# Patient Record
Sex: Male | Born: 1945 | Race: White | Hispanic: No | Marital: Single | Smoking: Never smoker
Health system: Southern US, Community
[De-identification: ages and names within clinical notes are randomized; demographics above are authoritative.]

## PROBLEM LIST (undated history)

## (undated) DIAGNOSIS — E785 Hyperlipidemia, unspecified: Secondary | ICD-10-CM

## (undated) DIAGNOSIS — I5040 Unspecified combined systolic (congestive) and diastolic (congestive) heart failure: Secondary | ICD-10-CM

## (undated) DIAGNOSIS — M1990 Unspecified osteoarthritis, unspecified site: Secondary | ICD-10-CM

## (undated) DIAGNOSIS — J189 Pneumonia, unspecified organism: Secondary | ICD-10-CM

## (undated) DIAGNOSIS — K219 Gastro-esophageal reflux disease without esophagitis: Secondary | ICD-10-CM

## (undated) HISTORY — PX: HERNIA REPAIR: SHX51

## (undated) HISTORY — DX: Hyperlipidemia, unspecified: E78.5

## (undated) HISTORY — DX: Unspecified combined systolic (congestive) and diastolic (congestive) heart failure: I50.40

## (undated) HISTORY — PX: TONSILLECTOMY: SUR1361

## (undated) HISTORY — PX: COLONOSCOPY: SHX174

---

## 2004-03-27 ENCOUNTER — Encounter: Admission: RE | Admit: 2004-03-27 | Discharge: 2004-04-26 | Payer: Self-pay | Admitting: Family Medicine

## 2004-06-07 ENCOUNTER — Ambulatory Visit (HOSPITAL_COMMUNITY): Admission: RE | Admit: 2004-06-07 | Discharge: 2004-06-07 | Payer: Self-pay | Admitting: Orthopaedic Surgery

## 2012-10-14 ENCOUNTER — Telehealth: Payer: Self-pay | Admitting: Physician Assistant

## 2012-10-15 NOTE — Telephone Encounter (Signed)
Patient wanted to review his lab results again.

## 2012-10-26 ENCOUNTER — Other Ambulatory Visit: Payer: Self-pay | Admitting: Physician Assistant

## 2012-11-18 ENCOUNTER — Other Ambulatory Visit: Payer: Self-pay | Admitting: Physician Assistant

## 2012-11-23 ENCOUNTER — Other Ambulatory Visit: Payer: Self-pay | Admitting: Physician Assistant

## 2013-04-07 ENCOUNTER — Ambulatory Visit (INDEPENDENT_AMBULATORY_CARE_PROVIDER_SITE_OTHER): Payer: Medicare Other | Admitting: Family Medicine

## 2013-04-07 ENCOUNTER — Encounter: Payer: Self-pay | Admitting: Family Medicine

## 2013-04-07 VITALS — BP 133/62 | HR 41 | Temp 97.1°F | Ht 68.0 in | Wt 186.0 lb

## 2013-04-07 DIAGNOSIS — N400 Enlarged prostate without lower urinary tract symptoms: Secondary | ICD-10-CM

## 2013-04-07 DIAGNOSIS — L259 Unspecified contact dermatitis, unspecified cause: Secondary | ICD-10-CM

## 2013-04-07 DIAGNOSIS — F329 Major depressive disorder, single episode, unspecified: Secondary | ICD-10-CM

## 2013-04-07 DIAGNOSIS — F32A Depression, unspecified: Secondary | ICD-10-CM

## 2013-04-07 DIAGNOSIS — Z23 Encounter for immunization: Secondary | ICD-10-CM

## 2013-04-07 DIAGNOSIS — F3289 Other specified depressive episodes: Secondary | ICD-10-CM

## 2013-04-07 DIAGNOSIS — E785 Hyperlipidemia, unspecified: Secondary | ICD-10-CM

## 2013-04-07 DIAGNOSIS — L309 Dermatitis, unspecified: Secondary | ICD-10-CM

## 2013-04-07 DIAGNOSIS — M129 Arthropathy, unspecified: Secondary | ICD-10-CM

## 2013-04-07 DIAGNOSIS — M1990 Unspecified osteoarthritis, unspecified site: Secondary | ICD-10-CM

## 2013-04-07 LAB — POCT CBC
Granulocyte percent: 59.6 %G (ref 37–80)
HCT, POC: 39.7 % — AB (ref 43.5–53.7)
Hemoglobin: 13.2 g/dL — AB (ref 14.1–18.1)
Lymph, poc: 1.8 (ref 0.6–3.4)
MCH, POC: 28.7 pg (ref 27–31.2)
MCHC: 33.1 g/dL (ref 31.8–35.4)
MCV: 86.7 fL (ref 80–97)
MPV: 7.4 fL (ref 0–99.8)
POC Granulocyte: 3.1 (ref 2–6.9)
POC LYMPH PERCENT: 32.4 %L (ref 10–50)
Platelet Count, POC: 149 10*3/uL (ref 142–424)
RBC: 4.6 M/uL — AB (ref 4.69–6.13)
RDW, POC: 14 %
WBC: 5.2 10*3/uL (ref 4.6–10.2)

## 2013-04-07 MED ORDER — TRAMADOL HCL 50 MG PO TABS: 50.0000 mg | ORAL_TABLET | Freq: Two times a day (BID) | ORAL | Status: DC

## 2013-04-07 MED ORDER — MELOXICAM 15 MG PO TABS: 15.0000 mg | ORAL_TABLET | Freq: Every day | ORAL | Status: DC

## 2013-04-07 MED ORDER — GABAPENTIN 300 MG PO CAPS: 300.0000 mg | ORAL_CAPSULE | Freq: Two times a day (BID) | ORAL | Status: DC

## 2013-04-07 MED ORDER — DOXAZOSIN MESYLATE 4 MG PO TABS: 4.0000 mg | ORAL_TABLET | Freq: Every day | ORAL | Status: DC

## 2013-04-07 MED ORDER — TAMSULOSIN HCL 0.4 MG PO CAPS: 0.4000 mg | ORAL_CAPSULE | Freq: Every day | ORAL | Status: DC

## 2013-04-07 MED ORDER — SERTRALINE HCL 50 MG PO TABS: 50.0000 mg | ORAL_TABLET | Freq: Every day | ORAL | Status: DC

## 2013-04-07 MED ORDER — COLESEVELAM HCL 625 MG PO TABS: 1250.0000 mg | ORAL_TABLET | Freq: Two times a day (BID) | ORAL | Status: DC

## 2013-04-07 MED ORDER — FLUTICASONE PROPIONATE 0.05 % EX CREA: TOPICAL_CREAM | Freq: Two times a day (BID) | CUTANEOUS | Status: DC

## 2013-04-07 MED ORDER — PRAVASTATIN SODIUM 40 MG PO TABS: 40.0000 mg | ORAL_TABLET | Freq: Every day | ORAL | Status: DC

## 2013-04-07 NOTE — Patient Instructions (Signed)

## 2013-04-07 NOTE — Progress Notes (Signed)
  Subjective:    Patient ID: Taylor Cross, male    DOB: 05-24-1946, 67 y.o.   MRN: [id number]  HPI This 67 y.o. male presents for evaluation of bph, hyperlipidemia, depression, arthritis, and Dermatitis.  He has no acute complaints.  He needs refills.  He is due for labs.   Review of Systems No chest pain, SOB, HA, dizziness, vision change, N/V, diarrhea, constipation, dysuria, urinary urgency or frequency, myalgias, arthralgias or rash.     Objective:   Physical Exam Vital signs noted  Well developed well nourished male.  HEENT - Head atraumatic Normocephalic                Eyes - PERRLA, Conjuctiva - clear Sclera- Clear EOMI                Ears - EAC's Wnl TM's Wnl Gross Hearing WNL                Nose - Nares patent                 Throat - oropharanx wnl Respiratory - Lungs CTA bilateral Cardiac - RRR S1 and S2 without murmur GI - Abdomen soft Nontender and bowel sounds active x 4 Extremities - No edema. Neuro - Grossly intact.       Assessment & Plan:  Dermatitis - Plan: fluticasone (CUTIVATE) 0.05 % cream  BPH (benign prostatic hyperplasia) - Plan: tamsulosin (FLOMAX) 0.4 MG CAPS capsule, doxazosin (CARDURA) 4 MG tablet  Depression - Plan: sertraline (ZOLOFT) 50 MG tablet  Arthritis - Plan: traMADol (ULTRAM) 50 MG tablet, meloxicam (MOBIC) 15 MG tablet, gabapentin (NEURONTIN) 300 MG capsule  Other and unspecified hyperlipidemia - Plan: colesevelam (WELCHOL) 625 MG tablet, pravastatin (PRAVACHOL) 40 MG tablet, POCT CBC, CMP14+EGFR, Lipid panel  Need for prophylactic vaccination against Streptococcus pneumoniae (pneumococcus) - Plan: Pneumococcal polysaccharide vaccine 23-valent greater than or equal to 2yo subcutaneous/IM

## 2013-04-08 LAB — CMP14+EGFR
ALT: 13 IU/L (ref 0–44)
AST: 15 IU/L (ref 0–40)
Albumin/Globulin Ratio: 2.3 (ref 1.1–2.5)
Albumin: 4.5 g/dL (ref 3.6–4.8)
Alkaline Phosphatase: 71 IU/L (ref 39–117)
BUN/Creatinine Ratio: 20 (ref 10–22)
BUN: 20 mg/dL (ref 8–27)
CO2: 22 mmol/L (ref 18–29)
Calcium: 9.3 mg/dL (ref 8.6–10.2)
Chloride: 102 mmol/L (ref 97–108)
Creatinine, Ser: 0.98 mg/dL (ref 0.76–1.27)
GFR calc Af Amer: 92 mL/min/{1.73_m2} (ref >59)
GFR calc non Af Amer: 79 mL/min/{1.73_m2} (ref >59)
Globulin, Total: 2 g/dL (ref 1.5–4.5)
Glucose: 94 mg/dL (ref 65–99)
Potassium: 4.5 mmol/L (ref 3.5–5.2)
Sodium: 140 mmol/L (ref 134–144)
Total Bilirubin: 0.4 mg/dL (ref 0.0–1.2)
Total Protein: 6.5 g/dL (ref 6.0–8.5)

## 2013-04-08 LAB — LIPID PANEL
Chol/HDL Ratio: 2.7 ratio units (ref 0.0–5.0)
Cholesterol, Total: 153 mg/dL (ref 100–199)
HDL: 57 mg/dL (ref >39)
LDL Calculated: 80 mg/dL (ref 0–99)
Triglycerides: 79 mg/dL (ref 0–149)
VLDL Cholesterol Cal: 16 mg/dL (ref 5–40)

## 2013-04-15 ENCOUNTER — Telehealth: Payer: Self-pay | Admitting: Family Medicine

## 2013-04-15 NOTE — Telephone Encounter (Signed)
Several episodes of scant urination during the night.   He does feel that his bladder is full but he can't void. Has history of enlarged prostate. Currently on Septra for cellulitis from spider bite.  He prescribed this at [facility]. He questioned whether he should go to ED or come into the office.   Infection is unlikely since he is taking Septra but still possible. May need catheterization to drain bladder. Hospital is probably best since he hasn't voided normally since yesterday. Patient is agreeable to this.

## 2013-04-17 ENCOUNTER — Telehealth: Payer: Self-pay | Admitting: Family Medicine

## 2013-04-17 ENCOUNTER — Other Ambulatory Visit: Payer: Self-pay | Admitting: Family Medicine

## 2013-04-17 DIAGNOSIS — N400 Enlarged prostate without lower urinary tract symptoms: Secondary | ICD-10-CM

## 2013-04-17 MED ORDER — TAMSULOSIN HCL 0.4 MG PO CAPS: 0.8000 mg | ORAL_CAPSULE | Freq: Every day | ORAL | Status: DC

## 2013-04-17 NOTE — Telephone Encounter (Signed)
Pt requesting new rx of Tamsulosin. He wants to take it twice daily.

## 2013-04-17 NOTE — Telephone Encounter (Signed)
Bill please review and advise 

## 2013-04-17 NOTE — Telephone Encounter (Signed)
rx sent to pharmacy

## 2013-04-17 NOTE — Telephone Encounter (Signed)
done

## 2013-04-20 NOTE — Telephone Encounter (Signed)
Pt aware.

## 2013-04-30 ENCOUNTER — Ambulatory Visit (INDEPENDENT_AMBULATORY_CARE_PROVIDER_SITE_OTHER): Payer: Medicare Other | Admitting: Family Medicine

## 2013-04-30 VITALS — BP 146/69 | HR 42 | Temp 97.1°F | Wt 189.4 lb

## 2013-04-30 DIAGNOSIS — L0390 Cellulitis, unspecified: Secondary | ICD-10-CM

## 2013-04-30 DIAGNOSIS — L0291 Cutaneous abscess, unspecified: Secondary | ICD-10-CM

## 2013-04-30 MED ORDER — CEPHALEXIN 500 MG PO CAPS: 500.0000 mg | ORAL_CAPSULE | Freq: Four times a day (QID) | ORAL | Status: DC

## 2013-04-30 NOTE — Patient Instructions (Addendum)
Cellulitis Cellulitis is an infection of the skin and the tissue beneath it. The infected area is usually red and tender. Cellulitis occurs most often in the arms and lower legs.  CAUSES  Cellulitis is caused by bacteria that enter the skin through cracks or cuts in the skin. The most common types of bacteria that cause cellulitis are Staphylococcus and Streptococcus. SYMPTOMS   Redness and warmth.  Swelling.  Tenderness or pain.  Fever. DIAGNOSIS  Your caregiver can usually determine what is wrong based on a physical exam. Blood tests may also be done. TREATMENT  Treatment usually involves taking an antibiotic medicine. HOME CARE INSTRUCTIONS   Take your antibiotics as directed. Finish them even if you start to feel better.  Keep the infected arm or leg elevated to reduce swelling.  Apply a warm cloth to the affected area up to 4 times per day to relieve pain.  Only take over-the-counter or prescription medicines for pain, discomfort, or fever as directed by your caregiver.  Keep all follow-up appointments as directed by your caregiver. SEEK MEDICAL CARE IF:   You notice red streaks coming from the infected area.  Your red area gets larger or turns dark in color.  Your bone or joint underneath the infected area becomes painful after the skin has healed.  Your infection returns in the same area or another area.  You notice a swollen bump in the infected area.  You develop new symptoms. SEEK IMMEDIATE MEDICAL CARE IF:   You have a fever.  You feel very sleepy.  You develop vomiting or diarrhea.  You have a general ill feeling (malaise) with muscle aches and pains. MAKE SURE YOU:   Understand these instructions.  Will watch your condition.  Will get help right away if you are not doing well or get worse. Document Released: 04/25/2005 Document Revised: 01/15/2012 Document Reviewed: 10/01/2011 [employer] Patient Information 2014 [employer].  

## 2013-04-30 NOTE — Progress Notes (Signed)
  Subjective:    Patient ID: GLYNDON TURSI, male    DOB: 06-27-1946, 67 y.o.   MRN: [id number]  HPI This 67 y.o. male presents for evaluation of sore on left lateral elbow that is not healing. He states it itches and he does scratch it.  It is getting red.   Review of Systems C/o left elbow wound No chest pain, SOB, HA, dizziness, vision change, N/V, diarrhea, constipation, dysuria, urinary urgency or frequency, myalgias, arthralgias or rash.     Objective:   Physical Exam  Vital signs noted  Well developed well nourished male.  HEENT - Head atraumatic Normocephalic                Eyes - PERRLA, Conjuctiva - clear Sclera- Clear EOMI                Throat - oropharanx wnl Respiratory - Lungs CTA bilateral Cardiac - RRR S1 and S2 without murmur Skin - Left lateral elbow with area approx 1cm diameter with erythematous border     Assessment & Plan:  Cellulitis - Plan: cephALEXin (KEFLEX) 500 MG capsule po qid x 10 days #40

## 2013-05-07 ENCOUNTER — Telehealth: Payer: Self-pay

## 2013-05-07 NOTE — Telephone Encounter (Signed)
Wants a referral for prostate problems to urology in [facility]

## 2013-05-08 ENCOUNTER — Other Ambulatory Visit: Payer: Self-pay | Admitting: Family Medicine

## 2013-05-08 DIAGNOSIS — N400 Enlarged prostate without lower urinary tract symptoms: Secondary | ICD-10-CM

## 2013-05-12 ENCOUNTER — Telehealth: Payer: Self-pay | Admitting: Family Medicine

## 2013-05-13 NOTE — Telephone Encounter (Signed)
Can he come in and be seen and there may be something I can do instead of referal ?  He should be seen  Anytime he thinks he needs a referral for documentation and some specialist need certain tests done first Prior to referral.

## 2013-05-15 ENCOUNTER — Telehealth: Payer: Self-pay | Admitting: Family Medicine

## 2013-05-20 NOTE — Telephone Encounter (Signed)
Patient scheduled his own urology appt and has been seen by Dr. Andrey Campanile.

## 2013-09-21 ENCOUNTER — Ambulatory Visit (INDEPENDENT_AMBULATORY_CARE_PROVIDER_SITE_OTHER): Payer: Medicare Other | Admitting: Family Medicine

## 2013-09-21 ENCOUNTER — Telehealth: Payer: Self-pay | Admitting: Family Medicine

## 2013-09-21 VITALS — BP 142/80 | HR 67 | Temp 99.3°F | Wt 191.9 lb

## 2013-09-21 DIAGNOSIS — L259 Unspecified contact dermatitis, unspecified cause: Secondary | ICD-10-CM

## 2013-09-21 DIAGNOSIS — Z9109 Other allergy status, other than to drugs and biological substances: Secondary | ICD-10-CM

## 2013-09-21 DIAGNOSIS — J069 Acute upper respiratory infection, unspecified: Secondary | ICD-10-CM

## 2013-09-21 DIAGNOSIS — L309 Dermatitis, unspecified: Secondary | ICD-10-CM

## 2013-09-21 MED ORDER — AMOXICILLIN 875 MG PO TABS: 875.0000 mg | ORAL_TABLET | Freq: Two times a day (BID) | ORAL | Status: DC

## 2013-09-21 MED ORDER — METHYLPREDNISOLONE (PAK) 4 MG PO TABS: ORAL_TABLET | ORAL | Status: DC

## 2013-09-21 MED ORDER — CLOBETASOL PROP EMOLLIENT BASE 0.05 % EX CREA: 1.0000 | TOPICAL_CREAM | Freq: Two times a day (BID) | CUTANEOUS | Status: DC

## 2013-09-21 MED ORDER — FLUTICASONE PROPIONATE 50 MCG/ACT NA SUSP: 2.0000 | Freq: Every day | NASAL | Status: DC

## 2013-09-21 NOTE — Progress Notes (Signed)
   Subjective:    Patient ID: Taylor Cross, male    DOB: Jun 03, 1946, 68 y.o.   MRN: [id number]  HPI  This 68 y.o. male presents for evaluation of URI.  He c/o cough and congestion. He c/o allergies.  He wants refill on his nasal steroid spray.  He has been feeling Washed out.  He has a rash on his right lower leg.  Review of Systems C/o rash and uri sx's   No chest pain, SOB, HA, dizziness, vision change, N/V, diarrhea, constipation, dysuria, urinary urgency or frequency, myalgias, arthralgias.  Objective:   Physical Exam Vital signs noted  Well developed well nourished male.  HEENT - Head atraumatic Normocephalic                Eyes - PERRLA, Conjuctiva - clear Sclera- Clear EOMI                Ears - EAC's Wnl TM's Wnl Gross Hearing WNL                Throat - oropharanx wnl Respiratory - Lungs CTA bilateral Cardiac - RRR S1 and S2 without murmur GI - Abdomen soft Nontender and bowel sounds active x 4 Skin - Erythematous rash on right leg      Assessment & Plan:  URI, acute - Plan: methylPREDNIsolone (MEDROL DOSPACK) 4 MG tablet, amoxicillin (AMOXIL) 875 MG tablet po bid x 10 days. Push po fluids, rest, tylenol and motrin otc prn as directed for fever, arthralgias, and myalgias.  Follow up prn if sx's continue or persist.  Environmental allergies - Plan: fluticasone (FLONASE) 50 MCG/ACT nasal spray  Dermatitis - Plan: Clobetasol Prop Emollient Base (CLOBETASOL PROPIONATE E) 0.05 % emollient cream.  Deatra Canter FNP

## 2013-09-21 NOTE — Patient Instructions (Signed)

## 2013-09-21 NOTE — Telephone Encounter (Signed)
Appt given for today per patients request 

## 2013-10-06 ENCOUNTER — Ambulatory Visit (INDEPENDENT_AMBULATORY_CARE_PROVIDER_SITE_OTHER): Payer: Medicare Other | Admitting: Family Medicine

## 2013-10-06 ENCOUNTER — Ambulatory Visit (INDEPENDENT_AMBULATORY_CARE_PROVIDER_SITE_OTHER): Payer: Medicare Other

## 2013-10-06 VITALS — BP 139/72 | HR 56 | Temp 96.6°F | Ht 68.0 in | Wt 190.0 lb

## 2013-10-06 DIAGNOSIS — R0602 Shortness of breath: Secondary | ICD-10-CM

## 2013-10-06 DIAGNOSIS — I4949 Other premature depolarization: Secondary | ICD-10-CM

## 2013-10-06 DIAGNOSIS — I493 Ventricular premature depolarization: Secondary | ICD-10-CM

## 2013-10-06 DIAGNOSIS — M129 Arthropathy, unspecified: Secondary | ICD-10-CM

## 2013-10-06 DIAGNOSIS — Z23 Encounter for immunization: Secondary | ICD-10-CM

## 2013-10-06 DIAGNOSIS — M1990 Unspecified osteoarthritis, unspecified site: Secondary | ICD-10-CM

## 2013-10-06 DIAGNOSIS — E785 Hyperlipidemia, unspecified: Secondary | ICD-10-CM

## 2013-10-06 LAB — POCT CBC
Granulocyte percent: 67.7 %G (ref 37–80)
HCT, POC: 42.1 % — AB (ref 43.5–53.7)
Hemoglobin: 13 g/dL — AB (ref 14.1–18.1)
Lymph, poc: 2.1 (ref 0.6–3.4)
MCH, POC: 27.6 pg (ref 27–31.2)
MCHC: 31 g/dL — AB (ref 31.8–35.4)
MCV: 89.2 fL (ref 80–97)
MPV: 8.9 fL (ref 0–99.8)
POC Granulocyte: 4.9 (ref 2–6.9)
POC LYMPH PERCENT: 28.9 %L (ref 10–50)
Platelet Count, POC: 163 10*3/uL (ref 142–424)
RBC: 4.7 M/uL (ref 4.69–6.13)
RDW, POC: 16.4 %
WBC: 7.3 10*3/uL (ref 4.6–10.2)

## 2013-10-06 MED ORDER — TRAMADOL HCL 50 MG PO TABS: 50.0000 mg | ORAL_TABLET | Freq: Two times a day (BID) | ORAL | Status: DC

## 2013-10-06 NOTE — Progress Notes (Signed)
   Subjective:    Patient ID: Taylor Cross, male    DOB: [birth year], 68 y.o.   MRN: [id number]  HPI This 68 y.o. male presents for evaluation of routine follow up.  He states he gets SOB on occasion. He sees urology for bph.  He gets PSA drawn by urology.  He has hx of hyperlipidemia, depression, And arthritis.  He has been having episodes of shortness of breath he states.  He has family hx Of CAD.  His mother had MI at age 85.  He states he was using wheelbarrow and was very SOB. He denies SOB at present.   Review of Systems C/o SOB   No chest pain, HA, dizziness, vision change, N/V, diarrhea, constipation, dysuria, urinary urgency or frequency, myalgias, arthralgias or rash.  Objective:   Physical Exam  Vital signs noted  Well developed well nourished male.  HEENT - Head atraumatic Normocephalic                Eyes - PERRLA, Conjuctiva - clear Sclera- Clear EOMI                Ears - EAC's Wnl TM's Wnl Gross Hearing WNL                Throat - oropharanx wnl Respiratory - Lungs CTA bilateral Cardiac - Irregular rate and rhythm S1 and S2 without murmur GI - Abdomen soft Nontender and bowel sounds active x 4 Extremities - No edema. Neuro - Grossly intact.  EKG - NSR with ventricular bigemany    Assessment & Plan:  SOB (shortness of breath) - Plan: EKG 12-Lead, DG Chest 2 View  Arthritis - Plan: traMADol (ULTRAM) 50 MG tablet  PVC's (premature ventricular contractions) - Plan: Ambulatory referral to Cardiology  Discussed with patient he needs to see cardiology ASAP and if develops SOB or chest discomfort Go to ED.  Discussed with patient he needs to take ASA 325mg  po qd.  Deatra Canter FNP

## 2013-10-07 LAB — CMP14+EGFR
ALT: 13 IU/L (ref 0–44)
AST: 16 IU/L (ref 0–40)
Albumin/Globulin Ratio: 1.9 (ref 1.1–2.5)
Albumin: 4.4 g/dL (ref 3.6–4.8)
Alkaline Phosphatase: 69 IU/L (ref 39–117)
BUN/Creatinine Ratio: 24 — ABNORMAL HIGH (ref 10–22)
BUN: 25 mg/dL (ref 8–27)
CO2: 23 mmol/L (ref 18–29)
Calcium: 9.2 mg/dL (ref 8.6–10.2)
Chloride: 104 mmol/L (ref 97–108)
Creatinine, Ser: 1.03 mg/dL (ref 0.76–1.27)
GFR calc Af Amer: 86 mL/min/{1.73_m2} (ref >59)
GFR calc non Af Amer: 75 mL/min/{1.73_m2} (ref >59)
Globulin, Total: 2.3 g/dL (ref 1.5–4.5)
Glucose: 112 mg/dL — ABNORMAL HIGH (ref 65–99)
Potassium: 4.5 mmol/L (ref 3.5–5.2)
Sodium: 143 mmol/L (ref 134–144)
Total Bilirubin: 0.4 mg/dL (ref 0.0–1.2)
Total Protein: 6.7 g/dL (ref 6.0–8.5)

## 2013-10-07 LAB — LIPID PANEL
Chol/HDL Ratio: 3 ratio units (ref 0.0–5.0)
Cholesterol, Total: 144 mg/dL (ref 100–199)
HDL: 48 mg/dL (ref >39)
LDL Calculated: 74 mg/dL (ref 0–99)
Triglycerides: 108 mg/dL (ref 0–149)
VLDL Cholesterol Cal: 22 mg/dL (ref 5–40)

## 2013-10-07 LAB — THYROID PANEL WITH TSH
Free Thyroxine Index: 1.7 (ref 1.2–4.9)
T3 Uptake Ratio: 27 % (ref 24–39)
T4, Total: 6.4 ug/dL (ref 4.5–12.0)
TSH: 1.91 u[IU]/mL (ref 0.450–4.500)

## 2013-10-08 ENCOUNTER — Telehealth: Payer: Self-pay | Admitting: Family Medicine

## 2013-10-09 NOTE — Telephone Encounter (Signed)
Small pleural effusion- did he give patient antibiotic

## 2013-10-09 NOTE — Telephone Encounter (Signed)
Called to check on patient but there was no answer. Left message for him to return my call.

## 2013-10-09 NOTE — Telephone Encounter (Signed)
Patient requesting result of CXR. These have not been signed off on but do show a left pleural effusion. It doesn't appear that he was placed on antibiotics. Bill won't be back until Monday and I didn't want this to wait that long. Will you address it?

## 2013-10-20 ENCOUNTER — Ambulatory Visit (INDEPENDENT_AMBULATORY_CARE_PROVIDER_SITE_OTHER): Payer: Medicare Other | Admitting: Cardiology

## 2013-10-20 ENCOUNTER — Encounter: Payer: Self-pay | Admitting: Cardiology

## 2013-10-20 VITALS — BP 149/88 | HR 94 | Ht 68.0 in | Wt 186.0 lb

## 2013-10-20 DIAGNOSIS — E785 Hyperlipidemia, unspecified: Secondary | ICD-10-CM

## 2013-10-20 DIAGNOSIS — I4949 Other premature depolarization: Secondary | ICD-10-CM

## 2013-10-20 DIAGNOSIS — E782 Mixed hyperlipidemia: Secondary | ICD-10-CM | POA: Insufficient documentation

## 2013-10-20 DIAGNOSIS — I493 Ventricular premature depolarization: Secondary | ICD-10-CM

## 2013-10-20 DIAGNOSIS — R0600 Dyspnea, unspecified: Secondary | ICD-10-CM

## 2013-10-20 DIAGNOSIS — R0989 Other specified symptoms and signs involving the circulatory and respiratory systems: Secondary | ICD-10-CM

## 2013-10-20 DIAGNOSIS — R0609 Other forms of dyspnea: Secondary | ICD-10-CM

## 2013-10-20 NOTE — Progress Notes (Signed)
Clinical Summary Taylor Cross is a 68 y.o.male seen today as a new patient, he is referred for shortness of breath.   1. SOB - started "couple of years ago", he is unsure of exact duration. Occurs with exertion. Reports DOE with walking a few blocks. No chest pain, no palpitations, N/V, no diaphoresis. Notes symptoms have worsened over this winter.  - no LE edema, no orthopnea, no PND  CAD risk factors: mother MI age 68, maternal grandfather MI age 68. Hyperlipidemia.    2. Hyperlipidemia - compliant with pravastatin - panel 09/2013 TC 144 TG 108 HDL 48 LDL 74  3. PVCs - noted on recent EKG at pcp's office - denies any palpitations, no syncope or presyncope  - K 4.5  No past medical history on file.   Allergies  Allergen Reactions  . Neosporin [Neomycin-Bacitracin Zn-Polymyx] Rash     Current Outpatient Prescriptions  Medication Sig Dispense Refill  . aspirin 81 MG chewable tablet Chew 81 mg by mouth daily.      . Clobetasol Prop Emollient Base (CLOBETASOL PROPIONATE E) 0.05 % emollient cream Apply 1 Tube topically 2 (two) times daily.  60 g  3  . colesevelam (WELCHOL) 625 MG tablet Take 2 tablets (1,250 mg total) by mouth 2 (two) times daily with a meal.  120 tablet  11  . doxazosin (CARDURA) 4 MG tablet Take 1 tablet (4 mg total) by mouth daily.  30 tablet  11  . fluticasone (CUTIVATE) 0.05 % cream Apply topically 2 (two) times daily.  15 g  5  . fluticasone (FLONASE) 50 MCG/ACT nasal spray Place 2 sprays into both nostrils daily.  16 g  6  . gabapentin (NEURONTIN) 300 MG capsule Take 1 capsule (300 mg total) by mouth 2 (two) times daily.  60 capsule  11  . hydroquinone 4 % cream Apply 1 application topically 2 (two) times daily.      . meloxicam (MOBIC) 15 MG tablet Take 1 tablet (15 mg total) by mouth daily.  30 tablet  11  . pravastatin (PRAVACHOL) 40 MG tablet Take 1 tablet (40 mg total) by mouth daily.  30 tablet  11  . sertraline (ZOLOFT) 50 MG tablet Take 1  tablet (50 mg total) by mouth daily.  30 tablet  11  . tamsulosin (FLOMAX) 0.4 MG CAPS capsule Take 2 capsules (0.8 mg total) by mouth daily.  60 capsule  11  . traMADol (ULTRAM) 50 MG tablet Take 1 tablet (50 mg total) by mouth 2 (two) times daily.  60 tablet  5   No current facility-administered medications for this visit.     No past surgical history on file.   Allergies  Allergen Reactions  . Neosporin [Neomycin-Bacitracin Zn-Polymyx] Rash      Family History  Problem Relation Age of Onset  . Heart disease Mother   . Diabetes Mother   . COPD Father      Social History Taylor Cross reports that he has never smoked. He does not have any smokeless tobacco history on file. Taylor Cross reports that he does not drink alcohol.   Review of Systems CONSTITUTIONAL: No weight loss, fever, chills, weakness or fatigue.  HEENT: Eyes: No visual loss, blurred vision, double vision or yellow sclerae.No hearing loss, sneezing, congestion, runny nose or sore throat.  SKIN: No rash or itching.  CARDIOVASCULAR: per HPI RESPIRATORY: SOB.  GASTROINTESTINAL: No anorexia, nausea, vomiting or diarrhea. No abdominal pain or blood.  GENITOURINARY: No  burning on urination, no polyuria NEUROLOGICAL: No headache, dizziness, syncope, paralysis, ataxia, numbness or tingling in the extremities. No change in bowel or bladder control.  MUSCULOSKELETAL: No muscle, back pain, joint pain or stiffness.  LYMPHATICS: No enlarged nodes. No history of splenectomy.  PSYCHIATRIC: No history of depression or anxiety.  ENDOCRINOLOGIC: No reports of sweating, cold or heat intolerance. No polyuria or polydipsia.  Marland Kitchen   Physical Examination p 94 bp 149/88 Wt 186 lbs BMI 28  Gen: resting comfortably, no acute distress HEENT: no scleral icterus, pupils equal round and reactive, no palptable cervical adenopathy,  CV: RRR, no m/r/g, no JVD, no carotid bruits Resp: Clear to auscultation bilaterally GI: abdomen is soft,  non-tender, non-distended, normal bowel sounds, no hepatosplenomegaly MSK: extremities are warm, no edema.  Skin: warm, no rash Neuro:  no focal deficits Psych: appropriate affect    Assessment and Plan  1. SOB/DOE - symptoms for several years that have progressed recently. Unclear etiology at this time, he does have risk factors for cardiac disease and PVCs on baseline EKG - will obtain echo to evaluate for structural heart disease, consider ischemic evaluation pending those results  2. Hyperlipidemia - at goal, continue current statin.  3. PVCs - denies any symptoms - echo to evaluate for structural heart disease   Follow up pending echo results.    Taylor Cross, M.D., F.A.C.C.

## 2013-10-20 NOTE — Patient Instructions (Signed)
Your physician has requested that you have an echocardiogram. Echocardiography is a painless test that uses sound waves to create images of your heart. It provides your doctor with information about the size and shape of your heart and how well your heart's chambers and valves are working. This procedure takes approximately one hour. There are no restrictions for this procedure.  Your physician recommends that you continue on your current medications as directed. Please refer to the Current Medication list given to you today.  

## 2013-10-28 ENCOUNTER — Other Ambulatory Visit: Payer: Self-pay | Admitting: Family Medicine

## 2013-10-28 DIAGNOSIS — J90 Pleural effusion, not elsewhere classified: Secondary | ICD-10-CM

## 2013-10-28 NOTE — Telephone Encounter (Signed)
cxr shows small left pleural effusion and need to get CT of chest and orders put inp

## 2013-10-28 NOTE — Telephone Encounter (Signed)
Patient continues to have cough. It hasn't worsened. Does he need to be treated with antibiotics for pleural effusion?

## 2013-10-28 NOTE — Telephone Encounter (Signed)
Patient notified.  He will let us know if he develops a fever or productive cough.

## 2013-11-04 ENCOUNTER — Other Ambulatory Visit (INDEPENDENT_AMBULATORY_CARE_PROVIDER_SITE_OTHER): Payer: Medicare Other

## 2013-11-04 ENCOUNTER — Other Ambulatory Visit: Payer: Self-pay

## 2013-11-04 DIAGNOSIS — I059 Rheumatic mitral valve disease, unspecified: Secondary | ICD-10-CM

## 2013-11-04 DIAGNOSIS — R0600 Dyspnea, unspecified: Secondary | ICD-10-CM

## 2013-11-04 DIAGNOSIS — R0989 Other specified symptoms and signs involving the circulatory and respiratory systems: Secondary | ICD-10-CM

## 2013-11-04 DIAGNOSIS — R0609 Other forms of dyspnea: Secondary | ICD-10-CM

## 2013-11-05 ENCOUNTER — Ambulatory Visit (HOSPITAL_COMMUNITY)
Admission: RE | Admit: 2013-11-05 | Discharge: 2013-11-05 | Disposition: A | Discharge status: Home / Self Care | Payer: Medicare Other | Source: Ambulatory Visit | Attending: Family Medicine | Admitting: Family Medicine

## 2013-11-05 ENCOUNTER — Other Ambulatory Visit: Payer: Self-pay | Admitting: Family Medicine

## 2013-11-05 DIAGNOSIS — J438 Other emphysema: Secondary | ICD-10-CM | POA: Insufficient documentation

## 2013-11-05 DIAGNOSIS — E7800 Pure hypercholesterolemia, unspecified: Secondary | ICD-10-CM | POA: Insufficient documentation

## 2013-11-05 DIAGNOSIS — R0602 Shortness of breath: Secondary | ICD-10-CM

## 2013-11-05 DIAGNOSIS — J984 Other disorders of lung: Secondary | ICD-10-CM | POA: Insufficient documentation

## 2013-11-05 DIAGNOSIS — J90 Pleural effusion, not elsewhere classified: Secondary | ICD-10-CM | POA: Insufficient documentation

## 2013-11-05 MED ORDER — IOHEXOL 300 MG/ML  SOLN
80.0000 mL | Freq: Once | INTRAMUSCULAR | Status: AC | PRN
  Administered 2013-11-05: 80 mL via INTRAVENOUS

## 2013-11-06 ENCOUNTER — Telehealth: Payer: Self-pay | Admitting: Family Medicine

## 2013-11-09 NOTE — Telephone Encounter (Signed)
error 

## 2013-11-10 ENCOUNTER — Telehealth: Payer: Self-pay | Admitting: Cardiology

## 2013-11-10 NOTE — Telephone Encounter (Signed)
Pt informed of results and offered an appointment for Wednesday 4-15 @ 3:00. Pt accepted appt and verbalized understanding. Pt stated that since he was seen he has had a breathing test completed and that he has been told the he has emphysema and he believes this is where his problems are originating from.

## 2013-11-10 NOTE — Telephone Encounter (Signed)
Message copied by Burnice Logan on Tue Nov 10, 2013  1:40 PM ------      Message from: [place] F      Created: Mon Nov 09, 2013 11:27 AM       Please let patient know that his echo shows that the pumping function of his heart is mildly decreased, and his heart is moderately stiffened as well. We need to discuss further in detail at follow up, can we add him on for this week?                  Dina Rich MD ------

## 2013-11-11 ENCOUNTER — Encounter: Payer: Self-pay | Admitting: Cardiology

## 2013-11-11 ENCOUNTER — Ambulatory Visit (INDEPENDENT_AMBULATORY_CARE_PROVIDER_SITE_OTHER): Payer: Medicare Other | Admitting: Cardiology

## 2013-11-11 VITALS — BP 133/82 | HR 89 | Ht 68.0 in | Wt 188.4 lb

## 2013-11-11 DIAGNOSIS — Z79899 Other long term (current) drug therapy: Secondary | ICD-10-CM

## 2013-11-11 DIAGNOSIS — I5189 Other ill-defined heart diseases: Secondary | ICD-10-CM

## 2013-11-11 DIAGNOSIS — I519 Heart disease, unspecified: Secondary | ICD-10-CM | POA: Insufficient documentation

## 2013-11-11 DIAGNOSIS — I509 Heart failure, unspecified: Secondary | ICD-10-CM

## 2013-11-11 MED ORDER — LISINOPRIL 5 MG PO TABS: 5.0000 mg | ORAL_TABLET | Freq: Every day | ORAL | Status: DC

## 2013-11-11 MED ORDER — CARVEDILOL 3.125 MG PO TABS: 3.1250 mg | ORAL_TABLET | Freq: Two times a day (BID) | ORAL | Status: DC

## 2013-11-11 NOTE — Progress Notes (Signed)
Clinical Summary Mr. Bunt is a 68 y.o.male seen today for follow up of the following medical problems.   1. Systolic/Diastolic dysfunction - started "couple of years ago", he is unsure of exact duration. Occurs with exertion. Reports DOE with walking a few blocks. No chest pain, no palpitations, no orthopnea, no PND  - since last visit he completed an echo which showed LVEF 40-45%, mid-inferolateral wall hypokinesis, and grade II diastolic dysfunction.   2. Hyperlipidemia  - compliant with pravastatin  - panel 09/2013 TC 144 TG 108 HDL 48 LDL 74   3. PVCs  - noted on recent EKG at pcp's office  - denies any palpitations, no syncope or presyncope  No past medical history on file.   Allergies  Allergen Reactions  . Neosporin [Neomycin-Bacitracin Zn-Polymyx] Rash     Current Outpatient Prescriptions  Medication Sig Dispense Refill  . aspirin 81 MG chewable tablet Chew 81 mg by mouth daily.      . clindamycin (CLINDAGEL) 1 % gel Apply 1 application topically 2 (two) times daily as needed.      . Clobetasol Prop Emollient Base (CLOBETASOL PROPIONATE E) 0.05 % emollient cream Apply 1 Tube topically 2 (two) times daily.  60 g  3  . colesevelam (WELCHOL) 625 MG tablet Take 2 tablets (1,250 mg total) by mouth 2 (two) times daily with a meal.  120 tablet  11  . doxazosin (CARDURA) 4 MG tablet Take 1 tablet (4 mg total) by mouth daily.  30 tablet  11  . finasteride (PROSCAR) 5 MG tablet Take 1 tablet by mouth daily.      . fluticasone (CUTIVATE) 0.05 % cream Apply topically 2 (two) times daily.  15 g  5  . gabapentin (NEURONTIN) 300 MG capsule Take 1 capsule (300 mg total) by mouth 2 (two) times daily.  60 capsule  11  . hydroquinone 4 % cream Apply 1 application topically 2 (two) times daily.      . meloxicam (MOBIC) 15 MG tablet Take 1 tablet (15 mg total) by mouth daily.  30 tablet  11  . pravastatin (PRAVACHOL) 40 MG tablet Take 1 tablet (40 mg total) by mouth daily.  30 tablet   11  . retapamulin (ALTABAX) 1 % ointment Apply 1 application topically 2 (two) times daily as needed.      . sertraline (ZOLOFT) 50 MG tablet Take 1 tablet (50 mg total) by mouth daily.  30 tablet  11  . tamsulosin (FLOMAX) 0.4 MG CAPS capsule Take 2 capsules (0.8 mg total) by mouth daily.  60 capsule  11  . traMADol (ULTRAM) 50 MG tablet Take 100 mg by mouth daily.       No current facility-administered medications for this visit.     No past surgical history on file.   Allergies  Allergen Reactions  . Neosporin [Neomycin-Bacitracin Zn-Polymyx] Rash      Family History  Problem Relation Age of Onset  . Heart disease Mother   . Diabetes Mother   . COPD Father      Social History Mr. Ludke reports that he has never smoked. He has never used smokeless tobacco. [name] reports that he does not drink alcohol.   Review of Systems CONSTITUTIONAL: No weight loss, fever, chills, weakness or fatigue.  HEENT: Eyes: No visual loss, blurred vision, double vision or yellow sclerae.No hearing loss, sneezing, congestion, runny nose or sore throat.  SKIN: No rash or itching.  CARDIOVASCULAR: per HPI  RESPIRATORY: No cough or sputum.  GASTROINTESTINAL: No anorexia, nausea, vomiting or diarrhea. No abdominal pain or blood.  GENITOURINARY: No burning on urination, no polyuria NEUROLOGICAL: No headache, dizziness, syncope, paralysis, ataxia, numbness or tingling in the extremities. No change in bowel or bladder control.  MUSCULOSKELETAL: No muscle, back pain, joint pain or stiffness.  LYMPHATICS: No enlarged nodes. No history of splenectomy.  PSYCHIATRIC: No history of depression or anxiety.  ENDOCRINOLOGIC: No reports of sweating, cold or heat intolerance. No polyuria or polydipsia.  Marland Kitchen   Physical Examination p 89 bp 133/82 Wt 188 lbs BMI 29 Gen: resting comfortably, no acute distress HEENT: no scleral icterus, pupils equal round and reactive, no palptable cervical adenopathy,    CV: RRR, no m/r/g, no JVD, no carotid bruits Resp: Clear to auscultation bilaterally GI: abdomen is soft, non-tender, non-distended, normal bowel sounds, no hepatosplenomegaly MSK: extremities are warm, no edema.  Skin: warm, no rash Neuro:  no focal deficits Psych: appropriate affect   Diagnostic Studies 10/2013 Echo Left ventricle: The cavity size was mildly dilated. Wall thickness was normal. Systolic function was mildly to moderately reduced. The estimated ejection fraction is 40%. Features are consistent with a pseudonormal left ventricular filling pattern, with concomitant abnormal relaxation and increased filling pressure (grade 2 diastolic dysfunction). Doppler parameters are consistent with high ventricular filling pressure. - Regional wall motion abnormality: Mild to moderate hypokinesis of the mid inferolateral myocardium. The remaining walls are mildly hypokinetic. - Aortic valve: Trileaflet; mildly thickened, mildly calcified leaflets. There was no stenosis. - Mitral valve: Mildly thickened leaflets . Mild to moderate regurgitation. - Left atrium: The atrium was moderately dilated. - Tricuspid valve: Mild regurgitation. Inadequate TR jet to accurately assess pulmonary pressures. - Pericardium, extracardiac: There was a left pleural effusion.      Assessment and Plan  1. Systolic/diastolic dysfunction - new diagnosis for patient, noted on recent echo - mild LV systolic dysfunction with described WMA, suggestive of ischemic cardiomyopathy.  - discussed options for ischemic evaluation, he is not in favor of cath at this time. Will obtain lexiscan - start coreg 3.125mg  bid and lisinopril 5mg  daily, with BMET in 2 weeks.    2. Hyperlipidemia  - at goal, continue current statin.   3. PVCs  - denies any symptoms  - initiate beta blocker as described above, f/u Lexiscan for possible ischemia       Antoine Poche, M.D., F.A.C.C.

## 2013-11-11 NOTE — Patient Instructions (Signed)
Your physician recommends that you schedule a follow-up appointment in: 1 month with Dr. Wyline Mood. This appointment will be scheduled today before you leave.   Your physician has recommended you make the following change in your medication:  Start: Carvedilol (Coreg) 3.125 MG take 1 tablet by mouth twice daily Start: Lisinopril 5 MG take 1 tablet by mouth once daily  Continue all other medications the same.   Your physician recommends that you return for lab work in: 2 weeks (By 11-25-13) for [employer] [facility] may go to one of the following locations to have lab work completed: [facility] [address] Dr. Lysbeth Galas office in [facility] Or [facility].   Your physician has requested that you have a lexiscan myoview. For further information please visit [email or web address] Please follow instruction sheet, as given.

## 2013-11-16 ENCOUNTER — Other Ambulatory Visit: Payer: Self-pay | Admitting: Family Medicine

## 2013-11-25 ENCOUNTER — Other Ambulatory Visit (INDEPENDENT_AMBULATORY_CARE_PROVIDER_SITE_OTHER): Payer: Medicare Other

## 2013-11-25 DIAGNOSIS — I509 Heart failure, unspecified: Secondary | ICD-10-CM

## 2013-11-26 LAB — BMP8+EGFR
BUN/Creatinine Ratio: 24 — ABNORMAL HIGH (ref 10–22)
BUN: 24 mg/dL (ref 8–27)
CHLORIDE: 102 mmol/L (ref 97–108)
CO2: 22 mmol/L (ref 18–29)
Calcium: 9.2 mg/dL (ref 8.6–10.2)
Creatinine, Ser: 1 mg/dL (ref 0.76–1.27)
GFR calc Af Amer: 89 mL/min/{1.73_m2} (ref >59)
GFR calc non Af Amer: 77 mL/min/{1.73_m2} (ref >59)
GLUCOSE: 90 mg/dL (ref 65–99)
Potassium: 4.5 mmol/L (ref 3.5–5.2)
Sodium: 142 mmol/L (ref 134–144)

## 2013-12-01 ENCOUNTER — Encounter (HOSPITAL_COMMUNITY)
Admission: RE | Admit: 2013-12-01 | Discharge: 2013-12-01 | Disposition: A | Discharge status: Home / Self Care | Payer: Medicare Other | Source: Ambulatory Visit | Attending: Cardiology | Admitting: Cardiology

## 2013-12-01 ENCOUNTER — Other Ambulatory Visit (HOSPITAL_COMMUNITY): Payer: Medicare Other

## 2013-12-01 ENCOUNTER — Encounter (HOSPITAL_COMMUNITY): Payer: Self-pay

## 2013-12-01 DIAGNOSIS — I509 Heart failure, unspecified: Secondary | ICD-10-CM

## 2013-12-01 DIAGNOSIS — R0600 Dyspnea, unspecified: Secondary | ICD-10-CM

## 2013-12-01 DIAGNOSIS — I428 Other cardiomyopathies: Secondary | ICD-10-CM

## 2013-12-01 DIAGNOSIS — I519 Heart disease, unspecified: Secondary | ICD-10-CM

## 2013-12-01 DIAGNOSIS — E785 Hyperlipidemia, unspecified: Secondary | ICD-10-CM | POA: Insufficient documentation

## 2013-12-01 DIAGNOSIS — I4949 Other premature depolarization: Secondary | ICD-10-CM | POA: Insufficient documentation

## 2013-12-01 MED ORDER — TECHNETIUM TC 99M SESTAMIBI - CARDIOLITE
30.0000 | Freq: Once | INTRAVENOUS | Status: AC | PRN
  Administered 2013-12-01: 12:00:00 30 via INTRAVENOUS

## 2013-12-01 MED ORDER — REGADENOSON 0.4 MG/5ML IV SOLN
INTRAVENOUS | Status: AC
  Administered 2013-12-01: 0.4 mg via INTRAVENOUS
  Filled 2013-12-01: qty 5

## 2013-12-01 MED ORDER — SODIUM CHLORIDE 0.9 % IJ SOLN
INTRAMUSCULAR | Status: AC
  Administered 2013-12-01: 10 mL via INTRAVENOUS
  Filled 2013-12-01: qty 10

## 2013-12-01 MED ORDER — TECHNETIUM TC 99M SESTAMIBI GENERIC - CARDIOLITE
10.0000 | Freq: Once | INTRAVENOUS | Status: AC | PRN
  Administered 2013-12-01: 10 via INTRAVENOUS

## 2013-12-01 NOTE — Progress Notes (Signed)
Stress Lab Nurses Notes - Taylor Cross 12/01/2013 Reason for doing test: DOE & Heart failure Type of test: [name] Nurse performing test: Parke Poisson, RN Nuclear Medicine Tech: Marcella Dubs Echo Tech: Not Applicable MD performing test: S. McDowell/K.Lawrence NP Family MD: Christell Constant Test explained and consent signed: yes IV started: 22g jelco, Saline lock flushed, No redness or edema and Saline lock started in radiology Symptoms: dizziness & Nausea Treatment/Intervention: None Reason test stopped: protocol completed After recovery IV was: Discontinued via X-ray tech and No redness or edema Patient to return to Nuc. Med at : 12:00 Patient discharged: Home Patient's Condition upon discharge was: stable Comments: During test BP 112/86 & HR 85.  Recovery BP 120/86 & HR 73.  Symptoms resolved in recovery. Tawni Millers

## 2013-12-02 ENCOUNTER — Encounter: Payer: Self-pay | Admitting: Pulmonary Disease

## 2013-12-02 ENCOUNTER — Ambulatory Visit (INDEPENDENT_AMBULATORY_CARE_PROVIDER_SITE_OTHER): Payer: Medicare Other | Admitting: Pulmonary Disease

## 2013-12-02 ENCOUNTER — Encounter: Payer: Self-pay | Admitting: *Deleted

## 2013-12-02 VITALS — BP 160/80 | HR 80 | Ht 68.0 in | Wt 187.6 lb

## 2013-12-02 DIAGNOSIS — J438 Other emphysema: Secondary | ICD-10-CM

## 2013-12-02 DIAGNOSIS — J90 Pleural effusion, not elsewhere classified: Secondary | ICD-10-CM

## 2013-12-02 DIAGNOSIS — R0989 Other specified symptoms and signs involving the circulatory and respiratory systems: Secondary | ICD-10-CM

## 2013-12-02 DIAGNOSIS — J449 Chronic obstructive pulmonary disease, unspecified: Secondary | ICD-10-CM | POA: Insufficient documentation

## 2013-12-02 DIAGNOSIS — R0600 Dyspnea, unspecified: Secondary | ICD-10-CM

## 2013-12-02 DIAGNOSIS — J439 Emphysema, unspecified: Secondary | ICD-10-CM | POA: Insufficient documentation

## 2013-12-02 DIAGNOSIS — R0609 Other forms of dyspnea: Secondary | ICD-10-CM

## 2013-12-02 MED ORDER — ALBUTEROL SULFATE HFA 108 (90 BASE) MCG/ACT IN AERS: 1.0000 | INHALATION_SPRAY | Freq: Four times a day (QID) | RESPIRATORY_TRACT | Status: DC | PRN

## 2013-12-02 NOTE — Progress Notes (Deleted)
   Subjective:    Patient ID: Taylor Cross, male    DOB: 04-Jul-1946, 68 y.o.   MRN: [id number]  HPI    Review of Systems  Constitutional: Negative for fever and unexpected weight change.  HENT: Negative for congestion, dental problem, ear pain, nosebleeds, postnasal drip, rhinorrhea, sinus pressure, sneezing, sore throat and trouble swallowing.   Eyes: Negative for redness and itching.  Respiratory: Negative for cough, chest tightness, shortness of breath and wheezing.   Cardiovascular: Negative for palpitations and leg swelling.  Gastrointestinal: Negative for nausea and vomiting.  Genitourinary: Negative for dysuria.  Musculoskeletal: Negative for joint swelling.  Skin: Negative for rash.  Neurological: Negative for headaches.  Hematological: Does not bruise/bleed easily.  Psychiatric/Behavioral: Negative for dysphoric mood. The patient is not nervous/anxious.        Objective:   Physical Exam        Assessment & Plan:

## 2013-12-02 NOTE — Assessment & Plan Note (Signed)
He has very small effusions on recent imaging studies >> to small for thoracentesis.  Likely related to heart failure. Will plan to repeat CXR at next visit.

## 2013-12-02 NOTE — Patient Instructions (Signed)
Will schedule breathing test (PFT) >> okay to do at [facility] two puffs up to four times per day as needed for cough, wheeze, or chest congestion Follow up in 6 weeks with chest xray

## 2013-12-02 NOTE — Assessment & Plan Note (Signed)
?  if this is related to second hand tobacco exposure.  Very mild changes on CT.  Will further assess with PFT.

## 2013-12-02 NOTE — Assessment & Plan Note (Signed)
Likely multifactorial.  He has combined heart failure, and this is being managed by PCP and cardiology.  He has symptoms suggestive of asthma.  He has second hand tobacco exposure, and mild changes of emphysema on CT chest >> will give trial of proair and arrange for PFT's.  He likely has a component of deconditioning.

## 2013-12-02 NOTE — Progress Notes (Signed)
Chief Complaint  Patient presents with  . pulmonary consult    Consult per Dr Rudi Heaponald Moore - Ongoing SOB for past 6 mths - Worse on exertion and with inclines - Cough at HS - Occas prod (clear) - Occas nosebleeeds that drains to throat - Had recent Chest CT 4/015 (In EMR)    History of Present Illness: Taylor Cross is a 68 y.o. male for evaluation of dyspnea.  He has noticed trouble with his breathing for years.  He had CXR recently which showed small left effusion.  He then had CT chest. This showed small left effusion and mild changes of emphysema.  As a result he was referred to pulmonary for further evaluation.  He also had cardiac evaluation recently, and found to have systolic/diastolic heart failure.  He never smoked, but his parents were smokers.  He worked with the telephone company.  He denies history of pneumonia.  He gets allergies around hay, feathers, and pollen.  He denies animal exposures.  His breathing is okay at rest.  He gets winded if he goes up stairs or up a hill.  He gets occasional cough with clear sputum.  He sometimes gets wheezing also.  He denies hemoptysis, chest pain, or palpitations.  Tests: CT chest 10/28/13 >> CAD, small Lt > Rt effusions, mild emphysema Echo 11/04/13 >> EF 40%, grade 2 diastolic dysfx, mild/mod MR, mod LA dilation Lexiscan cardiolite 11/11/13 >> EF 31%, global hypokinesis  Franchot GalloFred D Krejci  has a past medical history of Hyperlipidemia and Combined systolic and diastolic heart failure.  Franchot GalloFred D Range  has past surgical history that includes Tonsillectomy.  Prior to Admission medications   Medication Sig Start Date End Date Taking? Authorizing Provider  aspirin 81 MG chewable tablet Chew 81 mg by mouth daily.    Historical Provider, MD  carvedilol (COREG) 3.125 MG tablet Take 1 tablet (3.125 mg total) by mouth 2 (two) times daily. 11/11/13   Antoine PocheJonathan F Branch, MD  clindamycin (CLINDAGEL) 1 % gel Apply 1 application topically 2 (two) times  daily as needed.    Historical Provider, MD  Clobetasol Prop Emollient Base (CLOBETASOL PROPIONATE E) 0.05 % emollient cream Apply 1 Tube topically 2 (two) times daily. 09/21/13   Deatra CanterWilliam J Oxford, FNP  colesevelam [facility](WELCHOL) 625 MG tablet Take 2 tablets (1,250 mg total) by mouth 2 (two) times daily with a meal. 04/07/13   Deatra CanterWilliam J Oxford, FNP  doxazosin (CARDURA) 4 MG tablet Take 1 tablet (4 mg total) by mouth daily. 04/07/13   Deatra CanterWilliam J Oxford, FNP  finasteride (PROSCAR) 5 MG tablet Take 1 tablet by mouth daily. 09/08/13   Historical Provider, MD  fluticasone (CUTIVATE) 0.05 % cream Apply topically 2 (two) times daily. 04/07/13   Deatra CanterWilliam J Oxford, FNP  gabapentin (NEURONTIN) 300 MG capsule Take 1 capsule (300 mg total) by mouth 2 (two) times daily. 04/07/13   Deatra CanterWilliam J Oxford, FNP  hydroquinone 4 % cream Apply 1 application topically 2 (two) times daily.    Historical Provider, MD  lisinopril (PRINIVIL,ZESTRIL) 5 MG tablet Take 1 tablet (5 mg total) by mouth daily. 11/11/13   Antoine PocheJonathan F Branch, MD  meloxicam (MOBIC) 15 MG tablet Take 1 tablet (15 mg total) by mouth daily. 04/07/13   Deatra CanterWilliam J Oxford, FNP  pravastatin (PRAVACHOL) 40 MG tablet Take 1 tablet (40 mg total) by mouth daily. 04/07/13   Deatra CanterWilliam J Oxford, FNP  retapamulin (ALTABAX) 1 % ointment Apply 1 application topically 2 (two) times  daily as needed.    Historical Provider, MD  sertraline (ZOLOFT) 50 MG tablet Take 1 tablet (50 mg total) by mouth daily. 04/07/13   Deatra Canter, FNP  tamsulosin (FLOMAX) 0.4 MG CAPS capsule Take 2 capsules (0.8 mg total) by mouth daily. 04/17/13   Deatra Canter, FNP  traMADol (ULTRAM) 50 MG tablet Take 100 mg by mouth daily. 10/06/13   Deatra Canter, FNP    Allergies  Allergen Reactions  . Neosporin [Neomycin-Bacitracin Zn-Polymyx] Rash    His family history includes COPD in his father; Diabetes in his mother; Heart disease in his mother.  He  reports that he has never smoked. He has never used smokeless  tobacco. He reports that he does not drink alcohol or use illicit drugs.  Review of Systems  Negative except above  Physical Exam:  General - No distress ENT - No sinus tenderness, no oral exudate, no LAN, no thyromegaly, TM clear, pupils equal/reactive Cardiac - s1s2 regular, no murmur, pulses symmetric Chest - No wheeze/rales/dullness, good air entry, normal respiratory excursion Back - No focal tenderness Abd - Soft, non-tender, no organomegaly, + bowel sounds Ext - No edema Neuro - Normal strength, cranial nerves intact Skin - No rashes Psych - Normal mood, and behavior   Lab Results  Component Value Date   WBC 7.3 10/06/2013   HGB 13.0* 10/06/2013   HCT 42.1* 10/06/2013   MCV 89.2 10/06/2013    Lab Results  Component Value Date   CREATININE 1.00 11/25/2013   BUN 24 11/25/2013   NA 142 11/25/2013   K 4.5 11/25/2013   CL 102 11/25/2013   CO2 22 11/25/2013    Lab Results  Component Value Date   ALT 13 10/06/2013   AST 16 10/06/2013   ALKPHOS 69 10/06/2013   BILITOT 0.4 10/06/2013    Lab Results  Component Value Date   TSH 1.910 10/06/2013    Assessment/Plan:  Coralyn Helling, MD [place] [facility]/Critical Care/Sleep Pager:  [phone]

## 2013-12-04 ENCOUNTER — Telehealth: Payer: Self-pay | Admitting: *Deleted

## 2013-12-04 NOTE — Telephone Encounter (Signed)
Notes Recorded by Lesle Chris, LPN on 10/30/5684 at 11:52 AM Patient notified and verbalized understanding. Already has follow scheduled for 12/16/2013 with Dr. Wyline Mood.

## 2013-12-04 NOTE — Telephone Encounter (Signed)
Message copied by Lesle Chris on Fri Dec 04, 2013 11:52 AM ------      Message from: [place] F      Created: Thu Dec 03, 2013  8:42 AM       Please let patient know stress test shows evidence of old blockages that may have caused the pumping function of his heart to decrease. There does not appear to be evidence of any new blockages. We will continue current medications and plan for follow up            Dominga Ferry MD ------

## 2013-12-16 ENCOUNTER — Ambulatory Visit: Payer: Medicare Other | Admitting: Cardiology

## 2013-12-28 ENCOUNTER — Telehealth: Payer: Self-pay | Admitting: Pulmonary Disease

## 2013-12-28 NOTE — Telephone Encounter (Addendum)
Spoke with pt-- Order was placed for PFT at [facility] on 12/02/13-- pt has not heard anything about an appt date/time. Aware that someone from our office will be contacting him to set up appt for PFT at [facility].  Spoke with [facility]--per Bjorn Loser, this is being worked on. Please advise Rhonda. Thanks.

## 2013-12-29 NOTE — Telephone Encounter (Signed)
Order was placed as clinic performed, and patient was not brought around to [facility]. [facility] didn't know by the order that this was to be done at [facility]. Called and scheduled PFT appointment at [facility] for 01/20/14 at 11:00. [facility] only does PFT's twice a week. Advised patient of this and tried to get patient in here at [facility] on Thurs 12/31/13 at 1:00, however, patient declined, stating that he would rather go to [facility] since it was closer and he knew his way around [facility]. Pt has appointment scheduled with Dr. Craige Cotta on 01/13/14 and if we could get the PFT here, pt could still keep his original appointment. Also gave patient the option of still seeing Dr. Craige Cotta on 01/13/14 and go to [facility] same day as appointment here, and pt once again refused this option. Pt stated that he preferred going to [facility] and wished to keep PFT there. Advised patient that the schedulers would call him to move his appointment with Dr. Craige Cotta since we needed the PFT first. Also advised the patient that if he hasn't heard anything from [place] about a test/and or procedure in a week to call us to inquire, please do not wait 3-4 wks. Pt's appointment r/s to 02/04/14 at 2:00 with Dr. Craige Cotta. Appointment was scheduled with patient on the phone with one of our schedulers. Nothing else needed at this time. Rhonda J Cobb

## 2013-12-30 ENCOUNTER — Encounter (HOSPITAL_COMMUNITY): Payer: Medicare Other

## 2014-01-05 ENCOUNTER — Encounter: Payer: Self-pay | Admitting: Cardiology

## 2014-01-05 ENCOUNTER — Ambulatory Visit (INDEPENDENT_AMBULATORY_CARE_PROVIDER_SITE_OTHER): Payer: Medicare Other | Admitting: Cardiology

## 2014-01-05 VITALS — BP 146/73 | HR 80 | Ht 68.0 in | Wt 185.0 lb

## 2014-01-05 DIAGNOSIS — I5022 Chronic systolic (congestive) heart failure: Secondary | ICD-10-CM

## 2014-01-05 MED ORDER — CARVEDILOL 6.25 MG PO TABS: 6.2500 mg | ORAL_TABLET | Freq: Two times a day (BID) | ORAL | Status: DC

## 2014-01-05 NOTE — Patient Instructions (Signed)
   Increase Coreg to 6.25mg  twice a day - new sent to pharm Continue all other medications.   Follow up in  6 weeks

## 2014-01-05 NOTE — Progress Notes (Signed)
Clinical Summary Taylor Cross is a 68 y.o.male seen today for follow up of the following medical problems. This is a focused visit on his history of systolic/diastolic heart failure.   1. Systolic/Diastolic dysfunction  - echo which showed LVEF 40-45%, mid-inferolateral wall hypokinesis, and grade II diastolic dysfunction.  - since last visit completed lexiscan which showed moderate sized moderate to severe intensity inferolateral wall defect and small apical to mid inferior wall defect. Both areas were fixed and consistent with scar.  - last visit we also started coreg and lisinopril, Cr and K remain stable on repeat labs - reports his breathing is improving since I last saw him.      Past Medical History  Diagnosis Date  . Hyperlipidemia   . Combined systolic and diastolic heart failure      Allergies  Allergen Reactions  . Neosporin [Neomycin-Bacitracin Zn-Polymyx] Rash     Current Outpatient Prescriptions  Medication Sig Dispense Refill  . albuterol (PROAIR HFA) 108 (90 BASE) MCG/ACT inhaler Inhale 1-2 puffs into the lungs every 6 (six) hours as needed for wheezing or shortness of breath.  1 Inhaler  2  . aspirin 81 MG chewable tablet Chew 81 mg by mouth daily.      . carvedilol (COREG) 3.125 MG tablet Take 1 tablet (3.125 mg total) by mouth 2 (two) times daily.  60 tablet  6  . clindamycin (CLINDAGEL) 1 % gel Apply 1 application topically 2 (two) times daily as needed.      . Clobetasol Prop Emollient Base (CLOBETASOL PROPIONATE E) 0.05 % emollient cream Apply 1 Tube topically 2 (two) times daily.  60 g  3  . colesevelam (WELCHOL) 625 MG tablet Take 2 tablets (1,250 mg total) by mouth 2 (two) times daily with a meal.  120 tablet  11  . doxazosin (CARDURA) 4 MG tablet Take 1 tablet (4 mg total) by mouth daily.  30 tablet  11  . finasteride (PROSCAR) 5 MG tablet Take 1 tablet by mouth daily.      . fluticasone (CUTIVATE) 0.05 % cream Apply topically 2 (two) times daily.  15  g  5  . gabapentin (NEURONTIN) 300 MG capsule Take 1 capsule (300 mg total) by mouth 2 (two) times daily.  60 capsule  11  . hydroquinone 4 % cream Apply 1 application topically 2 (two) times daily.      Marland Kitchen lisinopril (PRINIVIL,ZESTRIL) 5 MG tablet Take 1 tablet (5 mg total) by mouth daily.  30 tablet  6  . meloxicam (MOBIC) 15 MG tablet Take 1 tablet (15 mg total) by mouth daily.  30 tablet  11  . pravastatin (PRAVACHOL) 40 MG tablet Take 1 tablet (40 mg total) by mouth daily.  30 tablet  11  . retapamulin (ALTABAX) 1 % ointment Apply 1 application topically 2 (two) times daily as needed.      . sertraline (ZOLOFT) 50 MG tablet Take 1 tablet (50 mg total) by mouth daily.  30 tablet  11  . tamsulosin (FLOMAX) 0.4 MG CAPS capsule Take 2 capsules (0.8 mg total) by mouth daily.  60 capsule  11  . traMADol (ULTRAM) 50 MG tablet Take 100 mg by mouth daily.       No current facility-administered medications for this visit.     Past Surgical History  Procedure Laterality Date  . Tonsillectomy       Allergies  Allergen Reactions  . Neosporin [Neomycin-Bacitracin Zn-Polymyx] Rash  Family History  Problem Relation Age of Onset  . Heart disease Mother   . Diabetes Mother   . COPD Father      Social History Taylor Cross reports that he has never smoked. He has never used smokeless tobacco. Taylor Cross reports that he does not drink alcohol.   Review of Systems CONSTITUTIONAL: No weight loss, fever, chills, weakness or fatigue.  HEENT: Eyes: No visual loss, blurred vision, double vision or yellow sclerae.No hearing loss, sneezing, congestion, runny nose or sore throat.  SKIN: No rash or itching.  CARDIOVASCULAR: per HPI RESPIRATORY: No shortness of breath, cough or sputum.  GASTROINTESTINAL: No anorexia, nausea, vomiting or diarrhea. No abdominal pain or blood.  GENITOURINARY: No burning on urination, no polyuria NEUROLOGICAL: No headache, dizziness, syncope, paralysis, ataxia,  numbness or tingling in the extremities. No change in bowel or bladder control.  MUSCULOSKELETAL: No muscle, back pain, joint pain or stiffness.  LYMPHATICS: No enlarged nodes. No history of splenectomy.  PSYCHIATRIC: No history of depression or anxiety.  ENDOCRINOLOGIC: No reports of sweating, cold or heat intolerance. No polyuria or polydipsia.  Marland Kitchen.   Physical Examination p 80 bp 146/73 Wt 185 lbs BMI 28 Gen: resting comfortably, no acute distress HEENT: no scleral icterus, pupils equal round and reactive, no palptable cervical adenopathy,  CV: RRR, no m/r/g, no JVD Resp: Clear to auscultation bilaterally GI: abdomen is soft, non-tender, non-distended, normal bowel sounds, no hepatosplenomegaly MSK: extremities are warm, no edema.  Skin: warm, no rash Neuro:  no focal deficits Psych: appropriate affect   Diagnostic Studies 10/2013 Echo  Left ventricle: The cavity size was mildly dilated. Wall thickness was normal. Systolic function was mildly to moderately reduced. The estimated ejection fraction is 40%. Features are consistent with a pseudonormal left ventricular filling pattern, with concomitant abnormal relaxation and increased filling pressure (grade 2 diastolic dysfunction). Doppler parameters are consistent with high ventricular filling pressure. - Regional wall motion abnormality: Mild to moderate hypokinesis of the mid inferolateral myocardium. The remaining walls are mildly hypokinetic. - Aortic valve: Trileaflet; mildly thickened, mildly calcified leaflets. There was no stenosis. - Mitral valve: Mildly thickened leaflets . Mild to moderate regurgitation. - Left atrium: The atrium was moderately dilated. - Tricuspid valve: Mild regurgitation. Inadequate TR jet to accurately assess pulmonary pressures. - Pericardium, extracardiac: There was a left pleural effusion.   11/11/13 Lexiscan MPI Gated imaging reveals an EDV of 223, ESV of 153, TID ratio 0.99, and LVEF of  31% with global hypokinesis, most prominent in the apex and inferolateral walls.  IMPRESSION: Intermediate risk Lexiscan Cardiolite. No diagnostic ST segment changes were noted to indicate ischemia, frequent PVCs were seen without sustained arrhythmia. Perfusion imaging is most consistent with scar affecting the inferior wall and inferolateral wall as outlined, no large ischemic zones however. LVEF is calculated at 31% with global hypokinesis that is most prominent at the apex and inferior wall. EDV indicates moderate to severe chamber dilatation. Findings are consistent with probable ischemic cardiomyopathy.      Assessment and Plan  1. Systolic/diastolic dysfunction  - fairly new diagnosis for patient, noted on recent echo 10/2013. LVEF 40-45%, Lexiscan suggests this is an ischemic cardiomyopathy with evidence of prior scar, no active ischemia.   - will increase coreg to 6.25mg  bid, counseled on side effects to monitor for    F/u 6 weeks for further medication titration.       Antoine PocheJonathan F. Pocahontas Cohenour, M.D., F.A.C.C.

## 2014-01-13 ENCOUNTER — Ambulatory Visit: Payer: Medicare Other | Admitting: Pulmonary Disease

## 2014-01-20 ENCOUNTER — Ambulatory Visit (HOSPITAL_COMMUNITY)
Admission: RE | Admit: 2014-01-20 | Discharge: 2014-01-20 | Disposition: A | Discharge status: Home / Self Care | Payer: Medicare Other | Source: Ambulatory Visit | Attending: Pulmonary Disease | Admitting: Pulmonary Disease

## 2014-01-20 DIAGNOSIS — J438 Other emphysema: Secondary | ICD-10-CM | POA: Insufficient documentation

## 2014-01-20 DIAGNOSIS — J439 Emphysema, unspecified: Secondary | ICD-10-CM

## 2014-01-20 LAB — PULMONARY FUNCTION TEST
DL/VA % PRED: 82 %
DL/VA: 3.71 ml/min/mmHg/L
DLCO UNC % PRED: 50 %
DLCO cor % pred: 50 %
DLCO cor: 15.44 ml/min/mmHg
DLCO unc: 15.44 ml/min/mmHg
FEF 25-75 Post: 2.74 L/sec
FEF 25-75 Pre: 3.44 L/sec
FEF2575-%Change-Post: -20 %
FEF2575-%PRED-POST: 112 %
FEF2575-%Pred-Pre: 141 %
FEV1-%Change-Post: -2 %
FEV1-%Pred-Post: 78 %
FEV1-%Pred-Pre: 80 %
FEV1-POST: 2.46 L
FEV1-Pre: 2.52 L
FEV1FVC-%CHANGE-POST: -1 %
FEV1FVC-%Pred-Pre: 115 %
FEV6-%Change-Post: 1 %
FEV6-%PRED-PRE: 71 %
FEV6-%Pred-Post: 72 %
FEV6-Post: 2.9 L
FEV6-Pre: 2.85 L
FEV6FVC-%Pred-Post: 106 %
FEV6FVC-%Pred-Pre: 106 %
FVC-%CHANGE-POST: -1 %
FVC-%Pred-Post: 68 %
FVC-%Pred-Pre: 69 %
FVC-PRE: 2.94 L
FVC-Post: 2.9 L
PRE FEV1/FVC RATIO: 86 %
PRE FEV6/FVC RATIO: 100 %
Post FEV1/FVC ratio: 85 %
Post FEV6/FVC ratio: 100 %
RV % PRED: 100 %
RV: 2.33 L
TLC % PRED: 75 %
TLC: 5.05 L

## 2014-01-20 MED ORDER — ALBUTEROL SULFATE (2.5 MG/3ML) 0.083% IN NEBU
2.5000 mg | INHALATION_SOLUTION | Freq: Once | RESPIRATORY_TRACT | Status: AC
  Administered 2014-01-20: 2.5 mg via RESPIRATORY_TRACT

## 2014-02-04 ENCOUNTER — Encounter: Payer: Self-pay | Admitting: Pulmonary Disease

## 2014-02-04 ENCOUNTER — Ambulatory Visit (INDEPENDENT_AMBULATORY_CARE_PROVIDER_SITE_OTHER): Payer: Medicare Other | Admitting: Pulmonary Disease

## 2014-02-04 VITALS — BP 130/88 | HR 62 | Ht 68.0 in | Wt 190.4 lb

## 2014-02-04 DIAGNOSIS — R0683 Snoring: Secondary | ICD-10-CM | POA: Insufficient documentation

## 2014-02-04 DIAGNOSIS — R0609 Other forms of dyspnea: Secondary | ICD-10-CM

## 2014-02-04 DIAGNOSIS — J438 Other emphysema: Secondary | ICD-10-CM

## 2014-02-04 DIAGNOSIS — R0600 Dyspnea, unspecified: Secondary | ICD-10-CM

## 2014-02-04 DIAGNOSIS — R0989 Other specified symptoms and signs involving the circulatory and respiratory systems: Secondary | ICD-10-CM

## 2014-02-04 MED ORDER — TIOTROPIUM BROMIDE MONOHYDRATE 18 MCG IN CAPS: 18.0000 ug | ORAL_CAPSULE | Freq: Every day | RESPIRATORY_TRACT | Status: DC

## 2014-02-04 NOTE — Patient Instructions (Signed)
Spiriva one puff daily Proair two puffs up to four times per day as needed for cough, wheeze, or chest congestion Follow up in 2 months

## 2014-02-04 NOTE — Progress Notes (Signed)
Chief Complaint  Patient presents with  . Follow-up    Pt c/o productive cough at night with brown mucus. Denies SOB, chest tightness. Requesting PFT results.     History of Present Illness: Taylor Cross is a 68 y.o. male with dyspnea.  He has hx of diastolic/systolic heart failure, CAD, small pleural effusions, and mild emphysema.  He is here to review his PFT.  This showed mild restriction, moderate diffusion defect.  He still has cough, mostly at night.  His breathing has improved some after change to his heart medicines.  TESTS: CT chest 10/28/13 >> CAD, small Lt > Rt effusions, mild emphysema  Echo 11/04/13 >> EF 40%, grade 2 diastolic dysfx, mild/mod MR, mod LA dilation  Lexiscan cardiolite 11/11/13 >> EF 31%, global hypokinesis PFT 01/20/14 >> FEV1 2.52 (80%), FEV1% 86, TLC 5.05 (75%), DLCO 50%, no BD  Devarious D Dicamillo  has a past medical history of Hyperlipidemia and Combined systolic and diastolic heart failure.  Franchot Gallo Beldin  has past surgical history that includes Tonsillectomy.  Prior to Admission medications   Medication Sig Start Date End Date Taking? Authorizing Provider  albuterol (PROAIR HFA) 108 (90 BASE) MCG/ACT inhaler Inhale 1-2 puffs into the lungs every 6 (six) hours as needed for wheezing or shortness of breath. 12/02/13  Yes Coralyn Helling, MD  aspirin 81 MG chewable tablet Chew 81 mg by mouth daily.   Yes Historical Provider, MD  carvedilol (COREG) 6.25 MG tablet Take 1 tablet (6.25 mg total) by mouth 2 (two) times daily. 01/05/14  Yes Antoine Poche, MD  clindamycin (CLINDAGEL) 1 % gel Apply 1 application topically 2 (two) times daily as needed.   Yes Historical Provider, MD  Clobetasol Prop Emollient Base (CLOBETASOL PROPIONATE E) 0.05 % emollient cream Apply 1 Tube topically 2 (two) times daily. 09/21/13  Yes Deatra Canter, FNP  colesevelam (WELCHOL) 625 MG tablet Take 1,250 mg by mouth 3 (three) times daily with meals. 04/07/13  Yes Deatra Canter, FNP   doxazosin (CARDURA) 4 MG tablet Take 1 tablet (4 mg total) by mouth daily. 04/07/13  Yes Deatra Canter, FNP  finasteride (PROSCAR) 5 MG tablet Take 1 tablet by mouth daily. 09/08/13  Yes Historical Provider, MD  fluticasone (CUTIVATE) 0.05 % cream Apply topically 2 (two) times daily. 04/07/13  Yes Deatra Canter, FNP  gabapentin (NEURONTIN) 300 MG capsule Take 1 capsule (300 mg total) by mouth 2 (two) times daily. 04/07/13  Yes Deatra Canter, FNP  hydroquinone 4 % cream Apply 1 application topically 2 (two) times daily.   Yes Historical Provider, MD  lisinopril (PRINIVIL,ZESTRIL) 5 MG tablet Take 1 tablet (5 mg total) by mouth daily. 11/11/13  Yes Antoine Poche, MD  meloxicam (MOBIC) 15 MG tablet Take 1 tablet (15 mg total) by mouth daily. 04/07/13  Yes Deatra Canter, FNP  pravastatin (PRAVACHOL) 40 MG tablet Take 1 tablet (40 mg total) by mouth daily. 04/07/13  Yes Deatra Canter, FNP  retapamulin (ALTABAX) 1 % ointment Apply 1 application topically 2 (two) times daily as needed.   Yes Historical Provider, MD  sertraline (ZOLOFT) 50 MG tablet Take 1 tablet (50 mg total) by mouth daily. 04/07/13  Yes Deatra Canter, FNP  tamsulosin (FLOMAX) 0.4 MG CAPS capsule Take 2 capsules (0.8 mg total) by mouth daily. 04/17/13  Yes Deatra Canter, FNP  traMADol (ULTRAM) 50 MG tablet Take 100 mg by mouth daily. 10/06/13  Yes Anselm Pancoast  Oxford, FNP    Allergies  Allergen Reactions  . Neosporin [Neomycin-Bacitracin Zn-Polymyx] Rash     Physical Exam:  General - No distress ENT - No sinus tenderness, no oral exudate, no LAN Cardiac - s1s2 regular, no murmur Chest - No wheeze/rales/dullness Back - No focal tenderness Abd - Soft, non-tender Ext - No edema Neuro - Normal strength Skin - No rashes Psych - normal mood, and behavior   Assessment/Plan:  Coralyn HellingVineet Gudrun Axe, MD [place] [facility]/Critical Care/Sleep Pager:  [phone]

## 2014-02-04 NOTE — Assessment & Plan Note (Signed)
Will re-assess whether he needs a sleep study to assess for sleep apnea at his next visit.

## 2014-02-04 NOTE — Assessment & Plan Note (Signed)
His recent CT chest shows mild changes of emphysema.  His PFT showed diffusion defect.  He reports benefit from albuterol use.  Will try him on spiriva >> sample given, and demonstrated use.  Advised him to call and stop spiriva if he has trouble passing urine.

## 2014-02-04 NOTE — Assessment & Plan Note (Signed)
Likely related to heart failure and emphysema.

## 2014-02-16 ENCOUNTER — Encounter: Payer: Self-pay | Admitting: Cardiology

## 2014-02-16 ENCOUNTER — Ambulatory Visit (INDEPENDENT_AMBULATORY_CARE_PROVIDER_SITE_OTHER): Payer: Medicare Other | Admitting: Cardiology

## 2014-02-16 VITALS — BP 131/75 | HR 73 | Ht 68.0 in | Wt 186.0 lb

## 2014-02-16 DIAGNOSIS — I5022 Chronic systolic (congestive) heart failure: Secondary | ICD-10-CM

## 2014-02-16 MED ORDER — CARVEDILOL 12.5 MG PO TABS: 12.5000 mg | ORAL_TABLET | Freq: Two times a day (BID) | ORAL | Status: DC

## 2014-02-16 NOTE — Patient Instructions (Signed)
   Increase Coreg to 12.5mg  twice a day  - new sent to pharm (may take 2 tabs of your 6.25mg  twice a day till finish current supply) Continue all other medications.    Your physician has requested that you regularly monitor and record your blood pressure readings at home.  Please take approximately 2 hours after medication.  Bring to next office visit for MD review. Follow up in  2 months

## 2014-02-16 NOTE — Progress Notes (Signed)
Patient ID: Taylor Cross, male   DOB: 1946-04-18, 68 y.o.   MRN: [id number]      Clinical Summary Mr. Abdalla is a 68 y.o.male seen today for follow up of the following medical problems. This is a focused visit on his history of systolic/diastolic heart failure.   1. Chronic Systolic/Diastolic heart failure - echo which showed LVEF 40-45%, mid-inferolateral wall hypokinesis, and grade II diastolic dysfunction.  -  completed lexiscan which showed moderate sized moderate to severe intensity inferolateral wall defect and small apical to mid inferior wall defect. Both areas were fixed and consistent with scar.  - denies any SOB, DOE, orthopnea, PND, or LE edema  - last visit increased coreg to 6.25mg  bid. Describes some ocassional dizziness with standing, lasts just a few seconds.   2. COPD - followed by Dr Mauricia Area Past Medical History  Diagnosis Date  . Hyperlipidemia   . Combined systolic and diastolic heart failure      Allergies  Allergen Reactions  . Neosporin [Neomycin-Bacitracin Zn-Polymyx] Rash     Current Outpatient Prescriptions  Medication Sig Dispense Refill  . albuterol (PROAIR HFA) 108 (90 BASE) MCG/ACT inhaler Inhale 1-2 puffs into the lungs every 6 (six) hours as needed for wheezing or shortness of breath.  1 Inhaler  2  . aspirin 81 MG chewable tablet Chew 81 mg by mouth daily.      . carvedilol (COREG) 6.25 MG tablet Take 1 tablet (6.25 mg total) by mouth 2 (two) times daily.  60 tablet  6  . clindamycin (CLINDAGEL) 1 % gel Apply 1 application topically 2 (two) times daily as needed.      . Clobetasol Prop Emollient Base (CLOBETASOL PROPIONATE E) 0.05 % emollient cream Apply 1 Tube topically 2 (two) times daily.  60 g  3  . colesevelam (WELCHOL) 625 MG tablet Take 1,250 mg by mouth 3 (three) times daily with meals.      . doxazosin (CARDURA) 4 MG tablet Take 1 tablet (4 mg total) by mouth daily.  30 tablet  11  . finasteride (PROSCAR) 5 MG tablet Take 1  tablet by mouth daily.      . fluticasone (CUTIVATE) 0.05 % cream Apply topically 2 (two) times daily.  15 g  5  . gabapentin (NEURONTIN) 300 MG capsule Take 1 capsule (300 mg total) by mouth 2 (two) times daily.  60 capsule  11  . hydroquinone 4 % cream Apply 1 application topically 2 (two) times daily.      Marland Kitchen lisinopril (PRINIVIL,ZESTRIL) 5 MG tablet Take 1 tablet (5 mg total) by mouth daily.  30 tablet  6  . meloxicam (MOBIC) 15 MG tablet Take 1 tablet (15 mg total) by mouth daily.  30 tablet  11  . pravastatin (PRAVACHOL) 40 MG tablet Take 1 tablet (40 mg total) by mouth daily.  30 tablet  11  . retapamulin (ALTABAX) 1 % ointment Apply 1 application topically 2 (two) times daily as needed.      . sertraline (ZOLOFT) 50 MG tablet Take 1 tablet (50 mg total) by mouth daily.  30 tablet  11  . tamsulosin (FLOMAX) 0.4 MG CAPS capsule Take 2 capsules (0.8 mg total) by mouth daily.  60 capsule  11  . tiotropium (SPIRIVA HANDIHALER) 18 MCG inhalation capsule Place 1 capsule (18 mcg total) into inhaler and inhale daily.  30 capsule  12  . traMADol (ULTRAM) 50 MG tablet Take 100 mg by mouth daily.  No current facility-administered medications for this visit.     Past Surgical History  Procedure Laterality Date  . Tonsillectomy       Allergies  Allergen Reactions  . Neosporin [Neomycin-Bacitracin Zn-Polymyx] Rash      Family History  Problem Relation Age of Onset  . Heart disease Mother   . Diabetes Mother   . COPD Father      Social History Mr. Tribbett reports that he has never smoked. He has never used smokeless tobacco. [name] reports that he does not drink alcohol.   Review of Systems CONSTITUTIONAL: No weight loss, fever, chills, weakness or fatigue.  HEENT: Eyes: No visual loss, blurred vision, double vision or yellow sclerae.No hearing loss, sneezing, congestion, runny nose or sore throat.  SKIN: No rash or itching.  CARDIOVASCULAR: per HPI RESPIRATORY: No  shortness of breath, cough or sputum.  GASTROINTESTINAL: No anorexia, nausea, vomiting or diarrhea. No abdominal pain or blood.  GENITOURINARY: No burning on urination, no polyuria NEUROLOGICAL: No headache, dizziness, syncope, paralysis, ataxia, numbness or tingling in the extremities. No change in bowel or bladder control.  MUSCULOSKELETAL: No muscle, back pain, joint pain or stiffness.  LYMPHATICS: No enlarged nodes. No history of splenectomy.  PSYCHIATRIC: No history of depression or anxiety.  ENDOCRINOLOGIC: No reports of sweating, cold or heat intolerance. No polyuria or polydipsia.  Marland Kitchen   Physical Examination p 73 bp 131/75 Wt 186 lbs BMI 28 Gen: resting comfortably, no acute distress HEENT: no scleral icterus, pupils equal round and reactive, no palptable cervical adenopathy,  CV: RRR, no m/r/g, no JVD Resp: Clear to auscultation bilaterally GI: abdomen is soft, non-tender, non-distended, normal bowel sounds, no hepatosplenomegaly MSK: extremities are warm, no edema.  Skin: warm, no rash Neuro:  no focal deficits Psych: appropriate affect   Diagnostic Studies 10/2013 Echo  Left ventricle: The cavity size was mildly dilated. Wall thickness was normal. Systolic function was mildly to moderately reduced. The estimated ejection fraction is 40%. Features are consistent with a pseudonormal left ventricular filling pattern, with concomitant abnormal relaxation and increased filling pressure (grade 2 diastolic dysfunction). Doppler parameters are consistent with high ventricular filling pressure. - Regional wall motion abnormality: Mild to moderate hypokinesis of the mid inferolateral myocardium. The remaining walls are mildly hypokinetic. - Aortic valve: Trileaflet; mildly thickened, mildly calcified leaflets. There was no stenosis. - Mitral valve: Mildly thickened leaflets . Mild to moderate regurgitation. - Left atrium: The atrium was moderately dilated. - Tricuspid valve:  Mild regurgitation. Inadequate TR jet to accurately assess pulmonary pressures. - Pericardium, extracardiac: There was a left pleural effusion.   11/11/13 Lexiscan MPI  Gated imaging reveals an EDV of 223, ESV of 153, TID ratio 0.99, and LVEF of 31% with global hypokinesis, most prominent in the apex and inferolateral walls.  IMPRESSION: Intermediate risk Lexiscan Cardiolite. No diagnostic ST segment changes were noted to indicate ischemia, frequent PVCs were seen without sustained arrhythmia. Perfusion imaging is most consistent with scar affecting the inferior wall and inferolateral wall as outlined, no large ischemic zones however. LVEF is calculated at 31% with global hypokinesis that is most prominent at the apex and inferior wall. EDV indicates moderate to severe chamber dilatation. Findings are consistent with probable ischemic cardiomyopathy.      Assessment and Plan  1. Systolic/diastolic dysfunction  - fairly new diagnosis for patient, noted on recent echo 10/2013. LVEF 40-45%, Lexiscan suggests this is an ischemic cardiomyopathy with evidence of prior scar, no active ischemia. He has not  been in favor of invasive testing. Given his mild dysfunction will optimize medical therapy, if persistent or progression in dysfunction recondsider invasive testing at that time. Symptomatically he is improving.   - will increase coreg to 12.5 mg bid, counseled on side effects to monitor for - he has asked to space out our appointments further. Explained the importance of aggressive medication titration in setting of systolic heart failure, he would still prefer the earliest to see us back at 2 months   F/u 2 months  Antoine PocheJonathan F. Caileb Rhue, M.D., F.A.C.C.

## 2014-02-17 ENCOUNTER — Ambulatory Visit (INDEPENDENT_AMBULATORY_CARE_PROVIDER_SITE_OTHER): Payer: Medicare Other | Admitting: Family Medicine

## 2014-02-17 VITALS — BP 130/66 | HR 69 | Temp 97.7°F | Ht 68.0 in | Wt 186.6 lb

## 2014-02-17 DIAGNOSIS — J069 Acute upper respiratory infection, unspecified: Secondary | ICD-10-CM

## 2014-02-17 MED ORDER — METHYLPREDNISOLONE ACETATE 80 MG/ML IJ SUSP
80.0000 mg | Freq: Once | INTRAMUSCULAR | Status: AC
  Administered 2014-02-17: 80 mg via INTRAMUSCULAR

## 2014-02-17 MED ORDER — AZITHROMYCIN 250 MG PO TABS: ORAL_TABLET | ORAL | Status: DC

## 2014-02-17 NOTE — Progress Notes (Signed)
   Subjective:    Patient ID: Taylor Cross, male    DOB: Jul 01, 1946, 68 y.o.   MRN: [id number]  HPI This 68 y.o. male presents for evaluation of URI and headache.   Review of Systems C/o uri and headaches No chest pain, SOB, HA, dizziness, vision change, N/V, diarrhea, constipation, dysuria, urinary urgency or frequency, myalgias, arthralgias or rash.     Objective:   Physical Exam Vital signs noted  Well developed well nourished male.  HEENT - Head atraumatic Normocephalic                Eyes - PERRLA, Conjuctiva - clear Sclera- Clear EOMI                Ears - EAC's Wnl TM's Wnl Gross Hearing WNL                Nose - Nares patent                 Throat - oropharanx wnl Respiratory - Lungs CTA bilateral Cardiac - RRR S1 and S2 without murmur GI - Abdomen soft Nontender and bowel sounds active x 4 Extremities - No edema. Neuro - Grossly intact.      Assessment & Plan:  URI (upper respiratory infection) - Plan: methylPREDNISolone acetate (DEPO-MEDROL) injection 80 mg, azithromycin (ZITHROMAX) 250 MG tablet  Deatra Canter FNP

## 2014-02-26 ENCOUNTER — Telehealth: Payer: Self-pay | Admitting: *Deleted

## 2014-02-26 NOTE — Telephone Encounter (Signed)
Coreg recently increased to 12.5mg  twice a day on 02/16/2014.  Patient c/o dizziness & faint feeling.  BP running 125/70 & 117/59.  HR running in the 60's.  Stated he did have one reading of 94/45.  Stated that he was outside a lot yesterday with dog so that may have made him feel bad.  Stated that he does feel a little better today, not as groggy, and no faint feeling.  Advised to keep log of his readings.  Also, cautioned about being out in extreme heat for extended periods of time & to stay hydrated.  Patient verbalized understanding.

## 2014-03-01 NOTE — Telephone Encounter (Signed)
Common side effects to higher doses of coreg. Often the body adjusts after a few days. If symptoms persist ok to decrease back to prior dose. However, if the long run higher doses are better for the heart if he can tolerate them.   Dominga Ferry MD

## 2014-03-02 NOTE — Telephone Encounter (Signed)
Left message to return call 

## 2014-03-02 NOTE — Telephone Encounter (Signed)
Patient notified & states that he is feeling better.  Will continue same dose of Coreg for now.  Already has follow up scheduled for 04/16/2014 with Dr. Wyline Mood.

## 2014-03-13 ENCOUNTER — Other Ambulatory Visit: Payer: Self-pay | Admitting: Family Medicine

## 2014-03-15 NOTE — Telephone Encounter (Signed)
Please review and advise.

## 2014-03-15 NOTE — Telephone Encounter (Signed)
Last ov 02/17/14 for URI. Last lipids 3/15.

## 2014-03-27 ENCOUNTER — Ambulatory Visit (INDEPENDENT_AMBULATORY_CARE_PROVIDER_SITE_OTHER): Payer: Medicare Other | Admitting: General Practice

## 2014-03-27 ENCOUNTER — Encounter: Payer: Self-pay | Admitting: General Practice

## 2014-03-27 VITALS — BP 120/77 | HR 66 | Temp 97.9°F | Ht 68.0 in | Wt 182.2 lb

## 2014-03-27 DIAGNOSIS — J322 Chronic ethmoidal sinusitis: Secondary | ICD-10-CM

## 2014-03-27 MED ORDER — AZITHROMYCIN 250 MG PO TABS: ORAL_TABLET | ORAL | Status: DC

## 2014-03-27 NOTE — Patient Instructions (Signed)

## 2014-03-27 NOTE — Progress Notes (Signed)
   Subjective:    Patient ID: Taylor Cross, male    DOB: Jul 29, 1946, 68 y.o.   MRN: [id number]  Sinusitis This is a new problem. The current episode started in the past 7 days. The problem has been gradually worsening since onset. There has been no fever. His pain is at a severity of 3/10. Associated symptoms include coughing, headaches and sinus pressure. Pertinent negatives include no congestion or sore throat. Past treatments include oral decongestants. The treatment provided no relief.      Review of Systems  HENT: Positive for sinus pressure. Negative for congestion and sore throat.   Respiratory: Positive for cough. Negative for chest tightness.   Cardiovascular: Negative for palpitations.  Neurological: Positive for headaches. Negative for dizziness and weakness.       Frontal Headache       Objective:   Physical Exam  Constitutional: He is oriented to person, place, and time. He appears well-developed and well-nourished.  HENT:  Head: Normocephalic and atraumatic.  Right Ear: External ear normal.  Left Ear: External ear normal.  Cardiovascular: Normal rate, regular rhythm and normal heart sounds.   Pulmonary/Chest: Effort normal and breath sounds normal. No respiratory distress. He exhibits no tenderness.  Neurological: He is alert and oriented to person, place, and time.  Skin: Skin is warm and dry.  Psychiatric: He has a normal mood and affect.          Assessment & Plan:  1. Ethmoid sinusitis, unspecified chronicity - azithromycin (ZITHROMAX) 250 MG tablet; Take as directed  Dispense: 6 tablet; Refill: 0 -Increase fluid intake -Proper handwashing -discussed and provided patient education  -RTO if symptoms worsen or unresolved -Patient verbalized understanding Coralie Keens, FNP-C

## 2014-04-09 ENCOUNTER — Other Ambulatory Visit: Payer: Self-pay | Admitting: Family Medicine

## 2014-04-13 ENCOUNTER — Other Ambulatory Visit: Payer: Self-pay | Admitting: Family Medicine

## 2014-04-14 NOTE — Telephone Encounter (Signed)
Last seen 02/17/14, last filled 03/14/14. Rx will print

## 2014-04-16 ENCOUNTER — Encounter: Payer: Self-pay | Admitting: Cardiology

## 2014-04-16 ENCOUNTER — Ambulatory Visit (INDEPENDENT_AMBULATORY_CARE_PROVIDER_SITE_OTHER): Payer: Medicare Other | Admitting: Cardiology

## 2014-04-16 VITALS — BP 147/71 | HR 36 | Ht 68.0 in | Wt 183.8 lb

## 2014-04-16 DIAGNOSIS — I4949 Other premature depolarization: Secondary | ICD-10-CM

## 2014-04-16 DIAGNOSIS — I5022 Chronic systolic (congestive) heart failure: Secondary | ICD-10-CM

## 2014-04-16 DIAGNOSIS — I493 Ventricular premature depolarization: Secondary | ICD-10-CM

## 2014-04-16 MED ORDER — LISINOPRIL 2.5 MG PO TABS: 2.5000 mg | ORAL_TABLET | Freq: Every day | ORAL | Status: DC

## 2014-04-16 NOTE — Progress Notes (Signed)
Clinical Summary Mr. Crepeau is a 68 y.o.male seen today for follow up of the following medical problems.   1. Chronic Systolic/Diastolic heart failure  - echo which showed LVEF 40-45%, mid-inferolateral wall hypokinesis, and grade II diastolic dysfunction.  - completed lexiscan which showed moderate sized moderate to severe intensity inferolateral wall defect and small apical to mid inferior wall defect. Both areas were fixed and consistent with scar.  - denies any SOB, DOE, orthopnea, PND, or LE edema   - last visit increased coreg to 12.5 mg bid. Describes some dizziness with standing, one episode where he lost his balance and fell after standing up out of his car. No syncope.  - he repprts he is on 2 alpha blockers by his urologist, needed for his prostate.     Past Medical History  Diagnosis Date  . Hyperlipidemia   . Combined systolic and diastolic heart failure      Allergies  Allergen Reactions  . Neosporin [Neomycin-Bacitracin Zn-Polymyx] Rash     Current Outpatient Prescriptions  Medication Sig Dispense Refill  . albuterol (PROAIR HFA) 108 (90 BASE) MCG/ACT inhaler Inhale 1-2 puffs into the lungs every 6 (six) hours as needed for wheezing or shortness of breath.  1 Inhaler  2  . aspirin 81 MG chewable tablet Chew 81 mg by mouth daily.      Marland Kitchen azithromycin (ZITHROMAX) 250 MG tablet Take as directed  6 tablet  0  . carvedilol (COREG) 12.5 MG tablet Take 1 tablet (12.5 mg total) by mouth 2 (two) times daily.  60 tablet  6  . clindamycin (CLINDAGEL) 1 % gel Apply 1 application topically 2 (two) times daily as needed.      . Clobetasol Prop Emollient Base (CLOBETASOL PROPIONATE E) 0.05 % emollient cream Apply 1 Tube topically 2 (two) times daily.  60 g  3  . doxazosin (CARDURA) 4 MG tablet Take 1 tablet (4 mg total) by mouth daily.  30 tablet  11  . finasteride (PROSCAR) 5 MG tablet Take 1 tablet by mouth daily.      . fluticasone (CUTIVATE) 0.05 % cream Apply topically  2 (two) times daily.  15 g  5  . gabapentin (NEURONTIN) 300 MG capsule TAKE 1 CAPSULE (300 MG TOTAL) BY MOUTH 2 (TWO) TIMES DAILY.  60 capsule  3  . hydroquinone 4 % cream Apply 1 application topically 2 (two) times daily.      Marland Kitchen lisinopril (PRINIVIL,ZESTRIL) 5 MG tablet Take 1 tablet (5 mg total) by mouth daily.  30 tablet  6  . meloxicam (MOBIC) 15 MG tablet Take 1 tablet (15 mg total) by mouth daily.  30 tablet  11  . pravastatin (PRAVACHOL) 40 MG tablet TAKE 1 TABLET (40 MG TOTAL) BY MOUTH DAILY.  30 tablet  3  . retapamulin (ALTABAX) 1 % ointment Apply 1 application topically 2 (two) times daily as needed.      . sertraline (ZOLOFT) 50 MG tablet TAKE 1 TABLET (50 MG TOTAL) BY MOUTH DAILY.  30 tablet  3  . tamsulosin (FLOMAX) 0.4 MG CAPS capsule Take 2 capsules (0.8 mg total) by mouth daily.  60 capsule  11  . tiotropium (SPIRIVA HANDIHALER) 18 MCG inhalation capsule Place 1 capsule (18 mcg total) into inhaler and inhale daily.  30 capsule  12  . traMADol (ULTRAM) 50 MG tablet TAKE ONE TABLET BY MOUTH TWICE DAILY  60 tablet  0  . WELCHOL 625 MG tablet TAKE 2 TABLETS (  1,250 MG TOTAL) BY MOUTH 2 (TWO) TIMES DAILY WITH A ME AL.  120 tablet  0   No current facility-administered medications for this visit.     Past Surgical History  Procedure Laterality Date  . Tonsillectomy       Allergies  Allergen Reactions  . Neosporin [Neomycin-Bacitracin Zn-Polymyx] Rash      Family History  Problem Relation Age of Onset  . Heart disease Mother   . Diabetes Mother   . COPD Father      Social History Mr. Nicodemus reports that he has never smoked. He has never used smokeless tobacco. Mr. Slovick reports that he does not drink alcohol.   Review of Systems CONSTITUTIONAL: No weight loss, fever, chills, weakness or fatigue.  HEENT: Eyes: No visual loss, blurred vision, double vision or yellow sclerae.No hearing loss, sneezing, congestion, runny nose or sore throat.  SKIN: No rash or  itching.  CARDIOVASCULAR: per HPI RESPIRATORY: No shortness of breath, cough or sputum.  GASTROINTESTINAL: No anorexia, nausea, vomiting or diarrhea. No abdominal pain or blood.  GENITOURINARY: No burning on urination, no polyuria NEUROLOGICAL: No headache, dizziness, syncope, paralysis, ataxia, numbness or tingling in the extremities. No change in bowel or bladder control.  MUSCULOSKELETAL: No muscle, back pain, joint pain or stiffness.  LYMPHATICS: No enlarged nodes. No history of splenectomy.  PSYCHIATRIC: No history of depression or anxiety.  ENDOCRINOLOGIC: No reports of sweating, cold or heat intolerance. No polyuria or polydipsia.  Marland Kitchen   Physical Examination p 40 (EKG rate 70 with PVCs) 147/71 Wt 28 Gen: resting comfortably, no acute distress HEENT: no scleral icterus, pupils equal round and reactive, no palptable cervical adenopathy,  CV: RRR, no m/r/g, no JVD, no carotid bruits Resp: Clear to auscultation bilaterally GI: abdomen is soft, non-tender, non-distended, normal bowel sounds, no hepatosplenomegaly MSK: extremities are warm, no edema.  Skin: warm, no rash Neuro:  no focal deficits Psych: appropriate affect   Diagnostic Studies 10/2013 Echo  Left ventricle: The cavity size was mildly dilated. Wall thickness was normal. Systolic function was mildly to moderately reduced. The estimated ejection fraction is 40%. Features are consistent with a pseudonormal left ventricular filling pattern, with concomitant abnormal relaxation and increased filling pressure (grade 2 diastolic dysfunction). Doppler parameters are consistent with high ventricular filling pressure. - Regional wall motion abnormality: Mild to moderate hypokinesis of the mid inferolateral myocardium. The remaining walls are mildly hypokinetic. - Aortic valve: Trileaflet; mildly thickened, mildly calcified leaflets. There was no stenosis. - Mitral valve: Mildly thickened leaflets . Mild to  moderate regurgitation. - Left atrium: The atrium was moderately dilated. - Tricuspid valve: Mild regurgitation. Inadequate TR jet to accurately assess pulmonary pressures. - Pericardium, extracardiac: There was a left pleural effusion.   11/11/13 Lexiscan MPI  Gated imaging reveals an EDV of 223, ESV of 153, TID ratio 0.99, and LVEF of 31% with global hypokinesis, most prominent in the apex and inferolateral walls.  IMPRESSION: Intermediate risk Lexiscan Cardiolite. No diagnostic ST segment changes were noted to indicate ischemia, frequent PVCs were seen without sustained arrhythmia. Perfusion imaging is most consistent with scar affecting the inferior wall and inferolateral wall as outlined, no large ischemic zones however. LVEF is calculated at 31% with global hypokinesis that is most prominent at the apex and inferior wall. EDV indicates moderate to severe chamber dilatation. Findings are consistent with probable ischemic cardiomyopathy.      Assessment and Plan  1. Systolic/diastolic dysfunction  - fairly new diagnosis for patient, noted  on recent echo 10/2013. LVEF 40-45%, Lexiscan suggests this is an ischemic cardiomyopathy with evidence of prior scar, no active ischemia. He has not been in favor of invasive testing. Given his mild dysfunction will optimize medical therapy, if persistent or progression in dysfunction recondsider invasive testing at that time. Symptomatically he is improving.  - significant dizziness on current regimen, likely due to beta blocker, ACE, and the 2 alpha blockers he is on for his prostate. Asked him to clarify with his urologist the need for both - will decrease lisinopril to 2.5mg  daily  - of note manual pulse low at 40, EKG shows sinus rhythm with PVCs rate 70. Manually/vitals machine not detecting PVCs.   F/u 4 months       Antoine Poche, M.D., F.A.C.C.

## 2014-04-16 NOTE — Patient Instructions (Addendum)
   Decrease Lisinopril to 2.5mg  daily. Continue all other medications.   Your physician wants you to follow up in:  4 months.  You will receive a reminder letter in the mail one-two months in advance.  If you don't receive a letter, please call our office to schedule the follow up appointment.

## 2014-05-03 ENCOUNTER — Other Ambulatory Visit: Payer: Self-pay | Admitting: Family Medicine

## 2014-05-08 ENCOUNTER — Other Ambulatory Visit: Payer: Self-pay | Admitting: Family Medicine

## 2014-05-12 ENCOUNTER — Other Ambulatory Visit: Payer: Self-pay | Admitting: Family Medicine

## 2014-05-13 ENCOUNTER — Telehealth: Payer: Self-pay | Admitting: Family Medicine

## 2014-05-13 NOTE — Telephone Encounter (Signed)
Last ov 03/27/14. Last refill 04/15/14. If approved print and route to nursing station.

## 2014-05-13 NOTE — Telephone Encounter (Signed)
Sent to [facility] for review. 

## 2014-06-01 ENCOUNTER — Encounter: Payer: Self-pay | Admitting: Family Medicine

## 2014-06-01 ENCOUNTER — Ambulatory Visit (INDEPENDENT_AMBULATORY_CARE_PROVIDER_SITE_OTHER): Payer: Medicare Other | Admitting: Family Medicine

## 2014-06-01 ENCOUNTER — Encounter (INDEPENDENT_AMBULATORY_CARE_PROVIDER_SITE_OTHER): Payer: Self-pay

## 2014-06-01 VITALS — BP 144/75 | HR 80 | Temp 98.5°F | Ht 68.0 in | Wt 184.4 lb

## 2014-06-01 DIAGNOSIS — R1032 Left lower quadrant pain: Secondary | ICD-10-CM

## 2014-06-01 DIAGNOSIS — R112 Nausea with vomiting, unspecified: Secondary | ICD-10-CM

## 2014-06-01 MED ORDER — CIPROFLOXACIN HCL 500 MG PO TABS: 500.0000 mg | ORAL_TABLET | Freq: Two times a day (BID) | ORAL | Status: DC

## 2014-06-01 MED ORDER — ONDANSETRON 8 MG PO TBDP: 8.0000 mg | ORAL_TABLET | Freq: Three times a day (TID) | ORAL | Status: DC | PRN

## 2014-06-01 NOTE — Progress Notes (Signed)
   Subjective:    Patient ID: Taylor Cross, male    DOB: May 23, 1946, 68 y.o.   MRN: [id number]  HPI C/o LLQ abdominal tenderness.  He states he feels like he may have eaten too much candy. He states he used to take paragoric in the past when he was younger.  He c/o nausea.  He denies fever.  He c/o uri sx's.  Review of Systems  Constitutional: Negative for fever.  HENT: Negative for ear pain.   Eyes: Negative for discharge.  Respiratory: Negative for cough.   Cardiovascular: Negative for chest pain.  Gastrointestinal: Negative for abdominal distention.  Endocrine: Negative for polyuria.  Genitourinary: Negative for difficulty urinating.  Musculoskeletal: Negative for gait problem and neck pain.  Skin: Negative for color change and rash.  Neurological: Negative for speech difficulty and headaches.  Psychiatric/Behavioral: Negative for agitation.       Objective:    BP 144/75 mmHg  Pulse 80  Temp(Src) 98.5 F (36.9 C) (Oral)  Ht 5\' 8"  (1.727 m)  Wt 184 lb 6.4 oz (83.643 kg)  BMI 28.04 kg/m2 Physical Exam  Constitutional: He is oriented to person, place, and time. He appears well-developed and well-nourished.  HENT:  Head: Normocephalic and atraumatic.  Mouth/Throat: Oropharynx is clear and moist.  Eyes: Pupils are equal, round, and reactive to light.  Neck: Normal range of motion. Neck supple.  Cardiovascular: Normal rate and regular rhythm.   No murmur heard. Pulmonary/Chest: Effort normal and breath sounds normal.  Abdominal: Soft. Bowel sounds are normal. There is tenderness.  TTP LLQ w/o guarding  Neurological: He is alert and oriented to person, place, and time.  Skin: Skin is warm and dry.  Psychiatric: He has a normal mood and affect.          Assessment & Plan:     ICD-9-CM ICD-10-CM   1. Left lower quadrant pain 789.04 R10.32 ciprofloxacin (CIPRO) 500 MG tablet  2. Nausea and vomiting, vomiting of unspecified type 787.01 R11.2 ondansetron (ZOFRAN ODT) 8  MG disintegrating tablet     Return if symptoms worsen or fail to improve.  Deatra Canter FNP

## 2014-06-02 ENCOUNTER — Ambulatory Visit: Payer: Medicare Other | Admitting: Family Medicine

## 2014-06-07 ENCOUNTER — Other Ambulatory Visit: Payer: Self-pay | Admitting: Family Medicine

## 2014-06-14 ENCOUNTER — Other Ambulatory Visit: Payer: Self-pay | Admitting: Family Medicine

## 2014-06-14 NOTE — Telephone Encounter (Signed)
Last filled 05/14/14, last seen 06/01/14. Rx will print

## 2014-06-15 ENCOUNTER — Other Ambulatory Visit: Payer: Self-pay | Admitting: Family Medicine

## 2014-06-15 NOTE — Telephone Encounter (Signed)
RX called in for Tramadol approved by Ander Slade

## 2014-06-15 NOTE — Telephone Encounter (Signed)
Sent to [employer] on 011/16/15, spoke to her on 06/15/14 about it

## 2014-06-15 NOTE — Telephone Encounter (Signed)
Please advise 

## 2014-06-16 NOTE — Telephone Encounter (Signed)
Pt checking on refill presc. For tramidol.

## 2014-06-16 NOTE — Telephone Encounter (Signed)
Pt.notified

## 2014-06-27 ENCOUNTER — Other Ambulatory Visit: Payer: Self-pay | Admitting: Family

## 2014-07-03 ENCOUNTER — Other Ambulatory Visit: Payer: Self-pay | Admitting: Family

## 2014-07-05 NOTE — Telephone Encounter (Signed)
Last lipid 3/15.

## 2014-07-14 ENCOUNTER — Other Ambulatory Visit: Payer: Self-pay | Admitting: Family Medicine

## 2014-07-14 MED ORDER — TRAMADOL HCL 50 MG PO TABS: 50.0000 mg | ORAL_TABLET | Freq: Two times a day (BID) | ORAL | Status: DC

## 2014-07-14 NOTE — Telephone Encounter (Signed)
Last filled 06/15/14, last seen 06/01/14. Rx will print

## 2014-07-25 ENCOUNTER — Other Ambulatory Visit: Payer: Self-pay | Admitting: Family Medicine

## 2014-07-26 ENCOUNTER — Other Ambulatory Visit: Payer: Self-pay | Admitting: *Deleted

## 2014-07-26 MED ORDER — MELOXICAM 15 MG PO TABS: ORAL_TABLET | ORAL | Status: DC

## 2014-07-26 NOTE — Telephone Encounter (Signed)
Pt requesting refill on Meloxicam 15mg  #30 1 PO QD, last seen 06/01/14, last ordered 06/28/14, if approved will be sent electronically to [facility].

## 2014-08-11 ENCOUNTER — Encounter: Payer: Self-pay | Admitting: Cardiology

## 2014-08-11 ENCOUNTER — Ambulatory Visit (INDEPENDENT_AMBULATORY_CARE_PROVIDER_SITE_OTHER): Payer: Medicare Other | Admitting: Cardiology

## 2014-08-11 VITALS — BP 152/78 | HR 70 | Ht 68.0 in | Wt 194.0 lb

## 2014-08-11 DIAGNOSIS — I5042 Chronic combined systolic (congestive) and diastolic (congestive) heart failure: Secondary | ICD-10-CM

## 2014-08-11 NOTE — Patient Instructions (Signed)
Your physician wants you to follow-up in: 6 months with Dr. Branch. You will receive a reminder letter in the mail two months in advance. If you don't receive a letter, please call our office to schedule the follow-up appointment.  Your physician recommends that you continue on your current medications as directed. Please refer to the Current Medication list given to you today.  Your physician has requested that you have an echocardiogram. Echocardiography is a painless test that uses sound waves to create images of your heart. It provides your doctor with information about the size and shape of your heart and how well your heart's chambers and valves are working. This procedure takes approximately one hour. There are no restrictions for this procedure.  Thank you for choosing [facility]!   

## 2014-08-11 NOTE — Progress Notes (Signed)
Clinical Summary Taylor Cross is a 69 y.o.male seen today for follow up of the following medical problems.   1. Chronic Systolic/Diastolic heart failure  - echo which showed LVEF 40-45%, mid-inferolateral wall hypokinesis, and grade II diastolic dysfunction.  - completed lexiscan which showed moderate sized moderate to severe intensity inferolateral wall defect and small apical to mid inferior wall defect. Both areas were fixed and consistent with scar.  - denies any SOB, DOE, orthopnea, PND, or LE edema. Had some dizziness at prior visits after increase of beta blocker however that has since resolved after we decreased her ACE-I. - compliant with meds - limiting sodium intake.      Past Medical History  Diagnosis Date  . Hyperlipidemia   . Combined systolic and diastolic heart failure      Allergies  Allergen Reactions  . Neosporin [Neomycin-Bacitracin Zn-Polymyx] Rash     Current Outpatient Prescriptions  Medication Sig Dispense Refill  . albuterol (PROAIR HFA) 108 (90 BASE) MCG/ACT inhaler Inhale 1-2 puffs into the lungs every 6 (six) hours as needed for wheezing or shortness of breath. 1 Inhaler 2  . aspirin 81 MG chewable tablet Chew 81 mg by mouth daily.    . carvedilol (COREG) 12.5 MG tablet Take 1 tablet (12.5 mg total) by mouth 2 (two) times daily. 60 tablet 6  . ciprofloxacin (CIPRO) 500 MG tablet Take 1 tablet (500 mg total) by mouth 2 (two) times daily. 14 tablet 0  . clindamycin (CLINDAGEL) 1 % gel Apply 1 application topically 2 (two) times daily as needed.    . Clobetasol Prop Emollient Base (CLOBETASOL PROPIONATE E) 0.05 % emollient cream Apply 1 Tube topically 2 (two) times daily. 60 g 3  . doxazosin (CARDURA) 4 MG tablet Take 1 tablet (4 mg total) by mouth daily. 30 tablet 11  . finasteride (PROSCAR) 5 MG tablet Take 1 tablet by mouth daily.    . fluticasone (CUTIVATE) 0.05 % cream Apply topically 2 (two) times daily. 15 g 5  . gabapentin (NEURONTIN) 300  MG capsule TAKE ONE CAPSULE BY MOUTH TWICE DAILY 60 capsule 2  . hydroquinone 4 % cream Apply 1 application topically 2 (two) times daily.    Marland Kitchen lisinopril (PRINIVIL,ZESTRIL) 2.5 MG tablet Take 1 tablet (2.5 mg total) by mouth daily. 30 tablet 6  . meloxicam (MOBIC) 15 MG tablet TAKE 1 TABLET (15 MG TOTAL) BY MOUTH DAILY. 30 tablet 0  . ondansetron (ZOFRAN ODT) 8 MG disintegrating tablet Take 1 tablet (8 mg total) by mouth every 8 (eight) hours as needed for nausea or vomiting. 20 tablet 0  . pravastatin (PRAVACHOL) 40 MG tablet TAKE ONE TABLET BY MOUTH ONE TIME DAILY 30 tablet 2  . retapamulin (ALTABAX) 1 % ointment Apply 1 application topically 2 (two) times daily as needed.    . sertraline (ZOLOFT) 50 MG tablet TAKE ONE TABLET BY MOUTH ONE TIME DAILY 30 tablet 2  . SPIRIVA HANDIHALER 18 MCG inhalation capsule     . tamsulosin (FLOMAX) 0.4 MG CAPS capsule Take 2 capsules (0.8 mg total) by mouth daily. 60 capsule 11  . traMADol (ULTRAM) 50 MG tablet Take 1 tablet (50 mg total) by mouth 2 (two) times daily. 60 tablet 3  . WELCHOL 625 MG tablet TAKE 2 TABLETS BY MOUTH TWICE A DAY WITH A MEAL 120 tablet 0   No current facility-administered medications for this visit.     Past Surgical History  Procedure Laterality Date  . Tonsillectomy  Allergies  Allergen Reactions  . Neosporin [Neomycin-Bacitracin Zn-Polymyx] Rash      Family History  Problem Relation Age of Onset  . Heart disease Mother   . Diabetes Mother   . COPD Father      Social History Mr. Ryback reports that he has never smoked. He has never used smokeless tobacco. Mr. Divita reports that he does not drink alcohol.   Review of Systems CONSTITUTIONAL: No weight loss, fever, chills, weakness or fatigue.  HEENT: Eyes: No visual loss, blurred vision, double vision or yellow sclerae.No hearing loss, sneezing, congestion, runny nose or sore throat.  SKIN: No rash or itching.  CARDIOVASCULAR: per HPI RESPIRATORY:  No shortness of breath, cough or sputum.  GASTROINTESTINAL: No anorexia, nausea, vomiting or diarrhea. No abdominal pain or blood.  GENITOURINARY: No burning on urination, no polyuria NEUROLOGICAL: No headache, dizziness, syncope, paralysis, ataxia, numbness or tingling in the extremities. No change in bowel or bladder control.  MUSCULOSKELETAL: No muscle, back pain, joint pain or stiffness.  LYMPHATICS: No enlarged nodes. No history of splenectomy.  PSYCHIATRIC: No history of depression or anxiety.  ENDOCRINOLOGIC: No reports of sweating, cold or heat intolerance. No polyuria or polydipsia.  Marland Kitchen   Physical Examination Gen: resting comfortably, no acute distress HEENT: no scleral icterus, pupils equal round and reactive, no palptable cervical adenopathy,  CV: RRR, no m/r/g, no JVD, no carotid brutis Resp: Clear to auscultation bilaterally GI: abdomen is soft, non-tender, non-distended, normal bowel sounds, no hepatosplenomegaly MSK: extremities are warm, no edema.  Skin: warm, no rash Neuro:  no focal deficits Psych: appropriate affect   Diagnostic Studies 10/2013 Echo  Left ventricle: The cavity size was mildly dilated. Wall thickness was normal. Systolic function was mildly to moderately reduced. The estimated ejection fraction is 40%. Features are consistent with a pseudonormal left ventricular filling pattern, with concomitant abnormal relaxation and increased filling pressure (grade 2 diastolic dysfunction). Doppler parameters are consistent with high ventricular filling pressure. - Regional wall motion abnormality: Mild to moderate hypokinesis of the mid inferolateral myocardium. The remaining walls are mildly hypokinetic. - Aortic valve: Trileaflet; mildly thickened, mildly calcified leaflets. There was no stenosis. - Mitral valve: Mildly thickened leaflets . Mild to moderate regurgitation. - Left atrium: The atrium was moderately dilated. - Tricuspid valve: Mild  regurgitation. Inadequate TR jet to accurately assess pulmonary pressures. - Pericardium, extracardiac: There was a left pleural effusion.   11/11/13 Lexiscan MPI  Gated imaging reveals an EDV of 223, ESV of 153, TID ratio 0.99, and LVEF of 31% with global hypokinesis, most prominent in the apex and inferolateral walls.  IMPRESSION: Intermediate risk Lexiscan Cardiolite. No diagnostic ST segment changes were noted to indicate ischemia, frequent PVCs were seen without sustained arrhythmia. Perfusion imaging is most consistent with scar affecting the inferior wall and inferolateral wall as outlined, no large ischemic zones however. LVEF is calculated at 31% with global hypokinesis that is most prominent at the apex and inferior wall. EDV indicates moderate to severe chamber dilatation. Findings are consistent with probable ischemic cardiomyopathy.    Assessment and Plan   1.Chronic Systolic/diastolic heart failure - Echo 01/8294. LVEF 40-45%, Lexiscan suggests this is an ischemic cardiomyopathy with evidence of prior scar, no active ischemia. He has not been in favor of invasive testing. Given his mild dysfunction will optimize medical therapy, if persistent or progression in dysfunction recondsider invasive testing at that time.  - no current symptoms. Further medication titration limited due to severe dizziness - will repeat  echo      Antoine Poche, M.D.

## 2014-08-16 ENCOUNTER — Other Ambulatory Visit: Payer: Self-pay | Admitting: Family Medicine

## 2014-08-16 NOTE — Telephone Encounter (Signed)
Last filled 07/14/14, last seen 06/01/14. Rx will print

## 2014-08-17 MED ORDER — TRAMADOL HCL 50 MG PO TABS: 50.0000 mg | ORAL_TABLET | Freq: Two times a day (BID) | ORAL | Status: DC

## 2014-08-19 ENCOUNTER — Other Ambulatory Visit: Payer: Medicare Other

## 2014-08-23 ENCOUNTER — Other Ambulatory Visit: Payer: Self-pay | Admitting: *Deleted

## 2014-08-23 ENCOUNTER — Other Ambulatory Visit: Payer: Self-pay | Admitting: Family Medicine

## 2014-08-23 MED ORDER — CARVEDILOL 12.5 MG PO TABS: 12.5000 mg | ORAL_TABLET | Freq: Two times a day (BID) | ORAL | Status: DC

## 2014-08-25 ENCOUNTER — Other Ambulatory Visit: Payer: Medicare Other

## 2014-09-02 ENCOUNTER — Other Ambulatory Visit: Payer: Self-pay

## 2014-09-02 ENCOUNTER — Other Ambulatory Visit (INDEPENDENT_AMBULATORY_CARE_PROVIDER_SITE_OTHER): Payer: Medicare Other

## 2014-09-02 DIAGNOSIS — I5042 Chronic combined systolic (congestive) and diastolic (congestive) heart failure: Secondary | ICD-10-CM

## 2014-09-02 DIAGNOSIS — I519 Heart disease, unspecified: Secondary | ICD-10-CM

## 2014-09-02 DIAGNOSIS — R0600 Dyspnea, unspecified: Secondary | ICD-10-CM

## 2014-09-06 ENCOUNTER — Telehealth: Payer: Self-pay | Admitting: *Deleted

## 2014-09-06 NOTE — Telephone Encounter (Signed)
-----   Message from Antoine Poche, MD sent at 09/03/2014  3:50 PM EST ----- Echo shows heart function remains stable from prior study, it is still mildly decreased. We will continue current meds. F/u 2 months to discuss further   Dominga Ferry MD

## 2014-09-06 NOTE — Telephone Encounter (Signed)
Pt made aware, forwarded to Dr. Christell Constant, f/u appt made 4/15

## 2014-09-14 ENCOUNTER — Other Ambulatory Visit: Payer: Self-pay | Admitting: Family Medicine

## 2014-09-14 MED ORDER — FLUTICASONE PROPIONATE 50 MCG/ACT NA SUSP: 2.0000 | Freq: Every day | NASAL | Status: DC

## 2014-09-14 MED ORDER — TRAMADOL HCL 50 MG PO TABS: 50.0000 mg | ORAL_TABLET | Freq: Two times a day (BID) | ORAL | Status: DC

## 2014-09-14 NOTE — Telephone Encounter (Signed)
Last filled Tramadol on 08/17/14 for #60. If approved print and route to Pool A to call patient.

## 2014-09-15 NOTE — Telephone Encounter (Signed)
Aware,script for pain medication ready. 

## 2014-09-25 ENCOUNTER — Other Ambulatory Visit: Payer: Self-pay | Admitting: Family Medicine

## 2014-09-28 ENCOUNTER — Other Ambulatory Visit: Payer: Self-pay | Admitting: Family Medicine

## 2014-09-30 ENCOUNTER — Ambulatory Visit (INDEPENDENT_AMBULATORY_CARE_PROVIDER_SITE_OTHER): Payer: Medicare Other | Admitting: Family Medicine

## 2014-09-30 VITALS — BP 152/79 | HR 69 | Temp 97.4°F | Ht 68.0 in | Wt 194.4 lb

## 2014-09-30 DIAGNOSIS — I5040 Unspecified combined systolic (congestive) and diastolic (congestive) heart failure: Secondary | ICD-10-CM

## 2014-09-30 DIAGNOSIS — I10 Essential (primary) hypertension: Secondary | ICD-10-CM

## 2014-09-30 DIAGNOSIS — E785 Hyperlipidemia, unspecified: Secondary | ICD-10-CM | POA: Diagnosis not present

## 2014-09-30 DIAGNOSIS — R5383 Other fatigue: Secondary | ICD-10-CM | POA: Diagnosis not present

## 2014-09-30 DIAGNOSIS — M1990 Unspecified osteoarthritis, unspecified site: Secondary | ICD-10-CM | POA: Diagnosis not present

## 2014-09-30 MED ORDER — DICLOFENAC SODIUM 75 MG PO TBEC
75.0000 mg | DELAYED_RELEASE_TABLET | Freq: Two times a day (BID) | ORAL | Status: DC
Start: 2014-09-30 — End: 2014-10-06

## 2014-09-30 NOTE — Progress Notes (Signed)
Subjective:  Patient ID: Taylor Cross, male    DOB: 1945-12-03  Age: 69 y.o. MRN: [id number]  CC: Hyperlipidemia and Congestive Heart Failure   HPI [name] presents for chart of today there is concern for weight gain he is put on about 10-20 pounds. We discussed the fact that his joints hurt and it's harder for him to exercise. Primarily his back and neck hurt. He mentions bone spurs in the neck. Additionally his blood pressure is up a bit and he feels that that's probably related to his recent weight gain as well. Patient expresses concern that it's time to check his cholesterol and the accompanying liver functions. He has seen his cardiologist recently. Cardiologist told him to follow a low-salt approach to diet. Limiting him to 2000 mg daily. Additionally the cardiologist mentioned that he had had a heart attack that had weakened his heart at some point. However the cardiologist thinks it will get stronger.    History Rolfe has a past medical history of Hyperlipidemia and Combined systolic and diastolic heart failure.   He has past surgical history that includes Tonsillectomy.   His family history includes COPD in his father; Diabetes in his mother; Heart disease in his mother.He reports that he has never smoked. He has never used smokeless tobacco. He reports that he does not drink alcohol or use illicit drugs.  Current Outpatient Prescriptions on File Prior to Visit  Medication Sig Dispense Refill  . aspirin 81 MG chewable tablet Chew 81 mg by mouth daily.    . carvedilol (COREG) 12.5 MG tablet Take 1 tablet (12.5 mg total) by mouth 2 (two) times daily. 60 tablet 6  . Clobetasol Prop Emollient Base (CLOBETASOL PROPIONATE E) 0.05 % emollient cream Apply 1 Tube topically 2 (two) times daily. 60 g 3  . doxazosin (CARDURA) 4 MG tablet Take 1 tablet (4 mg total) by mouth daily. 30 tablet 11  . finasteride (PROSCAR) 5 MG tablet Take 1 tablet by mouth daily.    . fluticasone  (FLONASE) 50 MCG/ACT nasal spray Place 2 sprays into both nostrils daily. 16 g 2  . gabapentin (NEURONTIN) 300 MG capsule TAKE ONE CAPSULE BY MOUTH TWICE DAILY 60 capsule 0  . pravastatin (PRAVACHOL) 40 MG tablet TAKE ONE TABLET BY MOUTH ONE TIME DAILY 30 tablet 0  . sertraline (ZOLOFT) 50 MG tablet TAKE ONE TABLET BY MOUTH ONE TIME DAILY 30 tablet 0  . tamsulosin (FLOMAX) 0.4 MG CAPS capsule Take 2 capsules (0.8 mg total) by mouth daily. 60 capsule 11  . traMADol (ULTRAM) 50 MG tablet Take 1 tablet (50 mg total) by mouth 2 (two) times daily. 60 tablet 0  . hydroquinone 4 % cream Apply 1 application topically 2 (two) times daily.    Marland Kitchen lisinopril (PRINIVIL,ZESTRIL) 2.5 MG tablet Take 1 tablet (2.5 mg total) by mouth daily. 30 tablet 6  . meloxicam (MOBIC) 15 MG tablet TAKE 1 TABLET (15 MG TOTAL) BY MOUTH DAILY. 30 tablet 1  . WELCHOL 625 MG tablet TAKE 2 TABLETS BY MOUTH TWICE A DAY WITH A MEAL 120 tablet 2   No current facility-administered medications on file prior to visit.    ROS Review of Systems  Constitutional: Negative for fever, chills, diaphoresis and unexpected weight change.  HENT: Negative for congestion, hearing loss, rhinorrhea, sore throat and trouble swallowing.   Respiratory: Negative for cough, chest tightness, shortness of breath and wheezing.   Gastrointestinal: Negative for nausea, vomiting, abdominal pain, diarrhea, constipation  and abdominal distention.  Endocrine: Negative for cold intolerance and heat intolerance.  Genitourinary: Negative for dysuria, hematuria and flank pain.  Musculoskeletal: Negative for joint swelling and arthralgias.  Skin: Negative for rash.  Neurological: Negative for dizziness and headaches.  Psychiatric/Behavioral: Negative for dysphoric mood, decreased concentration and agitation. The patient is not nervous/anxious.     Objective:  BP 152/79 mmHg  Pulse 69  Temp(Src) 97.4 F (36.3 C) (Oral)  Ht 5' 8"  (1.727 m)  Wt 194 lb 6.4 oz  (88.179 kg)  BMI 29.57 kg/m2  BP Readings from Last 3 Encounters:  09/30/14 152/79  08/11/14 152/78  06/01/14 144/75    Wt Readings from Last 3 Encounters:  09/30/14 194 lb 6.4 oz (88.179 kg)  08/11/14 194 lb (87.998 kg)  06/01/14 184 lb 6.4 oz (83.643 kg)     Physical Exam  Constitutional: He is oriented to person, place, and time. He appears well-developed and well-nourished. No distress.  HENT:  Head: Normocephalic and atraumatic.  Right Ear: External ear normal.  Left Ear: External ear normal.  Nose: Nose normal.  Mouth/Throat: Oropharynx is clear and moist.  Eyes: Conjunctivae and EOM are normal. Pupils are equal, round, and reactive to light.  Neck: Normal range of motion. Neck supple. No thyromegaly present.  Cardiovascular: Normal rate, regular rhythm and normal heart sounds.   No murmur heard. Pulmonary/Chest: Effort normal and breath sounds normal. No respiratory distress. He has no wheezes. He has no rales.  Abdominal: Soft. Bowel sounds are normal. He exhibits no distension. There is no tenderness.  Lymphadenopathy:    He has no cervical adenopathy.  Neurological: He is alert and oriented to person, place, and time. He has normal reflexes.  Skin: Skin is warm and dry.  Psychiatric: He has a normal mood and affect. His behavior is normal. Judgment and thought content normal.    No results found for: HGBA1C  Lab Results  Component Value Date   WBC 7.3 10/06/2013   HGB 13.0* 10/06/2013   HCT 42.1* 10/06/2013   GLUCOSE 96 09/30/2014   CHOL 159 09/30/2014   TRIG 143 09/30/2014   HDL 49 09/30/2014   LDLCALC 74 10/06/2013   ALT 14 09/30/2014   AST 16 09/30/2014   NA 141 09/30/2014   K 4.1 09/30/2014   CL 102 09/30/2014   CREATININE 1.02 09/30/2014   BUN 22 09/30/2014   CO2 25 09/30/2014   TSH 1.910 10/06/2013    No results found.  Assessment & Plan:   Harvir was seen today for hyperlipidemia and congestive heart failure.  Diagnoses and all orders  for this visit:  Hyperlipemia Orders: -     CMP14+EGFR -     NMR, lipoprofile  Combined systolic and diastolic congestive heart failure, unspecified congestive heart failure chronicity Orders: -     CMP14+EGFR  Other fatigue Orders: -     Cancel: POCT CBC  Essential hypertension  Arthritis  Other orders -     diclofenac (VOLTAREN) 75 MG EC tablet; Take 1 tablet (75 mg total) by mouth 2 (two) times daily.   I have discontinued Mr. Caroll's clindamycin, retapamulin, albuterol, SPIRIVA HANDIHALER, and ondansetron. I am also having him start on diclofenac. Additionally, I am having him maintain his hydroquinone, aspirin, doxazosin, tamsulosin, Clobetasol Prop Emollient Base, finasteride, lisinopril, WELCHOL, meloxicam, carvedilol, traMADol, fluticasone, pravastatin, gabapentin, and sertraline.  Meds ordered this encounter  Medications  . diclofenac (VOLTAREN) 75 MG EC tablet    Sig: Take 1 tablet (75  mg total) by mouth 2 (two) times daily.    Dispense:  60 tablet    Refill:  2      Follow-up: Return in about 6 months (around 04/02/2015) for hypertension, CPE.  Claretta Fraise, M.D.

## 2014-09-30 NOTE — Patient Instructions (Signed)
DASH Eating Plan °DASH stands for "Dietary Approaches to Stop Hypertension." The DASH eating plan is a healthy eating plan that has been shown to reduce high blood pressure (hypertension). Additional health benefits may include reducing the risk of type 2 diabetes mellitus, heart disease, and stroke. The DASH eating plan may also help with weight loss. °WHAT DO I NEED TO KNOW ABOUT THE DASH EATING PLAN? °For the DASH eating plan, you will follow these general guidelines: °· Choose foods with a percent daily value for sodium of less than 5% (as listed on the food label). °· Use salt-free seasonings or herbs instead of table salt or sea salt. °· Check with your health care provider or pharmacist before using salt substitutes. °· Eat lower-sodium products, often labeled as "lower sodium" or "no salt added." °· Eat fresh foods. °· Eat more vegetables, fruits, and low-fat dairy products. °· Choose whole grains. Look for the word "whole" as the first word in the ingredient list. °· Choose fish and skinless chicken or turkey more often than red meat. Limit fish, poultry, and meat to 6 oz (170 g) each day. °· Limit sweets, desserts, sugars, and sugary drinks. °· Choose heart-healthy fats. °· Limit cheese to 1 oz (28 g) per day. °· Eat more home-cooked food and less restaurant, buffet, and fast food. °· Limit fried foods. °· Cook foods using methods other than frying. °· Limit canned vegetables. If you do use them, rinse them well to decrease the sodium. °· When eating at a restaurant, ask that your food be prepared with less salt, or no salt if possible. °WHAT FOODS CAN I EAT? °Seek help from a dietitian for individual calorie needs. °Grains °Whole grain or whole wheat bread. Brown rice. Whole grain or whole wheat pasta. Quinoa, bulgur, and whole grain cereals. Low-sodium cereals. Corn or whole wheat flour tortillas. Whole grain cornbread. Whole grain crackers. Low-sodium crackers. °Vegetables °Fresh or frozen vegetables  (raw, steamed, roasted, or grilled). Low-sodium or reduced-sodium tomato and vegetable juices. Low-sodium or reduced-sodium tomato sauce and paste. Low-sodium or reduced-sodium canned vegetables.  °Fruits °All fresh, canned (in natural juice), or frozen fruits. °Meat and Other Protein Products °Ground beef (85% or leaner), grass-fed beef, or beef trimmed of fat. Skinless chicken or turkey. Ground chicken or turkey. Pork trimmed of fat. All fish and seafood. Eggs. Dried beans, peas, or lentils. Unsalted nuts and seeds. Unsalted canned beans. °Dairy °Low-fat dairy products, such as skim or 1% milk, 2% or reduced-fat cheeses, low-fat ricotta or cottage cheese, or plain low-fat yogurt. Low-sodium or reduced-sodium cheeses. °Fats and Oils °Tub margarines without trans fats. Light or reduced-fat mayonnaise and salad dressings (reduced sodium). Avocado. Safflower, olive, or canola oils. Natural peanut or almond butter. °Other °Unsalted popcorn and pretzels. °The items listed above may not be a complete list of recommended foods or beverages. Contact your dietitian for more options. °WHAT FOODS ARE NOT RECOMMENDED? °Grains °White bread. White pasta. White rice. Refined cornbread. Bagels and croissants. Crackers that contain trans fat. °Vegetables °Creamed or fried vegetables. Vegetables in a cheese sauce. Regular canned vegetables. Regular canned tomato sauce and paste. Regular tomato and vegetable juices. °Fruits °Dried fruits. Canned fruit in light or heavy syrup. Fruit juice. °Meat and Other Protein Products °Fatty cuts of meat. Ribs, chicken wings, bacon, sausage, bologna, salami, chitterlings, fatback, hot dogs, bratwurst, and packaged luncheon meats. Salted nuts and seeds. Canned beans with salt. °Dairy °Whole or 2% milk, cream, half-and-half, and cream cheese. Whole-fat or sweetened yogurt. Full-fat   cheeses or blue cheese. Nondairy creamers and whipped toppings. Processed cheese, cheese spreads, or cheese  curds. °Condiments °Onion and garlic salt, seasoned salt, table salt, and sea salt. Canned and packaged gravies. Worcestershire sauce. Tartar sauce. Barbecue sauce. Teriyaki sauce. Soy sauce, including reduced sodium. Steak sauce. Fish sauce. Oyster sauce. Cocktail sauce. Horseradish. Ketchup and mustard. Meat flavorings and tenderizers. Bouillon cubes. Hot sauce. Tabasco sauce. Marinades. Taco seasonings. Relishes. °Fats and Oils °Butter, stick margarine, lard, shortening, ghee, and bacon fat. Coconut, palm kernel, or palm oils. Regular salad dressings. °Other °Pickles and olives. Salted popcorn and pretzels. °The items listed above may not be a complete list of foods and beverages to avoid. Contact your dietitian for more information. °WHERE CAN I FIND MORE INFORMATION? °[employer], [employer], [employer]: www.nhlbi.nih.gov/health/health-topics/topics/dash/ °Document Released: 07/05/2011 Document Revised: 11/30/2013 Document Reviewed: 05/20/2013 °[employer]® Patient Information ©2015 [employer]. This information is not intended to replace advice given to you by your health care provider. Make sure you discuss any questions you have with your health care provider. ° °

## 2014-10-01 LAB — NMR, LIPOPROFILE
CHOLESTEROL: 159 mg/dL (ref 100–199)
HDL CHOLESTEROL BY NMR: 49 mg/dL (ref >39)
HDL PARTICLE NUMBER: 42.4 umol/L (ref >=30.5)
LDL Particle Number: 1147 nmol/L — ABNORMAL HIGH (ref <1000)
LDL Size: 20.4 nm (ref >20.5)
LDL-C: 81 mg/dL (ref 0–99)
LP-IR Score: 63 — ABNORMAL HIGH (ref <=45)
Small LDL Particle Number: 529 nmol/L — ABNORMAL HIGH (ref <=527)
TRIGLYCERIDES BY NMR: 143 mg/dL (ref 0–149)

## 2014-10-01 LAB — CMP14+EGFR
ALBUMIN: 4.5 g/dL (ref 3.6–4.8)
ALT: 14 IU/L (ref 0–44)
AST: 16 IU/L (ref 0–40)
Albumin/Globulin Ratio: 2 (ref 1.1–2.5)
Alkaline Phosphatase: 69 IU/L (ref 39–117)
BUN/Creatinine Ratio: 22 (ref 10–22)
BUN: 22 mg/dL (ref 8–27)
Bilirubin Total: 0.6 mg/dL (ref 0.0–1.2)
CALCIUM: 9.4 mg/dL (ref 8.6–10.2)
CO2: 25 mmol/L (ref 18–29)
Chloride: 102 mmol/L (ref 97–108)
Creatinine, Ser: 1.02 mg/dL (ref 0.76–1.27)
GFR calc Af Amer: 87 mL/min/{1.73_m2} (ref >59)
GFR, EST NON AFRICAN AMERICAN: 75 mL/min/{1.73_m2} (ref >59)
GLUCOSE: 96 mg/dL (ref 65–99)
Globulin, Total: 2.2 g/dL (ref 1.5–4.5)
POTASSIUM: 4.1 mmol/L (ref 3.5–5.2)
Sodium: 141 mmol/L (ref 134–144)
TOTAL PROTEIN: 6.7 g/dL (ref 6.0–8.5)

## 2014-10-06 ENCOUNTER — Telehealth: Payer: Self-pay | Admitting: Family Medicine

## 2014-10-06 NOTE — Telephone Encounter (Signed)
Spoke with pt regarding Voltaren It caused stomach pain and GI upset Pt restarted Meloxicam Noted in allergies

## 2014-10-14 ENCOUNTER — Telehealth: Payer: Self-pay | Admitting: Family Medicine

## 2014-10-14 NOTE — Telephone Encounter (Signed)
Last filled 10/13/14, last seen 09/30/14

## 2014-10-15 ENCOUNTER — Other Ambulatory Visit: Payer: Self-pay | Admitting: *Deleted

## 2014-10-15 ENCOUNTER — Other Ambulatory Visit: Payer: Self-pay | Admitting: Family Medicine

## 2014-10-15 MED ORDER — TRAMADOL HCL 50 MG PO TABS: 50.0000 mg | ORAL_TABLET | Freq: Two times a day (BID) | ORAL | Status: DC

## 2014-10-15 NOTE — Telephone Encounter (Signed)
Please review and advisest

## 2014-10-15 NOTE — Progress Notes (Signed)
RX okayed per Dr Darlyn Read Pt notified

## 2014-10-15 NOTE — Telephone Encounter (Signed)
Okay X 1

## 2014-10-15 NOTE — Telephone Encounter (Signed)
It is on my desk, in my outbox, signed and ready to go. Thanks, WS

## 2014-10-25 ENCOUNTER — Other Ambulatory Visit: Payer: Self-pay | Admitting: Family Medicine

## 2014-10-30 ENCOUNTER — Other Ambulatory Visit: Payer: Self-pay | Admitting: Cardiology

## 2014-11-10 ENCOUNTER — Telehealth: Payer: Self-pay | Admitting: Family Medicine

## 2014-11-11 ENCOUNTER — Other Ambulatory Visit: Payer: Self-pay | Admitting: Family Medicine

## 2014-11-12 ENCOUNTER — Ambulatory Visit: Payer: Medicare Other | Admitting: Cardiology

## 2014-11-12 ENCOUNTER — Telehealth: Payer: Self-pay | Admitting: Family Medicine

## 2014-11-12 MED ORDER — TRAMADOL HCL 50 MG PO TABS: 50.0000 mg | ORAL_TABLET | Freq: Two times a day (BID) | ORAL | Status: DC

## 2014-11-12 NOTE — Progress Notes (Unsigned)
Clinical Summary Mr. Albea is a 69 y.o.male seen today for follow up of the following medical problems.   1. Chronic Systolic/Diastolic heart failure  - echo which showed LVEF 40-45%, mid-inferolateral wall hypokinesis, and grade II diastolic dysfunction.  - completed lexiscan which showed moderate sized moderate to severe intensity inferolateral wall defect and small apical to mid inferior wall defect. Both areas were fixed and consistent with scar.  - denies any SOB, DOE, orthopnea, PND, or LE edema. Had some dizziness at prior visits after increase of beta blocker however that has since resolved after we decreased her ACE-I. - compliant with meds - limiting sodium intake.      Past Medical History  Diagnosis Date  . Hyperlipidemia   . Combined systolic and diastolic heart failure      Allergies  Allergen Reactions  . Neosporin [Neomycin-Bacitracin Zn-Polymyx] Rash  . Voltaren [Diclofenac Sodium] Other (See Comments)    GI upset     Current Outpatient Prescriptions  Medication Sig Dispense Refill  . aspirin 81 MG chewable tablet Chew 81 mg by mouth daily.    . carvedilol (COREG) 12.5 MG tablet Take 1 tablet (12.5 mg total) by mouth 2 (two) times daily. 60 tablet 6  . Clobetasol Prop Emollient Base (CLOBETASOL PROPIONATE E) 0.05 % emollient cream Apply 1 Tube topically 2 (two) times daily. 60 g 3  . doxazosin (CARDURA) 4 MG tablet Take 1 tablet (4 mg total) by mouth daily. 30 tablet 11  . finasteride (PROSCAR) 5 MG tablet Take 1 tablet by mouth daily.    . fluticasone (FLONASE) 50 MCG/ACT nasal spray Place 2 sprays into both nostrils daily. 16 g 2  . gabapentin (NEURONTIN) 300 MG capsule TAKE ONE CAPSULE BY MOUTH TWICE DAILY 60 capsule 2  . hydroquinone 4 % cream Apply 1 application topically 2 (two) times daily.    Marland Kitchen lisinopril (PRINIVIL,ZESTRIL) 2.5 MG tablet TAKE ONE TABLET BY MOUTH ONE TIME DAILY 30 tablet 5  . meloxicam (MOBIC) 15 MG tablet TAKE 1 TABLET (15  MG TOTAL) BY MOUTH DAILY. 30 tablet 2  . pravastatin (PRAVACHOL) 40 MG tablet TAKE ONE TABLET BY MOUTH ONE TIME DAILY 30 tablet 5  . sertraline (ZOLOFT) 50 MG tablet TAKE ONE TABLET BY MOUTH ONE TIME DAILY 30 tablet 2  . tamsulosin (FLOMAX) 0.4 MG CAPS capsule Take 2 capsules (0.8 mg total) by mouth daily. 60 capsule 11  . traMADol (ULTRAM) 50 MG tablet Take 1 tablet (50 mg total) by mouth 2 (two) times daily. 60 tablet 0  . WELCHOL 625 MG tablet TAKE 2 TABLETS BY MOUTH TWICE A DAY WITH A MEAL 120 tablet 4   No current facility-administered medications for this visit.     Past Surgical History  Procedure Laterality Date  . Tonsillectomy       Allergies  Allergen Reactions  . Neosporin [Neomycin-Bacitracin Zn-Polymyx] Rash  . Voltaren [Diclofenac Sodium] Other (See Comments)    GI upset      Family History  Problem Relation Age of Onset  . Heart disease Mother   . Diabetes Mother   . COPD Father      Social History Mr. Zavadil reports that he has never smoked. He has never used smokeless tobacco. Mr. Hane reports that he does not drink alcohol.   Review of Systems CONSTITUTIONAL: No weight loss, fever, chills, weakness or fatigue.  HEENT: Eyes: No visual loss, blurred vision, double vision or yellow sclerae.No hearing loss, sneezing, congestion,  runny nose or sore throat.  SKIN: No rash or itching.  CARDIOVASCULAR:  RESPIRATORY: No shortness of breath, cough or sputum.  GASTROINTESTINAL: No anorexia, nausea, vomiting or diarrhea. No abdominal pain or blood.  GENITOURINARY: No burning on urination, no polyuria NEUROLOGICAL: No headache, dizziness, syncope, paralysis, ataxia, numbness or tingling in the extremities. No change in bowel or bladder control.  MUSCULOSKELETAL: No muscle, back pain, joint pain or stiffness.  LYMPHATICS: No enlarged nodes. No history of splenectomy.  PSYCHIATRIC: No history of depression or anxiety.  ENDOCRINOLOGIC: No reports of sweating,  cold or heat intolerance. No polyuria or polydipsia.  Marland Kitchen   Physical Examination There were no vitals filed for this visit. There were no vitals filed for this visit.  Gen: resting comfortably, no acute distress HEENT: no scleral icterus, pupils equal round and reactive, no palptable cervical adenopathy,  CV Resp: Clear to auscultation bilaterally GI: abdomen is soft, non-tender, non-distended, normal bowel sounds, no hepatosplenomegaly MSK: extremities are warm, no edema.  Skin: warm, no rash Neuro:  no focal deficits Psych: appropriate affect   Diagnostic Studies  10/2013 Echo  Left ventricle: The cavity size was mildly dilated. Wall thickness was normal. Systolic function was mildly to moderately reduced. The estimated ejection fraction is 40%. Features are consistent with a pseudonormal left ventricular filling pattern, with concomitant abnormal relaxation and increased filling pressure (grade 2 diastolic dysfunction). Doppler parameters are consistent with high ventricular filling pressure. - Regional wall motion abnormality: Mild to moderate hypokinesis of the mid inferolateral myocardium. The remaining walls are mildly hypokinetic. - Aortic valve: Trileaflet; mildly thickened, mildly calcified leaflets. There was no stenosis. - Mitral valve: Mildly thickened leaflets . Mild to moderate regurgitation. - Left atrium: The atrium was moderately dilated. - Tricuspid valve: Mild regurgitation. Inadequate TR jet to accurately assess pulmonary pressures. - Pericardium, extracardiac: There was a left pleural effusion.   11/11/13 Lexiscan MPI  Gated imaging reveals an EDV of 223, ESV of 153, TID ratio 0.99, and LVEF of 31% with global hypokinesis, most prominent in the apex and inferolateral walls.  IMPRESSION: Intermediate risk Lexiscan Cardiolite. No diagnostic ST segment changes were noted to indicate ischemia, frequent PVCs were seen without sustained arrhythmia.  Perfusion imaging is most consistent with scar affecting the inferior wall and inferolateral wall as outlined, no large ischemic zones however. LVEF is calculated at 31% with global hypokinesis that is most prominent at the apex and inferior wall. EDV indicates moderate to severe chamber dilatation. Findings are consistent with probable ischemic cardiomyopathy.  08/2014 Echo Study Conclusions  - Left ventricle: The cavity size was mildly dilated. Wall thickness was increased in a pattern of moderate LVH. Systolic function was moderately reduced. The estimated ejection fraction was in the range of 35% to 40%. There is hypokinesis to akinesis of the basal-mid inferolateral and inferior myocardium. Doppler parameters are consistent with abnormal left ventricular relaxation (grade 1 diastolic dysfunction). - Aortic valve: Mildly to moderately calcified annulus. Trileaflet. - Mitral valve: Mildly calcified annulus. There was mild regurgitation. - Left atrium: The atrium was mildly to moderately dilated. - Right atrium: Central venous pressure (est): 3 mm Hg. - Atrial septum: Possible PFO or secundum ASD with apparent left to right flow noted by color Doppler. Cannot exclude vigorous venous return adjacent to septum. Agitated saline study could be considered for further evaluation. - Tricuspid valve: There was trivial regurgitation. - Pulmonary arteries: PA peak pressure: 17 mm Hg (S). - Pericardium, extracardiac: There was no pericardial effusion.  Recommendations: Moderate LVH with mild LV chamber dilatation and LVEF approximately 35-40%. Hypokinesis to akinesis of the mid to basal inferolateral/inferior walls noted suggestive of ischemic cardiomyopathy. Grade 1 diastolic dysfunction. Compared to prior study in April 2015, wall motion has improved somewhat and ejection fraction looks to be higher, although graded in similar range last time. Mild to moderate left  atrial enlargement. Mild MAC with mild mitral regurgitation. Possible PFO or secundum ASD with apparent left to right flow noted by color Doppler. Cannot exclude vigorous venous return adjacent to septum. Agitated saline study could be considered for further evaluation. Trivial tricuspid regurgitation with PASP normal range.     Assessment and Plan   1.Chronic Systolic/diastolic heart failure - Echo 07/6107. LVEF 40-45%, Lexiscan suggests this is an ischemic cardiomyopathy with evidence of prior scar, no active ischemia. He has not been in favor of invasive testing. Given his mild dysfunction will optimize medical therapy, if persistent or progression in dysfunction recondsider invasive testing at that time.  - no current symptoms. Further medication titration limited due to severe dizziness       Antoine Poche, M.D.

## 2014-11-30 ENCOUNTER — Ambulatory Visit (INDEPENDENT_AMBULATORY_CARE_PROVIDER_SITE_OTHER): Payer: Medicare Other | Admitting: Cardiology

## 2014-11-30 ENCOUNTER — Encounter: Payer: Self-pay | Admitting: Cardiology

## 2014-11-30 VITALS — BP 164/95 | HR 69 | Ht 68.0 in | Wt 192.1 lb

## 2014-11-30 DIAGNOSIS — I5042 Chronic combined systolic (congestive) and diastolic (congestive) heart failure: Secondary | ICD-10-CM

## 2014-11-30 DIAGNOSIS — I10 Essential (primary) hypertension: Secondary | ICD-10-CM

## 2014-11-30 MED ORDER — CARVEDILOL 12.5 MG PO TABS: 18.7500 mg | ORAL_TABLET | Freq: Two times a day (BID) | ORAL | Status: DC

## 2014-11-30 NOTE — Progress Notes (Signed)
Clinical Summary [name] is a 69 y.o.male seen today for follow up of the folliowing medical problems.   1. Chronic Systolic/Diastolic heart failure  - 10/2013 echo which showed LVEF 40-45%, mid-inferolateral wall hypokinesis, and grade II diastolic dysfunction.  - repeat echo 08/2014 LVEF 35-40%, basal to mid inferolateral and inferior walls, grade I diastolic dysfunction - completed lexiscan which showed moderate sized moderate to severe intensity inferolateral wall defect and small apical to mid inferior wall defect. Both areas were fixed and consistent with scar.  - denies any SOB/DOE. No chest pain - compliant with medicines. Denies any lightheadness, no dizziness.  2. HL - compliant with statin    Past Medical History  Diagnosis Date  . Hyperlipidemia   . Combined systolic and diastolic heart failure      Allergies  Allergen Reactions  . Neosporin [Neomycin-Bacitracin Zn-Polymyx] Rash  . Voltaren [Diclofenac Sodium] Other (See Comments)    GI upset     Current Outpatient Prescriptions  Medication Sig Dispense Refill  . aspirin 81 MG chewable tablet Chew 81 mg by mouth daily.    . carvedilol (COREG) 12.5 MG tablet Take 1 tablet (12.5 mg total) by mouth 2 (two) times daily. 60 tablet 6  . Clobetasol Prop Emollient Base (CLOBETASOL PROPIONATE E) 0.05 % emollient cream Apply 1 Tube topically 2 (two) times daily. 60 g 3  . doxazosin (CARDURA) 4 MG tablet Take 1 tablet (4 mg total) by mouth daily. 30 tablet 11  . finasteride (PROSCAR) 5 MG tablet Take 1 tablet by mouth daily.    . fluticasone (FLONASE) 50 MCG/ACT nasal spray Place 2 sprays into both nostrils daily. 16 g 2  . gabapentin (NEURONTIN) 300 MG capsule TAKE ONE CAPSULE BY MOUTH TWICE DAILY 60 capsule 2  . hydroquinone 4 % cream Apply 1 application topically 2 (two) times daily.    Marland Kitchen lisinopril (PRINIVIL,ZESTRIL) 2.5 MG tablet TAKE ONE TABLET BY MOUTH ONE TIME DAILY 30 tablet 5  . meloxicam (MOBIC) 15 MG  tablet TAKE 1 TABLET (15 MG TOTAL) BY MOUTH DAILY. 30 tablet 2  . pravastatin (PRAVACHOL) 40 MG tablet TAKE ONE TABLET BY MOUTH ONE TIME DAILY 30 tablet 5  . sertraline (ZOLOFT) 50 MG tablet TAKE ONE TABLET BY MOUTH ONE TIME DAILY 30 tablet 2  . tamsulosin (FLOMAX) 0.4 MG CAPS capsule Take 2 capsules (0.8 mg total) by mouth daily. 60 capsule 11  . traMADol (ULTRAM) 50 MG tablet Take 1 tablet (50 mg total) by mouth 2 (two) times daily. 60 tablet 5  . WELCHOL 625 MG tablet TAKE 2 TABLETS BY MOUTH TWICE A DAY WITH A MEAL 120 tablet 4   No current facility-administered medications for this visit.     Past Surgical History  Procedure Laterality Date  . Tonsillectomy       Allergies  Allergen Reactions  . Neosporin [Neomycin-Bacitracin Zn-Polymyx] Rash  . Voltaren [Diclofenac Sodium] Other (See Comments)    GI upset      Family History  Problem Relation Age of Onset  . Heart disease Mother   . Diabetes Mother   . COPD Father      Social History [name] reports that he has never smoked. He has never used smokeless tobacco. [name] reports that he does not drink alcohol.   Review of Systems CONSTITUTIONAL: No weight loss, fever, chills, weakness or fatigue.  HEENT: Eyes: No visual loss, blurred vision, double vision or yellow sclerae.No hearing loss, sneezing, congestion,  runny nose or sore throat.  SKIN: No rash or itching.  CARDIOVASCULAR: per HPI RESPIRATORY: No shortness of breath, cough or sputum.  GASTROINTESTINAL: No anorexia, nausea, vomiting or diarrhea. No abdominal pain or blood.  GENITOURINARY: No burning on urination, no polyuria NEUROLOGICAL: No headache, dizziness, syncope, paralysis, ataxia, numbness or tingling in the extremities. No change in bowel or bladder control.  MUSCULOSKELETAL: No muscle, back pain, joint pain or stiffness.  LYMPHATICS: No enlarged nodes. No history of splenectomy.  PSYCHIATRIC: No history of depression or anxiety.    ENDOCRINOLOGIC: No reports of sweating, cold or heat intolerance. No polyuria or polydipsia.  Marland Kitchen   Physical Examination p 69 bp 164/95 Wt 192 lbs BMI 29 Gen: resting comfortably, no acute distress HEENT: no scleral icterus, pupils equal round and reactive, no palptable cervical adenopathy,  CV: RRR, no m/r/g, no JVD, no carotid bruits Resp: Clear to auscultation bilaterally GI: abdomen is soft, non-tender, non-distended, normal bowel sounds, no hepatosplenomegaly MSK: extremities are warm, no edema.  Skin: warm, no rash Neuro:  no focal deficits Psych: appropriate affect   Diagnostic Studies 10/2013 Echo  Left ventricle: The cavity size was mildly dilated. Wall thickness was normal. Systolic function was mildly to moderately reduced. The estimated ejection fraction is 40%. Features are consistent with a pseudonormal left ventricular filling pattern, with concomitant abnormal relaxation and increased filling pressure (grade 2 diastolic dysfunction). Doppler parameters are consistent with high ventricular filling pressure. - Regional wall motion abnormality: Mild to moderate hypokinesis of the mid inferolateral myocardium. The remaining walls are mildly hypokinetic. - Aortic valve: Trileaflet; mildly thickened, mildly calcified leaflets. There was no stenosis. - Mitral valve: Mildly thickened leaflets . Mild to moderate regurgitation. - Left atrium: The atrium was moderately dilated. - Tricuspid valve: Mild regurgitation. Inadequate TR jet to accurately assess pulmonary pressures. - Pericardium, extracardiac: There was a left pleural effusion.   11/11/13 Lexiscan MPI  Gated imaging reveals an EDV of 223, ESV of 153, TID ratio 0.99, and LVEF of 31% with global hypokinesis, most prominent in the apex and inferolateral walls.  IMPRESSION: Intermediate risk Lexiscan Cardiolite. No diagnostic ST segment changes were noted to indicate ischemia, frequent PVCs were  seen without sustained arrhythmia. Perfusion imaging is most consistent with scar affecting the inferior wall and inferolateral wall as outlined, no large ischemic zones however. LVEF is calculated at 31% with global hypokinesis that is most prominent at the apex and inferior wall. EDV indicates moderate to severe chamber dilatation. Findings are consistent with probable ischemic cardiomyopathy.   08/2014 echo Study Conclusions  - Left ventricle: The cavity size was mildly dilated. Wall thickness was increased in a pattern of moderate LVH. Systolic function was moderately reduced. The estimated ejection fraction was in the range of 35% to 40%. There is hypokinesis to akinesis of the basal-mid inferolateral and inferior myocardium. Doppler parameters are consistent with abnormal left ventricular relaxation (grade 1 diastolic dysfunction). - Aortic valve: Mildly to moderately calcified annulus. Trileaflet. - Mitral valve: Mildly calcified annulus. There was mild regurgitation. - Left atrium: The atrium was mildly to moderately dilated. - Right atrium: Central venous pressure (est): 3 mm Hg. - Atrial septum: Possible PFO or secundum ASD with apparent left to right flow noted by color Doppler. Cannot exclude vigorous venous return adjacent to septum. Agitated saline study could be considered for further evaluation. - Tricuspid valve: There was trivial regurgitation. - Pulmonary arteries: PA peak pressure: 17 mm Hg (S). - Pericardium, extracardiac: There was no pericardial  effusion.  Recommendations: Moderate LVH with mild LV chamber dilatation and LVEF approximately 35-40%. Hypokinesis to akinesis of the mid to basal inferolateral/inferior walls noted suggestive of ischemic cardiomyopathy. Grade 1 diastolic dysfunction. Compared to prior study in April 2015, wall motion has improved somewhat and ejection fraction looks to be higher, although graded in similar  range last time. Mild to moderate left atrial enlargement. Mild MAC with mild mitral regurgitation. Possible PFO or secundum ASD with apparent left to right flow noted by color Doppler. Cannot exclude vigorous venous return adjacent to septum. Agitated saline study could be considered for further evaluation. Trivial tricuspid regurgitation with PASP normal range.     Assessment and Plan   1.Chronic Systolic/diastolic heart failure - Echo 07/7913. LVEF 40-45%. Repeat echo 08/2014 LVEF 35-40%. Eugenie Birks suggests this is an ischemic cardiomyopathy with evidence of prior scar, no active ischemia. He has not been in favor of invasive testing.  - no current symptoms. Medical therapy has been somewhat limited due to dizziness, we will try to increase coreg to 18.75mg  bid.   2. HTN - elevated today in clinic, will increase coreg  F/u 3 months     Antoine Poche, M.D.

## 2014-11-30 NOTE — Patient Instructions (Signed)
Your physician recommends that you schedule a follow-up appointment in: 3 months with Dr. Wyline Mood  Your physician has recommended you make the following change in your medication:   INCREASE COREG 18.75 MG TWICE DAILY  CONTINUE ALL OTHER MEDICATIONS AS DIRECTED  Your physician has requested that you regularly monitor and record your blood pressure readings at home FOR 1 WEEK PRIOR TO YOUR NEXT OFFICE VISIT. Please use the same machine at the same time of day to check your readings and record them to bring to your follow-up visit.  Thank you for choosing [facility]!!

## 2014-12-13 ENCOUNTER — Telehealth: Payer: Self-pay | Admitting: Family Medicine

## 2014-12-13 NOTE — Telephone Encounter (Signed)
Confirmed refills with [facility], pt aware to pickup rx there

## 2015-01-19 ENCOUNTER — Other Ambulatory Visit: Payer: Self-pay | Admitting: Family Medicine

## 2015-02-16 ENCOUNTER — Other Ambulatory Visit: Payer: Self-pay | Admitting: Family Medicine

## 2015-02-17 ENCOUNTER — Other Ambulatory Visit: Payer: Self-pay | Admitting: Family Medicine

## 2015-02-22 ENCOUNTER — Other Ambulatory Visit: Payer: Self-pay | Admitting: Family Medicine

## 2015-03-03 ENCOUNTER — Ambulatory Visit (INDEPENDENT_AMBULATORY_CARE_PROVIDER_SITE_OTHER): Payer: Medicare Other | Admitting: Cardiology

## 2015-03-03 ENCOUNTER — Encounter: Payer: Self-pay | Admitting: Cardiology

## 2015-03-03 VITALS — BP 168/96 | HR 60 | Ht 68.0 in | Wt 193.0 lb

## 2015-03-03 DIAGNOSIS — I10 Essential (primary) hypertension: Secondary | ICD-10-CM

## 2015-03-03 DIAGNOSIS — I5022 Chronic systolic (congestive) heart failure: Secondary | ICD-10-CM | POA: Diagnosis not present

## 2015-03-03 NOTE — Progress Notes (Signed)
Patient ID: Taylor Cross, male   DOB: 04-25-1946, 69 y.o.   MRN: [id number]     Clinical Summary Taylor Cross is a 69 y.o.male seen today for follow up of the following medical problems.   1. Chronic Systolic/Diastolic heart failure  - 10/2013 echo which showed LVEF 40-45%, mid-inferolateral wall hypokinesis, and grade II diastolic dysfunction.  - repeat echo 08/2014 LVEF 35-40%, basal to mid inferolateral and inferior walls, grade I diastolic dysfunction - completed lexiscan which showed moderate sized moderate to severe intensity inferolateral wall defect and small apical to mid inferior wall defect. Both areas were fixed and consistent with scar.   - last visit increased coreg to 18.75 mg bid. Notes some orthostatic symptoms, but overall tolerable - no SOB, no DOE, no LE edema.  2. HL - compliant with statin  3. HTN - home bp at goal by bp log - compliant with meds   Past Medical History  Diagnosis Date  . Hyperlipidemia   . Combined systolic and diastolic heart failure      Allergies  Allergen Reactions  . Neosporin [Neomycin-Bacitracin Zn-Polymyx] Rash  . Voltaren [Diclofenac Sodium] Other (See Comments)    GI upset     Current Outpatient Prescriptions  Medication Sig Dispense Refill  . aspirin 81 MG chewable tablet Chew 81 mg by mouth daily.    . carvedilol (COREG) 12.5 MG tablet Take 1.5 tablets (18.75 mg total) by mouth 2 (two) times daily. 180 tablet 3  . Clobetasol Prop Emollient Base (CLOBETASOL PROPIONATE E) 0.05 % emollient cream Apply 1 Tube topically 2 (two) times daily. 60 g 3  . doxazosin (CARDURA) 4 MG tablet Take 1 tablet (4 mg total) by mouth daily. 30 tablet 11  . finasteride (PROSCAR) 5 MG tablet Take 1 tablet by mouth daily.    . fluticasone (FLONASE) 50 MCG/ACT nasal spray Place 2 sprays into both nostrils daily. 16 g 2  . gabapentin (NEURONTIN) 300 MG capsule TAKE ONE CAPSULE BY MOUTH TWICE DAILY 60 capsule 0  . hydroquinone 4 % cream Apply 1  application topically 2 (two) times daily.    Marland Kitchen lisinopril (PRINIVIL,ZESTRIL) 2.5 MG tablet TAKE ONE TABLET BY MOUTH ONE TIME DAILY 30 tablet 5  . loratadine (CLARITIN) 10 MG tablet Take 10 mg by mouth at bedtime.    . meloxicam (MOBIC) 15 MG tablet TAKE ONE TABLET BY MOUTH ONE TIME DAILY 30 tablet 1  . pravastatin (PRAVACHOL) 40 MG tablet TAKE ONE TABLET BY MOUTH ONE TIME DAILY 30 tablet 5  . sertraline (ZOLOFT) 50 MG tablet TAKE ONE TABLET BY MOUTH ONE TIME DAILY 30 tablet 1  . tamsulosin (FLOMAX) 0.4 MG CAPS capsule Take 2 capsules (0.8 mg total) by mouth daily. 60 capsule 11  . traMADol (ULTRAM) 50 MG tablet Take 1 tablet (50 mg total) by mouth 2 (two) times daily. 60 tablet 5  . WELCHOL 625 MG tablet TAKE 2 TABLETS BY MOUTH TWICE A DAY WITH A MEAL 120 tablet 4   No current facility-administered medications for this visit.     Past Surgical History  Procedure Laterality Date  . Tonsillectomy       Allergies  Allergen Reactions  . Neosporin [Neomycin-Bacitracin Zn-Polymyx] Rash  . Voltaren [Diclofenac Sodium] Other (See Comments)    GI upset      Family History  Problem Relation Age of Onset  . Heart disease Mother   . Diabetes Mother   . COPD Father      Social  History Taylor Cross reports that he has never smoked. He has never used smokeless tobacco. Taylor Cross reports that he does not drink alcohol.   Review of Systems CONSTITUTIONAL: No weight loss, fever, chills, weakness or fatigue.  HEENT: Eyes: No visual loss, blurred vision, double vision or yellow sclerae.No hearing loss, sneezing, congestion, runny nose or sore throat.  SKIN: No rash or itching.  CARDIOVASCULAR: per HPI RESPIRATORY: No shortness of breath, cough or sputum.  GASTROINTESTINAL: No anorexia, nausea, vomiting or diarrhea. No abdominal pain or blood.  GENITOURINARY: No burning on urination, no polyuria NEUROLOGICAL: No headache, dizziness, syncope, paralysis, ataxia, numbness or tingling in the  extremities. No change in bowel or bladder control.  MUSCULOSKELETAL: No muscle, back pain, joint pain or stiffness.  LYMPHATICS: No enlarged nodes. No history of splenectomy.  PSYCHIATRIC: No history of depression or anxiety.  ENDOCRINOLOGIC: No reports of sweating, cold or heat intolerance. No polyuria or polydipsia.  Marland Kitchen   Physical Examination Filed Vitals:   03/03/15 1503  BP: 168/96  Pulse: 60   Filed Vitals:   03/03/15 1503  Height: 5\' 8"  (1.727 m)  Weight: 193 lb (87.544 kg)    Gen: resting comfortably, no acute distress HEENT: no scleral icterus, pupils equal round and reactive, no palptable cervical adenopathy,  CV: RRR, no m/r/g, no JvD Resp: Clear to auscultation bilaterally GI: abdomen is soft, non-tender, non-distended, normal bowel sounds, no hepatosplenomegaly MSK: extremities are warm, no edema.  Skin: warm, no rash Neuro:  no focal deficits Psych: appropriate affect   Diagnostic Studies 10/2013 Echo  Left ventricle: The cavity size was mildly dilated. Wall thickness was normal. Systolic function was mildly to moderately reduced. The estimated ejection fraction is 40%. Features are consistent with a pseudonormal left ventricular filling pattern, with concomitant abnormal relaxation and increased filling pressure (grade 2 diastolic dysfunction). Doppler parameters are consistent with high ventricular filling pressure. - Regional wall motion abnormality: Mild to moderate hypokinesis of the mid inferolateral myocardium. The remaining walls are mildly hypokinetic. - Aortic valve: Trileaflet; mildly thickened, mildly calcified leaflets. There was no stenosis. - Mitral valve: Mildly thickened leaflets . Mild to moderate regurgitation. - Left atrium: The atrium was moderately dilated. - Tricuspid valve: Mild regurgitation. Inadequate TR jet to accurately assess pulmonary pressures. - Pericardium, extracardiac: There was a left pleural effusion.   11/11/13  Lexiscan MPI  Gated imaging reveals an EDV of 223, ESV of 153, TID ratio 0.99, and LVEF of 31% with global hypokinesis, most prominent in the apex and inferolateral walls.  IMPRESSION: Intermediate risk Lexiscan Cardiolite. No diagnostic ST segment changes were noted to indicate ischemia, frequent PVCs were seen without sustained arrhythmia. Perfusion imaging is most consistent with scar affecting the inferior wall and inferolateral wall as outlined, no large ischemic zones however. LVEF is calculated at 31% with global hypokinesis that is most prominent at the apex and inferior wall. EDV indicates moderate to severe chamber dilatation. Findings are consistent with probable ischemic cardiomyopathy.   08/2014 echo Study Conclusions  - Left ventricle: The cavity size was mildly dilated. Wall thickness was increased in a pattern of moderate LVH. Systolic function was moderately reduced. The estimated ejection fraction was in the range of 35% to 40%. There is hypokinesis to akinesis of the basal-mid inferolateral and inferior myocardium. Doppler parameters are consistent with abnormal left ventricular relaxation (grade 1 diastolic dysfunction). - Aortic valve: Mildly to moderately calcified annulus. Trileaflet. - Mitral valve: Mildly calcified annulus. There was mild regurgitation. - Left  atrium: The atrium was mildly to moderately dilated. - Right atrium: Central venous pressure (est): 3 mm Hg. - Atrial septum: Possible PFO or secundum ASD with apparent left to right flow noted by color Doppler. Cannot exclude vigorous venous return adjacent to septum. Agitated saline study could be considered for further evaluation. - Tricuspid valve: There was trivial regurgitation. - Pulmonary arteries: PA peak pressure: 17 mm Hg (S). - Pericardium, extracardiac: There was no pericardial effusion.  Recommendations: Moderate LVH with mild LV chamber dilatation and LVEF  approximately 35-40%. Hypokinesis to akinesis of the mid to basal inferolateral/inferior walls noted suggestive of ischemic cardiomyopathy. Grade 1 diastolic dysfunction. Compared to prior study in April 2015, wall motion has improved somewhat and ejection fraction looks to be higher, although graded in similar range last time. Mild to moderate left atrial enlargement. Mild MAC with mild mitral regurgitation. Possible PFO or secundum ASD with apparent left to right flow noted by color Doppler. Cannot exclude vigorous venous return adjacent to septum. Agitated saline study could be considered for further evaluation. Trivial tricuspid regurgitation with PASP normal range.        Assessment and Plan   1.Chronic Systolic/diastolic heart failure - Echo 02/4131. LVEF 40-45%. Repeat echo 08/2014 LVEF 35-40%. Eugenie Birks suggests this is an ischemic cardiomyopathy with evidence of prior scar, no active ischemia. He has not been in favor of invasive testing.   - no current symptoms. Medical therapy has been somewhat limited due to dizziness. Continue current meds  2. HTN - - at goal by home numbers, appears to have some white coat HTN. Continue current meds  3. HL - continue current statin  F/u 3 months     Antoine Poche, M.D

## 2015-03-03 NOTE — Patient Instructions (Signed)
Continue all current medications. Your physician wants you to follow up in:  4 months.  You will receive a reminder letter in the mail one-two months in advance.  If you don't receive a letter, please call our office to schedule the follow up appointment   

## 2015-03-16 ENCOUNTER — Other Ambulatory Visit: Payer: Self-pay | Admitting: Family Medicine

## 2015-03-21 ENCOUNTER — Other Ambulatory Visit: Payer: Self-pay | Admitting: Family

## 2015-04-05 ENCOUNTER — Ambulatory Visit (INDEPENDENT_AMBULATORY_CARE_PROVIDER_SITE_OTHER): Payer: Medicare Other | Admitting: Family Medicine

## 2015-04-05 ENCOUNTER — Encounter: Payer: Self-pay | Admitting: Family Medicine

## 2015-04-05 VITALS — BP 145/91 | HR 79 | Temp 97.2°F | Ht 68.0 in | Wt 188.8 lb

## 2015-04-05 DIAGNOSIS — N400 Enlarged prostate without lower urinary tract symptoms: Secondary | ICD-10-CM

## 2015-04-05 DIAGNOSIS — I10 Essential (primary) hypertension: Secondary | ICD-10-CM | POA: Diagnosis not present

## 2015-04-05 DIAGNOSIS — E785 Hyperlipidemia, unspecified: Secondary | ICD-10-CM

## 2015-04-05 MED ORDER — TRAMADOL HCL 50 MG PO TABS: 50.0000 mg | ORAL_TABLET | Freq: Two times a day (BID) | ORAL | Status: DC

## 2015-04-05 NOTE — Progress Notes (Signed)
Subjective:  Patient ID: Taylor Cross, male    DOB: [birth year]  Age: 69 y.o. MRN: [id number]  CC: Hyperlipidemia   HPI Taylor Cross presents for follow-up of elevated cholesterol. Doing well without complaints on current medication. Denies side effects of statin including myalgia and arthralgia and nausea. Also in today for liver function testing. Currently no chest pain, shortness of breath or other cardiovascular related symptoms noted.   follow-up of hypertension. Patient has no history of headache chest pain or shortness of breath or recent cough. Patient also denies symptoms of TIA such as numbness weakness lateralizing. Patient checks  blood pressure at home since he gets anxious when he comes here. Readings at home run about 120/70. Also at the drugstore it'll run about that. He checks it just about every day. Patient denies side effects from his medication. States taking it regularly.  Sees Dr. Redmond Pulling of urology says he is emptying bladder well.   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  History Taylor Cross has a past medical history of Hyperlipidemia and Combined systolic and diastolic heart failure.   He has past surgical history that includes Tonsillectomy.   His family history includes COPD in his father; Diabetes in his mother; Heart disease in his mother.He reports that he has never smoked. He has never used smokeless tobacco. He reports that he does not drink alcohol or use illicit drugs.  Current Outpatient Prescriptions on File Prior to Visit  Medication Sig Dispense Refill  . aspirin 81 MG chewable tablet Chew 81 mg by mouth daily.    . carvedilol (COREG) 12.5 MG tablet Take 1.5 tablets (18.75 mg total) by mouth 2 (two) times daily. 180 tablet 3  . Clobetasol Prop Emollient Base (CLOBETASOL PROPIONATE E) 0.05 % emollient cream Apply 1 Tube topically 2 (two) times daily. 60 g 3  . doxazosin (CARDURA) 4 MG tablet Take 1 tablet (4 mg total) by mouth daily. 30 tablet  11  . finasteride (PROSCAR) 5 MG tablet Take 1 tablet by mouth daily.    . fluticasone (FLONASE) 50 MCG/ACT nasal spray USE TWO SPRAYS EACH NOSTRIL ONCE DAILY. 16 g 1  . gabapentin (NEURONTIN) 300 MG capsule TAKE ONE CAPSULE BY MOUTH TWICE DAILY 60 capsule 0  . hydroquinone 4 % cream Apply 1 application topically 2 (two) times daily.    Marland Kitchen lisinopril (PRINIVIL,ZESTRIL) 2.5 MG tablet TAKE ONE TABLET BY MOUTH ONE TIME DAILY 30 tablet 5  . loratadine (CLARITIN) 10 MG tablet Take 10 mg by mouth at bedtime.    . meloxicam (MOBIC) 15 MG tablet TAKE ONE TABLET BY MOUTH ONE TIME DAILY 30 tablet 1  . pravastatin (PRAVACHOL) 40 MG tablet TAKE ONE TABLET BY MOUTH ONE TIME DAILY 30 tablet 5  . sertraline (ZOLOFT) 50 MG tablet TAKE ONE TABLET BY MOUTH ONE TIME DAILY 30 tablet 1  . tamsulosin (FLOMAX) 0.4 MG CAPS capsule Take 2 capsules (0.8 mg total) by mouth daily. 60 capsule 11  . WELCHOL 625 MG tablet TAKE 2 TABLETS BY MOUTH TWICE A DAY WITH A MEAL 120 tablet 0   No current facility-administered medications on file prior to visit.    ROS Review of Systems  Constitutional: Negative for fever, chills and diaphoresis.  HENT: Negative for congestion, rhinorrhea and sore throat.   Respiratory: Negative for cough, shortness of breath and wheezing.   Cardiovascular: Negative for chest pain.  Gastrointestinal: Negative for nausea, vomiting, abdominal pain, diarrhea, constipation and abdominal distention.  Genitourinary:  Negative for dysuria and frequency.  Musculoskeletal: Negative for joint swelling and arthralgias.  Skin: Negative for rash.  Neurological: Negative for headaches.    Objective:  BP 145/91 mmHg  Pulse 79  Temp(Src) 97.2 F (36.2 C) (Oral)  Ht _0  (1.727 m)  Wt 188 lb 12.8 oz (85.639 kg)  BMI 28.71 kg/m2  BP Readings from Last 3 Encounters:  04/05/15 145/91  03/03/15 168/96  11/30/14 164/95    Wt Readings from Last 3 Encounters:  04/05/15 188 lb 12.8 oz (85.639 kg)    03/03/15 193 lb (87.544 kg)  11/30/14 192 lb 1.9 oz (87.145 kg)     Physical Exam  Constitutional: He is oriented to person, place, and time. He appears well-developed and well-nourished. No distress.  HENT:  Head: Normocephalic and atraumatic.  Right Ear: External ear normal.  Left Ear: External ear normal.  Nose: Nose normal.  Mouth/Throat: Oropharynx is clear and moist.  Eyes: Conjunctivae and EOM are normal. Pupils are equal, round, and reactive to light.  Neck: Normal range of motion. Neck supple. No thyromegaly present.  Cardiovascular: Normal rate, regular rhythm and normal heart sounds.   No murmur heard. Pulmonary/Chest: Effort normal and breath sounds normal. No respiratory distress. He has no wheezes. He has no rales.  Abdominal: Soft. Bowel sounds are normal. He exhibits no distension. There is no tenderness.  Lymphadenopathy:    He has no cervical adenopathy.  Neurological: He is alert and oriented to person, place, and time. He has normal reflexes.  Skin: Skin is warm and dry.  Psychiatric: He has a normal mood and affect. His behavior is normal. Judgment and thought content normal.    No results found for: HGBA1C  Lab Results  Component Value Date   WBC 7.3 10/06/2013   HGB 13.0* 10/06/2013   HCT 42.1* 10/06/2013   GLUCOSE 96 09/30/2014   CHOL 159 09/30/2014   TRIG 143 09/30/2014   HDL 49 09/30/2014   LDLCALC 74 10/06/2013   ALT 14 09/30/2014   AST 16 09/30/2014   NA 141 09/30/2014   K 4.1 09/30/2014   CL 102 09/30/2014   CREATININE 1.02 09/30/2014   BUN 22 09/30/2014   CO2 25 09/30/2014   TSH 1.910 10/06/2013    No results found.  Assessment & Plan:   Taylor Cross was seen today for hyperlipidemia.  Diagnoses and all orders for this visit:  Essential hypertension -     PSA, total and free -     CMP14+EGFR -     Lipid panel  BPH (benign prostatic hyperplasia) -     PSA, total and free -     CMP14+EGFR -     Lipid panel  Hyperlipemia -      PSA, total and free -     CMP14+EGFR -     Lipid panel  Other orders -     traMADol (ULTRAM) 50 MG tablet; Take 1 tablet (50 mg total) by mouth 2 (two) times daily.  I am having Mr. Losada maintain his hydroquinone, aspirin, doxazosin, tamsulosin, Clobetasol Prop Emollient Base, finasteride, pravastatin, lisinopril, loratadine, carvedilol, sertraline, meloxicam, WELCHOL, gabapentin, fluticasone, and traMADol.  Meds ordered this encounter  Medications  . traMADol (ULTRAM) 50 MG tablet    Sig: Take 1 tablet (50 mg total) by mouth 2 (two) times daily.    Dispense:  60 tablet    Refill:  5     Follow-up: No Follow-up on file.  Claretta Fraise, M.D.

## 2015-04-05 NOTE — Addendum Note (Signed)
Addended by: [name] on: 04/05/2015 12:18 PM   Modules accepted: Kipp Brood

## 2015-04-06 ENCOUNTER — Encounter: Payer: Self-pay | Admitting: *Deleted

## 2015-04-06 LAB — PSA, TOTAL AND FREE
PSA, Free Pct: 28.9 %
PSA, Free: 0.26 ng/mL
Prostate Specific Ag, Serum: 0.9 ng/mL (ref 0.0–4.0)

## 2015-04-06 LAB — LIPID PANEL
CHOL/HDL RATIO: 2.7 ratio (ref 0.0–5.0)
Cholesterol, Total: 137 mg/dL (ref 100–199)
HDL: 50 mg/dL (ref >39)
LDL CALC: 63 mg/dL (ref 0–99)
TRIGLYCERIDES: 118 mg/dL (ref 0–149)
VLDL Cholesterol Cal: 24 mg/dL (ref 5–40)

## 2015-04-06 LAB — CMP14+EGFR
A/G RATIO: 1.8 (ref 1.1–2.5)
ALBUMIN: 4.3 g/dL (ref 3.6–4.8)
ALT: 11 IU/L (ref 0–44)
AST: 10 IU/L (ref 0–40)
Alkaline Phosphatase: 60 IU/L (ref 39–117)
BUN / CREAT RATIO: 18 (ref 10–22)
BUN: 22 mg/dL (ref 8–27)
Bilirubin Total: 0.6 mg/dL (ref 0.0–1.2)
CALCIUM: 9.1 mg/dL (ref 8.6–10.2)
CO2: 22 mmol/L (ref 18–29)
Chloride: 100 mmol/L (ref 97–108)
Creatinine, Ser: 1.19 mg/dL (ref 0.76–1.27)
GFR calc Af Amer: 72 mL/min/{1.73_m2} (ref >59)
GFR, EST NON AFRICAN AMERICAN: 62 mL/min/{1.73_m2} (ref >59)
GLOBULIN, TOTAL: 2.4 g/dL (ref 1.5–4.5)
Glucose: 91 mg/dL (ref 65–99)
Potassium: 4.9 mmol/L (ref 3.5–5.2)
SODIUM: 140 mmol/L (ref 134–144)
TOTAL PROTEIN: 6.7 g/dL (ref 6.0–8.5)

## 2015-04-14 ENCOUNTER — Other Ambulatory Visit: Payer: Self-pay | Admitting: Family Medicine

## 2015-04-23 ENCOUNTER — Other Ambulatory Visit: Payer: Self-pay | Admitting: Family Medicine

## 2015-04-23 ENCOUNTER — Other Ambulatory Visit: Payer: Self-pay | Admitting: Cardiology

## 2015-06-20 ENCOUNTER — Other Ambulatory Visit: Payer: Self-pay | Admitting: Family Medicine

## 2015-06-20 ENCOUNTER — Other Ambulatory Visit: Payer: Self-pay | Admitting: Family

## 2015-07-06 ENCOUNTER — Encounter: Payer: Self-pay | Admitting: Cardiology

## 2015-07-06 ENCOUNTER — Ambulatory Visit (INDEPENDENT_AMBULATORY_CARE_PROVIDER_SITE_OTHER): Payer: Medicare Other | Admitting: Cardiology

## 2015-07-06 VITALS — BP 169/89 | HR 63 | Ht 68.0 in | Wt 192.4 lb

## 2015-07-06 DIAGNOSIS — I493 Ventricular premature depolarization: Secondary | ICD-10-CM | POA: Diagnosis not present

## 2015-07-06 DIAGNOSIS — E785 Hyperlipidemia, unspecified: Secondary | ICD-10-CM | POA: Diagnosis not present

## 2015-07-06 DIAGNOSIS — I10 Essential (primary) hypertension: Secondary | ICD-10-CM | POA: Diagnosis not present

## 2015-07-06 DIAGNOSIS — I5022 Chronic systolic (congestive) heart failure: Secondary | ICD-10-CM | POA: Diagnosis not present

## 2015-07-06 NOTE — Patient Instructions (Signed)

## 2015-07-06 NOTE — Progress Notes (Addendum)
Patient ID: Taylor Cross, male   DOB: [birth year], 69 y.o.   MRN: [id number]     Clinical Summary Mr. Livsey is a 69 y.o.male seen today for follow up of the following medical problems.   1. Chronic Systolic/Diastolic heart failure  - 10/2013 echo which showed LVEF 40-45%, mid-inferolateral wall hypokinesis, and grade II diastolic dysfunction.  - repeat echo 08/2014 LVEF 35-40%, basal to mid inferolateral and inferior walls, grade I diastolic dysfunction - completed lexiscan which showed moderate sized moderate to severe intensity inferolateral wall defect and small apical to mid inferior wall defect. Both areas were fixed and consistent with scar.   - no SOB, no DOE, no LE edema. - compliant with meds. Limiting sodium intake. Weights stable low 190s   2. HL - compliant with statin - normal panel 03/2015  3. HTN - compliant with meds  Past Medical History  Diagnosis Date  . Hyperlipidemia   . Combined systolic and diastolic heart failure      Allergies  Allergen Reactions  . Neosporin [Neomycin-Bacitracin Zn-Polymyx] Rash  . Voltaren [Diclofenac Sodium] Other (See Comments)    GI upset     Current Outpatient Prescriptions  Medication Sig Dispense Refill  . aspirin 81 MG chewable tablet Chew 81 mg by mouth daily.    . carvedilol (COREG) 12.5 MG tablet Take 1.5 tablets (18.75 mg total) by mouth 2 (two) times daily. 180 tablet 3  . Clobetasol Prop Emollient Base (CLOBETASOL PROPIONATE E) 0.05 % emollient cream Apply 1 Tube topically 2 (two) times daily. 60 g 3  . doxazosin (CARDURA) 4 MG tablet Take 1 tablet (4 mg total) by mouth daily. 30 tablet 11  . finasteride (PROSCAR) 5 MG tablet Take 1 tablet by mouth daily.    . fluticasone (FLONASE) 50 MCG/ACT nasal spray USE TWO SPRAYS EACH NOSTRIL ONCE DAILY. 16 g 3  . gabapentin (NEURONTIN) 300 MG capsule TAKE ONE CAPSULE BY MOUTH TWICE DAILY 60 capsule 2  . hydroquinone 4 % cream Apply 1 application topically 2 (two) times  daily.    Marland Kitchen lisinopril (PRINIVIL,ZESTRIL) 2.5 MG tablet TAKE ONE TABLET BY MOUTH ONE TIME DAILY 30 tablet 4  . loratadine (CLARITIN) 10 MG tablet Take 10 mg by mouth at bedtime.    . meloxicam (MOBIC) 15 MG tablet TAKE ONE TABLET BY MOUTH ONE TIME DAILY 30 tablet 2  . pravastatin (PRAVACHOL) 40 MG tablet TAKE ONE TABLET BY MOUTH ONE TIME DAILY 30 tablet 4  . sertraline (ZOLOFT) 50 MG tablet TAKE ONE TABLET BY MOUTH ONE TIME DAILY 30 tablet 2  . tamsulosin (FLOMAX) 0.4 MG CAPS capsule Take 2 capsules (0.8 mg total) by mouth daily. 60 capsule 11  . traMADol (ULTRAM) 50 MG tablet Take 1 tablet (50 mg total) by mouth 2 (two) times daily. 60 tablet 5  . WELCHOL 625 MG tablet TAKE 2 TABLETS BY MOUTH TWICE A DAY WITH A MEAL 120 tablet 5   No current facility-administered medications for this visit.     Past Surgical History  Procedure Laterality Date  . Tonsillectomy       Allergies  Allergen Reactions  . Neosporin [Neomycin-Bacitracin Zn-Polymyx] Rash  . Voltaren [Diclofenac Sodium] Other (See Comments)    GI upset      Family History  Problem Relation Age of Onset  . Heart disease Mother   . Diabetes Mother   . COPD Father      Social History Mr. Barz reports that he has never smoked.  He has never used smokeless tobacco. Mr. Cass reports that he does not drink alcohol.   Review of Systems CONSTITUTIONAL: No weight loss, fever, chills, weakness or fatigue.  HEENT: Eyes: No visual loss, blurred vision, double vision or yellow sclerae.No hearing loss, sneezing, congestion, runny nose or sore throat.  SKIN: No rash or itching.  CARDIOVASCULAR: per hpi RESPIRATORY: No shortness of breath, cough or sputum.  GASTROINTESTINAL: No anorexia, nausea, vomiting or diarrhea. No abdominal pain or blood.  GENITOURINARY: No burning on urination, no polyuria NEUROLOGICAL: No headache, dizziness, syncope, paralysis, ataxia, numbness or tingling in the extremities. No change in bowel or  bladder control.  MUSCULOSKELETAL: No muscle, back pain, joint pain or stiffness.  LYMPHATICS: No enlarged nodes. No history of splenectomy.  PSYCHIATRIC: No history of depression or anxiety.  ENDOCRINOLOGIC: No reports of sweating, cold or heat intolerance. No polyuria or polydipsia.  Marland Kitchen   Physical Examination Filed Vitals:   07/06/15 1423  BP: 169/89  Pulse: 63   Filed Vitals:   07/06/15 1423  Height:  (1.727 m)  Weight: 192 lb 6.4 oz (87.272 kg)   Manual bp: 130/80  Gen: resting comfortably, no acute distress HEENT: no scleral icterus, pupils equal round and reactive, no palptable cervical adenopathy,  CV: RRR, no m/r/g, nojvd Resp: Clear to auscultation bilaterally GI: abdomen is soft, non-tender, non-distended, normal bowel sounds, no hepatosplenomegaly MSK: extremities are warm, no edema.  Skin: warm, no rash Neuro:  no focal deficits Psych: appropriate affect   Diagnostic Studies  10/2013 Echo  Left ventricle: The cavity size was mildly dilated. Wall thickness was normal. Systolic function was mildly to moderately reduced. The estimated ejection fraction is 40%. Features are consistent with a pseudonormal left ventricular filling pattern, with concomitant abnormal relaxation and increased filling pressure (grade 2 diastolic dysfunction). Doppler parameters are consistent with high ventricular filling pressure. - Regional wall motion abnormality: Mild to moderate hypokinesis of the mid inferolateral myocardium. The remaining walls are mildly hypokinetic. - Aortic valve: Trileaflet; mildly thickened, mildly calcified leaflets. There was no stenosis. - Mitral valve: Mildly thickened leaflets . Mild to moderate regurgitation. - Left atrium: The atrium was moderately dilated. - Tricuspid valve: Mild regurgitation. Inadequate TR jet to accurately assess pulmonary pressures. - Pericardium, extracardiac: There was a left pleural effusion.   11/11/13 Lexiscan  MPI  Gated imaging reveals an EDV of 223, ESV of 153, TID ratio 0.99, and LVEF of 31% with global hypokinesis, most prominent in the apex and inferolateral walls.  IMPRESSION: Intermediate risk Lexiscan Cardiolite. No diagnostic ST segment changes were noted to indicate ischemia, frequent PVCs were seen without sustained arrhythmia. Perfusion imaging is most consistent with scar affecting the inferior wall and inferolateral wall as outlined, no large ischemic zones however. LVEF is calculated at 31% with global hypokinesis that is most prominent at the apex and inferior wall. EDV indicates moderate to severe chamber dilatation. Findings are consistent with probable ischemic cardiomyopathy.   08/2014 echo Study Conclusions  - Left ventricle: The cavity size was mildly dilated. Wall thickness was increased in a pattern of moderate LVH. Systolic function was moderately reduced. The estimated ejection fraction was in the range of 35% to 40%. There is hypokinesis to akinesis of the basal-mid inferolateral and inferior myocardium. Doppler parameters are consistent with abnormal left ventricular relaxation (grade 1 diastolic dysfunction). - Aortic valve: Mildly to moderately calcified annulus. Trileaflet. - Mitral valve: Mildly calcified annulus. There was mild regurgitation. - Left atrium: The atrium was  mildly to moderately dilated. - Right atrium: Central venous pressure (est): 3 mm Hg. - Atrial septum: Possible PFO or secundum ASD with apparent left to right flow noted by color Doppler. Cannot exclude vigorous venous return adjacent to septum. Agitated saline study could be considered for further evaluation. - Tricuspid valve: There was trivial regurgitation. - Pulmonary arteries: PA peak pressure: 17 mm Hg (S). - Pericardium, extracardiac: There was no pericardial effusion.  Recommendations: Moderate LVH with mild LV chamber dilatation and LVEF approximately  35-40%. Hypokinesis to akinesis of the mid to basal inferolateral/inferior walls noted suggestive of ischemic cardiomyopathy. Grade 1 diastolic dysfunction. Compared to prior study in April 2015, wall motion has improved somewhat and ejection fraction looks to be higher, although graded in similar range last time. Mild to moderate left atrial enlargement. Mild MAC with mild mitral regurgitation. Possible PFO or secundum ASD with apparent left to right flow noted by color Doppler. Cannot exclude vigorous venous return adjacent to septum. Agitated saline study could be considered for further evaluation. Trivial tricuspid regurgitation with PASP normal range.         Assessment and Plan  1.Chronic Systolic/diastolic heart failure - Echo 0/2725. LVEF 40-45%. Repeat echo 08/2014 LVEF 35-40%. Eugenie Birks suggests this is an ischemic cardiomyopathy with evidence of prior scar, no active ischemia. He has not been in favor of invasive testing.   - no current symptoms. Medical therapy has been limited due to orthostatic dizziness. Continue current meds  2. HTN - - at goal. Continue current meds  3. HL - continue current statin  F/u 6 months      Antoine Poche, M.D.

## 2015-07-10 ENCOUNTER — Other Ambulatory Visit: Payer: Self-pay | Admitting: Family Medicine

## 2015-07-16 ENCOUNTER — Other Ambulatory Visit: Payer: Self-pay | Admitting: Family Medicine

## 2015-07-18 ENCOUNTER — Other Ambulatory Visit: Payer: Self-pay | Admitting: *Deleted

## 2015-07-18 ENCOUNTER — Other Ambulatory Visit: Payer: Self-pay | Admitting: Family Medicine

## 2015-07-18 DIAGNOSIS — L309 Dermatitis, unspecified: Secondary | ICD-10-CM

## 2015-07-18 MED ORDER — CLOBETASOL PROP EMOLLIENT BASE 0.05 % EX CREA
1.0000 | TOPICAL_CREAM | Freq: Two times a day (BID) | CUTANEOUS | Status: DC
Start: 2015-07-18 — End: 2020-04-21

## 2015-07-18 MED ORDER — HYDROQUINONE 4 % EX CREA: 1.0000 "application " | TOPICAL_CREAM | Freq: Two times a day (BID) | CUTANEOUS | Status: DC

## 2015-07-18 NOTE — Telephone Encounter (Signed)
done

## 2015-07-20 ENCOUNTER — Other Ambulatory Visit: Payer: Self-pay | Admitting: Cardiology

## 2015-07-21 ENCOUNTER — Other Ambulatory Visit: Payer: Self-pay | Admitting: Cardiology

## 2015-08-04 ENCOUNTER — Encounter: Payer: Self-pay | Admitting: Family Medicine

## 2015-08-04 ENCOUNTER — Ambulatory Visit (INDEPENDENT_AMBULATORY_CARE_PROVIDER_SITE_OTHER): Payer: Medicare Other | Admitting: Family Medicine

## 2015-08-04 VITALS — BP 185/91 | HR 65 | Temp 96.6°F | Ht 68.0 in | Wt 193.0 lb

## 2015-08-04 DIAGNOSIS — K4090 Unilateral inguinal hernia, without obstruction or gangrene, not specified as recurrent: Secondary | ICD-10-CM

## 2015-08-04 DIAGNOSIS — I10 Essential (primary) hypertension: Secondary | ICD-10-CM | POA: Diagnosis not present

## 2015-08-04 NOTE — Progress Notes (Signed)
Subjective:    Patient ID: Taylor Cross, male    DOB: Jun 20, 1946, 70 y.o.   MRN: [id number]  HPI 70 year old gentleman here with question of possible hernia. He has noted some swelling in the left groin area for the past several weeks. There is been some pain. Symptoms are not associated with lifting or straining but he has felt more discomfort when he coughs or sneezes. He has seen other providers in this practice as well as cardiology and urology. He has been followed for some time regarding his blood pressure. He is on a very low dose of ACE inhibitor as well as carvedilol. I am unsure and he does not know either why he is on such a low dose of lisinopril  Patient Active Problem List   Diagnosis Date Noted  . Snoring 02/04/2014  . Pleural effusion 12/02/2013  . Emphysema lung (HCC) 12/02/2013  . Systolic dysfunction 11/11/2013  . Diastolic dysfunction 11/11/2013  . Dyspnea 10/20/2013  . PVC's (premature ventricular contractions) 10/20/2013  . Hyperlipidemia 10/20/2013   Outpatient Encounter Prescriptions as of 08/04/2015  Medication Sig  . aspirin 81 MG chewable tablet Chew 81 mg by mouth daily.  . carvedilol (COREG) 12.5 MG tablet TAKE 1 & 1/2 TABLETS BY MOUTH TWICE DAILY  . Clobetasol Prop Emollient Base (CLOBETASOL PROPIONATE E) 0.05 % emollient cream Apply 1 Tube topically 2 (two) times daily.  Marland Kitchen doxazosin (CARDURA) 4 MG tablet Take 1 tablet (4 mg total) by mouth daily.  . finasteride (PROSCAR) 5 MG tablet Take 1 tablet by mouth daily.  . fluticasone (FLONASE) 50 MCG/ACT nasal spray USE TWO SPRAYS EACH NOSTRIL ONCE DAILY.  Marland Kitchen gabapentin (NEURONTIN) 300 MG capsule TAKE ONE CAPSULE BY MOUTH TWICE DAILY  . hydroquinone 4 % cream Apply 1 application topically 2 (two) times daily.  Marland Kitchen lisinopril (PRINIVIL,ZESTRIL) 2.5 MG tablet TAKE ONE TABLET BY MOUTH ONE TIME DAILY  . loratadine (CLARITIN) 10 MG tablet Take 10 mg by mouth at bedtime.  . meloxicam (MOBIC) 15 MG tablet TAKE ONE TABLET  BY MOUTH ONE  TIME DAILY  . pravastatin (PRAVACHOL) 40 MG tablet TAKE ONE TABLET BY MOUTH ONE TIME DAILY  . sertraline (ZOLOFT) 50 MG tablet TAKE ONE TABLET BY MOUTH ONE  TIME DAILY  . tamsulosin (FLOMAX) 0.4 MG CAPS capsule Take 2 capsules (0.8 mg total) by mouth daily.  . traMADol (ULTRAM) 50 MG tablet Take 1 tablet (50 mg total) by mouth 2 (two) times daily.  Lilian Kapur 625 MG tablet TAKE 2 TABLETS BY MOUTH TWICE A DAY WITH A MEAL   No facility-administered encounter medications on file as of 08/04/2015.      Review of Systems  Constitutional: Negative.   Respiratory: Negative.   Cardiovascular: Negative.   Gastrointestinal: Positive for abdominal pain.  Genitourinary: Negative.   Neurological: Negative.        Objective:   Physical Exam  Constitutional: He appears well-developed and well-nourished.  Abdominal: Soft.  Patient was checked for hernia. There is tenderness in the left inguinal area but I cannot definitely feel a impulse against my finger as I palpate the left inguinal ring. It is possible that the ringing is so enlarged that I cannot feel the impulse that I am accustomed to feeling when checking for hernias.          Assessment & Plan:  1. Essential hypertension Blood pressure is concerning today with a diastolic of 185. It has not been this high before. Since he is  on such low-dose ACE inhibitor I suggested that we increase that ever so slightly to 5 mg hopefully without upsetting any other reason why he should be on that low a dose. He will follow-up with his PCP here or with his cardiologist  2. Unilateral inguinal hernia without obstruction or gangrene, recurrence not specified I cannot prove hernia by my exam but clinically and historically he has a hernia. I suggested that we get him follow-up appointment with a general surgeon that he wants to follow-up with his urologist.  Frederica Kuster MD

## 2015-08-05 ENCOUNTER — Other Ambulatory Visit: Payer: Self-pay | Admitting: Family Medicine

## 2015-08-05 MED ORDER — GABAPENTIN 300 MG PO CAPS: 300.0000 mg | ORAL_CAPSULE | Freq: Two times a day (BID) | ORAL | Status: DC

## 2015-08-05 MED ORDER — COLESEVELAM HCL 625 MG PO TABS: ORAL_TABLET | ORAL | Status: DC

## 2015-08-05 NOTE — Telephone Encounter (Signed)
done

## 2015-08-08 ENCOUNTER — Telehealth: Payer: Self-pay | Admitting: Cardiology

## 2015-08-08 NOTE — Telephone Encounter (Signed)
Taylor Cross called wanting to speak with clinic staff about medications.  Please call his cell #

## 2015-08-09 ENCOUNTER — Other Ambulatory Visit: Payer: Self-pay

## 2015-08-09 MED ORDER — SERTRALINE HCL 50 MG PO TABS: ORAL_TABLET | ORAL | Status: DC

## 2015-08-09 MED ORDER — MELOXICAM 15 MG PO TABS: ORAL_TABLET | ORAL | Status: DC

## 2015-08-09 MED ORDER — HYDROQUINONE 4 % EX CREA: 1.0000 "application " | TOPICAL_CREAM | Freq: Two times a day (BID) | CUTANEOUS | Status: DC

## 2015-08-09 NOTE — Telephone Encounter (Signed)
I think increasing his lisionpril is a good idea. Does he have a f/u scheduled with his pcp to recheck his bp?   Dominga Ferry MD

## 2015-08-09 NOTE — Telephone Encounter (Signed)
Yes, can he have nursing bp check in 2 weeks. He should be sure to take his meds at least an hour before   Dominga Ferry MD

## 2015-08-09 NOTE — Telephone Encounter (Signed)
pcp f/u in March, do you want him for BP check before then?

## 2015-08-09 NOTE — Telephone Encounter (Signed)
Pt will come in 08/23/15 @ 2:30PM for BP check. Forwarded to schedulers to add to nurse schedule that day

## 2015-08-09 NOTE — Telephone Encounter (Signed)
Pt says he went to pcp yesterday and BP was 189/91, re-checked 3 times during the coarse of the visit and didn't come down significantly. Pcp recommended pt increase lisinopril 5 mg daily. Pt wanted Dr. Wyline Mood to know this and if this was ok. Will forward

## 2015-08-12 ENCOUNTER — Other Ambulatory Visit: Payer: Self-pay | Admitting: *Deleted

## 2015-08-12 MED ORDER — LISINOPRIL 2.5 MG PO TABS: 5.0000 mg | ORAL_TABLET | Freq: Every day | ORAL | Status: DC

## 2015-08-12 NOTE — Telephone Encounter (Signed)
New prescription sent to pharmacy 

## 2015-08-14 ENCOUNTER — Other Ambulatory Visit: Payer: Self-pay | Admitting: Family Medicine

## 2015-08-16 ENCOUNTER — Ambulatory Visit (INDEPENDENT_AMBULATORY_CARE_PROVIDER_SITE_OTHER): Payer: Medicare Other | Admitting: Family Medicine

## 2015-08-16 ENCOUNTER — Encounter: Payer: Self-pay | Admitting: Family Medicine

## 2015-08-16 VITALS — BP 131/74 | HR 71 | Temp 97.6°F | Ht 68.0 in | Wt 191.6 lb

## 2015-08-16 DIAGNOSIS — J0140 Acute pansinusitis, unspecified: Secondary | ICD-10-CM

## 2015-08-16 DIAGNOSIS — K4090 Unilateral inguinal hernia, without obstruction or gangrene, not specified as recurrent: Secondary | ICD-10-CM

## 2015-08-16 DIAGNOSIS — K469 Unspecified abdominal hernia without obstruction or gangrene: Secondary | ICD-10-CM | POA: Insufficient documentation

## 2015-08-16 MED ORDER — AMOXICILLIN-POT CLAVULANATE 875-125 MG PO TABS: 1.0000 | ORAL_TABLET | Freq: Two times a day (BID) | ORAL | Status: DC

## 2015-08-16 MED ORDER — PSEUDOEPHEDRINE-GUAIFENESIN ER 60-600 MG PO TB12: 1.0000 | ORAL_TABLET | Freq: Two times a day (BID) | ORAL | Status: AC

## 2015-08-16 NOTE — Progress Notes (Signed)
Subjective:  Patient ID: Taylor Cross, male    DOB: 17-Nov-1945  Age: 70 y.o. MRN: [id number]  CC: Sinusitis   HPI Taylor Cross presents for Patient presents with upper respiratory congestion. Rhinorrhea that is frequently purulent. There is moderate sore throat. Patient reports coughing frequently as well.-colored/purulent sputum noted. There is no fever no chills no sweats. The patient denies being short of breath. Onset was 3-5 days ago. Gradually worsening in spite of home remedies.  Also pain bulging Left inguinal area  History Taylor Cross has a past medical history of Hyperlipidemia and Combined systolic and diastolic heart failure (HCC).   He has past surgical history that includes Tonsillectomy.   His family history includes COPD in his father; Diabetes in his mother; Heart disease in his mother.He reports that he has never smoked. He has never used smokeless tobacco. He reports that he does not drink alcohol or use illicit drugs.  Outpatient Prescriptions Prior to Visit  Medication Sig Dispense Refill  . aspirin 81 MG chewable tablet Chew 81 mg by mouth daily.    . carvedilol (COREG) 12.5 MG tablet TAKE 1 & 1/2 TABLETS BY MOUTH TWICE DAILY 180 tablet 2  . Clobetasol Prop Emollient Base (CLOBETASOL PROPIONATE E) 0.05 % emollient cream Apply 1 Tube topically 2 (two) times daily. 60 g 3  . colesevelam (WELCHOL) 625 MG tablet TAKE 2 TABLETS BY MOUTH TWICE A DAY WITH A MEAL 120 tablet 2  . doxazosin (CARDURA) 4 MG tablet Take 1 tablet (4 mg total) by mouth daily. 30 tablet 11  . finasteride (PROSCAR) 5 MG tablet Take 1 tablet by mouth daily.    . fluticasone (FLONASE) 50 MCG/ACT nasal spray USE TWO SPRAYS EACH NOSTRIL ONCE DAILY. 16 g 3  . gabapentin (NEURONTIN) 300 MG capsule Take 1 capsule (300 mg total) by mouth 2 (two) times daily. 60 capsule 2  . hydroquinone 4 % cream Apply 1 application topically 2 (two) times daily. 28.35 g 2  . lisinopril (PRINIVIL,ZESTRIL) 2.5 MG tablet Take 2  tablets (5 mg total) by mouth daily. 60 tablet 4  . loratadine (CLARITIN) 10 MG tablet Take 10 mg by mouth at bedtime.    . meloxicam (MOBIC) 15 MG tablet TAKE ONE TABLET BY MOUTH ONE   TIME DAILY 30 tablet 0  . pravastatin (PRAVACHOL) 40 MG tablet TAKE ONE TABLET BY MOUTH ONE TIME DAILY 30 tablet 4  . sertraline (ZOLOFT) 50 MG tablet TAKE ONE TABLET BY MOUTH ONE   TIME DAILY 30 tablet 1  . tamsulosin (FLOMAX) 0.4 MG CAPS capsule Take 2 capsules (0.8 mg total) by mouth daily. 60 capsule 11  . traMADol (ULTRAM) 50 MG tablet Take 1 tablet (50 mg total) by mouth 2 (two) times daily. 60 tablet 5   No facility-administered medications prior to visit.    ROS Review of Systems  Constitutional: Negative for fever, chills, activity change and appetite change.  HENT: Positive for congestion, postnasal drip, rhinorrhea and sinus pressure. Negative for ear discharge, ear pain, hearing loss, nosebleeds, sneezing and trouble swallowing.   Respiratory: Negative for chest tightness and shortness of breath.   Cardiovascular: Negative for chest pain and palpitations.  Skin: Negative for rash.    Objective:  BP 131/74 mmHg  Pulse 71  Temp(Src) 97.6 F (36.4 C) (Oral)  Ht  (1.727 m)  Wt 191 lb 9.6 oz (86.909 kg)  BMI 29.14 kg/m2  SpO2 97%  BP Readings from Last 3 Encounters:  08/16/15  131/74  08/04/15 185/91  07/06/15 169/89    Wt Readings from Last 3 Encounters:  08/16/15 191 lb 9.6 oz (86.909 kg)  08/04/15 193 lb (87.544 kg)  07/06/15 192 lb 6.4 oz (87.272 kg)     Physical Exam  Constitutional: He appears well-developed and well-nourished.  HENT:  Head: Normocephalic and atraumatic.  Right Ear: Tympanic membrane and external ear normal. No decreased hearing is noted.  Left Ear: Tympanic membrane and external ear normal. No decreased hearing is noted.  Nose: Mucosal edema present. Right sinus exhibits no frontal sinus tenderness. Left sinus exhibits no frontal sinus tenderness.    Mouth/Throat: No oropharyngeal exudate or posterior oropharyngeal erythema.  Neck: No Brudzinski's sign noted.  Pulmonary/Chest: Breath sounds normal. No respiratory distress.  Lymphadenopathy:       Head (right side): No preauricular adenopathy present.       Head (left side): No preauricular adenopathy present.       Right cervical: No superficial cervical adenopathy present.      Left cervical: No superficial cervical adenopathy present.     Lab Results  Component Value Date   WBC 7.3 10/06/2013   HGB 13.0* 10/06/2013   HCT 42.1* 10/06/2013   GLUCOSE 91 04/05/2015   CHOL 137 04/05/2015   TRIG 118 04/05/2015   HDL 50 04/05/2015   LDLCALC 63 04/05/2015   ALT 11 04/05/2015   AST 10 04/05/2015   NA 140 04/05/2015   K 4.9 04/05/2015   CL 100 04/05/2015   CREATININE 1.19 04/05/2015   BUN 22 04/05/2015   CO2 22 04/05/2015   TSH 1.910 10/06/2013    No results found.  Assessment & Plan:   Taylor Cross was seen today for sinusitis.  Diagnoses and all orders for this visit:  Unilateral inguinal hernia without obstruction or gangrene, recurrence not specified -     Ambulatory referral to General Surgery  Acute pansinusitis, recurrence not specified  Other orders -     amoxicillin-clavulanate (AUGMENTIN) 875-125 MG tablet; Take 1 tablet by mouth 2 (two) times daily. Take all of this medication -     pseudoephedrine-guaifenesin (MUCINEX D) 60-600 MG 12 hr tablet; Take 1 tablet by mouth every 12 (twelve) hours. As needed for congestion   I am having Taylor Cross start on amoxicillin-clavulanate and pseudoephedrine-guaifenesin. I am also having him maintain his aspirin, doxazosin, tamsulosin, finasteride, loratadine, traMADol, pravastatin, fluticasone, Clobetasol Prop Emollient Base, carvedilol, colesevelam, gabapentin, hydroquinone, lisinopril, sertraline, and meloxicam.  Meds ordered this encounter  Medications  . amoxicillin-clavulanate (AUGMENTIN) 875-125 MG tablet    Sig: Take  1 tablet by mouth 2 (two) times daily. Take all of this medication    Dispense:  20 tablet    Refill:  0  . pseudoephedrine-guaifenesin (MUCINEX D) 60-600 MG 12 hr tablet    Sig: Take 1 tablet by mouth every 12 (twelve) hours. As needed for congestion    Dispense:  20 tablet    Refill:  0     Follow-up: No Follow-up on file.  [name], M.D.

## 2015-08-23 ENCOUNTER — Ambulatory Visit (INDEPENDENT_AMBULATORY_CARE_PROVIDER_SITE_OTHER): Payer: Medicare Other | Admitting: *Deleted

## 2015-08-23 VITALS — BP 136/82 | HR 62 | Ht 68.0 in | Wt 191.0 lb

## 2015-08-23 DIAGNOSIS — I10 Essential (primary) hypertension: Secondary | ICD-10-CM

## 2015-08-23 NOTE — Progress Notes (Signed)
Patient came to office today for nurse BP check per recent telephone note. Patient's medications were reviewed during visit. Patient has taken all prescribed doses of medications without missing any dose. Patient denies side effects to medications No c/o chest pain, dizziness or sob.

## 2015-08-29 ENCOUNTER — Telehealth: Payer: Self-pay | Admitting: Family Medicine

## 2015-08-29 DIAGNOSIS — K4030 Unilateral inguinal hernia, with obstruction, without gangrene, not specified as recurrent: Secondary | ICD-10-CM

## 2015-08-29 NOTE — Telephone Encounter (Signed)
Pt requested change in referral Referral entered in Epic

## 2015-08-30 NOTE — Progress Notes (Signed)
BP looks good, no changes needed at this time   Dominga Ferry MD

## 2015-09-28 ENCOUNTER — Other Ambulatory Visit: Payer: Self-pay | Admitting: Family Medicine

## 2015-10-04 ENCOUNTER — Encounter: Payer: Self-pay | Admitting: Family Medicine

## 2015-10-04 ENCOUNTER — Ambulatory Visit (INDEPENDENT_AMBULATORY_CARE_PROVIDER_SITE_OTHER): Payer: Medicare Other | Admitting: Family Medicine

## 2015-10-04 VITALS — BP 137/82 | HR 59 | Temp 97.1°F | Ht 68.0 in | Wt 189.5 lb

## 2015-10-04 DIAGNOSIS — M1990 Unspecified osteoarthritis, unspecified site: Secondary | ICD-10-CM | POA: Diagnosis not present

## 2015-10-04 DIAGNOSIS — M542 Cervicalgia: Secondary | ICD-10-CM

## 2015-10-04 DIAGNOSIS — J438 Other emphysema: Secondary | ICD-10-CM

## 2015-10-04 DIAGNOSIS — M47816 Spondylosis without myelopathy or radiculopathy, lumbar region: Secondary | ICD-10-CM

## 2015-10-04 DIAGNOSIS — I10 Essential (primary) hypertension: Secondary | ICD-10-CM

## 2015-10-04 DIAGNOSIS — E785 Hyperlipidemia, unspecified: Secondary | ICD-10-CM | POA: Diagnosis not present

## 2015-10-04 MED ORDER — TRAMADOL HCL 50 MG PO TABS: 50.0000 mg | ORAL_TABLET | Freq: Two times a day (BID) | ORAL | Status: DC

## 2015-10-04 NOTE — Progress Notes (Signed)
Subjective:  Patient ID: Taylor Cross, male    DOB: June 17, 1946  Age: 70 y.o. MRN: [id number]  CC: 6 month follow up   HPI Taylor Cross presents for  follow-up of hypertension. Patient has no history of headache chest pain or shortness of breath or recent cough. Patient also denies symptoms of TIA such as numbness weakness lateralizing. Patient checks  blood pressure at home and has not had any elevated readings recently. Patient denies side effects from his medication. States taking it regularly.  Patient also  in for follow-up of elevated cholesterol. Doing well without complaints on current medication. Denies side effects of statin including myalgia and arthralgia and nausea. Also in today for liver function testing. Currently no chest pain, shortness of breath or other cardiovascular related symptoms noted.  COPD- denies dyspnea. Occasional cough in q AM, scant productivity.   Chronic neck and lower back  pain is 5/10. Reduced to 1-2 with tramadol. Old lumbar MRI confirms degenerative changes. Also compression at L1 noted in 2005  History Taylor Cross has a past medical history of Hyperlipidemia and Combined systolic and diastolic heart failure ([place]).   He has past surgical history that includes Tonsillectomy.   His family history includes COPD in his father; Diabetes in his mother; Heart disease in his mother.He reports that he has never smoked. He has never used smokeless tobacco. He reports that he does not drink alcohol or use illicit drugs.  Current Outpatient Prescriptions on File Prior to Visit  Medication Sig Dispense Refill  . aspirin 81 MG chewable tablet Chew 81 mg by mouth daily.    . carvedilol (COREG) 12.5 MG tablet TAKE 1 & 1/2 TABLETS BY MOUTH TWICE DAILY 180 tablet 2  . Clobetasol Prop Emollient Base (CLOBETASOL PROPIONATE E) 0.05 % emollient cream Apply 1 Tube topically 2 (two) times daily. 60 g 3  . colesevelam (WELCHOL) 625 MG tablet TAKE 2 TABLETS BY MOUTH TWICE A DAY  WITH A MEAL 120 tablet 2  . doxazosin (CARDURA) 4 MG tablet Take 1 tablet (4 mg total) by mouth daily. 30 tablet 11  . finasteride (PROSCAR) 5 MG tablet Take 1 tablet by mouth daily.    . fluticasone (FLONASE) 50 MCG/ACT nasal spray USE TWO SPRAYS EACH NOSTRIL ONCE DAILY. 16 g 3  . gabapentin (NEURONTIN) 300 MG capsule Take 1 capsule (300 mg total) by mouth 2 (two) times daily. 60 capsule 2  . hydroquinone 4 % cream Apply 1 application topically 2 (two) times daily. 28.35 g 2  . lisinopril (PRINIVIL,ZESTRIL) 2.5 MG tablet Take 2 tablets (5 mg total) by mouth daily. 60 tablet 4  . loratadine (CLARITIN) 10 MG tablet Take 10 mg by mouth at bedtime.    . meloxicam (MOBIC) 15 MG tablet TAKE ONE TABLET BY MOUTH ONE   TIME DAILY 30 tablet 0  . pravastatin (PRAVACHOL) 40 MG tablet TAKE 1 TABLET ONCE A DAY 30 tablet 0  . sertraline (ZOLOFT) 50 MG tablet TAKE ONE TABLET BY MOUTH ONE   TIME DAILY 30 tablet 1  . tamsulosin (FLOMAX) 0.4 MG CAPS capsule Take 2 capsules (0.8 mg total) by mouth daily. 60 capsule 11   No current facility-administered medications on file prior to visit.    ROS Review of Systems  Constitutional: Negative for fever, chills, diaphoresis and unexpected weight change.  HENT: Negative for congestion, hearing loss, rhinorrhea and sore throat.   Eyes: Negative for visual disturbance.  Respiratory: Negative for cough and shortness of  breath.   Cardiovascular: Negative for chest pain.  Gastrointestinal: Negative for abdominal pain, diarrhea and constipation.  Genitourinary: Negative for dysuria and flank pain.  Musculoskeletal: Negative for joint swelling and arthralgias.  Skin: Negative for rash.  Neurological: Negative for dizziness and headaches.  Psychiatric/Behavioral: Negative for sleep disturbance and dysphoric mood.    Objective:  BP 137/82 mmHg  Pulse 59  Temp(Src) 97.1 F (36.2 C) (Oral)  Ht 5' 8"  (1.727 m)  Wt 189 lb 8 oz (85.957 kg)  BMI 28.82 kg/m2  BP  Readings from Last 3 Encounters:  10/04/15 137/82  08/23/15 136/82  08/16/15 131/74    Wt Readings from Last 3 Encounters:  10/04/15 189 lb 8 oz (85.957 kg)  08/23/15 191 lb (86.637 kg)  08/16/15 191 lb 9.6 oz (86.909 kg)     Physical Exam  Constitutional: He is oriented to person, place, and time. He appears well-developed and well-nourished. No distress.  HENT:  Head: Normocephalic and atraumatic.  Right Ear: External ear normal.  Left Ear: External ear normal.  Nose: Nose normal.  Mouth/Throat: Oropharynx is clear and moist.  Eyes: Conjunctivae and EOM are normal. Pupils are equal, round, and reactive to light.  Neck: Normal range of motion. Neck supple. No thyromegaly present.  Cardiovascular: Normal rate, regular rhythm and normal heart sounds.   No murmur heard. Pulmonary/Chest: Effort normal and breath sounds normal. No respiratory distress. He has no wheezes. He has no rales.  Abdominal: Soft. Bowel sounds are normal. He exhibits no distension. There is no tenderness.  Lymphadenopathy:    He has no cervical adenopathy.  Neurological: He is alert and oriented to person, place, and time. He has normal reflexes.  Skin: Skin is warm and dry.  Psychiatric: He has a normal mood and affect. His behavior is normal. Judgment and thought content normal.    No results found for: HGBA1C  Lab Results  Component Value Date   WBC 7.3 10/06/2013   HGB 13.0* 10/06/2013   HCT 42.1* 10/06/2013   GLUCOSE 91 04/05/2015   CHOL 137 04/05/2015   TRIG 118 04/05/2015   HDL 50 04/05/2015   LDLCALC 63 04/05/2015   ALT 11 04/05/2015   AST 10 04/05/2015   NA 140 04/05/2015   K 4.9 04/05/2015   CL 100 04/05/2015   CREATININE 1.19 04/05/2015   BUN 22 04/05/2015   CO2 22 04/05/2015   TSH 1.910 10/06/2013    No results found.  Assessment & Plan:   Taylor Cross was seen today for 6 month follow up.  Diagnoses and all orders for this visit:  Other emphysema ([place]) -     CBC with  Differential/Platelet -     CMP14+EGFR  Essential hypertension -     CBC with Differential/Platelet -     CMP14+EGFR  Hyperlipidemia -     CBC with Differential/Platelet -     CMP14+EGFR -     Lipid panel  Arthritis -     CBC with Differential/Platelet -     CMP14+EGFR  Cervicalgia -     CBC with Differential/Platelet -     CMP14+EGFR  Spondylosis of lumbar region without myelopathy or radiculopathy -     CBC with Differential/Platelet -     CMP14+EGFR  Other orders -     traMADol (ULTRAM) 50 MG tablet; Take 1 tablet (50 mg total) by mouth 2 (two) times daily.  conditions above stable on current regimen of meds   I have discontinued Taylor Cross  amoxicillin-clavulanate. I am also having him maintain his aspirin, doxazosin, tamsulosin, finasteride, loratadine, fluticasone, Clobetasol Prop Emollient Base, carvedilol, colesevelam, gabapentin, hydroquinone, lisinopril, sertraline, meloxicam, pravastatin, and traMADol.  Meds ordered this encounter  Medications  . traMADol (ULTRAM) 50 MG tablet    Sig: Take 1 tablet (50 mg total) by mouth 2 (two) times daily.    Dispense:  60 tablet    Refill:  5     Follow-up: Return in about 6 months (around 04/05/2016) for CPE.  Claretta Fraise, M.D.

## 2015-10-05 LAB — CMP14+EGFR
A/G RATIO: 1.8 (ref 1.1–2.5)
ALBUMIN: 4.2 g/dL (ref 3.6–4.8)
ALK PHOS: 63 IU/L (ref 39–117)
ALT: 8 IU/L (ref 0–44)
AST: 12 IU/L (ref 0–40)
BILIRUBIN TOTAL: 0.4 mg/dL (ref 0.0–1.2)
BUN / CREAT RATIO: 25 — AB (ref 10–22)
BUN: 25 mg/dL (ref 8–27)
CHLORIDE: 102 mmol/L (ref 96–106)
CO2: 21 mmol/L (ref 18–29)
Calcium: 8.8 mg/dL (ref 8.6–10.2)
Creatinine, Ser: 0.99 mg/dL (ref 0.76–1.27)
GFR calc Af Amer: 89 mL/min/{1.73_m2} (ref >59)
GFR calc non Af Amer: 77 mL/min/{1.73_m2} (ref >59)
GLUCOSE: 109 mg/dL — AB (ref 65–99)
Globulin, Total: 2.4 g/dL (ref 1.5–4.5)
POTASSIUM: 4.5 mmol/L (ref 3.5–5.2)
Sodium: 140 mmol/L (ref 134–144)
Total Protein: 6.6 g/dL (ref 6.0–8.5)

## 2015-10-05 LAB — CBC WITH DIFFERENTIAL/PLATELET
BASOS ABS: 0 10*3/uL (ref 0.0–0.2)
BASOS: 0 %
EOS (ABSOLUTE): 0.2 10*3/uL (ref 0.0–0.4)
Eos: 4 %
HEMOGLOBIN: 13.6 g/dL (ref 12.6–17.7)
Hematocrit: 41 % (ref 37.5–51.0)
Immature Grans (Abs): 0 10*3/uL (ref 0.0–0.1)
Immature Granulocytes: 0 %
LYMPHS ABS: 1.4 10*3/uL (ref 0.7–3.1)
LYMPHS: 27 %
MCH: 30.6 pg (ref 26.6–33.0)
MCHC: 33.2 g/dL (ref 31.5–35.7)
MCV: 92 fL (ref 79–97)
MONOCYTES: 12 %
Monocytes Absolute: 0.6 10*3/uL (ref 0.1–0.9)
NEUTROS ABS: 3 10*3/uL (ref 1.4–7.0)
Neutrophils: 57 %
PLATELETS: 196 10*3/uL (ref 150–379)
RBC: 4.44 x10E6/uL (ref 4.14–5.80)
RDW: 13.6 % (ref 12.3–15.4)
WBC: 5.2 10*3/uL (ref 3.4–10.8)

## 2015-10-05 LAB — LIPID PANEL
CHOLESTEROL TOTAL: 156 mg/dL (ref 100–199)
Chol/HDL Ratio: 3.1 ratio units (ref 0.0–5.0)
HDL: 51 mg/dL (ref >39)
LDL Calculated: 84 mg/dL (ref 0–99)
Triglycerides: 104 mg/dL (ref 0–149)
VLDL CHOLESTEROL CAL: 21 mg/dL (ref 5–40)

## 2015-10-07 LAB — HGB A1C W/O EAG: HEMOGLOBIN A1C: 5.6 % (ref 4.8–5.6)

## 2015-10-07 LAB — SPECIMEN STATUS REPORT

## 2015-10-17 DIAGNOSIS — N481 Balanitis: Secondary | ICD-10-CM | POA: Insufficient documentation

## 2015-10-20 ENCOUNTER — Telehealth: Payer: Self-pay | Admitting: Family Medicine

## 2015-10-20 ENCOUNTER — Other Ambulatory Visit: Payer: Self-pay | Admitting: Family Medicine

## 2015-10-20 NOTE — Telephone Encounter (Signed)
Pt notified of results Verbalizes understanding 

## 2015-11-08 ENCOUNTER — Encounter: Payer: Self-pay | Admitting: Nurse Practitioner

## 2015-11-08 ENCOUNTER — Ambulatory Visit (INDEPENDENT_AMBULATORY_CARE_PROVIDER_SITE_OTHER): Payer: Medicare Other | Admitting: Nurse Practitioner

## 2015-11-08 VITALS — BP 134/75 | HR 62 | Temp 97.5°F | Ht 68.0 in | Wt 192.0 lb

## 2015-11-08 DIAGNOSIS — J0110 Acute frontal sinusitis, unspecified: Secondary | ICD-10-CM | POA: Diagnosis not present

## 2015-11-08 MED ORDER — AMOXICILLIN-POT CLAVULANATE 875-125 MG PO TABS: 1.0000 | ORAL_TABLET | Freq: Two times a day (BID) | ORAL | Status: DC

## 2015-11-08 NOTE — Progress Notes (Signed)
  Subjective:     Taylor Cross is a 70 y.o. male who presents for evaluation of sinus pain. Symptoms include: congestion, cough, puffiness of the eyes, sinus pressure and sore throat. Onset of symptoms was 7 days ago. Symptoms have been gradually worsening since that time. Past history is significant for no history of pneumonia or bronchitis. Patient is a non-smoker.  The following portions of the patient's history were reviewed and updated as appropriate: allergies, current medications, past family history, past medical history, past social history, past surgical history and problem list.  Review of Systems Pertinent items are noted in HPI.   Objective:    BP 134/75 mmHg  Pulse 62  Temp(Src) 97.5 F (36.4 C) (Oral)  Ht 5\' 8"  (1.727 m)  Wt 192 lb (87.091 kg)  BMI 29.20 kg/m2 General appearance: alert and cooperative Eyes: conjunctivae/corneas clear. PERRL, EOM's intact. Fundi benign. Ears: normal TM's and external ear canals both ears Nose: clear discharge, moderate congestion, turbinates red, no sinus tenderness Throat: lips, mucosa, and tongue normal; teeth and gums normal Neck: no adenopathy, no carotid bruit, no JVD, supple, symmetrical, trachea midline and thyroid not enlarged, symmetric, no tenderness/mass/nodules Lungs: clear to auscultation bilaterally Heart: regular rate and rhythm, S1, S2 normal, no murmur, click, rub or gallop    Assessment:    Acute bacterial sinusitis.    Plan:  1. Acute frontal sinusitis, recurrence not specified [J01.10] 1. Take meds as prescribed 2. Use a cool mist humidifier especially during the winter months and when heat has been humid. 3. Use saline nose sprays frequently 4. Saline irrigations of the nose can be very helpful if done frequently.  * 4X daily for 1 week*  * Use of a nettie pot can be helpful with this. Follow directions with this* 5. Drink plenty of fluids 6. Keep thermostat turn down low 7.For any cough or congestion  Use  plain Mucinex- regular strength or max strength is fine   * Children- consult with Pharmacist for dosing 8. For fever or aces or pains- take tylenol or ibuprofen appropriate for age and weight.  * for fevers greater than 101 orally you may alternate ibuprofen and tylenol every  3 hours.   - amoxicillin-clavulanate (AUGMENTIN) 875-125 MG tablet; Take 1 tablet by mouth 2 (two) times daily.  Dispense: 20 tablet; Refill: 0  Mary-Margaret Daphine Deutscher, FNP

## 2015-11-08 NOTE — Patient Instructions (Signed)

## 2015-11-21 ENCOUNTER — Other Ambulatory Visit: Payer: Self-pay | Admitting: Family Medicine

## 2015-12-20 ENCOUNTER — Other Ambulatory Visit: Payer: Self-pay | Admitting: Family Medicine

## 2015-12-27 ENCOUNTER — Other Ambulatory Visit: Payer: Self-pay | Admitting: Family Medicine

## 2016-01-09 ENCOUNTER — Ambulatory Visit: Payer: BLUE CROSS/BLUE SHIELD | Admitting: Cardiology

## 2016-01-09 ENCOUNTER — Other Ambulatory Visit: Payer: Self-pay | Admitting: Cardiology

## 2016-01-17 ENCOUNTER — Ambulatory Visit: Payer: BLUE CROSS/BLUE SHIELD | Admitting: Cardiology

## 2016-01-19 ENCOUNTER — Other Ambulatory Visit: Payer: Self-pay | Admitting: Family Medicine

## 2016-02-07 ENCOUNTER — Ambulatory Visit (INDEPENDENT_AMBULATORY_CARE_PROVIDER_SITE_OTHER): Payer: Medicare Other | Admitting: Cardiology

## 2016-02-07 ENCOUNTER — Encounter: Payer: Self-pay | Admitting: Cardiology

## 2016-02-07 VITALS — BP 134/76 | HR 61 | Ht 68.0 in | Wt 191.0 lb

## 2016-02-07 DIAGNOSIS — E785 Hyperlipidemia, unspecified: Secondary | ICD-10-CM

## 2016-02-07 DIAGNOSIS — I5022 Chronic systolic (congestive) heart failure: Secondary | ICD-10-CM

## 2016-02-07 DIAGNOSIS — I10 Essential (primary) hypertension: Secondary | ICD-10-CM | POA: Diagnosis not present

## 2016-02-07 NOTE — Progress Notes (Signed)
Clinical Summary Mr. Kohlmeyer is a 70 y.o.male seen today for follow up of the following medical problems.   1. Chronic Systolic/Diastolic heart failure  - 10/2013 echo which showed LVEF 40-45%, mid-inferolateral wall hypokinesis, and grade II diastolic dysfunction.  - repeat echo 08/2014 LVEF 35-40%, basal to mid inferolateral and inferior walls, grade I diastolic dysfunction - completed lexiscan which showed moderate sized moderate to severe intensity inferolateral wall defect and small apical to mid inferior wall defect. Both areas were fixed and consistent with scar.   - no SOB or DOE. Denies any LE edema.  - compliant with meds. Does not weigh regularly at home.    2. HL - compliant with statin - 09/2015 TC 156 TG 104  HDL 51 LDL 84  3. HTN - compliant with meds     Past Medical History  Diagnosis Date  . Hyperlipidemia   . Combined systolic and diastolic heart failure ([facility])      Allergies  Allergen Reactions  . Neosporin [Neomycin-Bacitracin Zn-Polymyx] Rash  . Voltaren [Diclofenac Sodium] Other (See Comments)    GI upset     Current Outpatient Prescriptions  Medication Sig Dispense Refill  . amoxicillin-clavulanate (AUGMENTIN) 875-125 MG tablet Take 1 tablet by mouth 2 (two) times daily. 20 tablet 0  . aspirin 81 MG chewable tablet Chew 81 mg by mouth daily.    . carvedilol (COREG) 12.5 MG tablet TAKE 1 & 1/2 TABLETS BY MOUTH TWICE DAILY 180 tablet 2  . Clobetasol Prop Emollient Base (CLOBETASOL PROPIONATE E) 0.05 % emollient cream Apply 1 Tube topically 2 (two) times daily. 60 g 3  . doxazosin (CARDURA) 4 MG tablet Take 1 tablet (4 mg total) by mouth daily. 30 tablet 11  . finasteride (PROSCAR) 5 MG tablet Take 1 tablet by mouth daily.    . fluticasone (FLONASE) 50 MCG/ACT nasal spray USE TWO SPRAYS EACH NOSTRIL ONCE DAILY. 16 g 3  . gabapentin (NEURONTIN) 300 MG capsule Take 1 capsule (300 mg total) by mouth 2 (two) times daily. 60 capsule 2  .  hydroquinone 4 % cream Apply 1 application topically 2 (two) times daily. 28.35 g 2  . lisinopril (PRINIVIL,ZESTRIL) 2.5 MG tablet TAKE 2 TABLETS ONCE DAILY AS DIRECTED 60 tablet 3  . loratadine (CLARITIN) 10 MG tablet Take 10 mg by mouth at bedtime.    . meloxicam (MOBIC) 15 MG tablet TAKE ONE TABLET BY MOUTH ONE   TIME DAILY 30 tablet 0  . meloxicam (MOBIC) 15 MG tablet TAKE ONE TABLET BY MOUTH ONE TIME DAILY 30 tablet 0  . pravastatin (PRAVACHOL) 40 MG tablet TAKE 1 TABLET ONCE A DAY 30 tablet 5  . sertraline (ZOLOFT) 50 MG tablet TAKE ONE TABLET BY MOUTH ONE   TIME DAILY 30 tablet 1  . sertraline (ZOLOFT) 50 MG tablet TAKE ONE TABLET BY MOUTH ONE TIME DAILY 30 tablet 0  . tamsulosin (FLOMAX) 0.4 MG CAPS capsule Take 2 capsules (0.8 mg total) by mouth daily. 60 capsule 11  . traMADol (ULTRAM) 50 MG tablet Take 1 tablet (50 mg total) by mouth 2 (two) times daily. 60 tablet 5  . WELCHOL 625 MG tablet TAKE 2 TABLETS 2 TIMES A DAY WITH MEALS 120 tablet 2   No current facility-administered medications for this visit.     Past Surgical History  Procedure Laterality Date  . Tonsillectomy       Allergies  Allergen Reactions  . Neosporin [Neomycin-Bacitracin Zn-Polymyx] Rash  . Voltaren [  Diclofenac Sodium] Other (See Comments)    GI upset      Family History  Problem Relation Age of Onset  . Heart disease Mother   . Diabetes Mother   . COPD Father      Social History Mr. Kepple reports that he has never smoked. He has never used smokeless tobacco. Mr. Rostron reports that he does not drink alcohol.   Review of Systems CONSTITUTIONAL: No weight loss, fever, chills, weakness or fatigue.  HEENT: Eyes: No visual loss, blurred vision, double vision or yellow sclerae.No hearing loss, sneezing, congestion, runny nose or sore throat.  SKIN: No rash or itching.  CARDIOVASCULAR: per HPI RESPIRATORY: No shortness of breath, cough or sputum.  GASTROINTESTINAL: No anorexia, nausea,  vomiting or diarrhea. No abdominal pain or blood.  GENITOURINARY: No burning on urination, no polyuria NEUROLOGICAL: No headache, dizziness, syncope, paralysis, ataxia, numbness or tingling in the extremities. No change in bowel or bladder control.  MUSCULOSKELETAL: No muscle, back pain, joint pain or stiffness.  LYMPHATICS: No enlarged nodes. No history of splenectomy.  PSYCHIATRIC: No history of depression or anxiety.  ENDOCRINOLOGIC: No reports of sweating, cold or heat intolerance. No polyuria or polydipsia.  Marland Kitchen   Physical Examination Filed Vitals:   02/07/16 1455  BP: 134/76  Pulse: 61   Filed Vitals:   02/07/16 1455  Height:  (1.727 m)  Weight: 191 lb (86.637 kg)    Gen: resting comfortably, no acute distress HEENT: no scleral icterus, pupils equal round and reactive, no palptable cervical adenopathy,  CV: RRR, no m/r/g, no jvd Resp: Clear to auscultation bilaterally GI: abdomen is soft, non-tender, non-distended, normal bowel sounds, no hepatosplenomegaly MSK: extremities are warm, no edema.  Skin: warm, no rash Neuro:  no focal deficits Psych: appropriate affect   Diagnostic Studies 10/2013 Echo  Left ventricle: The cavity size was mildly dilated. Wall thickness was normal. Systolic function was mildly to moderately reduced. The estimated ejection fraction is 40%. Features are consistent with a pseudonormal left ventricular filling pattern, with concomitant abnormal relaxation and increased filling pressure (grade 2 diastolic dysfunction). Doppler parameters are consistent with high ventricular filling pressure. - Regional wall motion abnormality: Mild to moderate hypokinesis of the mid inferolateral myocardium. The remaining walls are mildly hypokinetic. - Aortic valve: Trileaflet; mildly thickened, mildly calcified leaflets. There was no stenosis. - Mitral valve: Mildly thickened leaflets . Mild to moderate regurgitation. - Left atrium: The atrium was  moderately dilated. - Tricuspid valve: Mild regurgitation. Inadequate TR jet to accurately assess pulmonary pressures. - Pericardium, extracardiac: There was a left pleural effusion.   11/11/13 Lexiscan MPI  Gated imaging reveals an EDV of 223, ESV of 153, TID ratio 0.99, and LVEF of 31% with global hypokinesis, most prominent in the apex and inferolateral walls.  IMPRESSION: Intermediate risk Lexiscan Cardiolite. No diagnostic ST segment changes were noted to indicate ischemia, frequent PVCs were seen without sustained arrhythmia. Perfusion imaging is most consistent with scar affecting the inferior wall and inferolateral wall as outlined, no large ischemic zones however. LVEF is calculated at 31% with global hypokinesis that is most prominent at the apex and inferior wall. EDV indicates moderate to severe chamber dilatation. Findings are consistent with probable ischemic cardiomyopathy.   08/2014 echo Study Conclusions  - Left ventricle: The cavity size was mildly dilated. Wall thickness was increased in a pattern of moderate LVH. Systolic function was moderately reduced. The estimated ejection fraction was in the range of 35% to 40%. There is  hypokinesis to akinesis of the basal-mid inferolateral and inferior myocardium. Doppler parameters are consistent with abnormal left ventricular relaxation (grade 1 diastolic dysfunction). - Aortic valve: Mildly to moderately calcified annulus. Trileaflet. - Mitral valve: Mildly calcified annulus. There was mild regurgitation. - Left atrium: The atrium was mildly to moderately dilated. - Right atrium: Central venous pressure (est): 3 mm Hg. - Atrial septum: Possible PFO or secundum ASD with apparent left to right flow noted by color Doppler. Cannot exclude vigorous venous return adjacent to septum. Agitated saline study could be considered for further evaluation. - Tricuspid valve: There was trivial  regurgitation. - Pulmonary arteries: PA peak pressure: 17 mm Hg (S). - Pericardium, extracardiac: There was no pericardial effusion.  Recommendations: Moderate LVH with mild LV chamber dilatation and LVEF approximately 35-40%. Hypokinesis to akinesis of the mid to basal inferolateral/inferior walls noted suggestive of ischemic cardiomyopathy. Grade 1 diastolic dysfunction. Compared to prior study in April 2015, wall motion has improved somewhat and ejection fraction looks to be higher, although graded in similar range last time. Mild to moderate left atrial enlargement. Mild MAC with mild mitral regurgitation. Possible PFO or secundum ASD with apparent left to right flow noted by color Doppler. Cannot exclude vigorous venous return adjacent to septum. Agitated saline study could be considered for further evaluation. Trivial tricuspid regurgitation with PASP normal range.         Assessment and Plan  1.Chronic Systolic/diastolic heart failure - Echo 07/624. LVEF 40-45%. Repeat echo 08/2014 LVEF 35-40%. Eugenie Birks suggests this is an ischemic cardiomyopathy with evidence of prior scar, no active ischemia. He has not been in favor of invasive testing.  - no current symptoms. Medical therapy has been limited due to orthostatic dizziness.  - we will conitnue current meds  2. HTN - - at goal. He wil ontinue current meds  3. HL - continue current statin - request labs from pcp   F/u 6 months      Antoine Poche, M.D.

## 2016-02-07 NOTE — Patient Instructions (Signed)

## 2016-02-20 ENCOUNTER — Other Ambulatory Visit: Payer: Self-pay | Admitting: Family Medicine

## 2016-02-28 IMAGING — NM NM MYOCAR SINGLE W/SPECT W/WALL MOTION & EF
1 series · 6 of 6 positions shown · non-contrast
Comparison: none

CLINICAL DATA: 68-year-old male with history of hyperlipidemia,
cardiomyopathy, and PVCs. This study is requested to evaluate for
the presence and extent of ischemia.

EXAM:
MYOCARDIAL IMAGING WITH SPECT (REST AND PHARMACOLOGIC-STRESS)
GATED LEFT VENTRICULAR WALL MOTION STUDY
LEFT VENTRICULAR EJECTION FRACTION
TECHNIQUE: Standard myocardial SPECT imaging was performed after resting
intravenous injection of 10 mCi 8c-YYm sestamibi. Subsequently,
intravenous infusion of Lexiscan was performed under the supervision
of the Cardiology staff. At peak effect of the drug, 30 mCi 8c-YYm
sestamibi was injected intravenously and standard myocardial SPECT
imaging was performed. Quantitative gated imaging was also performed
to evaluate left ventricular wall motion, and estimate left
ventricular ejection fraction.

[Series 1: cs cardiac tc hi dose · 6.41mm/px · 6 of 512 frames shown]
[frame 43/512]
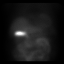
[frame 128/512]
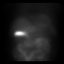
[frame 214/512]
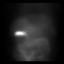
[frame 299/512]
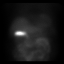
[frame 384/512]
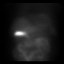
[frame 470/512]
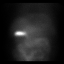

[6 of 6 positions shown; findings below may reference images not displayed]

FINDINGS: Baseline tracing shows sinus rhythm at 69 beats per min with poor R
wave progression anteriorly and PVCs, nonspecific T-wave changes.
Lexiscan bolus was given in standard fashion. Heart rate increased
from 71 beats per min up to 86 beats per min, and blood pressure
increased from 120/100 up to 129/87. No chest pain was reported. No
diagnostic ST segment changes were noted over baseline. Frequent
PVCs noted throughout without sustained arrhythmia.

Analysis of the overall perfusion data finds adequate radiotracer
uptake.

Tomographic views were obtained using the short axis, vertical long
axis, and horizontal long axis planes. There is a moderate-sized,
moderate to severe intensity, inferolateral defect extending from
mid to basal ventricular level. There is an additional small defect
of moderate intensity in the apical to mid inferior wall. These
regions are fixed and most consistent with scar.

Gated imaging reveals an EDV of 223, ESV of 153, TID ratio 0.99, and
LVEF of 31% with global hypokinesis, most prominent in the apex and
inferolateral walls.
IMPRESSION: Intermediate risk Lexiscan Cardiolite. No diagnostic ST segment
changes were noted to indicate ischemia, frequent PVCs were seen
without sustained arrhythmia. Perfusion imaging is most consistent
with scar affecting the inferior wall and inferolateral wall as
outlined, no large ischemic zones however. LVEF is calculated at 31%
with global hypokinesis that is most prominent at the apex and
inferior wall. EDV indicates moderate to severe chamber dilatation.
Findings are consistent with probable ischemic cardiomyopathy.

## 2016-03-08 ENCOUNTER — Ambulatory Visit (INDEPENDENT_AMBULATORY_CARE_PROVIDER_SITE_OTHER): Payer: Medicare Other | Admitting: Pediatrics

## 2016-03-08 ENCOUNTER — Encounter: Payer: Self-pay | Admitting: Pediatrics

## 2016-03-08 VITALS — BP 152/88 | HR 66 | Temp 98.7°F | Ht 68.0 in | Wt 193.4 lb

## 2016-03-08 DIAGNOSIS — L97912 Non-pressure chronic ulcer of unspecified part of right lower leg with fat layer exposed: Secondary | ICD-10-CM | POA: Diagnosis not present

## 2016-03-08 DIAGNOSIS — I10 Essential (primary) hypertension: Secondary | ICD-10-CM

## 2016-03-08 NOTE — Progress Notes (Signed)
    Subjective:    Patient ID: Taylor Cross, male    DOB: [birth year], 70 y.o.   MRN: [id number]  CC: Wound Infection (left leg 3-4 weeks)   HPI: Taylor Cross is a 70 y.o. male presenting for Wound Infection (left leg 3-4 weeks)  Noticed spot on leg 3-4 weeks ago Some days seem sto get better, other days worse Doesn't remember how it started Doesn't hurt Some redness around it at times No prior ulcers on legs No other skin rashes or lesions No fevers, feeling well otherwise   Relevant past medical, surgical, family and social history reviewed and updated. Interim medical history since our last visit reviewed. Allergies and medications reviewed and updated.  History  Smoking Status  . Never Smoker  Smokeless Tobacco  . Never Used    ROS: Per HPI      Objective:    BP (!) 152/88   Pulse 66   Temp 98.7 F (37.1 C) (Oral)   Ht 5\' 8"  (1.727 m)   Wt 193 lb 6.4 oz (87.7 kg)   BMI 29.41 kg/m   Wt Readings from Last 3 Encounters:  03/08/16 193 lb 6.4 oz (87.7 kg)  02/07/16 191 lb (86.6 kg)  11/08/15 192 lb (87.1 kg)    Gen: NAD, alert, cooperative with exam, NCAT EYES: EOMI, no scleral injection or icterus CV: NRRR, normal S1/S2, no murmur, cap refill 2 sec L foot Resp: CTABL, no wheezes, normal WOB Ext: No edema, warm Neuro: Alert and oriented Skin: L medial lower leg above ankle with 1cm circular ulcer, minimal depth, fat visible throughout, minimal drainage, some surrounding redness, no induration, minmal tenderness with palpation     Assessment & Plan:    Taylor Cross was seen today for leg ulcer.  Diagnoses and all orders for this visit:  Leg ulcer, right, with fat layer exposed (HCC)  Placed duoderm on ulcer. Pt to change in 3 days, back in clinic to see in 1 week. Any worsening, fevers, come back sooner.  HTN Elevated today Recheck next week  Follow up plan: 1 week  Rex Kras, MD [place] [facility] 03/08/2016, 10:25 AM

## 2016-03-08 NOTE — Patient Instructions (Signed)
Change dressing on Sunday. Small disk to wound. Keep area dry. OK to change the gauze more frequently if needed.

## 2016-03-15 ENCOUNTER — Encounter: Payer: Self-pay | Admitting: Pediatrics

## 2016-03-15 ENCOUNTER — Ambulatory Visit (INDEPENDENT_AMBULATORY_CARE_PROVIDER_SITE_OTHER): Payer: Medicare Other | Admitting: Pediatrics

## 2016-03-15 VITALS — BP 139/83 | HR 65 | Temp 97.3°F | Ht 68.0 in | Wt 191.0 lb

## 2016-03-15 DIAGNOSIS — I10 Essential (primary) hypertension: Secondary | ICD-10-CM | POA: Diagnosis not present

## 2016-03-15 DIAGNOSIS — L97929 Non-pressure chronic ulcer of unspecified part of left lower leg with unspecified severity: Secondary | ICD-10-CM

## 2016-03-15 NOTE — Patient Instructions (Signed)
Change duoderm bandage Sunday and Thursday

## 2016-03-15 NOTE — Progress Notes (Signed)
    Subjective:    Patient ID: Taylor Cross, male    DOB: Apr 19, 1946, 70 y.o.   MRN: [id number]  CC: Wound Check   HPI: Taylor Cross is a 70 y.o. male presenting for Wound Check  Treated last week for leg ulcer Pain improved, rarely notices it now Changed dressing 3 days ago, replaced with new duoderm as discussed Minimal drainage Pt thinks it definitely feels better, thinks it looked a little better when he changed as well No fevers Feeling well otherwise  Relevant past medical, surgical, family and social history reviewed. Interim medical history since our last visit reviewed. Allergies and medications reviewed and updated.  History  Smoking Status  . Never Smoker  Smokeless Tobacco  . Never Used    ROS: Per HPI      Objective:    BP 139/83   Pulse 65   Temp 97.3 F (36.3 C) (Oral)   Ht 5\' 8"  (1.727 m)   Wt 191 lb (86.6 kg)   BMI 29.04 kg/m   Wt Readings from Last 3 Encounters:  03/15/16 191 lb (86.6 kg)  03/08/16 193 lb 6.4 oz (87.7 kg)  02/07/16 191 lb (86.6 kg)     Gen: NAD, alert, cooperative with exam, NCAT EYES: EOMI, no conjunctival injection, or no icterus CV: distal pulses 2+ b/l Resp: normal WOB Ext: No edema, warm Neuro: Alert and oriented Skin: L medial lower leg, ulcer slightly smaller, now 83mm in diameter. Minimal surrounding redness. Minimal yellow discharge present     Assessment & Plan:  [facility]angel was seen today for wound check.  Diagnoses and all orders for this visit:  Leg ulcer, left, with unspecified severity (HCC) Improved Smaller, less redness Continue duoderm Change twice a week RTC 2 weeks for recheck unless skin is healed over  Essential hypertension Adequate control, cont meds  Follow up plan: Return in about 2 weeks (around 03/29/2016) for wound check.  Rex Kras, MD [facility]

## 2016-03-20 ENCOUNTER — Other Ambulatory Visit: Payer: Self-pay | Admitting: Nurse Practitioner

## 2016-03-26 ENCOUNTER — Other Ambulatory Visit: Payer: Self-pay | Admitting: Family Medicine

## 2016-04-06 ENCOUNTER — Ambulatory Visit (INDEPENDENT_AMBULATORY_CARE_PROVIDER_SITE_OTHER): Payer: Medicare Other | Admitting: Family Medicine

## 2016-04-06 ENCOUNTER — Encounter: Payer: Self-pay | Admitting: Family Medicine

## 2016-04-06 VITALS — BP 129/80 | HR 66 | Temp 97.2°F | Ht 68.0 in | Wt 189.4 lb

## 2016-04-06 DIAGNOSIS — Z125 Encounter for screening for malignant neoplasm of prostate: Secondary | ICD-10-CM

## 2016-04-06 DIAGNOSIS — E559 Vitamin D deficiency, unspecified: Secondary | ICD-10-CM

## 2016-04-06 DIAGNOSIS — Z0000 Encounter for general adult medical examination without abnormal findings: Secondary | ICD-10-CM | POA: Insufficient documentation

## 2016-04-06 DIAGNOSIS — E785 Hyperlipidemia, unspecified: Secondary | ICD-10-CM

## 2016-04-06 DIAGNOSIS — Z23 Encounter for immunization: Secondary | ICD-10-CM

## 2016-04-06 DIAGNOSIS — I10 Essential (primary) hypertension: Secondary | ICD-10-CM

## 2016-04-06 MED ORDER — MUPIROCIN 2 % EX OINT: 1.0000 "application " | TOPICAL_OINTMENT | Freq: Two times a day (BID) | CUTANEOUS | 5 refills | Status: DC

## 2016-04-06 NOTE — Progress Notes (Signed)
Subjective:  Patient ID: Taylor Cross, male    DOB: 06-Jul-1946  Age: 70 y.o. MRN: [id number]  Presents for wellness exam    Current Medications (verified) Outpatient Encounter Prescriptions as of 04/06/2016  Medication Sig  . aspirin 81 MG chewable tablet Chew 81 mg by mouth daily.  . carvedilol (COREG) 12.5 MG tablet TAKE 1 & 1/2 TABLETS BY MOUTH TWICE DAILY  . Clobetasol Prop Emollient Base (CLOBETASOL PROPIONATE E) 0.05 % emollient cream Apply 1 Tube topically 2 (two) times daily.  . finasteride (PROSCAR) 5 MG tablet Take 1 tablet by mouth daily.  Marland Kitchen gabapentin (NEURONTIN) 300 MG capsule Take 1 capsule (300 mg total) by mouth 2 (two) times daily.  . hydroquinone 4 % cream Apply 1 application topically 2 (two) times daily.  Marland Kitchen lisinopril (PRINIVIL,ZESTRIL) 2.5 MG tablet TAKE 2 TABLETS ONCE DAILY AS DIRECTED  . loratadine (CLARITIN) 10 MG tablet Take 10 mg by mouth at bedtime.  . meloxicam (MOBIC) 15 MG tablet TAKE ONE TABLET BY MOUTH ONE TIME DAILY  . pravastatin (PRAVACHOL) 40 MG tablet TAKE 1 TABLET ONCE A DAY  . sertraline (ZOLOFT) 50 MG tablet TAKE ONE TABLET BY MOUTH ONE TIME DAILY  . tamsulosin (FLOMAX) 0.4 MG CAPS capsule Take 2 capsules (0.8 mg total) by mouth daily.  . traMADol (ULTRAM) 50 MG tablet Take 1 tablet (50 mg total) by mouth 2 (two) times daily.  . WELCHOL 625 MG tablet TAKE 2 TABLETS 2 TIMES A DAY WITH MEALS  . [DISCONTINUED] fluticasone (FLONASE) 50 MCG/ACT nasal spray USE TWO SPRAYS EACH NOSTRIL ONCE DAILY.  . mupirocin ointment (BACTROBAN) 2 % Apply 1 application topically 2 (two) times daily. To affected areas   No facility-administered encounter medications on file as of 04/06/2016.     Allergies (verified) Neosporin [neomycin-bacitracin zn-polymyx] and Voltaren [diclofenac sodium]   History: Past Medical History:  Diagnosis Date  . 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 Hyperlipidemia    Past Surgical History:  Procedure Laterality  Date  . HERNIA REPAIR    . TONSILLECTOMY     Family History  Problem Relation Age of Onset  . Heart disease Mother   . Diabetes Mother   . COPD Father    Social History   Occupational History  . Not on file.   Social History Main Topics  . Smoking status: Never Smoker  . Smokeless tobacco: Never Used  . Alcohol use No  . Drug use: No  . Sexual activity: Not on file    Do you feel safe at home?  Yes Are there smokers in your home (other than you)? No  Dietary issues and exercise activities discussed:    Current Dietary habits:  Regular diet       Objective:    Today's Vitals   04/06/16 1112  BP: 129/80  Pulse: 66  Temp: 97.2 F (36.2 C)  TempSrc: Oral  Weight: 189 lb 6 oz (85.9 kg)  Height: _0  (1.727 m)   Body mass index is 28.79 kg/m.   Activities of Daily Living No flowsheet data found.   Depression Screen PHQ 2/9 Scores 04/06/2016 03/15/2016 03/08/2016 10/04/2015  PHQ - 2 Score 0 0 0 0  PHQ- 9 Score - - - -     Fall Risk Fall Risk  04/06/2016 03/15/2016 03/08/2016 10/04/2015 09/30/2014  Falls in the past year? _1     Cognitive Function: alert, oriented. Cognition appropriate. MMSE Testing not indicated  No flowsheet data found.  Immunizations and Health Maintenance Immunization History  Administered Date(s) Administered  . Influenza,inj,Quad PF,36+ Mos 05/02/2015  . Influenza-Unspecified 04/29/2013, 05/05/2014  . Pneumococcal Conjugate-13 04/06/2016  . Pneumococcal Polysaccharide-23 04/07/2013  . Tdap 10/06/2013   Health Maintenance Due  Topic Date Due  . Hepatitis C Screening  1946-02-09  . COLONOSCOPY  11/10/1995  . INFLUENZA VACCINE  02/28/2016    Patient Care Team: Claretta Fraise, MD as PCP - General (Family Medicine)   Screening Tests Health Maintenance  Topic Date Due  . Hepatitis C Screening  08/10/1945  . COLONOSCOPY  11/10/1995  . INFLUENZA VACCINE  02/28/2016  . TETANUS/TDAP  10/07/2023  . ZOSTAVAX  Completed  .  PNA vac Low Risk Adult  Completed    History Costas has a past medical history of Combined systolic and diastolic heart failure (HCC) and Hyperlipidemia.   He has a past surgical history that includes Tonsillectomy and Hernia repair.   His family history includes COPD in his father; Diabetes in his mother; Heart disease in his mother.He reports that he has never smoked. He has never used smokeless tobacco. He reports that he does not drink alcohol or use drugs.    ROS Review of Systems  Constitutional: Negative for activity change, appetite change, chills, diaphoresis, fatigue, fever and unexpected weight change.  HENT: Negative for congestion, ear pain, hearing loss, postnasal drip, rhinorrhea, sore throat, tinnitus and trouble swallowing.   Eyes: Negative for photophobia, pain, discharge and redness.  Respiratory: Negative for apnea, cough, choking, chest tightness, shortness of breath, wheezing and stridor.   Cardiovascular: Negative for chest pain, palpitations and leg swelling.  Gastrointestinal: Negative for abdominal distention, abdominal pain, blood in stool, constipation, diarrhea, nausea and vomiting.  Endocrine: Negative for cold intolerance, heat intolerance, polydipsia, polyphagia and polyuria.  Genitourinary: Negative for difficulty urinating, dysuria, enuresis, flank pain, frequency, genital sores, hematuria and urgency.  Musculoskeletal: Negative for arthralgias and joint swelling.  Skin: Negative for color change, rash and wound.  Allergic/Immunologic: Negative for immunocompromised state.  Neurological: Negative for dizziness, tremors, seizures, syncope, facial asymmetry, speech difficulty, weakness, light-headedness, numbness and headaches.  Hematological: Does not bruise/bleed easily.  Psychiatric/Behavioral: Negative for agitation, behavioral problems, confusion, decreased concentration, dysphoric mood, hallucinations, sleep disturbance and suicidal ideas. The patient is  not nervous/anxious and is not hyperactive.     Objective:  BP 129/80   Pulse 66   Temp 97.2 F (36.2 C) (Oral)   Ht '5\' 8"'$  (1.727 m)   Wt 189 lb 6 oz (85.9 kg)   BMI 28.79 kg/m   BP Readings from Last 3 Encounters:  04/06/16 129/80  03/15/16 139/83  03/08/16 (!) 152/88    Wt Readings from Last 3 Encounters:  04/06/16 189 lb 6 oz (85.9 kg)  03/15/16 191 lb (86.6 kg)  03/08/16 193 lb 6.4 oz (87.7 kg)     Physical Exam  Constitutional: He is oriented to person, place, and time. He appears well-developed and well-nourished.  HENT:  Head: Normocephalic and atraumatic.  Mouth/Throat: Oropharynx is clear and moist.  Eyes: EOM are normal. Pupils are equal, round, and reactive to light.  Neck: Normal range of motion. No tracheal deviation present. No thyromegaly present.  Cardiovascular: Normal rate, regular rhythm and normal heart sounds.  Exam reveals no gallop and no friction rub.   No murmur heard. Pulmonary/Chest: Breath sounds normal. He has no wheezes. He has no rales.  Abdominal: Soft. He exhibits no mass. There is no tenderness.  Musculoskeletal: Normal range of motion. He exhibits no edema.  Neurological: He is alert and oriented to person, place, and time.  Skin: Skin is warm and dry.  Psychiatric: He has a normal mood and affect.    Lab Results  Component Value Date   WBC 5.2 10/04/2015   HGB 13.0 (A) 10/06/2013   HCT 41.0 10/04/2015   PLT 196 10/04/2015   GLUCOSE 109 (H) 10/04/2015   CHOL 156 10/04/2015   TRIG 104 10/04/2015   HDL 51 10/04/2015   LDLCALC 84 10/04/2015   ALT 8 10/04/2015   AST 12 10/04/2015   NA 140 10/04/2015   K 4.5 10/04/2015   CL 102 10/04/2015   CREATININE 0.99 10/04/2015   BUN 25 10/04/2015   CO2 21 10/04/2015   TSH 1.910 10/06/2013   HGBA1C 5.6 10/04/2015    No results found.  Assessment & Plan:   Brogen was seen today for annual exam.  Diagnoses and all orders for this visit:  Hyperlipidemia -     CBC with  Differential/Platelet -     CMP14+EGFR -     Lipid panel -     Urinalysis  Medicare annual wellness visit, subsequent -     CBC with Differential/Platelet -     CMP14+EGFR -     Urinalysis  Essential hypertension -     CBC with Differential/Platelet -     CMP14+EGFR -     Urinalysis  Vitamin D deficiency -     VITAMIN D 25 Hydroxy (Vit-D Deficiency, Fractures)  Screening for prostate cancer -     PSA Total (Reflex To Free)  Other orders -     mupirocin ointment (BACTROBAN) 2 %; Apply 1 application topically 2 (two) times daily. To affected areas    I am having Mr. Depree start on mupirocin ointment. I am also having him maintain his aspirin, tamsulosin, finasteride, loratadine, fluticasone, Clobetasol Prop Emollient Base, carvedilol, hydroquinone, traMADol, pravastatin, lisinopril, sertraline, gabapentin, WELCHOL, and meloxicam.  Meds ordered this encounter  Medications  . mupirocin ointment (BACTROBAN) 2 %    Sig: Apply 1 application topically 2 (two) times daily. To affected areas    Dispense:  22 g    Refill:  5       Plan:   During the course of the visit Macrae was educated and counseled about the following appropriate screening and preventive services:   Vaccines to include Pneumoccal, Influenza,  Td, Zostavax,  Colorectal cancer screening  Cardiovascular disease screening  Diabetes screening  Bone Denisty / Osteoporosis Screening  Glaucoma screening / Diabetic Eye Exam  Nutrition counseling  Prostate cancer screening  Smoking cessation counseling  Advanced Directives  Physical Activity   Goals    None       Patient Instructions (the written plan) were given to the patient.    Follow-up: Return in about 6 months (around 10/04/2016) for hypertension, cholesterol, CHF.  Claretta Fraise, M.D.

## 2016-04-07 LAB — CMP14+EGFR
ALBUMIN: 4.2 g/dL (ref 3.5–4.8)
ALT: 9 IU/L (ref 0–44)
AST: 9 IU/L (ref 0–40)
Albumin/Globulin Ratio: 1.7 (ref 1.2–2.2)
Alkaline Phosphatase: 59 IU/L (ref 39–117)
BILIRUBIN TOTAL: 0.4 mg/dL (ref 0.0–1.2)
BUN / CREAT RATIO: 25 — AB (ref 10–24)
BUN: 26 mg/dL (ref 8–27)
CHLORIDE: 103 mmol/L (ref 96–106)
CO2: 24 mmol/L (ref 18–29)
CREATININE: 1.06 mg/dL (ref 0.76–1.27)
Calcium: 9.5 mg/dL (ref 8.6–10.2)
GFR calc non Af Amer: 71 mL/min/{1.73_m2} (ref >59)
GFR, EST AFRICAN AMERICAN: 82 mL/min/{1.73_m2} (ref >59)
GLOBULIN, TOTAL: 2.5 g/dL (ref 1.5–4.5)
GLUCOSE: 100 mg/dL — AB (ref 65–99)
Potassium: 4.6 mmol/L (ref 3.5–5.2)
SODIUM: 142 mmol/L (ref 134–144)
TOTAL PROTEIN: 6.7 g/dL (ref 6.0–8.5)

## 2016-04-07 LAB — PSA TOTAL (REFLEX TO FREE): PROSTATE SPECIFIC AG, SERUM: 0.6 ng/mL (ref 0.0–4.0)

## 2016-04-07 LAB — CBC WITH DIFFERENTIAL/PLATELET
BASOS ABS: 0 10*3/uL (ref 0.0–0.2)
BASOS: 0 %
EOS (ABSOLUTE): 0.2 10*3/uL (ref 0.0–0.4)
EOS: 3 %
HEMATOCRIT: 40.6 % (ref 37.5–51.0)
HEMOGLOBIN: 13.8 g/dL (ref 12.6–17.7)
IMMATURE GRANS (ABS): 0 10*3/uL (ref 0.0–0.1)
Immature Granulocytes: 0 %
LYMPHS ABS: 1.5 10*3/uL (ref 0.7–3.1)
LYMPHS: 29 %
MCH: 30.7 pg (ref 26.6–33.0)
MCHC: 34 g/dL (ref 31.5–35.7)
MCV: 90 fL (ref 79–97)
MONOCYTES: 11 %
Monocytes Absolute: 0.6 10*3/uL (ref 0.1–0.9)
NEUTROS ABS: 3 10*3/uL (ref 1.4–7.0)
Neutrophils: 57 %
Platelets: 167 10*3/uL (ref 150–379)
RBC: 4.49 x10E6/uL (ref 4.14–5.80)
RDW: 13.9 % (ref 12.3–15.4)
WBC: 5.2 10*3/uL (ref 3.4–10.8)

## 2016-04-07 LAB — LIPID PANEL
Chol/HDL Ratio: 3 ratio units (ref 0.0–5.0)
Cholesterol, Total: 160 mg/dL (ref 100–199)
HDL: 54 mg/dL (ref >39)
LDL CALC: 87 mg/dL (ref 0–99)
Triglycerides: 97 mg/dL (ref 0–149)
VLDL CHOLESTEROL CAL: 19 mg/dL (ref 5–40)

## 2016-04-07 LAB — VITAMIN D 25 HYDROXY (VIT D DEFICIENCY, FRACTURES): Vit D, 25-Hydroxy: 39.3 ng/mL (ref 30.0–100.0)

## 2016-04-11 ENCOUNTER — Other Ambulatory Visit: Payer: Self-pay | Admitting: Family Medicine

## 2016-04-16 ENCOUNTER — Other Ambulatory Visit: Payer: Self-pay | Admitting: Family Medicine

## 2016-04-17 ENCOUNTER — Telehealth: Payer: Self-pay | Admitting: Family Medicine

## 2016-04-17 NOTE — Telephone Encounter (Signed)
Called in.

## 2016-04-19 ENCOUNTER — Other Ambulatory Visit: Payer: Self-pay | Admitting: Family Medicine

## 2016-04-26 ENCOUNTER — Other Ambulatory Visit: Payer: Self-pay | Admitting: Family Medicine

## 2016-05-08 ENCOUNTER — Other Ambulatory Visit: Payer: Self-pay | Admitting: Cardiology

## 2016-05-16 ENCOUNTER — Other Ambulatory Visit: Payer: Self-pay | Admitting: Family Medicine

## 2016-05-18 MED ORDER — TRAMADOL HCL 50 MG PO TABS: ORAL_TABLET | ORAL | 4 refills | Status: DC

## 2016-05-18 NOTE — Telephone Encounter (Signed)
Dr Darlyn Read initially denied the request for Tramadol but after further chart review decided that refills sufficient to last until his 6 month follow up is appropriate. Tramadol #60 with 4 refills called in. Patient aware that this was done and that he needs to keep his previously scheduled 6 month f/u. He is also aware that he will need to pickup a paper prescription at that visit.

## 2016-05-18 NOTE — Addendum Note (Signed)
Addended by: [name] on: 05/18/2016 03:17 PM   Modules accepted: Orders

## 2016-06-25 ENCOUNTER — Other Ambulatory Visit: Payer: Self-pay | Admitting: Family Medicine

## 2016-07-18 ENCOUNTER — Other Ambulatory Visit: Payer: Self-pay | Admitting: Family Medicine

## 2016-07-24 ENCOUNTER — Other Ambulatory Visit: Payer: Self-pay | Admitting: Family Medicine

## 2016-08-02 ENCOUNTER — Encounter: Payer: Self-pay | Admitting: Family Medicine

## 2016-08-02 ENCOUNTER — Ambulatory Visit (INDEPENDENT_AMBULATORY_CARE_PROVIDER_SITE_OTHER): Payer: Medicare Other | Admitting: Family Medicine

## 2016-08-02 VITALS — BP 141/88 | HR 59 | Temp 97.5°F | Ht 68.0 in | Wt 195.0 lb

## 2016-08-02 DIAGNOSIS — J0101 Acute recurrent maxillary sinusitis: Secondary | ICD-10-CM | POA: Diagnosis not present

## 2016-08-02 MED ORDER — AMOXICILLIN-POT CLAVULANATE 875-125 MG PO TABS: 1.0000 | ORAL_TABLET | Freq: Two times a day (BID) | ORAL | 0 refills | Status: DC

## 2016-08-02 NOTE — Progress Notes (Signed)
   HPI  Patient presents today here with concern for sinus infection  Depression explains that over the last 3 weeks these have bilateral maxillary facial pain, dry cough. He states that the facial pain seems to be getting worse and consistent with previous sinus infections he's had. He's tried Claritin and Flonase with marginal improvement and then worsening.  He denies any fever, chills, sweats, difficulty breathing, or difficulty tolerating foods or fluids.  PMH: Smoking status noted ROS: Per HPI  Objective: BP (!) 141/88   Pulse (!) 59   Temp 97.5 F (36.4 C) (Oral)   Ht 5\' 8"  (1.727 m)   Wt 195 lb (88.5 kg)   BMI 29.65 kg/m  Gen: NAD, alert, cooperative with exam HEENT: NCAT, TMs normal bilaterally, right-sided maxillary tenderness to palpation, oropharynx clear, nares with swollen turbinates right greater than left CV: RRR, good S1/S2, no murmur Resp: CTABL, no wheezes, non-labored Ext: No edema, warm Neuro: Alert and oriented, No gross deficits  Assessment and plan:  # Acute recurrent maxillary sinusitis Treat with Augmentin Continue supportive care Follow-up if worsening symptoms or failure to improve as expected. Discussed probiotics.  Meds ordered this encounter  Medications  . amoxicillin-clavulanate (AUGMENTIN) 875-125 MG tablet    Sig: Take 1 tablet by mouth 2 (two) times daily.    Dispense:  20 tablet    Refill:  0    Murtis Sink, MD [facility] 08/02/2016, 3:15 PM

## 2016-08-02 NOTE — Patient Instructions (Signed)
Great to see you!  Finish all antibiotics, Consider eating yogurt once daily while you are taking it.    Sinusitis, Adult Sinusitis is soreness and inflammation of your sinuses. Sinuses are hollow spaces in the bones around your face. They are located:  Around your eyes.  In the middle of your forehead.  Behind your nose.  In your cheekbones. Your sinuses and nasal passages are lined with a stringy fluid (mucus). Mucus normally drains out of your sinuses. When your nasal tissues get inflamed or swollen, the mucus can get trapped or blocked so air cannot flow through your sinuses. This lets bacteria, viruses, and funguses grow, and that leads to infection. Follow these instructions at home: Medicines  Take, use, or apply over-the-counter and prescription medicines only as told by your doctor. These may include nasal sprays.  If you were prescribed an antibiotic medicine, take it as told by your doctor. Do not stop taking the antibiotic even if you start to feel better. Hydrate and Humidify  Drink enough water to keep your pee (urine) clear or pale yellow.  Use a cool mist humidifier to keep the humidity level in your home above 50%.  Breathe in steam for 10-15 minutes, 3-4 times a day or as told by your doctor. You can do this in the bathroom while a hot shower is running.  Try not to spend time in cool or dry air. Rest  Rest as much as possible.  Sleep with your head raised (elevated).  Make sure to get enough sleep each night. General instructions  Put a warm, moist washcloth on your face 3-4 times a day or as told by your doctor. This will help with discomfort.  Wash your hands often with soap and water. If there is no soap and water, use hand sanitizer.  Do not smoke. Avoid being around people who are smoking (secondhand smoke).  Keep all follow-up visits as told by your doctor. This is important. Contact a doctor if:  You have a fever.  Your symptoms get  worse.  Your symptoms do not get better within 10 days. Get help right away if:  You have a very bad headache.  You cannot stop throwing up (vomiting).  You have pain or swelling around your face or eyes.  You have trouble seeing.  You feel confused.  Your neck is stiff.  You have trouble breathing. This information is not intended to replace advice given to you by your health care provider. Make sure you discuss any questions you have with your health care provider. Document Released: 01/02/2008 Document Revised: 03/11/2016 Document Reviewed: 05/11/2015 Elsevier Interactive Patient Education  2017 [employer].

## 2016-08-08 ENCOUNTER — Ambulatory Visit (INDEPENDENT_AMBULATORY_CARE_PROVIDER_SITE_OTHER): Payer: Medicare Other | Admitting: Cardiology

## 2016-08-08 ENCOUNTER — Encounter: Payer: Self-pay | Admitting: Cardiology

## 2016-08-08 VITALS — BP 144/80 | HR 63 | Ht 68.0 in | Wt 194.4 lb

## 2016-08-08 DIAGNOSIS — I10 Essential (primary) hypertension: Secondary | ICD-10-CM | POA: Diagnosis not present

## 2016-08-08 DIAGNOSIS — I5022 Chronic systolic (congestive) heart failure: Secondary | ICD-10-CM

## 2016-08-08 DIAGNOSIS — E782 Mixed hyperlipidemia: Secondary | ICD-10-CM | POA: Diagnosis not present

## 2016-08-08 NOTE — Patient Instructions (Signed)

## 2016-08-08 NOTE — Progress Notes (Signed)
Clinical Summary Taylor Cross is a 71 y.o.male seen today for follow up of the following medical problems.   1. Chronic Systolic/Diastolic heart failure  - 10/2013 echo which showed LVEF 40-45%, mid-inferolateral wall hypokinesis, and grade II diastolic dysfunction.  - repeat echo 08/2014 LVEF 35-40%, basal to mid inferolateral and inferior walls, grade I diastolic dysfunction - completed lexiscan which showed moderate sized moderate to severe intensity inferolateral wall defect and small apical to mid inferior wall defect. Both areas were fixed and consistent with scar.    - no SOB or DOE.No recent LE edema - compliant with meds  2. HL - compliant with statin - 03/2016 TC 160 TG 97 HDL 54 LDL 87  3. HTN - compliant with meds - does not check bp at home.    Past Medical History:  Diagnosis Date  . Combined systolic and diastolic heart failure ([place])   . Hyperlipidemia      Allergies  Allergen Reactions  . Neosporin [Neomycin-Bacitracin Zn-Polymyx] Rash  . Voltaren [Diclofenac Sodium] Other (See Comments)    GI upset     Current Outpatient Prescriptions  Medication Sig Dispense Refill  . amoxicillin-clavulanate (AUGMENTIN) 875-125 MG tablet Take 1 tablet by mouth 2 (two) times daily. 20 tablet 0  . aspirin 81 MG chewable tablet Chew 81 mg by mouth daily.    . carvedilol (COREG) 12.5 MG tablet TAKE 1 & 1/2 TABLETS BY MOUTH TWICE DAILY 180 tablet 2  . Clobetasol Prop Emollient Base (CLOBETASOL PROPIONATE E) 0.05 % emollient cream Apply 1 Tube topically 2 (two) times daily. 60 g 3  . finasteride (PROSCAR) 5 MG tablet Take 1 tablet by mouth daily.    . fluticasone (FLONASE) 50 MCG/ACT nasal spray 2 SPRAYS IN EACH NOSTRIL ONCE A DAY 16 g 3  . gabapentin (NEURONTIN) 300 MG capsule Take 1 capsule (300 mg total) by mouth 2 (two) times daily. 60 capsule 0  . hydroquinone 4 % cream Apply 1 application topically 2 (two) times daily. 28.35 g 2  . lisinopril  (PRINIVIL,ZESTRIL) 2.5 MG tablet TAKE 2 TABLETS ONCE DAILY AS DIRECTED 60 tablet 6  . loratadine (CLARITIN) 10 MG tablet Take 10 mg by mouth at bedtime.    . meloxicam (MOBIC) 15 MG tablet TAKE ONE TABLET BY MOUTH ONE TIME DAILY 30 tablet 0  . mupirocin ointment (BACTROBAN) 2 % Apply 1 application topically 2 (two) times daily. To affected areas 22 g 5  . pravastatin (PRAVACHOL) 40 MG tablet TAKE 1 TABLET ONCE A DAY 30 tablet 4  . sertraline (ZOLOFT) 50 MG tablet TAKE ONE TABLET BY MOUTH ONE TIME DAILY 30 tablet 0  . tamsulosin (FLOMAX) 0.4 MG CAPS capsule Take 2 capsules (0.8 mg total) by mouth daily. 60 capsule 11  . traMADol (ULTRAM) 50 MG tablet TAKE  (1)  TABLET TWICE A DAY. 60 tablet 4  . WELCHOL 625 MG tablet TAKE 2 TABLETS 2 TIMES A DAY WITH MEALS 120 tablet 0   No current facility-administered medications for this visit.      Past Surgical History:  Procedure Laterality Date  . HERNIA REPAIR    . TONSILLECTOMY       Allergies  Allergen Reactions  . Neosporin [Neomycin-Bacitracin Zn-Polymyx] Rash  . Voltaren [Diclofenac Sodium] Other (See Comments)    GI upset      Family History  Problem Relation Age of Onset  . Heart disease Mother   . Diabetes Mother   . COPD Father  Social History Mr. [name] reports that he has never smoked. He has never used smokeless tobacco. Mr. [name] reports that he does not drink alcohol.   Review of Systems CONSTITUTIONAL: No weight loss, fever, chills, weakness or fatigue.  HEENT: Eyes: No visual loss, blurred vision, double vision or yellow sclerae.No hearing loss, sneezing, congestion, runny nose or sore throat.  SKIN: No rash or itching.  CARDIOVASCULAR: per HPI RESPIRATORY: No shortness of breath, cough or sputum.  GASTROINTESTINAL: No anorexia, nausea, vomiting or diarrhea. No abdominal pain or blood.  GENITOURINARY: No burning on urination, no polyuria NEUROLOGICAL: No headache, dizziness, syncope, paralysis, ataxia,  numbness or tingling in the extremities. No change in bowel or bladder control.  MUSCULOSKELETAL: No muscle, back pain, joint pain or stiffness.  LYMPHATICS: No enlarged nodes. No history of splenectomy.  PSYCHIATRIC: No history of depression or anxiety.  ENDOCRINOLOGIC: No reports of sweating, cold or heat intolerance. No polyuria or polydipsia.  Marland Kitchen   Physical Examination Vitals:   08/08/16 1503  BP: (!) 144/80  Pulse: 63   Vitals:   08/08/16 1503  Weight: 194 lb 6.4 oz (88.2 kg)  Height: _0  (1.727 m)    Gen: resting comfortably, no acute distress HEENT: no scleral icterus, pupils equal round and reactive, no palptable cervical adenopathy,  CV: RRR, no m/r/g, no jvd Resp: Clear to auscultation bilaterally GI: abdomen is soft, non-tender, non-distended, normal bowel sounds, no hepatosplenomegaly MSK: extremities are warm, no edema.  Skin: warm, no rash Neuro:  no focal deficits Psych: appropriate affect   Diagnostic Studies 10/2013 Echo  Left ventricle: The cavity size was mildly dilated. Wall thickness was normal. Systolic function was mildly to moderately reduced. The estimated ejection fraction is 40%. Features are consistent with a pseudonormal left ventricular filling pattern, with concomitant abnormal relaxation and increased filling pressure (grade 2 diastolic dysfunction). Doppler parameters are consistent with high ventricular filling pressure. - Regional wall motion abnormality: Mild to moderate hypokinesis of the mid inferolateral myocardium. The remaining walls are mildly hypokinetic. - Aortic valve: Trileaflet; mildly thickened, mildly calcified leaflets. There was no stenosis. - Mitral valve: Mildly thickened leaflets . Mild to moderate regurgitation. - Left atrium: The atrium was moderately dilated. - Tricuspid valve: Mild regurgitation. Inadequate TR jet to accurately assess pulmonary pressures. - Pericardium, extracardiac: There was a left  pleural effusion.   11/11/13 Lexiscan MPI  Gated imaging reveals an EDV of 223, ESV of 153, TID ratio 0.99, and LVEF of 31% with global hypokinesis, most prominent in the apex and inferolateral walls.  IMPRESSION: Intermediate risk Lexiscan Cardiolite. No diagnostic ST segment changes were noted to indicate ischemia, frequent PVCs were seen without sustained arrhythmia. Perfusion imaging is most consistent with scar affecting the inferior wall and inferolateral wall as outlined, no large ischemic zones however. LVEF is calculated at 31% with global hypokinesis that is most prominent at the apex and inferior wall. EDV indicates moderate to severe chamber dilatation. Findings are consistent with probable ischemic cardiomyopathy.   08/2014 echo Study Conclusions  - Left ventricle: The cavity size was mildly dilated. Wall thickness was increased in a pattern of moderate LVH. Systolic function was moderately reduced. The estimated ejection fraction was in the range of 35% to 40%. There is hypokinesis to akinesis of the basal-mid inferolateral and inferior myocardium. Doppler parameters are consistent with abnormal left ventricular relaxation (grade 1 diastolic dysfunction). - Aortic valve: Mildly to moderately calcified annulus. Trileaflet. - Mitral valve: Mildly calcified annulus. There was mild regurgitation. -  Left atrium: The atrium was mildly to moderately dilated. - Right atrium: Central venous pressure (est): 3 mm Hg. - Atrial septum: Possible PFO or secundum ASD with apparent left to right flow noted by color Doppler. Cannot exclude vigorous venous return adjacent to septum. Agitated saline study could be considered for further evaluation. - Tricuspid valve: There was trivial regurgitation. - Pulmonary arteries: PA peak pressure: 17 mm Hg (S). - Pericardium, extracardiac: There was no pericardial effusion.  Recommendations: Moderate LVH with mild LV  chamber dilatation and LVEF approximately 35-40%. Hypokinesis to akinesis of the mid to basal inferolateral/inferior walls noted suggestive of ischemic cardiomyopathy. Grade 1 diastolic dysfunction. Compared to prior study in April 2015, wall motion has improved somewhat and ejection fraction looks to be higher, although graded in similar range last time. Mild to moderate left atrial enlargement. Mild MAC with mild mitral regurgitation. Possible PFO or secundum ASD with apparent left to right flow noted by color Doppler. Cannot exclude vigorous venous return adjacent to septum. Agitated saline study could be considered for further evaluation. Trivial tricuspid regurgitation with PASP normal range.         Assessment and Plan   1.Chronic Systolic/diastolic heart failure - Echo 10/2013. LVEF 40-45%. Repeat echo 08/2014 LVEF 35-40%. Carlton Adam suggests this is an ischemic cardiomyopathy with evidence of prior scar, no active ischemia. He has not been in favor of invasive testing.  - no current symptoms. Medical therapy has been limited due to orthostatic dizziness.   - no change in meds today  2. HTN - - he remains at goal. He wil ontinue current meds  3. HL - continue current statin - lipids are at goal.        Arnoldo Lenis, M.D.

## 2016-08-20 ENCOUNTER — Other Ambulatory Visit: Payer: Self-pay | Admitting: Family Medicine

## 2016-08-22 ENCOUNTER — Other Ambulatory Visit: Payer: Self-pay | Admitting: Family Medicine

## 2016-09-04 ENCOUNTER — Ambulatory Visit (INDEPENDENT_AMBULATORY_CARE_PROVIDER_SITE_OTHER): Payer: Medicare Other | Admitting: Family Medicine

## 2016-09-04 ENCOUNTER — Telehealth: Payer: Self-pay | Admitting: Family Medicine

## 2016-09-04 ENCOUNTER — Encounter: Payer: Self-pay | Admitting: Family Medicine

## 2016-09-04 VITALS — BP 133/82 | HR 69 | Temp 98.0°F | Ht 68.0 in | Wt 192.0 lb

## 2016-09-04 DIAGNOSIS — L03116 Cellulitis of left lower limb: Secondary | ICD-10-CM

## 2016-09-04 DIAGNOSIS — R234 Changes in skin texture: Secondary | ICD-10-CM

## 2016-09-04 DIAGNOSIS — T63331A Toxic effect of venom of brown recluse spider, accidental (unintentional), initial encounter: Secondary | ICD-10-CM | POA: Diagnosis not present

## 2016-09-04 MED ORDER — AMOXICILLIN-POT CLAVULANATE 875-125 MG PO TABS: 1.0000 | ORAL_TABLET | Freq: Two times a day (BID) | ORAL | 0 refills | Status: DC

## 2016-09-04 NOTE — Telephone Encounter (Signed)
Pharmacy got RX

## 2016-09-04 NOTE — Progress Notes (Signed)
Subjective:  Patient ID: Taylor Cross, male    DOB: 1946-06-25  Age: 71 y.o. MRN: [id number]  CC: Cellulitis (pt here today c/o an "infected spot" on his left shin)   HPI Hadrian Yarbrough Kerwood presents for lesion on left calf. Present for 3 weeks. Gradually getting bigger. Two days ago started having redness spread from it. Worsening until now.  No known injury. Moderately painful.   History Buell has a past medical history of Combined systolic and diastolic heart failure ([facility]) and Hyperlipidemia.   He has a past surgical history that includes Tonsillectomy and Hernia repair.   His family history includes COPD in his father; Diabetes in his mother; Heart disease in his mother.He reports that he has never smoked. He has never used smokeless tobacco. He reports that he does not drink alcohol or use drugs.  Current Outpatient Prescriptions on File Prior to Visit  Medication Sig Dispense Refill  . aspirin 81 MG chewable tablet Chew 81 mg by mouth daily.    . carvedilol (COREG) 12.5 MG tablet TAKE 1 & 1/2 TABLETS BY MOUTH TWICE DAILY 180 tablet 2  . Clobetasol Prop Emollient Base (CLOBETASOL PROPIONATE E) 0.05 % emollient cream Apply 1 Tube topically 2 (two) times daily. 60 g 3  . finasteride (PROSCAR) 5 MG tablet Take 1 tablet by mouth daily.    . fluticasone (FLONASE) 50 MCG/ACT nasal spray 2 SPRAYS IN EACH NOSTRIL ONCE A DAY 16 g 3  . gabapentin (NEURONTIN) 300 MG capsule Take 1 capsule (300 mg total) by mouth 2 (two) times daily. 60 capsule 1  . hydroquinone 4 % cream APPLY TO AFFECTED AREAS TWICE A DAY 28.35 g 0  . lisinopril (PRINIVIL,ZESTRIL) 2.5 MG tablet TAKE 2 TABLETS ONCE DAILY AS DIRECTED 60 tablet 6  . loratadine (CLARITIN) 10 MG tablet Take 10 mg by mouth at bedtime.    . meloxicam (MOBIC) 15 MG tablet TAKE ONE TABLET BY MOUTH ONE TIME DAILY 30 tablet 0  . mupirocin ointment (BACTROBAN) 2 % Apply 1 application topically 2 (two) times daily. To affected areas 22 g 5  . pravastatin  (PRAVACHOL) 40 MG tablet TAKE 1 TABLET ONCE A DAY 30 tablet 4  . sertraline (ZOLOFT) 50 MG tablet TAKE ONE TABLET BY MOUTH ONE TIME DAILY 30 tablet 1  . tamsulosin (FLOMAX) 0.4 MG CAPS capsule Take 2 capsules (0.8 mg total) by mouth daily. 60 capsule 11  . traMADol (ULTRAM) 50 MG tablet TAKE  (1)  TABLET TWICE A DAY. 60 tablet 4  . WELCHOL 625 MG tablet TAKE 2 TABLETS 2 TIMES A DAY WITH MEALS 120 tablet 1   No current facility-administered medications on file prior to visit.     ROS Review of Systems  Constitutional: Negative for chills, diaphoresis and fever.  HENT: Negative for rhinorrhea and sore throat.   Respiratory: Negative for cough and shortness of breath.   Cardiovascular: Negative for chest pain.  Gastrointestinal: Negative for abdominal pain.  Musculoskeletal: Negative for arthralgias and myalgias.  Skin: Positive for wound.  Neurological: Negative for weakness and headaches.    Objective:  BP 133/82   Pulse 69   Temp 98 F (36.7 C) (Oral)   Ht 5\' 8"  (1.727 m)   Wt 192 lb (87.1 kg)   BMI 29.19 kg/m   Physical Exam  Constitutional: He appears well-developed and well-nourished.  HENT:  Head: Normocephalic and atraumatic.  Eyes: Pupils are equal, round, and reactive to light.  Cardiovascular: Normal rate  and regular rhythm.   No murmur heard. Pulmonary/Chest: Breath sounds normal.  Skin:  1X3 cm eschar within ulceration at medial left calf. There is 8 cm surrounding erythema.  Vitals reviewed.   Assessment & Plan:   Makena was seen today for cellulitis.  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 Take all of this medication   I have discontinued Mr. Lafever's amoxicillin-clavulanate. I am also having him start on amoxicillin-clavulanate. Additionally, I am having him maintain his aspirin, tamsulosin, finasteride, loratadine, Clobetasol Prop Emollient Base, carvedilol, mupirocin ointment, lisinopril, pravastatin, traMADol, fluticasone, meloxicam, sertraline, hydroquinone, gabapentin, and WELCHOL.  Meds ordered this encounter  Medications  . amoxicillin-clavulanate (AUGMENTIN) 875-125 MG tablet    Sig: Take 1 tablet by mouth 2 (two) times daily. Take all of this medication    Dispense:  20 tablet    Refill:  0     Follow-up: Return if symptoms worsen or fail to improve.  [name], M.D.

## 2016-09-17 ENCOUNTER — Other Ambulatory Visit: Payer: Self-pay | Admitting: Cardiology

## 2016-09-18 ENCOUNTER — Other Ambulatory Visit: Payer: Self-pay | Admitting: Family Medicine

## 2016-09-20 ENCOUNTER — Other Ambulatory Visit: Payer: Self-pay | Admitting: Family Medicine

## 2016-10-05 ENCOUNTER — Ambulatory Visit (INDEPENDENT_AMBULATORY_CARE_PROVIDER_SITE_OTHER): Payer: Medicare Other | Admitting: Family Medicine

## 2016-10-05 ENCOUNTER — Encounter: Payer: Self-pay | Admitting: Family Medicine

## 2016-10-05 VITALS — BP 161/89 | HR 70 | Temp 97.4°F | Ht 68.0 in | Wt 191.0 lb

## 2016-10-05 DIAGNOSIS — M47816 Spondylosis without myelopathy or radiculopathy, lumbar region: Secondary | ICD-10-CM | POA: Diagnosis not present

## 2016-10-05 DIAGNOSIS — M542 Cervicalgia: Secondary | ICD-10-CM

## 2016-10-05 DIAGNOSIS — E782 Mixed hyperlipidemia: Secondary | ICD-10-CM

## 2016-10-05 DIAGNOSIS — I10 Essential (primary) hypertension: Secondary | ICD-10-CM

## 2016-10-05 LAB — LIPID PANEL
CHOL/HDL RATIO: 3.3 ratio (ref 0.0–5.0)
Cholesterol, Total: 154 mg/dL (ref 100–199)
HDL: 47 mg/dL (ref >39)
LDL CALC: 86 mg/dL (ref 0–99)
Triglycerides: 105 mg/dL (ref 0–149)
VLDL CHOLESTEROL CAL: 21 mg/dL (ref 5–40)

## 2016-10-05 LAB — CBC WITH DIFFERENTIAL/PLATELET
Basophils Absolute: 0 10*3/uL (ref 0.0–0.2)
Basos: 0 %
EOS (ABSOLUTE): 0.3 10*3/uL (ref 0.0–0.4)
EOS: 6 %
HEMATOCRIT: 40.2 % (ref 37.5–51.0)
Hemoglobin: 13.6 g/dL (ref 13.0–17.7)
Immature Grans (Abs): 0 10*3/uL (ref 0.0–0.1)
Immature Granulocytes: 0 %
Lymphocytes Absolute: 1.5 10*3/uL (ref 0.7–3.1)
Lymphs: 28 %
MCH: 31 pg (ref 26.6–33.0)
MCHC: 33.8 g/dL (ref 31.5–35.7)
MCV: 92 fL (ref 79–97)
MONOS ABS: 0.6 10*3/uL (ref 0.1–0.9)
Monocytes: 11 %
NEUTROS PCT: 55 %
Neutrophils Absolute: 2.9 10*3/uL (ref 1.4–7.0)
Platelets: 153 10*3/uL (ref 150–379)
RBC: 4.39 x10E6/uL (ref 4.14–5.80)
RDW: 14 % (ref 12.3–15.4)
WBC: 5.3 10*3/uL (ref 3.4–10.8)

## 2016-10-05 LAB — CMP14+EGFR
ALK PHOS: 57 IU/L (ref 39–117)
ALT: 10 IU/L (ref 0–44)
AST: 18 IU/L (ref 0–40)
Albumin/Globulin Ratio: 1.9 (ref 1.2–2.2)
Albumin: 4.3 g/dL (ref 3.5–4.8)
BILIRUBIN TOTAL: 0.5 mg/dL (ref 0.0–1.2)
BUN/Creatinine Ratio: 22 (ref 10–24)
BUN: 22 mg/dL (ref 8–27)
CHLORIDE: 103 mmol/L (ref 96–106)
CO2: 24 mmol/L (ref 18–29)
Calcium: 9.2 mg/dL (ref 8.6–10.2)
Creatinine, Ser: 1.01 mg/dL (ref 0.76–1.27)
GFR calc Af Amer: 87 mL/min/{1.73_m2} (ref >59)
GFR calc non Af Amer: 75 mL/min/{1.73_m2} (ref >59)
GLUCOSE: 101 mg/dL — AB (ref 65–99)
Globulin, Total: 2.3 g/dL (ref 1.5–4.5)
Potassium: 4.8 mmol/L (ref 3.5–5.2)
Sodium: 140 mmol/L (ref 134–144)
Total Protein: 6.6 g/dL (ref 6.0–8.5)

## 2016-10-05 MED ORDER — TRAMADOL HCL 50 MG PO TABS: ORAL_TABLET | ORAL | 5 refills | Status: DC

## 2016-10-05 MED ORDER — CARVEDILOL 25 MG PO TABS: 25.0000 mg | ORAL_TABLET | Freq: Two times a day (BID) | ORAL | 5 refills | Status: DC

## 2016-10-05 NOTE — Progress Notes (Signed)
Subjective:  Patient ID: Taylor Cross, male    DOB: 1945/12/06  Age: 71 y.o. MRN: [id number]  CC: Hyperlipidemia (pt here today for routine follow up on his cholesterol )   HPI Taylor Cross presents for follow-up of elevated cholesterol. Doing well without complaints on current medication. Denies side effects of statin including myalgia and arthralgia and nausea. Also in today for liver function testing. Currently no chest pain, shortness of breath or other cardiovascular related symptoms noted.  Pt. Declined colonoscopy.   follow-up of hypertension. Patient has no history of headache chest pain or shortness of breath or recent cough. Patient also denies symptoms of TIA such as numbness weakness lateralizing. Patient checks  blood pressure at home . Systolic running about 737 recently. Patient denies side effects from his medication. States taking it regularly.  Takes tramadol BIS for neck & back pain. Stable at 2-3/10.  History Taylor Cross has a past medical history of Combined systolic and diastolic heart failure ([place]) and Hyperlipidemia.   He has a past surgical history that includes Tonsillectomy and Hernia repair.   His family history includes COPD in his father; Diabetes in his mother; Heart disease in his mother.He reports that he has never smoked. He has never used smokeless tobacco. He reports that he does not drink alcohol or use drugs.  Current Outpatient Prescriptions on File Prior to Visit  Medication Sig Dispense Refill  . aspirin 81 MG chewable tablet Chew 81 mg by mouth daily.    . Clobetasol Prop Emollient Base (CLOBETASOL PROPIONATE E) 0.05 % emollient cream Apply 1 Tube topically 2 (two) times daily. 60 g 3  . finasteride (PROSCAR) 5 MG tablet Take 1 tablet by mouth daily.    . fluticasone (FLONASE) 50 MCG/ACT nasal spray 2 SPRAYS IN EACH NOSTRIL ONCE A DAY 16 g 3  . gabapentin (NEURONTIN) 300 MG capsule Take 1 capsule (300 mg total) by mouth 2 (two) times daily. 60  capsule 1  . hydroquinone 4 % cream APPLY TO AFFECTED AREAS TWICE A DAY 28.35 g 0  . lisinopril (PRINIVIL,ZESTRIL) 2.5 MG tablet TAKE 2 TABLETS ONCE DAILY AS DIRECTED 60 tablet 6  . loratadine (CLARITIN) 10 MG tablet Take 10 mg by mouth at bedtime.    . meloxicam (MOBIC) 15 MG tablet TAKE ONE TABLET BY MOUTH ONE TIME DAILY 30 tablet 0  . mupirocin ointment (BACTROBAN) 2 % Apply 1 application topically 2 (two) times daily. To affected areas 22 g 5  . pravastatin (PRAVACHOL) 40 MG tablet TAKE 1 TABLET ONCE A DAY 30 tablet 4  . sertraline (ZOLOFT) 50 MG tablet TAKE ONE TABLET BY MOUTH ONE TIME DAILY 30 tablet 1  . tamsulosin (FLOMAX) 0.4 MG CAPS capsule Take 2 capsules (0.8 mg total) by mouth daily. 60 capsule 11  . WELCHOL 625 MG tablet TAKE 2 TABLETS 2 TIMES A DAY WITH MEALS 120 tablet 1   No current facility-administered medications on file prior to visit.     ROS Review of Systems  Constitutional: Negative for chills, diaphoresis and fever.  HENT: Negative for rhinorrhea and sore throat.   Respiratory: Negative for cough and shortness of breath.   Cardiovascular: Negative for chest pain.  Gastrointestinal: Negative for abdominal pain.  Musculoskeletal: Positive for back pain and neck pain (both are chronic. UNchanged. History of bone spurs). Negative for arthralgias and myalgias.  Skin: Negative for rash.  Neurological: Negative for weakness and headaches.    Objective:  BP (!) 161/89  Pulse 70   Temp 97.4 F (36.3 C) (Oral)   Ht 5' 8"  (1.727 m)   Wt 191 lb (86.6 kg)   BMI 29.04 kg/m   BP Readings from Last 3 Encounters:  10/05/16 (!) 161/89  09/04/16 133/82  08/08/16 (!) 144/80    Wt Readings from Last 3 Encounters:  10/05/16 191 lb (86.6 kg)  09/04/16 192 lb (87.1 kg)  08/08/16 194 lb 6.4 oz (88.2 kg)     Physical Exam  Constitutional: He is oriented to person, place, and time. He appears well-developed and well-nourished. No distress.  HENT:  Head:  Normocephalic and atraumatic.  Right Ear: External ear normal.  Left Ear: External ear normal.  Nose: Nose normal.  Mouth/Throat: Oropharynx is clear and moist.  Eyes: Conjunctivae and EOM are normal. Pupils are equal, round, and reactive to light.  Neck: Normal range of motion. Neck supple. No thyromegaly present.  Cardiovascular: Normal rate, regular rhythm and normal heart sounds.   No murmur heard. Pulmonary/Chest: Effort normal and breath sounds normal. No respiratory distress. He has no wheezes. He has no rales.  Abdominal: Soft. Bowel sounds are normal. He exhibits no distension. There is no tenderness.  Lymphadenopathy:    He has no cervical adenopathy.  Neurological: He is alert and oriented to person, place, and time. He has normal reflexes.  Skin: Skin is warm and dry.  Psychiatric: He has a normal mood and affect. His behavior is normal. Judgment and thought content normal.    Lab Results  Component Value Date   HGBA1C 5.6 10/04/2015    Lab Results  Component Value Date   WBC 5.3 10/05/2016   HGB 13.0 (A) 10/06/2013   HCT 40.2 10/05/2016   PLT 153 10/05/2016   GLUCOSE 101 (H) 10/05/2016   CHOL 154 10/05/2016   TRIG 105 10/05/2016   HDL 47 10/05/2016   LDLCALC 86 10/05/2016   ALT 10 10/05/2016   AST 18 10/05/2016   NA 140 10/05/2016   K 4.8 10/05/2016   CL 103 10/05/2016   CREATININE 1.01 10/05/2016   BUN 22 10/05/2016   CO2 24 10/05/2016   TSH 1.910 10/06/2013   HGBA1C 5.6 10/04/2015    No results found.  Assessment & Plan:   Taylor Cross was seen today for hyperlipidemia.  Diagnoses and all orders for this visit:  Mixed hyperlipidemia -     CMP14+EGFR -     Lipid panel -     CBC with Differential/Platelet  Essential hypertension -     CMP14+EGFR -     Lipid panel -     CBC with Differential/Platelet  Cervicalgia  Spondylosis of lumbar region without myelopathy or radiculopathy  Other orders -     carvedilol (COREG) 25 MG tablet; Take 1 tablet  (25 mg total) by mouth 2 (two) times daily with a meal. -     Discontinue: traMADol (ULTRAM) 50 MG tablet; TAKE  (1)  TABLET TWICE A DAY. -     traMADol (ULTRAM) 50 MG tablet; TAKE  (1)  TABLET TWICE A DAY.   I have discontinued Taylor Cross's amoxicillin-clavulanate. I have also changed his carvedilol. Additionally, I am having him maintain his aspirin, tamsulosin, finasteride, loratadine, Clobetasol Prop Emollient Base, mupirocin ointment, lisinopril, pravastatin, fluticasone, sertraline, hydroquinone, gabapentin, WELCHOL, meloxicam, and traMADol.  Meds ordered this encounter  Medications  . carvedilol (COREG) 25 MG tablet    Sig: Take 1 tablet (25 mg total) by mouth 2 (two) times daily with a  meal.    Dispense:  60 tablet    Refill:  5  . DISCONTD: traMADol (ULTRAM) 50 MG tablet    Sig: TAKE  (1)  TABLET TWICE A DAY.    Dispense:  60 tablet    Refill:  5  . traMADol (ULTRAM) 50 MG tablet    Sig: TAKE  (1)  TABLET TWICE A DAY.    Dispense:  60 tablet    Refill:  5     Follow-up: Return in about 4 weeks (around 11/02/2016) for hypertension.  Claretta Fraise, M.D.

## 2016-10-15 ENCOUNTER — Other Ambulatory Visit: Payer: Self-pay | Admitting: Family Medicine

## 2016-10-18 ENCOUNTER — Other Ambulatory Visit: Payer: Self-pay | Admitting: Family Medicine

## 2016-10-22 ENCOUNTER — Other Ambulatory Visit: Payer: Self-pay | Admitting: Family Medicine

## 2016-10-24 ENCOUNTER — Telehealth: Payer: Self-pay | Admitting: Family Medicine

## 2016-10-24 NOTE — Telephone Encounter (Signed)
FYI Patient thought doctor should know he has lost weight, (10 lbs), to help with his blood pressure.  Blood pressure numbers have been 120/65, 118/66, 109/60. He has had on episode of slight dizziness.  He was advised to continue monitoring blood pressure and call if numbers go down or more dizziness.

## 2016-10-31 ENCOUNTER — Ambulatory Visit (INDEPENDENT_AMBULATORY_CARE_PROVIDER_SITE_OTHER): Payer: Medicare Other | Admitting: Family Medicine

## 2016-10-31 ENCOUNTER — Encounter: Payer: Self-pay | Admitting: Family Medicine

## 2016-10-31 VITALS — BP 136/82 | HR 68 | Temp 97.4°F | Ht 68.0 in | Wt 186.0 lb

## 2016-10-31 DIAGNOSIS — R42 Dizziness and giddiness: Secondary | ICD-10-CM

## 2016-10-31 DIAGNOSIS — I10 Essential (primary) hypertension: Secondary | ICD-10-CM | POA: Diagnosis not present

## 2016-10-31 MED ORDER — CARVEDILOL 12.5 MG PO TABS: 12.5000 mg | ORAL_TABLET | Freq: Two times a day (BID) | ORAL | 1 refills | Status: DC

## 2016-10-31 NOTE — Progress Notes (Signed)
Subjective:  Patient ID: Taylor Cross, male    DOB: 1946/05/28  Age: 71 y.o. MRN: [id number]  CC: Blood Pressure Check (pt here today c/o multiple episodes of feeling lightheaded and then his BP was reading 90's/50's and pulse in the 50's.)   HPI Taylor Cross presents for  follow-up of hypertension. Light headed on several occasions each lasting a few moments. One a few days ago wad perstent. Patient has no history of headache chest pain or shortness of breath or recent cough. Patient also denies symptoms of TIA such as numbness weakness lateralizing. Patient checks  blood pressure at home and has not had any elevated readings recently. Patient denies side effects from medication. States taking it regularly.   History Taylor Cross has a past medical history of Combined systolic and diastolic heart failure (HCC) and Hyperlipidemia.   Taylor Cross has a past surgical history that includes Tonsillectomy and Hernia repair.   His family history includes COPD in his father; Diabetes in his mother; Heart disease in his mother.Taylor Cross reports that Taylor Cross has never smoked. Taylor Cross has never used smokeless tobacco. Taylor Cross reports that Taylor Cross does not drink alcohol or use drugs.  Current Outpatient Prescriptions on File Prior to Visit  Medication Sig Dispense Refill  . aspirin 81 MG chewable tablet Chew 81 mg by mouth daily.    . Clobetasol Prop Emollient Base (CLOBETASOL PROPIONATE E) 0.05 % emollient cream Apply 1 Tube topically 2 (two) times daily. 60 g 3  . finasteride (PROSCAR) 5 MG tablet Take 1 tablet by mouth daily.    . fluticasone (FLONASE) 50 MCG/ACT nasal spray 2 SPRAYS IN EACH NOSTRIL ONCE A DAY 16 g 3  . gabapentin (NEURONTIN) 300 MG capsule Take 1 capsule (300 mg total) by mouth 2 (two) times daily. 60 capsule 2  . hydroquinone 4 % cream APPLY TO AFFECTED AREAS TWICE A DAY 28.35 g 0  . lisinopril (PRINIVIL,ZESTRIL) 2.5 MG tablet TAKE 2 TABLETS ONCE DAILY AS DIRECTED 60 tablet 6  . loratadine (CLARITIN) 10 MG tablet Take  10 mg by mouth at bedtime.    . meloxicam (MOBIC) 15 MG tablet TAKE ONE TABLET BY MOUTH ONE TIME DAILY 30 tablet 0  . mupirocin ointment (BACTROBAN) 2 % Apply 1 application topically 2 (two) times daily. To affected areas 22 g 5  . pravastatin (PRAVACHOL) 40 MG tablet TAKE 1 TABLET ONCE A DAY 30 tablet 5  . sertraline (ZOLOFT) 50 MG tablet TAKE ONE TABLET BY MOUTH ONE TIME DAILY 30 tablet 2  . tamsulosin (FLOMAX) 0.4 MG CAPS capsule Take 2 capsules (0.8 mg total) by mouth daily. 60 capsule 11  . traMADol (ULTRAM) 50 MG tablet TAKE  (1)  TABLET TWICE A DAY. 60 tablet 5  . WELCHOL 625 MG tablet TAKE 2 TABLETS 2 TIMES A DAY WITH MEALS 120 tablet 5   No current facility-administered medications on file prior to visit.     ROS Review of Systems  Constitutional: Negative for chills, diaphoresis, fever and unexpected weight change.  HENT: Negative for congestion, hearing loss, rhinorrhea and sore throat.   Eyes: Negative for visual disturbance.  Respiratory: Negative for cough and shortness of breath.   Cardiovascular: Negative for chest pain.  Gastrointestinal: Negative for abdominal pain, constipation and diarrhea.  Genitourinary: Negative for dysuria and flank pain.  Musculoskeletal: Negative for arthralgias and joint swelling.  Skin: Negative for rash.  Neurological: Positive for dizziness and light-headedness. Negative for headaches.  Psychiatric/Behavioral: Negative for dysphoric mood and  sleep disturbance.    Objective:  BP 136/82   Pulse 68   Temp 97.4 F (36.3 C) (Oral)   Ht 5\' 8"  (1.727 m)   Wt 186 lb (84.4 kg)   BMI 28.28 kg/m   BP Readings from Last 3 Encounters:  10/31/16 136/82  10/05/16 (!) 161/89  09/04/16 133/82    Wt Readings from Last 3 Encounters:  10/31/16 186 lb (84.4 kg)  10/05/16 191 lb (86.6 kg)  09/04/16 192 lb (87.1 kg)     Physical Exam  Constitutional: Taylor Cross appears well-developed and well-nourished.  HENT:  Head: Normocephalic and atraumatic.    Right Ear: Tympanic membrane and external ear normal. No decreased hearing is noted.  Left Ear: Tympanic membrane and external ear normal. No decreased hearing is noted.  Mouth/Throat: No oropharyngeal exudate or posterior oropharyngeal erythema.  Eyes: Pupils are equal, round, and reactive to light.  Neck: Normal range of motion. Neck supple.  Cardiovascular: Normal rate and regular rhythm.   No murmur heard. Pulmonary/Chest: Breath sounds normal. No respiratory distress.  Abdominal: Soft. Bowel sounds are normal. Taylor Cross exhibits no mass. There is no tenderness.  Vitals reviewed.    Lab Results  Component Value Date   WBC 5.3 10/05/2016   HGB 13.0 (A) 10/06/2013   HCT 40.2 10/05/2016   PLT 153 10/05/2016   GLUCOSE 101 (H) 10/05/2016   CHOL 154 10/05/2016   TRIG 105 10/05/2016   HDL 47 10/05/2016   LDLCALC 86 10/05/2016   ALT 10 10/05/2016   AST 18 10/05/2016   NA 140 10/05/2016   K 4.8 10/05/2016   CL 103 10/05/2016   CREATININE 1.01 10/05/2016   BUN 22 10/05/2016   CO2 24 10/05/2016   TSH 1.910 10/06/2013   HGBA1C 5.6 10/04/2015    No results found.  Assessment & Plan:   Taylor Cross was seen today for blood pressure check.  Diagnoses and all orders for this visit:  Essential hypertension  Dizziness  Other orders -     carvedilol (COREG) 12.5 MG tablet; Take 1 tablet (12.5 mg total) by mouth 2 (two) times daily with a meal.   I have changed [name]'s carvedilol. I am also having him maintain his aspirin, tamsulosin, finasteride, loratadine, Clobetasol Prop Emollient Base, mupirocin ointment, lisinopril, fluticasone, hydroquinone, meloxicam, traMADol, pravastatin, sertraline, gabapentin, and WELCHOL.  Meds ordered this encounter  Medications  . carvedilol (COREG) 12.5 MG tablet    Sig: Take 1 tablet (12.5 mg total) by mouth 2 (two) times daily with a meal.    Dispense:  180 tablet    Refill:  1    Report any continued sx. Should resolve promptly with reduction  of medication.   Follow-up: Return in about 3 months (around 01/30/2017).  [name], M.D.

## 2016-11-19 ENCOUNTER — Other Ambulatory Visit: Payer: Self-pay | Admitting: Family Medicine

## 2016-11-26 ENCOUNTER — Other Ambulatory Visit: Payer: Self-pay | Admitting: Family Medicine

## 2016-12-03 ENCOUNTER — Other Ambulatory Visit: Payer: Self-pay | Admitting: Family Medicine

## 2016-12-04 ENCOUNTER — Other Ambulatory Visit: Payer: Self-pay | Admitting: Cardiology

## 2016-12-18 ENCOUNTER — Other Ambulatory Visit: Payer: Self-pay | Admitting: Family Medicine

## 2017-01-03 ENCOUNTER — Other Ambulatory Visit: Payer: Self-pay | Admitting: Cardiology

## 2017-01-09 ENCOUNTER — Ambulatory Visit (INDEPENDENT_AMBULATORY_CARE_PROVIDER_SITE_OTHER): Payer: Medicare Other | Admitting: Family Medicine

## 2017-01-09 ENCOUNTER — Encounter: Payer: Self-pay | Admitting: Family Medicine

## 2017-01-09 VITALS — BP 137/78 | HR 68 | Temp 97.0°F | Ht 68.0 in | Wt 177.0 lb

## 2017-01-09 DIAGNOSIS — J438 Other emphysema: Secondary | ICD-10-CM | POA: Diagnosis not present

## 2017-01-09 DIAGNOSIS — I10 Essential (primary) hypertension: Secondary | ICD-10-CM

## 2017-01-09 DIAGNOSIS — I5189 Other ill-defined heart diseases: Secondary | ICD-10-CM

## 2017-01-09 DIAGNOSIS — I519 Heart disease, unspecified: Secondary | ICD-10-CM

## 2017-01-09 NOTE — Progress Notes (Signed)
Subjective:  Patient ID: Taylor Cross, male    DOB: 1946-04-06  Age: 71 y.o. MRN: [id number]  CC: Hypertension (pt here today for routine follow up of his chronic medical conditions)   HPI NEMO SUDBURY presents for  follow-up of hypertension. Patient has no history of headache chest pain or shortness of breath or recent cough. Patient also denies symptoms of TIA such as numbness weakness lateralizing. Patient checks  blood pressure at home and has not had any elevated readings recently. Patient denies side effects from medication. States taking it regularly.   History Lexon has a past medical history of Combined systolic and diastolic heart failure (HCC) and Hyperlipidemia.   He has a past surgical history that includes Tonsillectomy and Hernia repair.   His family history includes COPD in his father; Diabetes in his mother; Heart disease in his mother.He reports that he has never smoked. He has never used smokeless tobacco. He reports that he does not drink alcohol or use drugs.  Current Outpatient Prescriptions on File Prior to Visit  Medication Sig Dispense Refill  . aspirin 81 MG chewable tablet Chew 81 mg by mouth daily.    . carvedilol (COREG) 12.5 MG tablet Take 1 tablet (12.5 mg total) by mouth 2 (two) times daily with a meal. 180 tablet 1  . Clobetasol Prop Emollient Base (CLOBETASOL PROPIONATE E) 0.05 % emollient cream Apply 1 Tube topically 2 (two) times daily. 60 g 3  . finasteride (PROSCAR) 5 MG tablet Take 1 tablet by mouth daily.    . fluticasone (FLONASE) 50 MCG/ACT nasal spray 2 SPRAYS IN EACH NOSTRIL ONCE A DAY 16 g 5  . gabapentin (NEURONTIN) 300 MG capsule Take 1 capsule (300 mg total) by mouth 2 (two) times daily. 60 capsule 2  . hydroquinone 4 % cream APPLY TO AFFECTED AREAS TWICE A DAY 28.35 g 2  . lisinopril (PRINIVIL,ZESTRIL) 2.5 MG tablet TAKE 2 TABLETS ONCE DAILY AS DIRECTED 60 tablet 3  . loratadine (CLARITIN) 10 MG tablet Take 10 mg by mouth at bedtime.      . meloxicam (MOBIC) 15 MG tablet TAKE ONE TABLET BY MOUTH ONE TIME DAILY 30 tablet 0  . mupirocin ointment (BACTROBAN) 2 % Apply 1 application topically 2 (two) times daily. To affected areas 22 g 5  . pravastatin (PRAVACHOL) 40 MG tablet TAKE 1 TABLET ONCE A DAY 30 tablet 5  . sertraline (ZOLOFT) 50 MG tablet TAKE ONE TABLET BY MOUTH ONE TIME DAILY 30 tablet 2  . tamsulosin (FLOMAX) 0.4 MG CAPS capsule Take 2 capsules (0.8 mg total) by mouth daily. 60 capsule 11  . traMADol (ULTRAM) 50 MG tablet TAKE  (1)  TABLET TWICE A DAY. 60 tablet 5  . WELCHOL 625 MG tablet TAKE 2 TABLETS 2 TIMES A DAY WITH MEALS 120 tablet 5   No current facility-administered medications on file prior to visit.     ROS Review of Systems  Constitutional: Negative for chills, diaphoresis and fever.  HENT: Negative for rhinorrhea and sore throat.   Respiratory: Negative for cough and shortness of breath.   Cardiovascular: Negative for chest pain.  Gastrointestinal: Negative for abdominal pain.  Musculoskeletal: Negative for arthralgias and myalgias.  Skin: Negative for rash.  Neurological: Negative for weakness and headaches.    Objective:  BP 137/78   Pulse 68   Temp 97 F (36.1 C) (Oral)   Ht 5\' 8"  (1.727 m)   Wt 177 lb (80.3 kg)   BMI  26.91 kg/m   BP Readings from Last 3 Encounters:  01/09/17 137/78  10/31/16 136/82  10/05/16 (!) 161/89    Wt Readings from Last 3 Encounters:  01/09/17 177 lb (80.3 kg)  10/31/16 186 lb (84.4 kg)  10/05/16 191 lb (86.6 kg)     Physical Exam  Constitutional: He appears well-developed and well-nourished.  HENT:  Head: Normocephalic and atraumatic.  Right Ear: Tympanic membrane and external ear normal. No decreased hearing is noted.  Left Ear: Tympanic membrane and external ear normal. No decreased hearing is noted.  Mouth/Throat: No oropharyngeal exudate or posterior oropharyngeal erythema.  Eyes: Pupils are equal, round, and reactive to light.  Neck: Normal  range of motion. Neck supple.  Cardiovascular: Normal rate and regular rhythm.   No murmur heard. Pulmonary/Chest: Breath sounds normal. No respiratory distress.  Abdominal: Soft. Bowel sounds are normal. He exhibits no mass. There is no tenderness.  Vitals reviewed.    Lab Results  Component Value Date   WBC 5.3 10/05/2016   HGB 13.6 10/05/2016   HCT 40.2 10/05/2016   PLT 153 10/05/2016   GLUCOSE 101 (H) 10/05/2016   CHOL 154 10/05/2016   TRIG 105 10/05/2016   HDL 47 10/05/2016   LDLCALC 86 10/05/2016   ALT 10 10/05/2016   AST 18 10/05/2016   NA 140 10/05/2016   K 4.8 10/05/2016   CL 103 10/05/2016   CREATININE 1.01 10/05/2016   BUN 22 10/05/2016   CO2 24 10/05/2016   TSH 1.910 10/06/2013   HGBA1C 5.6 10/04/2015    No results found.  Assessment & Plan:   Marsh was seen today for hypertension.  Diagnoses and all orders for this visit:  Essential hypertension  Systolic dysfunction  Diastolic dysfunction  Other emphysema (HCC)   I am having Mr. Konicki maintain his aspirin, tamsulosin, finasteride, loratadine, Clobetasol Prop Emollient Base, mupirocin ointment, traMADol, pravastatin, sertraline, gabapentin, WELCHOL, carvedilol, fluticasone, hydroquinone, meloxicam, and lisinopril.  No orders of the defined types were placed in this encounter.     Follow-up: Return in about 6 months (around 07/11/2017).  [name], M.D.

## 2017-01-16 ENCOUNTER — Other Ambulatory Visit: Payer: Self-pay | Admitting: Family Medicine

## 2017-01-17 ENCOUNTER — Other Ambulatory Visit: Payer: Self-pay | Admitting: Family Medicine

## 2017-02-12 ENCOUNTER — Ambulatory Visit (INDEPENDENT_AMBULATORY_CARE_PROVIDER_SITE_OTHER): Payer: Medicare Other | Admitting: Cardiology

## 2017-02-12 ENCOUNTER — Encounter: Payer: Self-pay | Admitting: Cardiology

## 2017-02-12 VITALS — BP 128/73 | HR 62 | Ht 68.0 in | Wt 177.0 lb

## 2017-02-12 DIAGNOSIS — I10 Essential (primary) hypertension: Secondary | ICD-10-CM | POA: Diagnosis not present

## 2017-02-12 DIAGNOSIS — I5022 Chronic systolic (congestive) heart failure: Secondary | ICD-10-CM | POA: Diagnosis not present

## 2017-02-12 DIAGNOSIS — E782 Mixed hyperlipidemia: Secondary | ICD-10-CM | POA: Diagnosis not present

## 2017-02-12 NOTE — Patient Instructions (Signed)

## 2017-02-12 NOTE — Progress Notes (Signed)
Clinical Summary Taylor Cross is a 71 y.o.male seen today for follow up of the following medical problems.   1. Chronic Systolic/Diastolic heart failure  - 10/2013 echo which showed LVEF 40-45%, mid-inferolateral wall hypokinesis, and grade II diastolic dysfunction.  - repeat echo 08/2014 LVEF 35-40%, basal to mid inferolateral and inferior walls, grade I diastolic dysfunction - completed lexiscan which showed moderate sized moderate to severe intensity inferolateral wall defect and small apical to mid inferior wall defect. Both areas were fixed and consistent with scar.     - coreg changed by pcp due to dizzienss, from 18.9m bid to 12.563mbid. Symptoms improved.  - no recent SOB, DOE or LE edema  2. HL - compliant with statin - 09/2016 TC 154 TG 105 HDL 47 LDL 86 - started on welchol by pcp in past.      3. HTN - compliant with meds       Past Medical History:  Diagnosis Date  . Combined systolic and diastolic heart failure ([facility]  . Hyperlipidemia      Allergies  Allergen Reactions  . Neosporin [Neomycin-Bacitracin Zn-Polymyx] Rash  . Voltaren [Diclofenac Sodium] Other (See Comments)    GI upset     Current Outpatient Prescriptions  Medication Sig Dispense Refill  . aspirin 81 MG chewable tablet Chew 81 mg by mouth daily.    . carvedilol (COREG) 12.5 MG tablet Take 1 tablet (12.5 mg total) by mouth 2 (two) times daily with a meal. 180 tablet 1  . Clobetasol Prop Emollient Base (CLOBETASOL PROPIONATE E) 0.05 % emollient cream Apply 1 Tube topically 2 (two) times daily. 60 g 3  . finasteride (PROSCAR) 5 MG tablet Take 1 tablet by mouth daily.    . fluticasone (FLONASE) 50 MCG/ACT nasal spray 2 SPRAYS IN EACH NOSTRIL ONCE A DAY 16 g 5  . gabapentin (NEURONTIN) 300 MG capsule Take 1 capsule (300 mg total) by mouth 2 (two) times daily. 60 capsule 0  . hydroquinone 4 % cream APPLY TO AFFECTED AREAS TWICE A DAY 28.35 g 2  . lisinopril (PRINIVIL,ZESTRIL) 2.5 MG  tablet TAKE 2 TABLETS ONCE DAILY AS DIRECTED 60 tablet 3  . loratadine (CLARITIN) 10 MG tablet Take 10 mg by mouth at bedtime.    . meloxicam (MOBIC) 15 MG tablet TAKE ONE TABLET BY MOUTH ONE TIME DAILY 30 tablet 0  . mupirocin ointment (BACTROBAN) 2 % Apply 1 application topically 2 (two) times daily. To affected areas 22 g 5  . pravastatin (PRAVACHOL) 40 MG tablet TAKE 1 TABLET ONCE A DAY 30 tablet 5  . sertraline (ZOLOFT) 50 MG tablet TAKE ONE TABLET BY MOUTH ONE TIME DAILY 30 tablet 2  . tamsulosin (FLOMAX) 0.4 MG CAPS capsule Take 2 capsules (0.8 mg total) by mouth daily. 60 capsule 11  . traMADol (ULTRAM) 50 MG tablet TAKE  (1)  TABLET TWICE A DAY. 60 tablet 5  . WELCHOL 625 MG tablet TAKE 2 TABLETS 2 TIMES A DAY WITH MEALS 120 tablet 5   No current facility-administered medications for this visit.      Past Surgical History:  Procedure Laterality Date  . HERNIA REPAIR    . TONSILLECTOMY       Allergies  Allergen Reactions  . Neosporin [Neomycin-Bacitracin Zn-Polymyx] Rash  . Voltaren [Diclofenac Sodium] Other (See Comments)    GI upset      Family History  Problem Relation Age of Onset  . Heart disease Mother   .  Diabetes Mother   . COPD Father      Social History Taylor Cross reports that he has never smoked. He has never used smokeless tobacco. Taylor Cross reports that he does not drink alcohol.   Review of Systems CONSTITUTIONAL: No weight loss, fever, chills, weakness or fatigue.  HEENT: Eyes: No visual loss, blurred vision, double vision or yellow sclerae.No hearing loss, sneezing, congestion, runny nose or sore throat.  SKIN: No rash or itching.  CARDIOVASCULAR: per hpi RESPIRATORY: No shortness of breath, cough or sputum.  GASTROINTESTINAL: No anorexia, nausea, vomiting or diarrhea. No abdominal pain or blood.  GENITOURINARY: No burning on urination, no polyuria NEUROLOGICAL: No headache, dizziness, syncope, paralysis, ataxia, numbness or tingling in the  extremities. No change in bowel or bladder control.  MUSCULOSKELETAL: No muscle, back pain, joint pain or stiffness.  LYMPHATICS: No enlarged nodes. No history of splenectomy.  PSYCHIATRIC: No history of depression or anxiety.  ENDOCRINOLOGIC: No reports of sweating, cold or heat intolerance. No polyuria or polydipsia.  Marland Kitchen   Physical Examination Vitals:   02/12/17 1412  BP: 128/73  Pulse: 62   Vitals:   02/12/17 1412  Weight: 177 lb (80.3 kg)  Height: _0  (1.727 m)    Gen: resting comfortably, no acute distress HEENT: no scleral icterus, pupils equal round and reactive, no palptable cervical adenopathy,  CV: RRR, no m/r/g, n ojvd Resp: Clear to auscultation bilaterally GI: abdomen is soft, non-tender, non-distended, normal bowel sounds, no hepatosplenomegaly MSK: extremities are warm, no edema.  Skin: warm, no rash Neuro:  no focal deficits Psych: appropriate affect   Diagnostic Studies 10/2013 Echo Left ventricle: The cavity size was mildly dilated. Wall thickness was normal. Systolic function was mildly to moderately reduced. The estimated ejection fraction is 40%. Features are consistent with a pseudonormal left ventricular filling pattern, with concomitant abnormal relaxation and increased filling pressure (grade 2 diastolic dysfunction). Doppler parameters are consistent with high ventricular filling pressure. - Regional wall motion abnormality: Mild to moderate hypokinesis of the mid inferolateral myocardium. The remaining walls are mildly hypokinetic. - Aortic valve: Trileaflet; mildly thickened, mildly calcified leaflets. There was no stenosis. - Mitral valve: Mildly thickened leaflets . Mild to moderate regurgitation. - Left atrium: The atrium was moderately dilated. - Tricuspid valve: Mild regurgitation. Inadequate TR jet to accurately assess pulmonary pressures. - Pericardium, extracardiac: There was a left pleural effusion.  11/11/13 Lexiscan  MPI Gated imaging reveals an EDV of 223, ESV of 153, TID ratio 0.99, and LVEF of 31% with global hypokinesis, most prominent in the apex and inferolateral walls.  IMPRESSION: Intermediate risk Lexiscan Cardiolite. No diagnostic ST segment changes were noted to indicate ischemia, frequent PVCs were seen without sustained arrhythmia. Perfusion imaging is most consistent with scar affecting the inferior wall and inferolateral wall as outlined, no large ischemic zones however. LVEF is calculated at 31% with global hypokinesis that is most prominent at the apex and inferior wall. EDV indicates moderate to severe chamber dilatation. Findings are consistent with probable ischemic cardiomyopathy.   08/2014 echo Study Conclusions  - Left ventricle: The cavity size was mildly dilated. Wall thickness was increased in a pattern of moderate LVH. Systolic function was moderately reduced. The estimated ejection fraction was in the range of 35% to 40%. There is hypokinesis to akinesis of the basal-mid inferolateral and inferior myocardium. Doppler parameters are consistent with abnormal left ventricular relaxation (grade 1 diastolic dysfunction). - Aortic valve: Mildly to moderately calcified annulus. Trileaflet. - Mitral valve: Mildly  calcified annulus. There was mild regurgitation. - Left atrium: The atrium was mildly to moderately dilated. - Right atrium: Central venous pressure (est): 3 mm Hg. - Atrial septum: Possible PFO or secundum ASD with apparent left to right flow noted by color Doppler. Cannot exclude vigorous venous return adjacent to septum. Agitated saline study could be considered for further evaluation. - Tricuspid valve: There was trivial regurgitation. - Pulmonary arteries: PA peak pressure: 17 mm Hg (S). - Pericardium, extracardiac: There was no pericardial effusion.  Recommendations: Moderate LVH with mild LV chamber dilatation and LVEF  approximately 35-40%. Hypokinesis to akinesis of the mid to basal inferolateral/inferior walls noted suggestive of ischemic cardiomyopathy. Grade 1 diastolic dysfunction. Compared to prior study in April 2015, wall motion has improved somewhat and ejection fraction looks to be higher, although graded in similar range last time. Mild to moderate left atrial enlargement. Mild MAC with mild mitral regurgitation. Possible PFO or secundum ASD with apparent left to right flow noted by color Doppler. Cannot exclude vigorous venous return adjacent to septum. Agitated saline study could be considered for further evaluation. Trivial tricuspid regurgitation with PASP normal range.       Assessment and Plan   1.Chronic Systolic/diastolic heart failure -Medical therapy has been limited due to orthostatic dizziness.  - no recent symptoms - continue current meds   2. HTN - at goal, continue current meds  3. HL - continue statin - discuss discontinuing welchol with pcp.        Arnoldo Lenis, M.D.

## 2017-02-18 ENCOUNTER — Telehealth: Payer: Self-pay

## 2017-02-18 ENCOUNTER — Other Ambulatory Visit: Payer: Self-pay | Admitting: Family Medicine

## 2017-02-18 NOTE — Telephone Encounter (Signed)
Patient made aware.

## 2017-02-18 NOTE — Telephone Encounter (Signed)
-----   Message from Antoine Poche, MD sent at 02/18/2017 11:28 AM EDT ----- Please let patient know I touched base with Dr Darlyn Read, and we are going to stop his welchol  Dominga Ferry MD ----- Message ----- From: [name], MD Sent: 02/17/2017   8:49 PM To: Antoine Poche, MD  That sounds like a reasonable plan. Thanks for the input. Do you want to tell him or should I?  Best regards, [name] M.D. ----- Message ----- From: Antoine Poche, MD Sent: 02/16/2017   1:56 PM To: Antoine Poche, MD, [name], MD  Dr Darlyn Read,  Mutual patient of ours. Wanted to see how you felt about stopping his welchol. He seems to be fairly well controlled on prava 40, if neccesary could increase prava to 80mg  as higher dose statin for him would have more potential benefits in the long run.    Dina Rich

## 2017-02-18 NOTE — Telephone Encounter (Signed)
-----   Message from Jonathan F Branch, MD sent at 02/18/2017 11:28 AM EDT ----- Please let patient know I touched base with Dr [name], and we are going to stop his welchol  J Branch MD ----- Message ----- From: [name], Warren, MD Sent: 02/17/2017   8:49 PM To: Jonathan F Branch, MD  That sounds like a reasonable plan. Thanks for the input. Do you want to tell him or should I?  Best regards, Warren [name] M.D. ----- Message ----- From: Branch, Jonathan F, MD Sent: 02/16/2017   1:56 PM To: Jonathan F Branch, MD, Warren [name], MD  Dr [name],  Mutual patient of ours. Wanted to see how you felt about stopping his welchol. He seems to be fairly well controlled on prava 40, if neccesary could increase prava to 80mg as higher dose statin for him would have more potential benefits in the long run.    Jonathan Branch   

## 2017-03-20 ENCOUNTER — Other Ambulatory Visit: Payer: Self-pay | Admitting: Family Medicine

## 2017-04-08 ENCOUNTER — Other Ambulatory Visit: Payer: Self-pay | Admitting: Family Medicine

## 2017-04-18 ENCOUNTER — Other Ambulatory Visit: Payer: Self-pay | Admitting: Family Medicine

## 2017-04-18 NOTE — Telephone Encounter (Signed)
Patient last seen 01/09/2017. Please review tramadol

## 2017-05-01 ENCOUNTER — Other Ambulatory Visit: Payer: Self-pay | Admitting: Cardiology

## 2017-05-15 ENCOUNTER — Other Ambulatory Visit: Payer: Self-pay | Admitting: Family Medicine

## 2017-05-16 NOTE — Telephone Encounter (Signed)
lmtcb

## 2017-05-16 NOTE — Telephone Encounter (Signed)
Spoke with Taylor Cross and advised Taylor Cross has to be seen for refills on Tramadol due to new office policy/protocol. Taylor Cross given appt with Dr Darlyn Read 10/22 at 4:25.

## 2017-05-20 ENCOUNTER — Ambulatory Visit (INDEPENDENT_AMBULATORY_CARE_PROVIDER_SITE_OTHER): Payer: Medicare Other | Admitting: Family Medicine

## 2017-05-20 ENCOUNTER — Encounter: Payer: Self-pay | Admitting: Family Medicine

## 2017-05-20 VITALS — BP 134/74 | HR 80 | Temp 96.9°F | Ht 68.0 in | Wt 180.0 lb

## 2017-05-20 DIAGNOSIS — M47816 Spondylosis without myelopathy or radiculopathy, lumbar region: Secondary | ICD-10-CM

## 2017-05-20 MED ORDER — PANTOPRAZOLE SODIUM 40 MG PO TBEC: 40.0000 mg | DELAYED_RELEASE_TABLET | Freq: Every day | ORAL | 11 refills | Status: DC

## 2017-05-20 MED ORDER — TRAMADOL HCL 50 MG PO TABS: 50.0000 mg | ORAL_TABLET | Freq: Two times a day (BID) | ORAL | 5 refills | Status: DC

## 2017-05-20 NOTE — Progress Notes (Signed)
Subjective:  Patient ID: Taylor Cross, male    DOB: Jan 24, 1946  Age: 71 y.o. MRN: [id number]  CC: Medication Refill (pt here today for refills on his tramadol)   HPI Taylor Cross presents for Pain assessment: Cause of pain- lumbar spondylosis Pain location- lumbar Pain on scale of 1-10- 5-6 Frequency-daily What increases pain-laying down What makes pain Better-medicine, heat Effects on ADL - minimal Any change in general medical condition-none  Non opiate measured tried- gabapentin Current medications- gabapentin, tramadol Effectiveness of current meds-adequate Adverse reactions form pain meds-denied  Pill count performed-No Urine drug screen- YesPain agreement reviewed and signed- Yes Reviewed [facility] report- Yes   SOAPP  0= never  1= seldom  2=sometimes  3= often  4= very often  How often do you have mood swings? 0 How often do you smoke a cigarette within an hour after waling up? 0 How often have you taken medication other than the way that it was prescribed?0 How often have you used illegal drugs in the past 5 years? 0 How often, in your lifetime, have you had legal problems or been arrested? 0  Score 0  Alcohol Audit - How often during the last year have found that you: 0-Never   1- Less than monthly   2- Monthly     3-Weekly     4-daily or almost daily  - found that you were not able to stop drinking once you started- 0 -failed to do what was normally expected of you because of drinking- 0 -needed a first drink in the morning- 0 -had a feeling of guilt or remorse after drinking- 0 -are/were unable to remember what happened the night before because of your drinking- 0  0- NO   2- yes but not in last year  4- yes during last year -Have you or someone else been injured because of your drinking- 0 - Has anyone been concerned about your drinking or suggested you cut down- 0        TOTAL- 0  ( 0-7- alcohol education, 8-15- simple advice, 16-19 simple advice  plus counseling, 20-40 referral for evaluation and treatment 0     [facility] 05/20/2017 01/09/2017 10/31/2016  Decreased Interest 0 0 0  Down, Depressed, Hopeless 0 0 0  PHQ - 2 Score 0 0 0  Altered sleeping - - -  Tired, decreased energy - - -  Change in appetite - - -  Feeling bad or failure about yourself  - - -  Trouble concentrating - - -  Moving slowly or fidgety/restless - - -  Suicidal thoughts - - -  PHQ-9 Score - - -    History Taylor Cross has a past medical history of Combined systolic and diastolic heart failure (HCC) and Hyperlipidemia.   He has a past surgical history that includes Tonsillectomy and Hernia repair.   His family history includes COPD in his father; Diabetes in his mother; Heart disease in his mother.He reports that he has never smoked. He has never used smokeless tobacco. He reports that he does not drink alcohol or use drugs.    ROS Review of Systems  Constitutional: Negative for chills, diaphoresis and fever.  HENT: Negative for rhinorrhea and sore throat.   Respiratory: Negative for cough and shortness of breath.   Cardiovascular: Negative for chest pain.  Gastrointestinal: Positive for abdominal pain.  Musculoskeletal: Positive for arthralgias, back pain, myalgias and neck pain.  Skin: Negative for rash.  Neurological: Negative  for weakness and headaches.    Objective:  BP 134/74   Pulse 80   Temp (!) 96.9 F (36.1 C) (Oral)   Ht 5\' 8"  (1.727 m)   Wt 180 lb (81.6 kg)   BMI 27.37 kg/m   BP Readings from Last 3 Encounters:  05/20/17 134/74  02/12/17 128/73  01/09/17 137/78    Wt Readings from Last 3 Encounters:  05/20/17 180 lb (81.6 kg)  02/12/17 177 lb (80.3 kg)  01/09/17 177 lb (80.3 kg)     Physical Exam  Constitutional: He is oriented to person, place, and time. He appears well-developed and well-nourished. No distress.  HENT:  Head: Normocephalic and atraumatic.  Right Ear: External ear normal.  Left Ear:  External ear normal.  Nose: Nose normal.  Mouth/Throat: Oropharynx is clear and moist.  Eyes: Pupils are equal, round, and reactive to light. Conjunctivae and EOM are normal.  Neck: Normal range of motion. Neck supple. No thyromegaly present.  Cardiovascular: Normal rate, regular rhythm and normal heart sounds.   No murmur heard. Pulmonary/Chest: Effort normal and breath sounds normal. No respiratory distress. He has no wheezes. He has no rales.  Abdominal: Soft. Bowel sounds are normal. He exhibits no distension. There is tenderness (mild epigastric).  Lymphadenopathy:    He has no cervical adenopathy.  Neurological: He is alert and oriented to person, place, and time. He has normal reflexes.  Skin: Skin is warm and dry.  Psychiatric: He has a normal mood and affect. His behavior is normal. Judgment and thought content normal.      Assessment & Plan:   Taylor Cross was seen today for medication refill.  Diagnoses and all orders for this visit:  Spondylosis of lumbar region without myelopathy or radiculopathy -     ToxASSURE Select 13 (MW), Urine  Other orders -     traMADol (ULTRAM) 50 MG tablet; Take 1 tablet (50 mg total) by mouth 2 (two) times daily. For back pain -     pantoprazole (PROTONIX) 40 MG tablet; Take 1 tablet (40 mg total) by mouth daily. For stomach       I have changed Taylor Cross's traMADol. I am also having him start on pantoprazole. Additionally, I am having him maintain his aspirin, tamsulosin, finasteride, loratadine, Clobetasol Prop Emollient Base, mupirocin ointment, carvedilol, fluticasone, hydroquinone, gabapentin, lisinopril, meloxicam, sertraline, and pravastatin.  Allergies as of 05/20/2017      Reactions   Neosporin [neomycin-bacitracin Zn-polymyx] Rash   Voltaren [diclofenac Sodium] Other (See Comments)   GI upset      Medication List       Accurate as of 05/20/17  5:25 PM. Always use your most recent med list.          aspirin 81 MG  chewable tablet Chew 81 mg by mouth daily.   carvedilol 12.5 MG tablet Commonly known as:  COREG Take 1 tablet (12.5 mg total) by mouth 2 (two) times daily with a meal.   Clobetasol Prop Emollient Base 0.05 % emollient cream Commonly known as:  CLOBETASOL PROPIONATE E Apply 1 Tube topically 2 (two) times daily.   finasteride 5 MG tablet Commonly known as:  PROSCAR Take 1 tablet by mouth daily.   fluticasone 50 MCG/ACT nasal spray Commonly known as:  FLONASE 2 SPRAYS IN EACH NOSTRIL ONCE A DAY   gabapentin 300 MG capsule Commonly known as:  NEURONTIN Take 1 capsule (300 mg total) by mouth 2 (two) times daily.   hydroquinone 4 % cream  APPLY TO AFFECTED AREAS TWICE A DAY   lisinopril 2.5 MG tablet Commonly known as:  PRINIVIL,ZESTRIL TAKE 2 TABLETS ONCE DAILY AS DIRECTED   loratadine 10 MG tablet Commonly known as:  CLARITIN Take 10 mg by mouth at bedtime.   meloxicam 15 MG tablet Commonly known as:  MOBIC TAKE ONE TABLET BY MOUTH ONE TIME DAILY   mupirocin ointment 2 % Commonly known as:  BACTROBAN Apply 1 application topically 2 (two) times daily. To affected areas   pantoprazole 40 MG tablet Commonly known as:  PROTONIX Take 1 tablet (40 mg total) by mouth daily. For stomach   pravastatin 40 MG tablet Commonly known as:  PRAVACHOL TAKE 1 TABLET ONCE A DAY   sertraline 50 MG tablet Commonly known as:  ZOLOFT TAKE ONE TABLET BY MOUTH ONE TIME DAILY   tamsulosin 0.4 MG Caps capsule Commonly known as:  FLOMAX Take 2 capsules (0.8 mg total) by mouth daily.   traMADol 50 MG tablet Commonly known as:  ULTRAM Take 1 tablet (50 mg total) by mouth 2 (two) times daily. For back pain        Follow-up: Return in about 6 months (around 11/18/2017).  [name], M.D.

## 2017-05-25 LAB — TOXASSURE SELECT 13 (MW), URINE

## 2017-05-27 ENCOUNTER — Other Ambulatory Visit: Payer: Self-pay | Admitting: Family Medicine

## 2017-06-11 ENCOUNTER — Ambulatory Visit (INDEPENDENT_AMBULATORY_CARE_PROVIDER_SITE_OTHER): Payer: Medicare Other | Admitting: Family Medicine

## 2017-06-11 ENCOUNTER — Encounter: Payer: Self-pay | Admitting: Family Medicine

## 2017-06-11 VITALS — BP 136/69 | HR 67 | Temp 97.5°F | Ht 68.0 in | Wt 185.8 lb

## 2017-06-11 DIAGNOSIS — I10 Essential (primary) hypertension: Secondary | ICD-10-CM | POA: Diagnosis not present

## 2017-06-11 DIAGNOSIS — Z125 Encounter for screening for malignant neoplasm of prostate: Secondary | ICD-10-CM

## 2017-06-11 DIAGNOSIS — E782 Mixed hyperlipidemia: Secondary | ICD-10-CM | POA: Diagnosis not present

## 2017-06-11 DIAGNOSIS — R350 Frequency of micturition: Secondary | ICD-10-CM

## 2017-06-11 DIAGNOSIS — K2100 Gastro-esophageal reflux disease with esophagitis, without bleeding: Secondary | ICD-10-CM | POA: Insufficient documentation

## 2017-06-11 LAB — URINALYSIS
Bilirubin, UA: NEGATIVE
GLUCOSE, UA: NEGATIVE
Ketones, UA: NEGATIVE
Leukocytes, UA: NEGATIVE
Nitrite, UA: NEGATIVE
PH UA: 5 (ref 5.0–7.5)
Protein, UA: NEGATIVE
RBC, UA: NEGATIVE
Specific Gravity, UA: 1.015 (ref 1.005–1.030)
UUROB: 0.2 mg/dL (ref 0.2–1.0)

## 2017-06-11 NOTE — Progress Notes (Signed)
Subjective:  Patient ID: Taylor Cross, male    DOB: 1945/09/07  Age: 71 y.o. MRN: [id number]  CC: Follow-up (3 mos) and Gastroesophageal Reflux (med given helped alot)   HPI ABDON PETROSKY presents for 57-monthcheckup and he tells me that he is doing quite well.  The first dose of pantoprazole gave him excellent relief.  He is now symptom-free and taking the medicine daily without side effects.  Denies any melena hematochezia abdominal pain.  He is not having any flatulence nausea or heartburn.  Patient in for follow-up of elevated cholesterol. Doing well without complaints on current medication. Denies side effects of statin including myalgia and arthralgia and nausea. Also in today for liver function testing. Currently no chest pain, shortness of breath or other cardiovascular related symptoms noted.   Follow-up of hypertension. Patient has no history of headache chest pain or shortness of breath or recent cough. Patient also denies symptoms of TIA such as numbness weakness lateralizing. Patient checks  blood pressure at home and has not had any elevated readings recently. Patient denies side effects from his medication. States taking it regularly. Patient is also due for his annual prostate screening blood test.  That will be drawn today as well as part of his blood work.  He has been having some urinary frequency periodically and will run a urine specimen with culture today.  [facility] 06/11/2017 05/20/2017 01/09/2017  Decreased Interest 0 0 0  Down, Depressed, Hopeless 1 0 0  PHQ - 2 Score 1 0 0  Altered sleeping - - -  Tired, decreased energy - - -  Change in appetite - - -  Feeling bad or failure about yourself  - - -  Trouble concentrating - - -  Moving slowly or fidgety/restless - - -  Suicidal thoughts - - -  PHQ-9 Score - - -    History FYuanhas a past medical history of Combined systolic and diastolic heart failure ([place] and Hyperlipidemia.   He has a past  surgical history that includes Tonsillectomy and Hernia repair.   His family history includes COPD in his father; Diabetes in his mother; Heart disease in his mother.He reports that  has never smoked. he has never used smokeless tobacco. He reports that he does not drink alcohol or use drugs.    ROS Review of Systems  Constitutional: Negative for chills, diaphoresis, fever and unexpected weight change.  HENT: Negative for congestion, hearing loss, rhinorrhea and sore throat.   Eyes: Negative for visual disturbance.  Respiratory: Negative for cough and shortness of breath.   Cardiovascular: Negative for chest pain.  Gastrointestinal: Negative for abdominal pain, constipation and diarrhea.  Genitourinary: Positive for frequency. Negative for dysuria and flank pain.  Musculoskeletal: Negative for arthralgias and joint swelling.  Skin: Negative for rash.  Neurological: Negative for dizziness and headaches.  Psychiatric/Behavioral: Negative for dysphoric mood and sleep disturbance.    Objective:  BP 136/69   Pulse 67   Temp (!) 97.5 F (36.4 C) (Oral)   Ht _0  (1.727 m)   Wt 185 lb 12.8 oz (84.3 kg)   BMI 28.25 kg/m   BP Readings from Last 3 Encounters:  06/11/17 136/69  05/20/17 134/74  02/12/17 128/73    Wt Readings from Last 3 Encounters:  06/11/17 185 lb 12.8 oz (84.3 kg)  05/20/17 180 lb (81.6 kg)  02/12/17 177 lb (80.3 kg)     Physical Exam  Constitutional: He is oriented  to person, place, and time. He appears well-developed and well-nourished. No distress.  HENT:  Head: Normocephalic and atraumatic.  Right Ear: External ear normal.  Left Ear: External ear normal.  Nose: Nose normal.  Mouth/Throat: Oropharynx is clear and moist.  Eyes: Conjunctivae and EOM are normal. Pupils are equal, round, and reactive to light.  Neck: Normal range of motion. Neck supple. No thyromegaly present.  Cardiovascular: Normal rate, regular rhythm and normal heart sounds.  No  murmur heard. Pulmonary/Chest: Effort normal and breath sounds normal. No respiratory distress. He has no wheezes. He has no rales.  Abdominal: Soft. Bowel sounds are normal. He exhibits no distension. There is no tenderness.  Lymphadenopathy:    He has no cervical adenopathy.  Neurological: He is alert and oriented to person, place, and time. He has normal reflexes.  Skin: Skin is warm and dry.  Psychiatric: He has a normal mood and affect. His behavior is normal. Judgment and thought content normal.      Assessment & Plan:   Tredarius was seen today for follow-up and gastroesophageal reflux.  Diagnoses and all orders for this visit:  Essential hypertension -     CBC with Differential/Platelet -     CMP14+EGFR  Mixed hyperlipidemia -     Lipid panel  Gastroesophageal reflux disease with esophagitis  Screening for prostate cancer -     PSA, total and free  Urinary frequency -     Urinalysis -     Urine Culture       I am having Rodrigus D. Godino maintain his aspirin, tamsulosin, finasteride, loratadine, Clobetasol Prop Emollient Base, mupirocin ointment, carvedilol, fluticasone, hydroquinone, gabapentin, lisinopril, meloxicam, sertraline, pravastatin, traMADol, pantoprazole, and hydroquinone.  Allergies as of 06/11/2017      Reactions   Neosporin [neomycin-bacitracin Zn-polymyx] Rash   Voltaren [diclofenac Sodium] Other (See Comments)   GI upset      Medication List        Accurate as of 06/11/17  2:09 PM. Always use your most recent med list.          aspirin 81 MG chewable tablet Chew 81 mg by mouth daily.   carvedilol 12.5 MG tablet Commonly known as:  COREG Take 1 tablet (12.5 mg total) by mouth 2 (two) times daily with a meal.   Clobetasol Prop Emollient Base 0.05 % emollient cream Commonly known as:  CLOBETASOL PROPIONATE E Apply 1 Tube topically 2 (two) times daily.   finasteride 5 MG tablet Commonly known as:  PROSCAR Take 1 tablet by mouth  daily.   fluticasone 50 MCG/ACT nasal spray Commonly known as:  FLONASE 2 SPRAYS IN EACH NOSTRIL ONCE A DAY   gabapentin 300 MG capsule Commonly known as:  NEURONTIN Take 1 capsule (300 mg total) by mouth 2 (two) times daily.   hydroquinone 4 % cream APPLY TO AFFECTED AREAS TWICE A DAY   hydroquinone 4 % cream APPLY TO AFFECTED AREAS TWICE A DAY   lisinopril 2.5 MG tablet Commonly known as:  PRINIVIL,ZESTRIL TAKE 2 TABLETS ONCE DAILY AS DIRECTED   loratadine 10 MG tablet Commonly known as:  CLARITIN Take 10 mg by mouth at bedtime.   meloxicam 15 MG tablet Commonly known as:  MOBIC TAKE ONE TABLET BY MOUTH ONE TIME DAILY   mupirocin ointment 2 % Commonly known as:  BACTROBAN Apply 1 application topically 2 (two) times daily. To affected areas   pantoprazole 40 MG tablet Commonly known as:  PROTONIX Take 1 tablet (40  mg total) by mouth daily. For stomach   pravastatin 40 MG tablet Commonly known as:  PRAVACHOL TAKE 1 TABLET ONCE A DAY   sertraline 50 MG tablet Commonly known as:  ZOLOFT TAKE ONE TABLET BY MOUTH ONE TIME DAILY   tamsulosin 0.4 MG Caps capsule Commonly known as:  FLOMAX Take 2 capsules (0.8 mg total) by mouth daily.   traMADol 50 MG tablet Commonly known as:  ULTRAM Take 1 tablet (50 mg total) by mouth 2 (two) times daily. For back pain        Follow-up: Return in about 6 months (around 12/09/2017).  Claretta Fraise, M.D.

## 2017-06-12 LAB — CBC WITH DIFFERENTIAL/PLATELET
Basophils Absolute: 0 10*3/uL (ref 0.0–0.2)
Basos: 0 %
EOS (ABSOLUTE): 0.3 10*3/uL (ref 0.0–0.4)
EOS: 5 %
HEMATOCRIT: 35.1 % — AB (ref 37.5–51.0)
HEMOGLOBIN: 11.6 g/dL — AB (ref 13.0–17.7)
IMMATURE GRANULOCYTES: 0 %
Immature Grans (Abs): 0 10*3/uL (ref 0.0–0.1)
Lymphocytes Absolute: 1.5 10*3/uL (ref 0.7–3.1)
Lymphs: 25 %
MCH: 30.8 pg (ref 26.6–33.0)
MCHC: 33 g/dL (ref 31.5–35.7)
MCV: 93 fL (ref 79–97)
MONOCYTES: 8 %
MONOS ABS: 0.5 10*3/uL (ref 0.1–0.9)
NEUTROS PCT: 62 %
Neutrophils Absolute: 3.8 10*3/uL (ref 1.4–7.0)
Platelets: 179 10*3/uL (ref 150–379)
RBC: 3.77 x10E6/uL — AB (ref 4.14–5.80)
RDW: 13 % (ref 12.3–15.4)
WBC: 6.2 10*3/uL (ref 3.4–10.8)

## 2017-06-12 LAB — CMP14+EGFR
ALT: 15 IU/L (ref 0–44)
AST: 15 IU/L (ref 0–40)
Albumin/Globulin Ratio: 2.1 (ref 1.2–2.2)
Albumin: 4.5 g/dL (ref 3.5–4.8)
Alkaline Phosphatase: 60 IU/L (ref 39–117)
BUN/Creatinine Ratio: 23 (ref 10–24)
BUN: 37 mg/dL — AB (ref 8–27)
Bilirubin Total: 0.3 mg/dL (ref 0.0–1.2)
CALCIUM: 9.2 mg/dL (ref 8.6–10.2)
CO2: 20 mmol/L (ref 20–29)
CREATININE: 1.64 mg/dL — AB (ref 0.76–1.27)
Chloride: 103 mmol/L (ref 96–106)
GFR calc Af Amer: 48 mL/min/{1.73_m2} — ABNORMAL LOW (ref >59)
GFR, EST NON AFRICAN AMERICAN: 41 mL/min/{1.73_m2} — AB (ref >59)
GLOBULIN, TOTAL: 2.1 g/dL (ref 1.5–4.5)
Glucose: 95 mg/dL (ref 65–99)
Potassium: 4.8 mmol/L (ref 3.5–5.2)
SODIUM: 137 mmol/L (ref 134–144)
Total Protein: 6.6 g/dL (ref 6.0–8.5)

## 2017-06-12 LAB — LIPID PANEL
CHOL/HDL RATIO: 2.9 ratio (ref 0.0–5.0)
Cholesterol, Total: 153 mg/dL (ref 100–199)
HDL: 53 mg/dL (ref >39)
LDL CALC: 87 mg/dL (ref 0–99)
TRIGLYCERIDES: 66 mg/dL (ref 0–149)
VLDL CHOLESTEROL CAL: 13 mg/dL (ref 5–40)

## 2017-06-12 LAB — PSA, TOTAL AND FREE
PSA FREE PCT: 19.2 %
PSA, Free: 0.23 ng/mL
Prostate Specific Ag, Serum: 1.2 ng/mL (ref 0.0–4.0)

## 2017-06-13 LAB — URINE CULTURE

## 2017-06-14 LAB — IRON AND TIBC
IRON SATURATION: 27 % (ref 15–55)
IRON: 82 ug/dL (ref 38–169)
TIBC: 309 ug/dL (ref 250–450)
UIBC: 227 ug/dL (ref 111–343)

## 2017-06-14 LAB — VITAMIN B12: Vitamin B-12: 683 pg/mL (ref 232–1245)

## 2017-06-14 LAB — SPECIMEN STATUS REPORT

## 2017-06-14 LAB — TRANSFERRIN: TRANSFERRIN: 253 mg/dL (ref 200–370)

## 2017-06-14 LAB — FOLATE: Folate: >20 ng/mL (ref >3.0)

## 2017-06-14 LAB — FERRITIN: FERRITIN: 56 ng/mL (ref 30–400)

## 2017-06-17 ENCOUNTER — Telehealth: Payer: Self-pay | Admitting: Family Medicine

## 2017-06-17 ENCOUNTER — Other Ambulatory Visit: Payer: Self-pay | Admitting: Family Medicine

## 2017-06-17 DIAGNOSIS — K2100 Gastro-esophageal reflux disease with esophagitis, without bleeding: Secondary | ICD-10-CM

## 2017-06-17 DIAGNOSIS — I10 Essential (primary) hypertension: Secondary | ICD-10-CM

## 2017-06-17 DIAGNOSIS — I519 Heart disease, unspecified: Secondary | ICD-10-CM

## 2017-06-17 NOTE — Telephone Encounter (Signed)
Discussed with pt. DC the meloxicam. Follow up in 2-3 weeks with CBC, CMP a day or two before (lab entered)

## 2017-06-27 ENCOUNTER — Telehealth: Payer: Self-pay | Admitting: Family Medicine

## 2017-06-27 NOTE — Telephone Encounter (Signed)
Left message for patient to call to review problem in details or discuss future appointment.

## 2017-06-28 ENCOUNTER — Ambulatory Visit (INDEPENDENT_AMBULATORY_CARE_PROVIDER_SITE_OTHER): Payer: Medicare Other | Admitting: Physician Assistant

## 2017-06-28 ENCOUNTER — Encounter: Payer: Self-pay | Admitting: Physician Assistant

## 2017-06-28 VITALS — BP 137/83 | HR 79 | Temp 98.6°F | Ht 68.0 in | Wt 183.4 lb

## 2017-06-28 DIAGNOSIS — M109 Gout, unspecified: Secondary | ICD-10-CM | POA: Diagnosis not present

## 2017-06-28 MED ORDER — COLCHICINE 0.6 MG PO TABS: 0.6000 mg | ORAL_TABLET | Freq: Every day | ORAL | 2 refills | Status: DC

## 2017-06-28 NOTE — Patient Instructions (Signed)

## 2017-06-28 NOTE — Progress Notes (Signed)
BP 137/83   Pulse 79   Temp 98.6 F (37 C) (Oral)   Ht 5' 8"  (1.727 m)   Wt 183 lb 6.4 oz (83.2 kg)   BMI 27.89 kg/m    Subjective:    Patient ID: Taylor Cross, male    DOB: 1945/10/28, 71 y.o.   MRN: [id number]  HPI: Taylor Cross is a 71 y.o. male presenting on 06/28/2017 for Pain (toes)  Patient comes in with 3-day pain of bilateral great toe pain.  The right one is extremely painful.  2 days ago it became very warm to the touch and extremely painful to touch.  Even the sheet at night bothers him.  His shoes of course are bothering him.  He had a gout episode many years ago.  He does not chronically have this.  We have also discussed long-term NSAID therapy.  He had stopped this because of his kidney functions being elevated.  I told him that we need to continue to avoid NSAIDs as much as possible.  I have also asked him to give [place] a call back if things did not improve.  Relevant past medical, surgical, family and social history reviewed and updated as indicated. Allergies and medications reviewed and updated.  Past Medical History:  Diagnosis Date  . 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 Hyperlipidemia     Past Surgical History:  Procedure Laterality Date  . HERNIA REPAIR    . TONSILLECTOMY      Review of Systems  Constitutional: Negative.  Negative for appetite change and fatigue.  Eyes: Negative for pain and visual disturbance.  Respiratory: Negative.  Negative for cough, chest tightness, shortness of breath and wheezing.   Cardiovascular: Negative.  Negative for chest pain, palpitations and leg swelling.  Gastrointestinal: Negative.  Negative for abdominal pain, diarrhea, nausea and vomiting.  Genitourinary: Negative.   Musculoskeletal: Positive for arthralgias and joint swelling.  Skin: Negative.  Negative for color change and rash.  Neurological: Negative.  Negative for weakness, numbness and headaches.  Psychiatric/Behavioral: Negative.      Allergies as of 06/28/2017      Reactions   Neosporin [neomycin-bacitracin Zn-polymyx] Rash   Voltaren [diclofenac Sodium] Other (See Comments)   GI upset      Medication List        Accurate as of 06/28/17  9:34 AM. Always use your most recent med list.          aspirin 81 MG chewable tablet Chew 81 mg by mouth daily.   carvedilol 12.5 MG tablet Commonly known as:  COREG Take 1 tablet (12.5 mg total) by mouth 2 (two) times daily with a meal.   Clobetasol Prop Emollient Base 0.05 % emollient cream Commonly known as:  CLOBETASOL PROPIONATE E Apply 1 Tube topically 2 (two) times daily.   colchicine 0.6 MG tablet Take 1 tablet (0.6 mg total) by mouth daily. Take one tablet twice daily first day.   finasteride 5 MG tablet Commonly known as:  PROSCAR Take 1 tablet by mouth daily.   fluticasone 50 MCG/ACT nasal spray Commonly known as:  FLONASE 2 SPRAYS IN EACH NOSTRIL ONCE A DAY   gabapentin 300 MG capsule Commonly known as:  NEURONTIN Take 1 capsule (300 mg total) by mouth 2 (two) times daily.   hydroquinone 4 % cream APPLY TO AFFECTED AREAS TWICE A DAY   lisinopril 2.5 MG tablet Commonly known as:  PRINIVIL,ZESTRIL TAKE 2 TABLETS ONCE DAILY AS  DIRECTED   loratadine 10 MG tablet Commonly known as:  CLARITIN Take 10 mg by mouth at bedtime.   mupirocin ointment 2 % Commonly known as:  BACTROBAN Apply 1 application topically 2 (two) times daily. To affected areas   pantoprazole 40 MG tablet Commonly known as:  PROTONIX Take 1 tablet (40 mg total) by mouth daily. For stomach   pravastatin 40 MG tablet Commonly known as:  PRAVACHOL TAKE 1 TABLET ONCE A DAY   sertraline 50 MG tablet Commonly known as:  ZOLOFT TAKE ONE TABLET BY MOUTH ONE TIME DAILY   tamsulosin 0.4 MG Caps capsule Commonly known as:  FLOMAX Take 2 capsules (0.8 mg total) by mouth daily.   traMADol 50 MG tablet Commonly known as:  ULTRAM Take 1 tablet (50 mg total) by mouth 2 (two)  times daily. For back pain          Objective:    BP 137/83   Pulse 79   Temp 98.6 F (37 C) (Oral)   Ht 5' 8"  (1.727 m)   Wt 183 lb 6.4 oz (83.2 kg)   BMI 27.89 kg/m   Allergies  Allergen Reactions  . Neosporin [Neomycin-Bacitracin Zn-Polymyx] Rash  . Voltaren [Diclofenac Sodium] Other (See Comments)    GI upset    Physical Exam  Constitutional: He appears well-developed and well-nourished. No distress.  HENT:  Head: Normocephalic and atraumatic.  Eyes: Conjunctivae and EOM are normal. Pupils are equal, round, and reactive to light.  Cardiovascular: Normal rate, regular rhythm and normal heart sounds.  Pulmonary/Chest: Effort normal and breath sounds normal. No respiratory distress.  Musculoskeletal:       Right foot: There is decreased range of motion, tenderness and swelling.       Left foot: There is decreased range of motion, tenderness and swelling.       Feet:  Skin: Skin is warm and dry.  Psychiatric: He has a normal mood and affect. His behavior is normal.  Nursing note and vitals reviewed.   Results for orders placed or performed in visit on 06/11/17  Urine Culture  Result Value Ref Range   Urine Culture, Routine Final report (A)    Organism ID, Bacteria Escherichia coli (A)    Antimicrobial Susceptibility Comment   CBC with Differential/Platelet  Result Value Ref Range   WBC 6.2 3.4 - 10.8 x10E3/uL   RBC 3.77 (L) 4.14 - 5.80 x10E6/uL   Hemoglobin 11.6 (L) 13.0 - 17.7 g/dL   Hematocrit 35.1 (L) 37.5 - 51.0 %   MCV 93 79 - 97 fL   MCH 30.8 26.6 - 33.0 pg   MCHC 33.0 31.5 - 35.7 g/dL   RDW 13.0 12.3 - 15.4 %   Platelets 179 150 - 379 x10E3/uL   Neutrophils 62 Not Estab. %   Lymphs 25 Not Estab. %   Monocytes 8 Not Estab. %   Eos 5 Not Estab. %   Basos 0 Not Estab. %   Neutrophils Absolute 3.8 1.4 - 7.0 x10E3/uL   Lymphocytes Absolute 1.5 0.7 - 3.1 x10E3/uL   Monocytes Absolute 0.5 0.1 - 0.9 x10E3/uL   EOS (ABSOLUTE) 0.3 0.0 - 0.4 x10E3/uL    Basophils Absolute 0.0 0.0 - 0.2 x10E3/uL   Immature Granulocytes 0 Not Estab. %   Immature Grans (Abs) 0.0 0.0 - 0.1 x10E3/uL  CMP14+EGFR  Result Value Ref Range   Glucose 95 65 - 99 mg/dL   BUN 37 (H) 8 - 27 mg/dL   Creatinine,  Ser 1.64 (H) 0.76 - 1.27 mg/dL   GFR calc non Af Amer 41 (L) >59 mL/min/1.73   GFR calc Af Amer 48 (L) >59 mL/min/1.73   BUN/Creatinine Ratio 23 10 - 24   Sodium 137 134 - 144 mmol/L   Potassium 4.8 3.5 - 5.2 mmol/L   Chloride 103 96 - 106 mmol/L   CO2 20 20 - 29 mmol/L   Calcium 9.2 8.6 - 10.2 mg/dL   Total Protein 6.6 6.0 - 8.5 g/dL   Albumin 4.5 3.5 - 4.8 g/dL   Globulin, Total 2.1 1.5 - 4.5 g/dL   Albumin/Globulin Ratio 2.1 1.2 - 2.2   Bilirubin Total 0.3 0.0 - 1.2 mg/dL   Alkaline Phosphatase 60 39 - 117 IU/L   AST 15 0 - 40 IU/L   ALT 15 0 - 44 IU/L  Lipid panel  Result Value Ref Range   Cholesterol, Total 153 100 - 199 mg/dL   Triglycerides 66 0 - 149 mg/dL   HDL 53 >39 mg/dL   VLDL Cholesterol Cal 13 5 - 40 mg/dL   LDL Calculated 87 0 - 99 mg/dL   Chol/HDL Ratio 2.9 0.0 - 5.0 ratio  PSA, total and free  Result Value Ref Range   Prostate Specific Ag, Serum 1.2 0.0 - 4.0 ng/mL   PSA, Free 0.23 N/A ng/mL   PSA, Free Pct 19.2 %  Urinalysis  Result Value Ref Range   Specific Gravity, UA 1.015 1.005 - 1.030   pH, UA 5.0 5.0 - 7.5   Color, UA Yellow Yellow   Appearance Ur Clear Clear   Leukocytes, UA Negative Negative   Protein, UA Negative Negative/Trace   Glucose, UA Negative Negative   Ketones, UA Negative Negative   RBC, UA Negative Negative   Bilirubin, UA Negative Negative   Urobilinogen, Ur 0.2 0.2 - 1.0 mg/dL   Nitrite, UA Negative Negative  Iron and TIBC  Result Value Ref Range   Total Iron Binding Capacity 309 250 - 450 ug/dL   UIBC 227 111 - 343 ug/dL   Iron 82 38 - 169 ug/dL   Iron Saturation 27 15 - 55 %  Folate  Result Value Ref Range   Folate >20.0 >3.0 ng/mL  Vitamin B12  Result Value Ref Range   Vitamin B-12 683  232 - 1,245 pg/mL  Transferrin  Result Value Ref Range   Transferrin 253 200 - 370 mg/dL  Ferritin  Result Value Ref Range   Ferritin 56 30 - 400 ng/mL  Specimen status report  Result Value Ref Range   specimen status report Comment       Assessment & Plan:   1. Acute gout involving toe of right foot, unspecified cause Information about gout exacerbators given - colchicine 0.6 MG tablet; Take 1 tablet (0.6 mg total) by mouth daily. Take one tablet twice daily first day.  Dispense: 20 tablet; Refill: 2    Current Outpatient Medications:  .  aspirin 81 MG chewable tablet, Chew 81 mg by mouth daily., Disp: , Rfl:  .  carvedilol (COREG) 12.5 MG tablet, Take 1 tablet (12.5 mg total) by mouth 2 (two) times daily with a meal., Disp: 180 tablet, Rfl: 1 .  Clobetasol Prop Emollient Base (CLOBETASOL PROPIONATE E) 0.05 % emollient cream, Apply 1 Tube topically 2 (two) times daily., Disp: 60 g, Rfl: 3 .  finasteride (PROSCAR) 5 MG tablet, Take 1 tablet by mouth daily., Disp: , Rfl:  .  fluticasone (FLONASE) 50 MCG/ACT nasal  spray, 2 SPRAYS IN EACH NOSTRIL ONCE A DAY, Disp: 16 g, Rfl: 5 .  gabapentin (NEURONTIN) 300 MG capsule, Take 1 capsule (300 mg total) by mouth 2 (two) times daily., Disp: 180 capsule, Rfl: 0 .  hydroquinone 4 % cream, APPLY TO AFFECTED AREAS TWICE A DAY, Disp: 28.35 g, Rfl: 2 .  lisinopril (PRINIVIL,ZESTRIL) 2.5 MG tablet, TAKE 2 TABLETS ONCE DAILY AS DIRECTED, Disp: 60 tablet, Rfl: 3 .  loratadine (CLARITIN) 10 MG tablet, Take 10 mg by mouth at bedtime., Disp: , Rfl:  .  mupirocin ointment (BACTROBAN) 2 %, Apply 1 application topically 2 (two) times daily. To affected areas, Disp: 22 g, Rfl: 5 .  pantoprazole (PROTONIX) 40 MG tablet, Take 1 tablet (40 mg total) by mouth daily. For stomach, Disp: 30 tablet, Rfl: 11 .  pravastatin (PRAVACHOL) 40 MG tablet, TAKE 1 TABLET ONCE A DAY, Disp: 30 tablet, Rfl: 5 .  sertraline (ZOLOFT) 50 MG tablet, TAKE ONE TABLET BY MOUTH ONE TIME  DAILY, Disp: 30 tablet, Rfl: 2 .  tamsulosin (FLOMAX) 0.4 MG CAPS capsule, Take 2 capsules (0.8 mg total) by mouth daily., Disp: 60 capsule, Rfl: 11 .  traMADol (ULTRAM) 50 MG tablet, Take 1 tablet (50 mg total) by mouth 2 (two) times daily. For back pain, Disp: 60 tablet, Rfl: 5 .  colchicine 0.6 MG tablet, Take 1 tablet (0.6 mg total) by mouth daily. Take one tablet twice daily first day., Disp: 20 tablet, Rfl: 2 Continue all other maintenance medications as listed above.  Follow up plan: Return if symptoms worsen or fail to improve.  Educational handout given for gout  Terald Sleeper PA-C [facility] [address]  [place], [place] [address] [phone]   06/28/2017, 9:34 AM

## 2017-07-16 ENCOUNTER — Ambulatory Visit (INDEPENDENT_AMBULATORY_CARE_PROVIDER_SITE_OTHER): Payer: Medicare Other | Admitting: Family Medicine

## 2017-07-16 ENCOUNTER — Encounter: Payer: Self-pay | Admitting: Family Medicine

## 2017-07-16 VITALS — BP 119/63 | HR 62 | Temp 96.6°F | Ht 68.0 in | Wt 181.0 lb

## 2017-07-16 DIAGNOSIS — R899 Unspecified abnormal finding in specimens from other organs, systems and tissues: Secondary | ICD-10-CM

## 2017-07-16 DIAGNOSIS — N183 Chronic kidney disease, stage 3 unspecified: Secondary | ICD-10-CM

## 2017-07-16 NOTE — Progress Notes (Signed)
Subjective:  Patient ID: Taylor Cross, male    DOB: Apr 25, 1946  Age: 71 y.o. MRN: [id number]  CC: Follow-up (pt here today following up for bloodwork and also on gout flare up from 2 weeks ago)   HPI CARLEY STRICKLING presents for recheck of his renal function.  Patient was seen 1 month ago for evaluation and routine blood work showed that his creatinine had declined in a rather short time from 1.01-1.64.  He was taken off of his NSAID meloxicam.  Today he verifies that he did discontinue that medication.  Of note is that his estimated GFR had fallen also from a 75% to 41% over the same timeframe.  He was supposed to have come in early for blood work to determine if his renal function had stabilized.  However that has not as yet been completed.  It will be performed today if a spot check shows any further decline 24-hour urine for creatinine clearance will be obtained.  Patient tells me that he did take colchicine a couple of weeks ago for a gout flare however that flare has  currently resolved.  He is not taking allopurinol currently.  He has some residual swelling at the site of the gout attack at the right foot.  No other edema noted.  He is urinating normally without frequency or oliguria.  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   He has a past surgical history that includes Tonsillectomy and Hernia repair.   His family history includes COPD in his father; Diabetes in his mother; Heart disease in his mother.He reports that  has never  smoked. he has never used smokeless tobacco. He reports that he does not drink alcohol or use drugs.    ROS Review of Systems Unremarkable except as per HPI Objective:  BP 119/63   Pulse 62   Temp (!) 96.6 F (35.9 C) (Oral)   Ht _2  (1.727 m)   Wt 181 lb (82.1 kg)   BMI 27.52 kg/m   BP Readings from Last 3 Encounters:  07/16/17 119/63  06/28/17 137/83  06/11/17 136/69    Wt Readings from Last 3 Encounters:  07/16/17 181 lb (82.1 kg)  06/28/17 183 lb 6.4 oz (83.2 kg)  06/11/17 185 lb 12.8 oz (84.3 kg)     Physical Exam    Assessment & Plan:   [place] was seen today for follow-up.  Diagnoses and all orders for this visit:  Chronic renal insufficiency, stage III (moderate) (HCC)  Abnormal laboratory test result -     BMP8+EGFR       I am having Oluwatobi D. Cabal maintain his aspirin, tamsulosin, finasteride, loratadine, Clobetasol Prop Emollient Base, mupirocin ointment, carvedilol, fluticasone, lisinopril, traMADol, pantoprazole, hydroquinone, sertraline, pravastatin, gabapentin, and colchicine.  Allergies as of 07/16/2017      Reactions   Neosporin [neomycin-bacitracin Zn-polymyx] Rash   Voltaren [diclofenac Sodium] Other (See Comments)  GI upset      Medication List        Accurate as of 07/16/17  8:40 PM. Always use your most recent med list.          aspirin 81 MG chewable tablet Chew 81 mg by mouth daily.   carvedilol 12.5 MG tablet Commonly known as:  COREG Take 1 tablet (12.5 mg total) by mouth 2 (two) times daily with a meal.   Clobetasol Prop Emollient Base 0.05 % emollient cream Commonly known as:  CLOBETASOL PROPIONATE E Apply 1 Tube topically 2 (two) times daily.   colchicine 0.6 MG tablet Take 1 tablet (0.6 mg total) by mouth daily. Take one tablet twice daily first day.   finasteride 5 MG tablet Commonly known as:  PROSCAR Take 1 tablet by mouth daily.   fluticasone 50 MCG/ACT nasal spray Commonly known as:  FLONASE 2  SPRAYS IN EACH NOSTRIL ONCE A DAY   gabapentin 300 MG capsule Commonly known as:  NEURONTIN Take 1 capsule (300 mg total) by mouth 2 (two) times daily.   hydroquinone 4 % cream APPLY TO AFFECTED AREAS TWICE A DAY   lisinopril 2.5 MG tablet Commonly known as:  PRINIVIL,ZESTRIL TAKE 2 TABLETS ONCE DAILY AS DIRECTED   loratadine 10 MG tablet Commonly known as:  CLARITIN Take 10 mg by mouth at bedtime.   mupirocin ointment 2 % Commonly known as:  BACTROBAN Apply 1 application topically 2 (two) times daily. To affected areas   pantoprazole 40 MG tablet Commonly known as:  PROTONIX Take 1 tablet (40 mg total) by mouth daily. For stomach   pravastatin 40 MG tablet Commonly known as:  PRAVACHOL TAKE 1 TABLET ONCE A DAY   sertraline 50 MG tablet Commonly known as:  ZOLOFT TAKE ONE TABLET BY MOUTH ONE TIME DAILY   tamsulosin 0.4 MG Caps capsule Commonly known as:  FLOMAX Take 2 capsules (0.8 mg total) by mouth daily.   traMADol 50 MG tablet Commonly known as:  ULTRAM Take 1 tablet (50 mg total) by mouth 2 (two) times daily. For back pain        Follow-up: As previously arranged and as needed based on test results.  Claretta Fraise, M.D.

## 2017-07-17 LAB — BMP8+EGFR
BUN/Creatinine Ratio: 19 (ref 10–24)
BUN: 31 mg/dL — ABNORMAL HIGH (ref 8–27)
CHLORIDE: 105 mmol/L (ref 96–106)
CO2: 22 mmol/L (ref 20–29)
Calcium: 9.3 mg/dL (ref 8.6–10.2)
Creatinine, Ser: 1.63 mg/dL — ABNORMAL HIGH (ref 0.76–1.27)
GFR, EST AFRICAN AMERICAN: 48 mL/min/{1.73_m2} — AB (ref >59)
GFR, EST NON AFRICAN AMERICAN: 42 mL/min/{1.73_m2} — AB (ref >59)
GLUCOSE: 93 mg/dL (ref 65–99)
Potassium: 4.9 mmol/L (ref 3.5–5.2)
Sodium: 141 mmol/L (ref 134–144)

## 2017-07-19 ENCOUNTER — Other Ambulatory Visit: Payer: Self-pay | Admitting: *Deleted

## 2017-07-19 DIAGNOSIS — N289 Disorder of kidney and ureter, unspecified: Secondary | ICD-10-CM

## 2017-07-19 NOTE — Progress Notes (Signed)
neph 

## 2017-07-26 ENCOUNTER — Other Ambulatory Visit: Payer: Self-pay | Admitting: *Deleted

## 2017-07-26 ENCOUNTER — Other Ambulatory Visit: Payer: Medicare Other

## 2017-07-26 DIAGNOSIS — R7989 Other specified abnormal findings of blood chemistry: Secondary | ICD-10-CM

## 2017-07-26 DIAGNOSIS — N289 Disorder of kidney and ureter, unspecified: Secondary | ICD-10-CM

## 2017-07-27 LAB — CREATININE, SERUM
CREATININE: 1.31 mg/dL — AB (ref 0.76–1.27)
GFR calc Af Amer: 63 mL/min/{1.73_m2} (ref >59)
GFR calc non Af Amer: 54 mL/min/{1.73_m2} — ABNORMAL LOW (ref >59)

## 2017-07-28 LAB — SPECIMEN STATUS REPORT

## 2017-07-29 LAB — CREATININE CLEARANCE, URINE, 24 HOUR
CREAT CLEAR: 41 mL/min — AB (ref 97–137)
CREATININE 24H UR: 765 mg/(24.h) — AB (ref 1000–2000)
CREATININE, UR: 51 mg/dL
Creatinine, Ser: 1.31 mg/dL — ABNORMAL HIGH (ref 0.76–1.27)
GFR, EST AFRICAN AMERICAN: 63 mL/min/{1.73_m2} (ref >59)
GFR, EST NON AFRICAN AMERICAN: 54 mL/min/{1.73_m2} — AB (ref >59)

## 2017-08-13 ENCOUNTER — Ambulatory Visit: Payer: BLUE CROSS/BLUE SHIELD | Admitting: Cardiology

## 2017-08-29 ENCOUNTER — Other Ambulatory Visit: Payer: Self-pay | Admitting: Family Medicine

## 2017-08-29 ENCOUNTER — Other Ambulatory Visit: Payer: Self-pay | Admitting: Cardiology

## 2017-09-04 ENCOUNTER — Ambulatory Visit (INDEPENDENT_AMBULATORY_CARE_PROVIDER_SITE_OTHER): Payer: Medicare Other | Admitting: Cardiology

## 2017-09-04 ENCOUNTER — Encounter: Payer: Self-pay | Admitting: Cardiology

## 2017-09-04 VITALS — BP 116/72 | HR 70 | Ht 68.0 in | Wt 186.2 lb

## 2017-09-04 DIAGNOSIS — E782 Mixed hyperlipidemia: Secondary | ICD-10-CM

## 2017-09-04 DIAGNOSIS — I10 Essential (primary) hypertension: Secondary | ICD-10-CM | POA: Diagnosis not present

## 2017-09-04 DIAGNOSIS — I5022 Chronic systolic (congestive) heart failure: Secondary | ICD-10-CM

## 2017-09-04 NOTE — Progress Notes (Signed)
Clinical Summary Mr. Appling is a 72 y.o.male  seen today for follow up of the following medical problems.   1. Chronic Systolic/Diastolic heart failure  - 10/2013 echo which showed LVEF 40-45%, mid-inferolateral wall hypokinesis, and grade II diastolic dysfunction.  - repeat echo 08/2014 LVEF 35-40%, basal to mid inferolateral and inferior walls, grade I diastolic dysfunction - completed lexiscan which showed moderate sized moderate to severe intensity inferolateral wall defect and small apical to mid inferior wall defect. Both areas were fixed and consistent with scar.    - no recent edema. No SOB/DOE.   2. HL - 05/2017 TC 153 TG 66 HDL 53 LDL 87 - compliant with statin   3. HTN - compliant with meds  4. Worsening renal function - noted by pcp. Cr went from 1.01 to 1.64, last check back to 1.3.  -  NSAIDs were stopped - stays well hydrated.    Past Medical History:  Diagnosis Date  . Combined systolic and diastolic heart failure ([place])   . Hyperlipidemia      Allergies  Allergen Reactions  . Neosporin [Neomycin-Bacitracin Zn-Polymyx] Rash  . Voltaren [Diclofenac Sodium] Other (See Comments)    GI upset     Current Outpatient Medications  Medication Sig Dispense Refill  . aspirin 81 MG chewable tablet Chew 81 mg by mouth daily.    . carvedilol (COREG) 12.5 MG tablet Take 1 tablet (12.5 mg total) by mouth 2 (two) times daily with a meal. 180 tablet 1  . carvedilol (COREG) 25 MG tablet TAKE  (1)  TABLET TWICE A DAY WITH MEALS (BREAKFAST AND SUPPER) 60 tablet 4  . Clobetasol Prop Emollient Base (CLOBETASOL PROPIONATE E) 0.05 % emollient cream Apply 1 Tube topically 2 (two) times daily. 60 g 3  . colchicine 0.6 MG tablet Take 1 tablet (0.6 mg total) by mouth daily. Take one tablet twice daily first day. 20 tablet 2  . finasteride (PROSCAR) 5 MG tablet Take 1 tablet by mouth daily.    . fluticasone (FLONASE) 50 MCG/ACT nasal spray 2 SPRAYS IN EACH NOSTRIL ONCE A  DAY 16 g 5  . gabapentin (NEURONTIN) 300 MG capsule Take 1 capsule (300 mg total) by mouth 2 (two) times daily. 180 capsule 0  . hydroquinone 4 % cream APPLY TO AFFECTED AREAS TWICE A DAY 28.35 g 2  . lisinopril (PRINIVIL,ZESTRIL) 2.5 MG tablet TAKE 2 TABLETS ONCE DAILY AS DIRECTED 60 tablet 3  . lisinopril (PRINIVIL,ZESTRIL) 2.5 MG tablet TAKE 2 TABLETS ONCE DAILY AS DIRECTED 60 tablet 6  . loratadine (CLARITIN) 10 MG tablet Take 10 mg by mouth at bedtime.    . mupirocin ointment (BACTROBAN) 2 % Apply 1 application topically 2 (two) times daily. To affected areas 22 g 5  . pantoprazole (PROTONIX) 40 MG tablet Take 1 tablet (40 mg total) by mouth daily. For stomach 30 tablet 11  . pravastatin (PRAVACHOL) 40 MG tablet TAKE 1 TABLET ONCE A DAY 30 tablet 5  . sertraline (ZOLOFT) 50 MG tablet TAKE ONE TABLET BY MOUTH ONE TIME DAILY 30 tablet 2  . tamsulosin (FLOMAX) 0.4 MG CAPS capsule Take 2 capsules (0.8 mg total) by mouth daily. 60 capsule 11  . traMADol (ULTRAM) 50 MG tablet Take 1 tablet (50 mg total) by mouth 2 (two) times daily. For back pain 60 tablet 5   No current facility-administered medications for this visit.      Past Surgical History:  Procedure Laterality Date  . HERNIA  REPAIR    . TONSILLECTOMY       Allergies  Allergen Reactions  . Neosporin [Neomycin-Bacitracin Zn-Polymyx] Rash  . Voltaren [Diclofenac Sodium] Other (See Comments)    GI upset      Family History  Problem Relation Age of Onset  . Heart disease Mother   . Diabetes Mother   . COPD Father      Social History Mr. Hommel reports that  has never smoked. he has never used smokeless tobacco. Mr. Weidner reports that he does not drink alcohol.   Review of Systems CONSTITUTIONAL: No weight loss, fever, chills, weakness or fatigue.  HEENT: Eyes: No visual loss, blurred vision, double vision or yellow sclerae.No hearing loss, sneezing, congestion, runny nose or sore throat.  SKIN: No rash or itching.   CARDIOVASCULAR: per hpi RESPIRATORY: No shortness of breath, cough or sputum.  GASTROINTESTINAL: No anorexia, nausea, vomiting or diarrhea. No abdominal pain or blood.  GENITOURINARY: No burning on urination, no polyuria NEUROLOGICAL: No headache, dizziness, syncope, paralysis, ataxia, numbness or tingling in the extremities. No change in bowel or bladder control.  MUSCULOSKELETAL: No muscle, back pain, joint pain or stiffness.  LYMPHATICS: No enlarged nodes. No history of splenectomy.  PSYCHIATRIC: No history of depression or anxiety.  ENDOCRINOLOGIC: No reports of sweating, cold or heat intolerance. No polyuria or polydipsia.  Marland Kitchen   Physical Examination Vitals:   09/04/17 1426  BP: 116/72  Pulse: 70  SpO2: 98%   Vitals:   09/04/17 1426  Weight: 186 lb 3.2 oz (84.5 kg)  Height: 5' 8"  (1.727 m)    Gen: resting comfortably, no acute distress HEENT: no scleral icterus, pupils equal round and reactive, no palptable cervical adenopathy,  CV: RRR, no m/r/g, no jvd Resp: Clear to auscultation bilaterally GI: abdomen is soft, non-tender, non-distended, normal bowel sounds, no hepatosplenomegaly MSK: extremities are warm, no edema.  Skin: warm, no rash Neuro:  no focal deficits Psych: appropriate affect   Diagnostic Studies 10/2013 Echo Left ventricle: The cavity size was mildly dilated. Wall thickness was normal. Systolic function was mildly to moderately reduced. The estimated ejection fraction is 40%. Features are consistent with a pseudonormal left ventricular filling pattern, with concomitant abnormal relaxation and increased filling pressure (grade 2 diastolic dysfunction). Doppler parameters are consistent with high ventricular filling pressure. - Regional wall motion abnormality: Mild to moderate hypokinesis of the mid inferolateral myocardium. The remaining walls are mildly hypokinetic. - Aortic valve: Trileaflet; mildly thickened, mildly calcified leaflets. There  was no stenosis. - Mitral valve: Mildly thickened leaflets . Mild to moderate regurgitation. - Left atrium: The atrium was moderately dilated. - Tricuspid valve: Mild regurgitation. Inadequate TR jet to accurately assess pulmonary pressures. - Pericardium, extracardiac: There was a left pleural effusion.  11/11/13 Lexiscan MPI Gated imaging reveals an EDV of 223, ESV of 153, TID ratio 0.99, and LVEF of 31% with global hypokinesis, most prominent in the apex and inferolateral walls.  IMPRESSION: Intermediate risk Lexiscan Cardiolite. No diagnostic ST segment changes were noted to indicate ischemia, frequent PVCs were seen without sustained arrhythmia. Perfusion imaging is most consistent with scar affecting the inferior wall and inferolateral wall as outlined, no large ischemic zones however. LVEF is calculated at 31% with global hypokinesis that is most prominent at the apex and inferior wall. EDV indicates moderate to severe chamber dilatation. Findings are consistent with probable ischemic cardiomyopathy.   08/2014 echo Study Conclusions  - Left ventricle: The cavity size was mildly dilated. Wall thickness was increased  in a pattern of moderate LVH. Systolic function was moderately reduced. The estimated ejection fraction was in the range of 35% to 40%. There is hypokinesis to akinesis of the basal-mid inferolateral and inferior myocardium. Doppler parameters are consistent with abnormal left ventricular relaxation (grade 1 diastolic dysfunction). - Aortic valve: Mildly to moderately calcified annulus. Trileaflet. - Mitral valve: Mildly calcified annulus. There was mild regurgitation. - Left atrium: The atrium was mildly to moderately dilated. - Right atrium: Central venous pressure (est): 3 mm Hg. - Atrial septum: Possible PFO or secundum ASD with apparent left to right flow noted by color Doppler. Cannot exclude vigorous venous return adjacent to  septum. Agitated saline study could be considered for further evaluation. - Tricuspid valve: There was trivial regurgitation. - Pulmonary arteries: PA peak pressure: 17 mm Hg (S). - Pericardium, extracardiac: There was no pericardial effusion.  Recommendations: Moderate LVH with mild LV chamber dilatation and LVEF approximately 35-40%. Hypokinesis to akinesis of the mid to basal inferolateral/inferior walls noted suggestive of ischemic cardiomyopathy. Grade 1 diastolic dysfunction. Compared to prior study in April 2015, wall motion has improved somewhat and ejection fraction looks to be higher, although graded in similar range last time. Mild to moderate left atrial enlargement. Mild MAC with mild mitral regurgitation. Possible PFO or secundum ASD with apparent left to right flow noted by color Doppler. Cannot exclude vigorous venous return adjacent to septum. Agitated saline study could be considered for further evaluation. Trivial tricuspid regurgitation with PASP normal range.          Assessment and Plan  1.Chronic Systolic/diastolic heart failure -Medical therapy has been limited due to orthostatic dizziness.  - no symptoms, continue current meds   2. HTN - bp at goal, conitnue current meds  3. HL -  continue statin  F/u 6 months    Arnoldo Lenis, M.D.

## 2017-09-04 NOTE — Patient Instructions (Signed)

## 2017-09-07 ENCOUNTER — Encounter: Payer: Self-pay | Admitting: Cardiology

## 2017-09-09 ENCOUNTER — Other Ambulatory Visit: Payer: Self-pay | Admitting: Family Medicine

## 2017-09-12 ENCOUNTER — Other Ambulatory Visit: Payer: Self-pay | Admitting: Family Medicine

## 2017-10-09 ENCOUNTER — Other Ambulatory Visit: Payer: Self-pay | Admitting: Family Medicine

## 2017-10-09 NOTE — Telephone Encounter (Signed)
OV 12/11/17 

## 2017-10-10 ENCOUNTER — Other Ambulatory Visit: Payer: Medicare Other

## 2017-10-10 ENCOUNTER — Other Ambulatory Visit: Payer: Self-pay | Admitting: *Deleted

## 2017-10-10 MED ORDER — LORATADINE 10 MG PO TABS: 10.0000 mg | ORAL_TABLET | Freq: Every day | ORAL | 3 refills | Status: DC

## 2017-10-11 ENCOUNTER — Other Ambulatory Visit: Payer: Self-pay | Admitting: *Deleted

## 2017-10-11 MED ORDER — LORATADINE 10 MG PO TABS: 10.0000 mg | ORAL_TABLET | Freq: Every day | ORAL | 1 refills | Status: DC

## 2017-10-17 ENCOUNTER — Ambulatory Visit (INDEPENDENT_AMBULATORY_CARE_PROVIDER_SITE_OTHER): Payer: Medicare Other | Admitting: *Deleted

## 2017-10-17 ENCOUNTER — Encounter: Payer: Self-pay | Admitting: *Deleted

## 2017-10-17 VITALS — BP 120/65 | HR 66 | Ht 67.0 in | Wt 194.0 lb

## 2017-10-17 DIAGNOSIS — Z0000 Encounter for general adult medical examination without abnormal findings: Secondary | ICD-10-CM

## 2017-10-17 NOTE — Patient Instructions (Addendum)
Contact your kidney doctor about your recent lab results Dr Kristian Covey [phone]   Dr Rogers Blocker [phone]  [place] , Thank you for taking time to come for your Medicare Wellness Visit. I appreciate your ongoing commitment to your health goals. Please review the following plan we discussed and let me know if I can assist you in the future.   These are the goals we discussed: Goals    . Exercise 150 min/wk Moderate Activity       This is a list of the screening recommended for you and due dates:  Health Maintenance  Topic Date Due  .  Hepatitis C: One time screening is recommended by Center for Disease Control  (CDC) for  adults born from [birth year] through 1965.   [birth year]  . Tetanus Vaccine  10/07/2023  . Flu Shot  Completed  . Pneumonia vaccines  Completed  . Colon Cancer Screening  Discontinued

## 2017-10-17 NOTE — Progress Notes (Addendum)
Subjective:   [place] is a 72 y.o. male who presents for a subsequentl Medicare Annual Wellness Visit. [place] lives at home alone in a one story house with a basement. His laundry is in the basement but he doesn't have any trouble navigating the stairs. He has never been married and does not have children. He is retired from [employer].  Reports having recent labs for nephrologist but has not heard the results or recommendations.   Review of Systems  Health is about the same as last year.   Cardiac Risk Factors include: advanced age (>41men, >78 women);hypertension;male gender;dyslipidemia;obesity (BMI >30kg/m2);sedentary lifestyle  Other systems negative.    Objective:    Today's Vitals   10/17/17 1130 10/17/17 1136  BP: 120/65   Pulse: 66   Weight: 194 lb (88 kg)   Height: 5\' 7"  (1.702 m)   PainSc:  4    Body mass index is 30.38 kg/m.  Advanced Directives 10/17/2017  Does Patient Have a Medical Advance Directive? No  Would patient like information on creating a medical advance directive? No - Patient declined    Current Medications (verified) Outpatient Encounter Medications as of 10/17/2017  Medication Sig  . aspirin 81 MG chewable tablet Chew 81 mg by mouth daily.  . carvedilol (COREG) 25 MG tablet Take 12.5 mg by mouth 2 (two) times daily with a meal.  . Clobetasol Prop Emollient Base (CLOBETASOL PROPIONATE E) 0.05 % emollient cream Apply 1 Tube topically 2 (two) times daily.  . colchicine 0.6 MG tablet Take 1 tablet (0.6 mg total) by mouth daily. Take one tablet twice daily first day.  . finasteride (PROSCAR) 5 MG tablet Take 1 tablet by mouth daily.  . fluticasone (FLONASE) 50 MCG/ACT nasal spray 2 SPRAYS IN EACH NOSTRIL ONCE A DAY  . gabapentin (NEURONTIN) 300 MG capsule Take 1 capsule (300 mg total) by mouth 2 (two) times daily.  . hydroquinone 4 % cream APPLY TO AFFECTED AREAS TWICE A DAY  . lisinopril (PRINIVIL,ZESTRIL) 2.5 MG tablet TAKE 2  TABLETS ONCE DAILY AS DIRECTED  . loratadine (CLARITIN) 10 MG tablet Take 1 tablet (10 mg total) by mouth at bedtime.  . mupirocin ointment (BACTROBAN) 2 % Apply 1 application topically 2 (two) times daily. To affected areas  . pantoprazole (PROTONIX) 40 MG tablet Take 1 tablet (40 mg total) by mouth daily. For stomach  . pravastatin (PRAVACHOL) 40 MG tablet TAKE 1 TABLET ONCE A DAY  . sertraline (ZOLOFT) 50 MG tablet TAKE ONE TABLET BY MOUTH ONE TIME DAILY  . tamsulosin (FLOMAX) 0.4 MG CAPS capsule Take 2 capsules (0.8 mg total) by mouth daily.  . traMADol (ULTRAM) 50 MG tablet Take 1 tablet (50 mg total) by mouth 2 (two) times daily. For back pain   No facility-administered encounter medications on file as of 10/17/2017.     Allergies (verified) Neosporin [neomycin-bacitracin zn-polymyx] and Voltaren [diclofenac sodium]   History: Past Medical History:  Diagnosis Date  . Combined systolic and diastolic heart failure ([facility])   . Hyperlipidemia    Past Surgical History:  Procedure Laterality Date  . HERNIA REPAIR    . TONSILLECTOMY     Family History  Problem Relation Age of Onset  . Heart disease Mother   . Diabetes Mother   . Heart failure Mother   . COPD Father   . Heart attack Brother 51   Social History   Socioeconomic History  . Marital status: Single  Spouse name: Not on file  . Number of children: 0  . Years of education: some college  . Highest education level: Some college, no degree  Occupational History  . Occupation: Retired    Comment: [employer]  Social Needs  . Financial resource strain: Not hard at all  . Food insecurity:    Worry: Never true    Inability: Never true  . Transportation needs:    Medical: No    Non-medical: No  Tobacco Use  . Smoking status: Never Smoker  . Smokeless tobacco: Never Used  Substance and Sexual Activity  . Alcohol use: No    Alcohol/week: 0.0 oz  . Drug use: No  . Sexual activity: Not Currently  Lifestyle   . Physical activity:    Days per week: 0 days    Minutes per session: 0 min  . Stress: Only a little  Relationships  . Social connections:    Talks on phone: Twice a week    Gets together: Twice a week    Attends religious service: More than 4 times per year    Active member of club or organization: Yes    Attends meetings of clubs or organizations: More than 4 times per year    Relationship status: Never married  Other Topics Concern  . Not on file  Social History Narrative  . Not on file   Tobacco Counseling No tobacco use  Clinical Intake:     Pain : 0-10 Pain Score: 4  Pain Location: Generalized Pain Descriptors / Indicators: Aching Pain Onset: More than a month ago Pain Frequency: Constant Effect of Pain on Daily Activities: moderate     Nutritional Status: BMI 25 -29 Overweight Diabetes: No  What is the last grade level you completed in school?: some college  Interpreter Needed?: No  Information entered by :: Demetrios Loll, RN  Activities of Daily Living In your present state of health, do you have any difficulty performing the following activities: 10/17/2017  Hearing? N  Vision? N  Comment Last eye exam was about 3 years ago  Difficulty concentrating or making decisions? Y  Comment has noticed some decline over the past year  Walking or climbing stairs? N  Dressing or bathing? N  Doing errands, shopping? N  Preparing Food and eating ? N  Using the Toilet? N  In the past six months, have you accidently leaked urine? N  Do you have problems with loss of bowel control? N  Managing your Medications? N  Managing your Finances? N  Housekeeping or managing your Housekeeping? N  Some recent data might be hidden     Immunizations and Health Maintenance Immunization History  Administered Date(s) Administered  . Influenza,inj,Quad PF,6+ Mos 05/02/2015, 04/29/2017  . Influenza-Unspecified 04/29/2013, 05/05/2014, 04/24/2016  . Pneumococcal Conjugate-13  04/06/2016  . Pneumococcal Polysaccharide-23 04/07/2013  . Tdap 10/06/2013   Health Maintenance Due  Topic Date Due  . Hepatitis C Screening  November 21, 1945    Patient Care Team: [name], MD as PCP - General (Family Medicine) Wyline Mood, Dorothe Pea, MD as Consulting Physician (Cardiology) Salomon Mast, MD as Consulting Physician (Nephrology)  No hospitalizations, ER visits, or surgeries this past year.      Assessment:   This is a routine wellness examination for [place].  Hearing/Vision screen No deficits noted during visit.   Dietary issues and exercise activities discussed:  Diet Stays up late and sleeps late. [place] breakfast about 1130 or 1200 and then has supper later with  some snacks in between. Drinks water, deffaceinated tea and coffee  Current Exercise Habits: The patient does not participate in regular exercise at present, Exercise limited by: None identified  Goals    . Exercise 150 min/wk Moderate Activity      Depression Screen PHQ 2/9 Scores 10/17/2017 07/16/2017 06/28/2017 06/11/2017  PHQ - 2 Score 1 1 1 1   PHQ- 9 Score - - - -    Fall Risk Fall Risk  10/17/2017 07/16/2017 06/28/2017 05/20/2017 10/31/2016  Falls in the past year? No No No No No   Cognitive Function: MMSE - Mini Mental State Exam 10/17/2017  Orientation to time 5  Orientation to Place 5  Registration 3  Attention/ Calculation 5  Recall 3  Language- name 2 objects 1  Language- repeat 1  Language- follow 3 step command 3  Language- read & follow direction 1  Copy design 1        Screening Tests Health Maintenance  Topic Date Due  . Hepatitis C Screening  November 05, 1945  . TETANUS/TDAP  10/07/2023  . INFLUENZA VACCINE  Completed  . PNA vac Low Risk Adult  Completed  . COLONOSCOPY  Discontinued      Plan:  Hep C screening with next labs Contact Dr Susa Griffins office for recent lab results. Phone number provided.  Schedule eye exam. Phone number provider for Dr Rogers Blocker per  patient's request Keep appt with PCP Aim for 150 minutes of moderate activity a week  I have personally reviewed and noted the following in the patient's chart:   . Medical and social history . Use of alcohol, tobacco or illicit drugs  . Current medications and supplements . Functional ability and status . Nutritional status . Physical activity . Advanced directives . List of other physicians . Hospitalizations, surgeries, and ER visits in previous 12 months . Vitals . Screenings to include cognitive, depression, and falls . Referrals and appointments  In addition, I have reviewed and discussed with patient certain preventive protocols, quality metrics, and best practice recommendations. A written personalized care plan for preventive services as well as general preventive health recommendations were provided to patient.     Demetrios Loll, RN   10/17/2017     I have reviewed and agree with the above AWV documentation.   Rex Kras, MD [facility] 10/18/2017, 7:29 AM

## 2017-10-24 ENCOUNTER — Other Ambulatory Visit (HOSPITAL_COMMUNITY): Payer: Self-pay | Admitting: Medical

## 2017-10-24 DIAGNOSIS — N183 Chronic kidney disease, stage 3 unspecified: Secondary | ICD-10-CM

## 2017-11-06 ENCOUNTER — Other Ambulatory Visit: Payer: Medicare Other

## 2017-11-08 ENCOUNTER — Encounter: Payer: Self-pay | Admitting: Family Medicine

## 2017-11-08 ENCOUNTER — Ambulatory Visit (INDEPENDENT_AMBULATORY_CARE_PROVIDER_SITE_OTHER): Payer: Medicare Other | Admitting: Family Medicine

## 2017-11-08 VITALS — BP 128/73 | HR 69 | Temp 97.4°F | Ht 67.0 in | Wt 186.5 lb

## 2017-11-08 DIAGNOSIS — E782 Mixed hyperlipidemia: Secondary | ICD-10-CM | POA: Diagnosis not present

## 2017-11-08 DIAGNOSIS — M47816 Spondylosis without myelopathy or radiculopathy, lumbar region: Secondary | ICD-10-CM

## 2017-11-10 ENCOUNTER — Encounter: Payer: Self-pay | Admitting: Family Medicine

## 2017-11-10 DIAGNOSIS — M47816 Spondylosis without myelopathy or radiculopathy, lumbar region: Secondary | ICD-10-CM | POA: Insufficient documentation

## 2017-11-10 DIAGNOSIS — M47817 Spondylosis without myelopathy or radiculopathy, lumbosacral region: Secondary | ICD-10-CM | POA: Insufficient documentation

## 2017-11-10 MED ORDER — TRAMADOL HCL 50 MG PO TABS: 50.0000 mg | ORAL_TABLET | Freq: Two times a day (BID) | ORAL | 0 refills | Status: DC

## 2017-11-10 NOTE — Progress Notes (Signed)
Subjective:  Patient ID: Taylor Cross, male    DOB: 1945-12-06  Age: 72 y.o. MRN: [id number]  CC: Suture / Staple Removal (pt here today to have sutures removed and also wants tramadol refilled)   HPI [name] presents for removal of sutures from his right hand.  He cut it on a door frame 9 days ago.  It is itching but otherwise healing well.  He said no pain redness swelling or exudate.  Additionally the patient is out of his tramadol that he uses for the spondylosis in his back.  He is having significant low back pain he denies any radiation.  Pain at this time bilaterally in the L3-5 region rates 6/10.  He has been taking tramadol chronically for years.  In our current chart it dates back 5 years.  In that time he has tried NSAIDs without relief adequately.  He has had allergic reaction to diclofenac he is currently taking gabapentin to help decrease his need for the tramadol.  Depression screen [facility] 2/9 11/08/2017 10/17/2017 07/16/2017  Decreased Interest 0 0 0  Down, Depressed, Hopeless 0 1 1  PHQ - 2 Score 0 1 1  Altered sleeping - - -  Tired, decreased energy - - -  Change in appetite - - -  Feeling bad or failure about yourself  - - -  Trouble concentrating - - -  Moving slowly or fidgety/restless - - -  Suicidal thoughts - - -  PHQ-9 Score - - -    History Mahdi has a past medical history of Combined systolic and diastolic heart failure (HCC) and Hyperlipidemia.   He has a past surgical history that includes Tonsillectomy and Hernia repair.   His family history includes COPD in his father; Diabetes in his mother; Heart attack (age of onset: 52) in his brother; Heart disease in his mother; Heart failure in his mother.He reports that he has never smoked. He has never used smokeless tobacco. He reports that he does not drink alcohol or use drugs.    ROS Review of Systems  Constitutional: Negative for fever.  Respiratory: Negative for shortness of breath.     Cardiovascular: Negative for chest pain.  Musculoskeletal: Positive for arthralgias and back pain.  Skin: Negative for rash.    Objective:  BP 128/73   Pulse 69   Temp (!) 97.4 F (36.3 C) (Oral)   Ht 5\' 7"  (1.702 m)   Wt 186 lb 8 oz (84.6 kg)   BMI 29.21 kg/m   BP Readings from Last 3 Encounters:  11/08/17 128/73  10/17/17 120/65  09/04/17 116/72    Wt Readings from Last 3 Encounters:  11/08/17 186 lb 8 oz (84.6 kg)  10/17/17 194 lb (88 kg)  09/04/17 186 lb 3.2 oz (84.5 kg)     Physical Exam  Constitutional: He is oriented to person, place, and time. He appears well-developed and well-nourished. No distress.  HENT:  Head: Normocephalic and atraumatic.  Right Ear: External ear normal.  Left Ear: External ear normal.  Nose: Nose normal.  Mouth/Throat: Oropharynx is clear and moist.  Eyes: Pupils are equal, round, and reactive to light. Conjunctivae and EOM are normal.  Neck: Normal range of motion.  Cardiovascular: Normal rate, regular rhythm and normal heart sounds.  No murmur heard. Pulmonary/Chest: Effort normal and breath sounds normal. No respiratory distress. He has no wheezes. He has no rales.  Neurological: He is alert and oriented to person, place, and time. He has  normal reflexes.  Skin: Skin is warm and dry.  The lesion on the hand was inspected and appears to be adequately healed.  Sutures were removed without difficulty and Steri-Strips applied.  Psychiatric: He has a normal mood and affect. His behavior is normal. Judgment and thought content normal.      Assessment & Plan:   [place] was seen today for suture / staple removal.  Diagnoses and all orders for this visit:  Spondylosis of lumbar region without myelopathy or radiculopathy  Mixed hyperlipidemia -     Lipid panel  Other orders -     traMADol (ULTRAM) 50 MG tablet; Take 1 tablet (50 mg total) by mouth 2 (two) times daily. For back pain    Wound care reviewed reviewed with the  patient signs and symptoms of infection and daily care included.   I am having Franchot Gallo. [place] maintain his aspirin, tamsulosin, finasteride, Clobetasol Prop Emollient Base, mupirocin ointment, lisinopril, pantoprazole, hydroquinone, pravastatin, colchicine, carvedilol, fluticasone, gabapentin, sertraline, loratadine, and traMADol.  Allergies as of 11/08/2017      Reactions   Neosporin [neomycin-bacitracin Zn-polymyx] Rash   Voltaren [diclofenac Sodium] Other (See Comments)   GI upset      Medication List        Accurate as of 11/08/17 11:59 PM. Always use your most recent med list.          aspirin 81 MG chewable tablet Chew 81 mg by mouth daily.   carvedilol 25 MG tablet Commonly known as:  COREG Take 12.5 mg by mouth 2 (two) times daily with a meal.   Clobetasol Prop Emollient Base 0.05 % emollient cream Commonly known as:  CLOBETASOL PROPIONATE E Apply 1 Tube topically 2 (two) times daily.   colchicine 0.6 MG tablet Take 1 tablet (0.6 mg total) by mouth daily. Take one tablet twice daily first day.   finasteride 5 MG tablet Commonly known as:  PROSCAR Take 1 tablet by mouth daily.   fluticasone 50 MCG/ACT nasal spray Commonly known as:  FLONASE 2 SPRAYS IN EACH NOSTRIL ONCE A DAY   gabapentin 300 MG capsule Commonly known as:  NEURONTIN Take 1 capsule (300 mg total) by mouth 2 (two) times daily.   hydroquinone 4 % cream APPLY TO AFFECTED AREAS TWICE A DAY   lisinopril 2.5 MG tablet Commonly known as:  PRINIVIL,ZESTRIL TAKE 2 TABLETS ONCE DAILY AS DIRECTED   loratadine 10 MG tablet Commonly known as:  CLARITIN Take 1 tablet (10 mg total) by mouth at bedtime.   mupirocin ointment 2 % Commonly known as:  BACTROBAN Apply 1 application topically 2 (two) times daily. To affected areas   pantoprazole 40 MG tablet Commonly known as:  PROTONIX Take 1 tablet (40 mg total) by mouth daily. For stomach   pravastatin 40 MG tablet Commonly known as:   PRAVACHOL TAKE 1 TABLET ONCE A DAY   sertraline 50 MG tablet Commonly known as:  ZOLOFT TAKE ONE TABLET BY MOUTH ONE TIME DAILY   tamsulosin 0.4 MG Caps capsule Commonly known as:  FLOMAX Take 2 capsules (0.8 mg total) by mouth daily.   traMADol 50 MG tablet Commonly known as:  ULTRAM Take 1 tablet (50 mg total) by mouth 2 (two) times daily. For back pain        Follow-up: Return in about 1 month (around 12/08/2017).  [name], M.D.

## 2017-11-12 ENCOUNTER — Ambulatory Visit (HOSPITAL_COMMUNITY)
Admission: RE | Admit: 2017-11-12 | Discharge: 2017-11-12 | Disposition: A | Discharge status: Home / Self Care | Payer: Medicare Other | Source: Ambulatory Visit | Attending: Medical | Admitting: Medical

## 2017-11-12 DIAGNOSIS — N183 Chronic kidney disease, stage 3 unspecified: Secondary | ICD-10-CM

## 2017-11-12 DIAGNOSIS — N281 Cyst of kidney, acquired: Secondary | ICD-10-CM | POA: Diagnosis not present

## 2017-11-22 ENCOUNTER — Other Ambulatory Visit: Payer: Medicare Other

## 2017-11-26 ENCOUNTER — Other Ambulatory Visit: Payer: Self-pay | Admitting: Family Medicine

## 2017-11-28 ENCOUNTER — Telehealth: Payer: Self-pay | Admitting: Cardiology

## 2017-11-28 NOTE — Telephone Encounter (Signed)
Patient called stating that he went to see a Urologist recently and the doctor is wanting to take him off one of his heart medications.    Please call [phone].

## 2017-11-28 NOTE — Telephone Encounter (Signed)
Pt says urologist is having him hold lisinopril for 6 weeks with decreased kidney function last few months - pt will have labs done again in 6 weeks. Wanted Dr Wyline Mood made aware

## 2017-11-28 NOTE — Telephone Encounter (Signed)
Im ok with him being off lisinopril. I agree with plan.    Dominga Ferry MD

## 2017-12-09 ENCOUNTER — Other Ambulatory Visit: Payer: Self-pay | Admitting: Family Medicine

## 2017-12-10 ENCOUNTER — Ambulatory Visit: Payer: Medicare Other | Admitting: Family Medicine

## 2017-12-11 ENCOUNTER — Ambulatory Visit (INDEPENDENT_AMBULATORY_CARE_PROVIDER_SITE_OTHER): Payer: Medicare Other | Admitting: Family Medicine

## 2017-12-11 ENCOUNTER — Encounter: Payer: Self-pay | Admitting: Family Medicine

## 2017-12-11 VITALS — BP 132/68 | HR 75 | Temp 97.2°F | Ht 67.0 in | Wt 183.5 lb

## 2017-12-11 DIAGNOSIS — I10 Essential (primary) hypertension: Secondary | ICD-10-CM

## 2017-12-11 DIAGNOSIS — M47816 Spondylosis without myelopathy or radiculopathy, lumbar region: Secondary | ICD-10-CM | POA: Diagnosis not present

## 2017-12-11 DIAGNOSIS — Z1159 Encounter for screening for other viral diseases: Secondary | ICD-10-CM

## 2017-12-11 DIAGNOSIS — E782 Mixed hyperlipidemia: Secondary | ICD-10-CM

## 2017-12-11 DIAGNOSIS — M109 Gout, unspecified: Secondary | ICD-10-CM

## 2017-12-11 MED ORDER — SERTRALINE HCL 50 MG PO TABS: 50.0000 mg | ORAL_TABLET | Freq: Every day | ORAL | 1 refills | Status: DC

## 2017-12-11 MED ORDER — PANTOPRAZOLE SODIUM 40 MG PO TBEC: 40.0000 mg | DELAYED_RELEASE_TABLET | Freq: Every day | ORAL | 1 refills | Status: DC

## 2017-12-11 MED ORDER — GABAPENTIN 300 MG PO CAPS: ORAL_CAPSULE | ORAL | 1 refills | Status: DC

## 2017-12-11 MED ORDER — TRAMADOL HCL 50 MG PO TABS: 50.0000 mg | ORAL_TABLET | Freq: Two times a day (BID) | ORAL | 5 refills | Status: DC

## 2017-12-11 MED ORDER — PRAVASTATIN SODIUM 40 MG PO TABS: 40.0000 mg | ORAL_TABLET | Freq: Every day | ORAL | 1 refills | Status: DC

## 2017-12-11 NOTE — Patient Instructions (Signed)

## 2017-12-11 NOTE — Progress Notes (Signed)
Subjective:  Patient ID: Taylor Cross, male    DOB: 01-Oct-1945  Age: 72 y.o. MRN: [id number]  CC: Medical Management of Chronic Issues   HPI DENNIS HEGEMAN presents for patient in for follow-up of elevated cholesterol. Doing well without complaints on current medication. Denies side effects of statin including myalgia and arthralgia and nausea. Also in today for liver function testing. Currently no chest pain, shortness of breath or other cardiovascular related symptoms noted.  follow-up of hypertension. Patient has no history of headache chest pain or shortness of breath or recent cough. Patient also denies symptoms of TIA such as numbness weakness lateralizing. Patient checks  blood pressure at home and has not had any elevated readings recently. Patient denies side effects from his medication. States taking it regularly. Patient also is taking tramadol for lumbar spondylosis pain.  The pain has been moderate.  He states it is 3/10 most of the time.  It affects his lower back but it also affects the base of his neck posteriorly.  He is concerned that he is having pain in his feet particularly of the toes he is been told in the past it was gout and he has been placed on colchicine.  Depression screen [facility] 2/9 12/11/2017 11/08/2017 10/17/2017  Decreased Interest 0 0 0  Down, Depressed, Hopeless 0 0 1  PHQ - 2 Score 0 0 1  Altered sleeping - - -  Tired, decreased energy - - -  Change in appetite - - -  Feeling bad or failure about yourself  - - -  Trouble concentrating - - -  Moving slowly or fidgety/restless - - -  Suicidal thoughts - - -  PHQ-9 Score - - -    History Blanchard has a past medical history of Combined systolic and diastolic heart failure ([place]) and Hyperlipidemia.   He has a past surgical history that includes Tonsillectomy and Hernia repair.   His family history includes COPD in his father; Diabetes in his mother; Heart attack (age of onset: 35) in his brother; Heart disease in  his mother; Heart failure in his mother.He reports that he has never smoked. He has never used smokeless tobacco. He reports that he does not drink alcohol or use drugs.    ROS Review of Systems  Constitutional: Negative.   HENT: Negative.   Eyes: Negative for visual disturbance.  Respiratory: Negative for cough and shortness of breath.   Cardiovascular: Negative for chest pain and leg swelling.  Gastrointestinal: Negative for abdominal pain, diarrhea, nausea and vomiting.  Genitourinary: Negative for difficulty urinating.  Musculoskeletal: Positive for arthralgias (feet). Negative for myalgias.  Skin: Negative for rash.  Neurological: Negative for headaches.  Psychiatric/Behavioral: Negative for sleep disturbance.    Objective:  BP 132/68   Pulse 75   Temp (!) 97.2 F (36.2 C) (Oral)   Ht 5' 7"  (1.702 m)   Wt 183 lb 8 oz (83.2 kg)   BMI 28.74 kg/m   BP Readings from Last 3 Encounters:  12/11/17 132/68  11/08/17 128/73  10/17/17 120/65    Wt Readings from Last 3 Encounters:  12/11/17 183 lb 8 oz (83.2 kg)  11/08/17 186 lb 8 oz (84.6 kg)  10/17/17 194 lb (88 kg)     Physical Exam  Constitutional: He is oriented to person, place, and time. He appears well-developed and well-nourished. No distress.  HENT:  Head: Normocephalic and atraumatic.  Right Ear: External ear normal.  Left Ear: External ear normal.  Nose: Nose normal.  Mouth/Throat: Oropharynx is clear and moist.  Eyes: Pupils are equal, round, and reactive to light. Conjunctivae and EOM are normal.  Neck: Normal range of motion. Neck supple. No thyromegaly present.  Cardiovascular: Normal rate, regular rhythm and normal heart sounds.  No murmur heard. Pulmonary/Chest: Effort normal and breath sounds normal. No respiratory distress. He has no wheezes. He has no rales.  Abdominal: Soft. Bowel sounds are normal. He exhibits no distension. There is no tenderness.  Lymphadenopathy:    He has no cervical  adenopathy.  Neurological: He is alert and oriented to person, place, and time. He has normal reflexes.  Skin: Skin is warm and dry.  Psychiatric: He has a normal mood and affect. His behavior is normal. Judgment and thought content normal.      Assessment & Plan:   Branch was seen today for medical management of chronic issues.  Diagnoses and all orders for this visit:  Spondylosis of lumbar region without myelopathy or radiculopathy  Mixed hyperlipidemia -     CBC with Differential/Platelet -     CMP14+EGFR -     Lipid panel  Acute gout involving toe of right foot, unspecified cause -     Uric acid  Encounter for hepatitis C screening test for low risk patient -     Hepatitis C antibody  Essential hypertension  Other orders -     gabapentin (NEURONTIN) 300 MG capsule; Take 1 capsule (300 mg total) by mouth 2 (two) times daily. -     pantoprazole (PROTONIX) 40 MG tablet; Take 1 tablet (40 mg total) by mouth daily. For stomach -     pravastatin (PRAVACHOL) 40 MG tablet; Take 1 tablet (40 mg total) by mouth daily. -     sertraline (ZOLOFT) 50 MG tablet; Take 1 tablet (50 mg total) by mouth daily. -     traMADol (ULTRAM) 50 MG tablet; Take 1 tablet (50 mg total) by mouth 2 (two) times daily. For back pain       I have discontinued Suleyman D. Vanatta's lisinopril. I have also changed his pravastatin and sertraline. Additionally, I am having him maintain his aspirin, tamsulosin, finasteride, Clobetasol Prop Emollient Base, mupirocin ointment, colchicine, carvedilol, fluticasone, loratadine, hydroquinone, gabapentin, pantoprazole, and traMADol.  Allergies as of 12/11/2017      Reactions   Neosporin [neomycin-bacitracin Zn-polymyx] Rash   Voltaren [diclofenac Sodium] Other (See Comments)   GI upset      Medication List        Accurate as of 12/11/17 11:44 PM. Always use your most recent med list.          aspirin 81 MG chewable tablet Chew 81 mg by mouth daily.     carvedilol 25 MG tablet Commonly known as:  COREG Take 12.5 mg by mouth 2 (two) times daily with a meal.   Clobetasol Prop Emollient Base 0.05 % emollient cream Commonly known as:  CLOBETASOL PROPIONATE E Apply 1 Tube topically 2 (two) times daily.   colchicine 0.6 MG tablet Take 1 tablet (0.6 mg total) by mouth daily. Take one tablet twice daily first day.   finasteride 5 MG tablet Commonly known as:  PROSCAR Take 1 tablet by mouth daily.   fluticasone 50 MCG/ACT nasal spray Commonly known as:  FLONASE 2 SPRAYS IN EACH NOSTRIL ONCE A DAY   gabapentin 300 MG capsule Commonly known as:  NEURONTIN Take 1 capsule (300 mg total) by mouth 2 (two) times daily.  hydroquinone 4 % cream APPLY TO AFFECTED AREAS TWICE A DAY   loratadine 10 MG tablet Commonly known as:  CLARITIN Take 1 tablet (10 mg total) by mouth at bedtime.   mupirocin ointment 2 % Commonly known as:  BACTROBAN Apply 1 application topically 2 (two) times daily. To affected areas   pantoprazole 40 MG tablet Commonly known as:  PROTONIX Take 1 tablet (40 mg total) by mouth daily. For stomach   pravastatin 40 MG tablet Commonly known as:  PRAVACHOL Take 1 tablet (40 mg total) by mouth daily.   sertraline 50 MG tablet Commonly known as:  ZOLOFT Take 1 tablet (50 mg total) by mouth daily.   tamsulosin 0.4 MG Caps capsule Commonly known as:  FLOMAX Take 2 capsules (0.8 mg total) by mouth daily.   traMADol 50 MG tablet Commonly known as:  ULTRAM Take 1 tablet (50 mg total) by mouth 2 (two) times daily. For back pain        Follow-up: Return in about 3 months (around 03/13/2018).  Claretta Fraise, M.D.

## 2017-12-12 LAB — CBC WITH DIFFERENTIAL/PLATELET
BASOS: 0 %
Basophils Absolute: 0 10*3/uL (ref 0.0–0.2)
EOS (ABSOLUTE): 0.2 10*3/uL (ref 0.0–0.4)
EOS: 5 %
HEMATOCRIT: 39.5 % (ref 37.5–51.0)
HEMOGLOBIN: 12.6 g/dL — AB (ref 13.0–17.7)
Immature Grans (Abs): 0 10*3/uL (ref 0.0–0.1)
Immature Granulocytes: 0 %
LYMPHS ABS: 1.6 10*3/uL (ref 0.7–3.1)
Lymphs: 32 %
MCH: 29.6 pg (ref 26.6–33.0)
MCHC: 31.9 g/dL (ref 31.5–35.7)
MCV: 93 fL (ref 79–97)
MONOCYTES: 10 %
MONOS ABS: 0.5 10*3/uL (ref 0.1–0.9)
Neutrophils Absolute: 2.6 10*3/uL (ref 1.4–7.0)
Neutrophils: 53 %
Platelets: 165 10*3/uL (ref 150–379)
RBC: 4.26 x10E6/uL (ref 4.14–5.80)
RDW: 14.5 % (ref 12.3–15.4)
WBC: 4.9 10*3/uL (ref 3.4–10.8)

## 2017-12-12 LAB — LIPID PANEL
CHOLESTEROL TOTAL: 159 mg/dL (ref 100–199)
Chol/HDL Ratio: 3 ratio (ref 0.0–5.0)
HDL: 53 mg/dL (ref >39)
LDL Calculated: 89 mg/dL (ref 0–99)
Triglycerides: 86 mg/dL (ref 0–149)
VLDL CHOLESTEROL CAL: 17 mg/dL (ref 5–40)

## 2017-12-12 LAB — CMP14+EGFR
A/G RATIO: 1.9 (ref 1.2–2.2)
ALBUMIN: 4.5 g/dL (ref 3.5–4.8)
ALK PHOS: 74 IU/L (ref 39–117)
ALT: 12 IU/L (ref 0–44)
AST: 10 IU/L (ref 0–40)
BUN / CREAT RATIO: 17 (ref 10–24)
BUN: 27 mg/dL (ref 8–27)
Bilirubin Total: 0.5 mg/dL (ref 0.0–1.2)
CO2: 22 mmol/L (ref 20–29)
Calcium: 9.7 mg/dL (ref 8.6–10.2)
Chloride: 103 mmol/L (ref 96–106)
Creatinine, Ser: 1.57 mg/dL — ABNORMAL HIGH (ref 0.76–1.27)
GFR calc Af Amer: 50 mL/min/{1.73_m2} — ABNORMAL LOW (ref >59)
GFR calc non Af Amer: 43 mL/min/{1.73_m2} — ABNORMAL LOW (ref >59)
GLOBULIN, TOTAL: 2.4 g/dL (ref 1.5–4.5)
Glucose: 92 mg/dL (ref 65–99)
POTASSIUM: 5 mmol/L (ref 3.5–5.2)
Sodium: 141 mmol/L (ref 134–144)
Total Protein: 6.9 g/dL (ref 6.0–8.5)

## 2017-12-12 LAB — URIC ACID: URIC ACID: 9.1 mg/dL — AB (ref 3.7–8.6)

## 2017-12-12 LAB — HEPATITIS C ANTIBODY

## 2017-12-16 ENCOUNTER — Other Ambulatory Visit: Payer: Self-pay | Admitting: Family Medicine

## 2017-12-16 MED ORDER — ALLOPURINOL 100 MG PO TABS: 100.0000 mg | ORAL_TABLET | Freq: Every day | ORAL | 1 refills | Status: DC

## 2017-12-17 ENCOUNTER — Telehealth: Payer: Self-pay | Admitting: Family Medicine

## 2017-12-17 NOTE — Telephone Encounter (Signed)
Patient aware.

## 2017-12-25 ENCOUNTER — Encounter: Payer: Self-pay | Admitting: Pediatrics

## 2017-12-25 ENCOUNTER — Ambulatory Visit (INDEPENDENT_AMBULATORY_CARE_PROVIDER_SITE_OTHER): Payer: Medicare Other | Admitting: Pediatrics

## 2017-12-25 VITALS — BP 140/79 | HR 62 | Temp 96.8°F | Ht 67.0 in | Wt 184.8 lb

## 2017-12-25 DIAGNOSIS — L03116 Cellulitis of left lower limb: Secondary | ICD-10-CM

## 2017-12-25 MED ORDER — DOXYCYCLINE HYCLATE 100 MG PO TABS
100.0000 mg | ORAL_TABLET | Freq: Two times a day (BID) | ORAL | 0 refills | Status: DC
Start: 2017-12-25 — End: 2018-03-12

## 2017-12-25 NOTE — Progress Notes (Signed)
  Subjective:   Patient ID: Taylor Cross, male    DOB: 28-Sep-1945, 72 y.o.   MRN: [id number] CC: tick removed (Patient states that he took off 3 ticks in the shower 2 weeks ago on inner thigh)  HPI: Taylor Cross is a 72 y.o. male   Found 3 ticks he had to remove on upper legs about 2 weeks ago.  To place is healing well.  Third place with erythema, some bruising around the area.  Has not gotten any worse for the last couple days.  No fevers.  Otherwise has been feeling well.  Would like his back examined to make sure there are no [place].  No joint pains.  Appetite is been fine.  Relevant past medical, surgical, family and social history reviewed. Allergies and medications reviewed and updated. Social History   Tobacco Use  Smoking Status Never Smoker  Smokeless Tobacco Never Used   ROS: Per HPI   Objective:    BP 140/79   Pulse 62   Temp (!) 96.8 F (36 C) (Oral)   Ht 5\' 7"  (1.702 m)   Wt 184 lb 12.8 oz (83.8 kg)   BMI 28.94 kg/m   Wt Readings from Last 3 Encounters:  12/25/17 184 lb 12.8 oz (83.8 kg)  12/11/17 183 lb 8 oz (83.2 kg)  11/08/17 186 lb 8 oz (84.6 kg)    Gen: NAD, alert, cooperative with exam, NCAT EYES: EOMI, no conjunctival injection, or no icterus CV: NRRR, normal S1/S2, no murmur, distal pulses 2+ b/l Resp: CTABL, no wheezes, normal WOB Abd: +BS, soft, NTND.  Ext: No edema, warm Neuro: Alert and oriented, strength equal b/l UE and LE, coordination grossly normal Skin: 3 areas of erythema, approximately 1 to 5 cm surrounding central papule, to left upper leg, one right upper leg.  Left upper thigh with minimal induration surrounding 1 of areas.  Mildly tender to palpation.  Some purple bruising <2cm around same area.  Assessment & Plan:  Taylor Cross was seen today for tick removed.  Diagnoses and all orders for this visit:  Cellulitis of left lower extremity -     doxycycline (VIBRA-TABS) 100 MG tablet; Take 1 tablet (100 mg total) by mouth 2 (two) times  daily.   Follow up plan: Return if symptoms worsen or fail to improve. Rex Kras, MD [facility]

## 2018-01-08 ENCOUNTER — Other Ambulatory Visit: Payer: Self-pay | Admitting: Family Medicine

## 2018-01-09 ENCOUNTER — Other Ambulatory Visit: Payer: Medicare Other

## 2018-02-03 ENCOUNTER — Other Ambulatory Visit: Payer: Self-pay | Admitting: Family Medicine

## 2018-02-24 ENCOUNTER — Other Ambulatory Visit: Payer: Self-pay | Admitting: Family Medicine

## 2018-02-27 NOTE — Telephone Encounter (Signed)
Forward to PCP.

## 2018-03-06 ENCOUNTER — Ambulatory Visit: Payer: Medicare Other | Admitting: Cardiology

## 2018-03-07 ENCOUNTER — Ambulatory Visit: Payer: Medicare Other | Admitting: Cardiology

## 2018-03-12 ENCOUNTER — Ambulatory Visit (INDEPENDENT_AMBULATORY_CARE_PROVIDER_SITE_OTHER): Payer: Medicare Other | Admitting: Cardiology

## 2018-03-12 ENCOUNTER — Encounter: Payer: Self-pay | Admitting: Cardiology

## 2018-03-12 VITALS — BP 125/68 | HR 64 | Ht 68.0 in | Wt 192.8 lb

## 2018-03-12 DIAGNOSIS — N183 Chronic kidney disease, stage 3 unspecified: Secondary | ICD-10-CM

## 2018-03-12 DIAGNOSIS — I5022 Chronic systolic (congestive) heart failure: Secondary | ICD-10-CM

## 2018-03-12 DIAGNOSIS — E782 Mixed hyperlipidemia: Secondary | ICD-10-CM | POA: Diagnosis not present

## 2018-03-12 DIAGNOSIS — I10 Essential (primary) hypertension: Secondary | ICD-10-CM | POA: Diagnosis not present

## 2018-03-12 NOTE — Patient Instructions (Signed)

## 2018-03-12 NOTE — Progress Notes (Signed)
Clinical Summary Taylor Cross is a 72 y.o.male seen today for follow up of the following medical problems.   1. Chronic Systolic/Diastolic heart failure  - 10/2013 echo which showed LVEF 40-45%, mid-inferolateral wall hypokinesis, and grade II diastolic dysfunction.  - repeat echo 08/2014 LVEF 35-40%, basal to mid inferolateral and inferior walls, grade I diastolic dysfunction - completed lexiscan which showed moderate sized moderate to severe intensity inferolateral wall defect and small apical to mid inferior wall defect. Both areas were fixed and consistent with scar.     - lisinopril recently stopped by other provider due to decreasing renal function. He reports after stopping repeat labs showed his Cr has improved.  - no recent SOB/DOE, no LE edema.   2. HL - 11/2017 TC 159 TG 86 HDL 53 LDL 89 - compliant with statin   3. HTN - remains compliant with meds  4. CKD III - ACE-I recent stopped due to uptrending Cr.   Past Medical History:  Diagnosis Date  . Combined systolic and diastolic heart failure ([place])   . Hyperlipidemia      Allergies  Allergen Reactions  . Neosporin [Neomycin-Bacitracin Zn-Polymyx] Rash  . Voltaren [Diclofenac Sodium] Other (See Comments)    GI upset     Current Outpatient Medications  Medication Sig Dispense Refill  . allopurinol (ZYLOPRIM) 100 MG tablet Take 1 tablet (100 mg total) by mouth daily. 90 tablet 1  . aspirin 81 MG chewable tablet Chew 81 mg by mouth daily.    . carvedilol (COREG) 25 MG tablet Take 12.5 mg by mouth 2 (two) times daily with a meal.    . Clobetasol Prop Emollient Base (CLOBETASOL PROPIONATE E) 0.05 % emollient cream Apply 1 Tube topically 2 (two) times daily. 60 g 3  . colchicine 0.6 MG tablet Take 1 tablet (0.6 mg total) by mouth daily. Take one tablet twice daily first day. 20 tablet 2  . doxycycline (VIBRA-TABS) 100 MG tablet Take 1 tablet (100 mg total) by mouth 2 (two) times daily. 14 tablet 0  .  finasteride (PROSCAR) 5 MG tablet Take 1 tablet by mouth daily.    . fluticasone (FLONASE) 50 MCG/ACT nasal spray 2 SPRAYS IN EACH NOSTRIL ONCE A DAY 48 g 0  . gabapentin (NEURONTIN) 300 MG capsule Take 1 capsule (300 mg total) by mouth 2 (two) times daily. 180 capsule 1  . hydroquinone 4 % cream APPLY TO AFFECTED AREAS TWICE A DAY 28.35 g 0  . loratadine (CLARITIN) 10 MG tablet Take 1 tablet (10 mg total) by mouth at bedtime. 90 tablet 1  . mupirocin ointment (BACTROBAN) 2 % Apply 2 (two) times daily. To affected areas 22 g 0  . pantoprazole (PROTONIX) 40 MG tablet Take 1 tablet (40 mg total) by mouth daily. For stomach 90 tablet 1  . pravastatin (PRAVACHOL) 40 MG tablet Take 1 tablet (40 mg total) by mouth daily. 90 tablet 1  . sertraline (ZOLOFT) 50 MG tablet Take 1 tablet (50 mg total) by mouth daily. 90 tablet 1  . tamsulosin (FLOMAX) 0.4 MG CAPS capsule Take 2 capsules (0.8 mg total) by mouth daily. 60 capsule 11  . traMADol (ULTRAM) 50 MG tablet Take 1 tablet (50 mg total) by mouth 2 (two) times daily. For back pain 60 tablet 5   No current facility-administered medications for this visit.      Past Surgical History:  Procedure Laterality Date  . HERNIA REPAIR    . TONSILLECTOMY  Allergies  Allergen Reactions  . Neosporin [Neomycin-Bacitracin Zn-Polymyx] Rash  . Voltaren [Diclofenac Sodium] Other (See Comments)    GI upset      Family History  Problem Relation Age of Onset  . Heart disease Mother   . Diabetes Mother   . Heart failure Mother   . COPD Father   . Heart attack Brother 49     Social History Taylor Cross reports that he has never smoked. He has never used smokeless tobacco. Taylor Cross reports that he does not drink alcohol.   Review of Systems CONSTITUTIONAL: No weight loss, fever, chills, weakness or fatigue.  HEENT: Eyes: No visual loss, blurred vision, double vision or yellow sclerae.No hearing loss, sneezing, congestion, runny nose or sore  throat.  SKIN: No rash or itching.  CARDIOVASCULAR: per hpi RESPIRATORY: No shortness of breath, cough or sputum.  GASTROINTESTINAL: No anorexia, nausea, vomiting or diarrhea. No abdominal pain or blood.  GENITOURINARY: No burning on urination, no polyuria NEUROLOGICAL: No headache, dizziness, syncope, paralysis, ataxia, numbness or tingling in the extremities. No change in bowel or bladder control.  MUSCULOSKELETAL: No muscle, back pain, joint pain or stiffness.  LYMPHATICS: No enlarged nodes. No history of splenectomy.  PSYCHIATRIC: No history of depression or anxiety.  ENDOCRINOLOGIC: No reports of sweating, cold or heat intolerance. No polyuria or polydipsia.  Marland Kitchen   Physical Examination Vitals:   03/12/18 1054  BP: 125/68  Pulse: 64  SpO2: 98%   Vitals:   03/12/18 1054  Weight: 192 lb 12.8 oz (87.5 kg)  Height: 5' 8"  (1.727 m)    Gen: resting comfortably, no acute distress HEENT: no scleral icterus, pupils equal round and reactive, no palptable cervical adenopathy,  CV: RRR, no m/r/g, no jvd.  Resp: Clear to auscultation bilaterally GI: abdomen is soft, non-tender, non-distended, normal bowel sounds, no hepatosplenomegaly MSK: extremities are warm, no edema.  Skin: warm, no rash Neuro:  no focal deficits Psych: appropriate affect   Diagnostic Studies 10/2013 Echo Left ventricle: The cavity size was mildly dilated. Wall thickness was normal. Systolic function was mildly to moderately reduced. The estimated ejection fraction is 40%. Features are consistent with a pseudonormal left ventricular filling pattern, with concomitant abnormal relaxation and increased filling pressure (grade 2 diastolic dysfunction). Doppler parameters are consistent with high ventricular filling pressure. - Regional wall motion abnormality: Mild to moderate hypokinesis of the mid inferolateral myocardium. The remaining walls are mildly hypokinetic. - Aortic valve: Trileaflet; mildly  thickened, mildly calcified leaflets. There was no stenosis. - Mitral valve: Mildly thickened leaflets . Mild to moderate regurgitation. - Left atrium: The atrium was moderately dilated. - Tricuspid valve: Mild regurgitation. Inadequate TR jet to accurately assess pulmonary pressures. - Pericardium, extracardiac: There was a left pleural effusion.  11/11/13 Lexiscan MPI Gated imaging reveals an EDV of 223, ESV of 153, TID ratio 0.99, and LVEF of 31% with global hypokinesis, most prominent in the apex and inferolateral walls.  IMPRESSION: Intermediate risk Lexiscan Cardiolite. No diagnostic ST segment changes were noted to indicate ischemia, frequent PVCs were seen without sustained arrhythmia. Perfusion imaging is most consistent with scar affecting the inferior wall and inferolateral wall as outlined, no large ischemic zones however. LVEF is calculated at 31% with global hypokinesis that is most prominent at the apex and inferior wall. EDV indicates moderate to severe chamber dilatation. Findings are consistent with probable ischemic cardiomyopathy.   08/2014 echo Study Conclusions  - Left ventricle: The cavity size was mildly dilated. Wall thickness was  increased in a pattern of moderate LVH. Systolic function was moderately reduced. The estimated ejection fraction was in the range of 35% to 40%. There is hypokinesis to akinesis of the basal-mid inferolateral and inferior myocardium. Doppler parameters are consistent with abnormal left ventricular relaxation (grade 1 diastolic dysfunction). - Aortic valve: Mildly to moderately calcified annulus. Trileaflet. - Mitral valve: Mildly calcified annulus. There was mild regurgitation. - Left atrium: The atrium was mildly to moderately dilated. - Right atrium: Central venous pressure (est): 3 mm Hg. - Atrial septum: Possible PFO or secundum ASD with apparent left to right flow noted by color Doppler. Cannot  exclude vigorous venous return adjacent to septum. Agitated saline study could be considered for further evaluation. - Tricuspid valve: There was trivial regurgitation. - Pulmonary arteries: PA peak pressure: 17 mm Hg (S). - Pericardium, extracardiac: There was no pericardial effusion.  Recommendations: Moderate LVH with mild LV chamber dilatation and LVEF approximately 35-40%. Hypokinesis to akinesis of the mid to basal inferolateral/inferior walls noted suggestive of ischemic cardiomyopathy. Grade 1 diastolic dysfunction. Compared to prior study in April 2015, wall motion has improved somewhat and ejection fraction looks to be higher, although graded in similar range last time. Mild to moderate left atrial enlargement. Mild MAC with mild mitral regurgitation. Possible PFO or secundum ASD with apparent left to right flow noted by color Doppler. Cannot exclude vigorous venous return adjacent to septum. Agitated saline study could be considered for further evaluation. Trivial tricuspid regurgitation with PASP normal range.        Assessment and Plan  1. Chronic Systolic/diastolic heart failure -Medical therapy has been limited due to orthostatic dizziness, renal dysfunction -off ACE-I due to worsening renal function. Continue coreg. - no symptoms.   2. HTN - his bp is at goal, continue current meds  3. Hyperlipidemia -  overall at goal, conitnue statin.   4. CKD 3 - continue to monitor renal function, limit nephrotoxic drugs    F/u 6 months      Arnoldo Lenis, M.D.

## 2018-03-21 ENCOUNTER — Other Ambulatory Visit: Payer: Self-pay | Admitting: Family Medicine

## 2018-04-07 ENCOUNTER — Other Ambulatory Visit: Payer: Self-pay | Admitting: Family Medicine

## 2018-04-24 ENCOUNTER — Other Ambulatory Visit: Payer: Medicare Other

## 2018-06-10 ENCOUNTER — Ambulatory Visit: Payer: Medicare Other | Admitting: Family Medicine

## 2018-06-10 ENCOUNTER — Other Ambulatory Visit: Payer: Self-pay | Admitting: Family Medicine

## 2018-06-11 ENCOUNTER — Encounter: Payer: Self-pay | Admitting: Family Medicine

## 2018-06-11 ENCOUNTER — Ambulatory Visit: Payer: Medicare Other | Admitting: Family Medicine

## 2018-06-11 ENCOUNTER — Ambulatory Visit (INDEPENDENT_AMBULATORY_CARE_PROVIDER_SITE_OTHER): Payer: Medicare Other | Admitting: Family Medicine

## 2018-06-11 VITALS — BP 137/78 | HR 68 | Temp 97.0°F | Ht 68.0 in | Wt 187.4 lb

## 2018-06-11 DIAGNOSIS — N183 Chronic kidney disease, stage 3 unspecified: Secondary | ICD-10-CM

## 2018-06-11 DIAGNOSIS — I129 Hypertensive chronic kidney disease with stage 1 through stage 4 chronic kidney disease, or unspecified chronic kidney disease: Secondary | ICD-10-CM | POA: Diagnosis not present

## 2018-06-11 DIAGNOSIS — R351 Nocturia: Secondary | ICD-10-CM

## 2018-06-11 DIAGNOSIS — E782 Mixed hyperlipidemia: Secondary | ICD-10-CM | POA: Diagnosis not present

## 2018-06-11 DIAGNOSIS — J438 Other emphysema: Secondary | ICD-10-CM

## 2018-06-11 DIAGNOSIS — N401 Enlarged prostate with lower urinary tract symptoms: Secondary | ICD-10-CM

## 2018-06-11 MED ORDER — FINASTERIDE 5 MG PO TABS: 5.0000 mg | ORAL_TABLET | Freq: Every day | ORAL | 1 refills | Status: DC

## 2018-06-11 MED ORDER — PRAVASTATIN SODIUM 40 MG PO TABS: 40.0000 mg | ORAL_TABLET | Freq: Every day | ORAL | 1 refills | Status: DC

## 2018-06-11 MED ORDER — PANTOPRAZOLE SODIUM 40 MG PO TBEC: 40.0000 mg | DELAYED_RELEASE_TABLET | Freq: Every day | ORAL | 1 refills | Status: DC

## 2018-06-11 MED ORDER — CARVEDILOL 25 MG PO TABS: 12.5000 mg | ORAL_TABLET | Freq: Two times a day (BID) | ORAL | 1 refills | Status: DC

## 2018-06-11 MED ORDER — GABAPENTIN 300 MG PO CAPS: ORAL_CAPSULE | ORAL | 1 refills | Status: DC

## 2018-06-11 MED ORDER — ALLOPURINOL 100 MG PO TABS: 100.0000 mg | ORAL_TABLET | Freq: Every day | ORAL | 0 refills | Status: DC

## 2018-06-11 MED ORDER — FLUTICASONE PROPIONATE 50 MCG/ACT NA SUSP: NASAL | 2 refills | Status: DC

## 2018-06-11 MED ORDER — TAMSULOSIN HCL 0.4 MG PO CAPS: 0.8000 mg | ORAL_CAPSULE | Freq: Every day | ORAL | 11 refills | Status: DC

## 2018-06-11 MED ORDER — MUPIROCIN 2 % EX OINT: TOPICAL_OINTMENT | CUTANEOUS | 2 refills | Status: DC

## 2018-06-11 NOTE — Progress Notes (Signed)
Subjective:  Patient ID: Taylor Cross, male    DOB: 1946/01/17  Age: 72 y.o. MRN: [id number]  CC: Medical Management of Chronic Issues   HPI GRACEN SOUTHWELL presents for emphysema.  He also has a history of heart failure.  Both seem to be stable currently as he denies shortness of breath and edema.  His kidney failure is still present.  Although again he denies edema currently.   Follow-up of hypertension. Patient has no history of headache chest pain or shortness of breath or recent cough. Patient also denies symptoms of TIA such as numbness weakness lateralizing. Patient checks  blood pressure at home and has not had any elevated readings recently. Patient denies side effects from his medication. States taking it regularly. Patient in for follow-up of elevated cholesterol. Doing well without complaints on current medication. Denies side effects of statin including myalgia and arthralgia and nausea. Also in today for liver function testing. Currently no chest pain, shortness of breath or other cardiovascular related symptoms noted.  Depression screen [facility] 2/9 06/11/2018 12/25/2017 12/11/2017  Decreased Interest 0 0 0  Down, Depressed, Hopeless 0 0 0  PHQ - 2 Score 0 0 0  Altered sleeping - - -  Tired, decreased energy - - -  Change in appetite - - -  Feeling bad or failure about yourself  - - -  Trouble concentrating - - -  Moving slowly or fidgety/restless - - -  Suicidal thoughts - - -  PHQ-9 Score - - -    History Konstantine has a past medical history of Combined systolic and diastolic heart failure ([place]) and Hyperlipidemia.   He has a past surgical history that includes Tonsillectomy and Hernia repair.   His family history includes COPD in his father; Diabetes in his mother; Heart attack (age of onset: 70) in his brother; Heart disease in his mother; Heart failure in his mother.He reports that he has never smoked. He has never used smokeless tobacco. He reports that he does not drink  alcohol or use drugs.    ROS Review of Systems  Constitutional: Negative.   HENT: Negative.   Eyes: Negative for visual disturbance.  Respiratory: Negative for cough and shortness of breath.   Cardiovascular: Negative for chest pain and leg swelling.  Gastrointestinal: Negative for abdominal pain, diarrhea, nausea and vomiting.  Genitourinary: Negative for difficulty urinating.  Musculoskeletal: Negative for arthralgias and myalgias.  Skin: Negative for rash.  Neurological: Negative for headaches.  Psychiatric/Behavioral: Negative for sleep disturbance.    Objective:  BP 137/78   Pulse 68   Temp (!) 97 F (36.1 C) (Oral)   Ht 5' 8"  (1.727 m)   Wt 187 lb 6.4 oz (85 kg)   BMI 28.49 kg/m   BP Readings from Last 3 Encounters:  06/11/18 137/78  03/12/18 125/68  12/25/17 140/79    Wt Readings from Last 3 Encounters:  06/11/18 187 lb 6.4 oz (85 kg)  03/12/18 192 lb 12.8 oz (87.5 kg)  12/25/17 184 lb 12.8 oz (83.8 kg)     Physical Exam  Constitutional: He is oriented to person, place, and time. He appears well-developed and well-nourished. No distress.  HENT:  Head: Normocephalic and atraumatic.  Right Ear: External ear normal.  Left Ear: External ear normal.  Nose: Nose normal.  Mouth/Throat: Oropharynx is clear and moist.  Eyes: Pupils are equal, round, and reactive to light. Conjunctivae and EOM are normal.  Neck: Normal range of motion. Neck supple.  Cardiovascular: Normal rate, regular rhythm and normal heart sounds.  No murmur heard. Pulmonary/Chest: Effort normal and breath sounds normal. No respiratory distress. He has no wheezes. He has no rales.  Abdominal: Soft. There is no tenderness.  Musculoskeletal: Normal range of motion.  Neurological: He is alert and oriented to person, place, and time. He has normal reflexes.  Skin: Skin is warm and dry.  Psychiatric: He has a normal mood and affect. His behavior is normal. Judgment and thought content normal.       Assessment & Plan:   Nishant was seen today for medical management of chronic issues.  Diagnoses and all orders for this visit:  Other emphysema ([place]) -     CBC with Differential/Platelet -     CMP14+EGFR -     Lipid panel -     Uric Acid  Mixed hyperlipidemia -     CBC with Differential/Platelet -     CMP14+EGFR -     Lipid panel -     Uric Acid  Hypertensive renal disease with renal failure -     CBC with Differential/Platelet -     CMP14+EGFR -     Lipid panel -     Uric Acid  Chronic renal insufficiency, stage III (moderate) (H[facility]) -     CBC with Differential/Platelet -     CMP14+EGFR -     Lipid panel -     Uric Acid  Benign prostatic hyperplasia with nocturia -     tamsulosin (FLOMAX) 0.4 MG CAPS capsule; Take 2 capsules (0.8 mg total) by mouth daily.  Other orders -     allopurinol (ZYLOPRIM) 100 MG tablet; Take 1 tablet (100 mg total) by mouth daily. -     carvedilol (COREG) 25 MG tablet; Take 0.5 tablets (12.5 mg total) by mouth 2 (two) times daily with a meal. -     finasteride (PROSCAR) 5 MG tablet; Take 1 tablet (5 mg total) by mouth daily. -     fluticasone (FLONASE) 50 MCG/ACT nasal spray; 2 SPRAYS IN EACH NOSTRIL ONCE A DAY -     gabapentin (NEURONTIN) 300 MG capsule; Take 1 capsule (300 mg total) by mouth 2 (two) times daily. -     mupirocin ointment (BACTROBAN) 2 %; Apply 2 (two) times daily. To affected areas -     pravastatin (PRAVACHOL) 40 MG tablet; Take 1 tablet (40 mg total) by mouth daily. -     pantoprazole (PROTONIX) 40 MG tablet; Take 1 tablet (40 mg total) by mouth daily. For stomach -     traMADol (ULTRAM) 50 MG tablet; Take 1 tablet (50 mg total) by mouth 2 (two) times daily. For back pain -     sertraline (ZOLOFT) 50 MG tablet; Take 1 tablet (50 mg total) by mouth daily.       I have discontinued Almond Lint. Adachi's colchicine. I have also changed his allopurinol, carvedilol, and finasteride. Additionally, I am having him maintain  his aspirin, Clobetasol Prop Emollient Base, loratadine, hydroquinone, fluticasone, gabapentin, mupirocin ointment, pravastatin, pantoprazole, tamsulosin, traMADol, and sertraline.  Allergies as of 06/11/2018      Reactions   Neosporin [neomycin-bacitracin Zn-polymyx] Rash   Voltaren [diclofenac Sodium] Other (See Comments)   GI upset      Medication List        Accurate as of 06/11/18 11:59 PM. Always use your most recent med list.          allopurinol 100 MG tablet Commonly  known as:  ZYLOPRIM Take 1 tablet (100 mg total) by mouth daily.   aspirin 81 MG chewable tablet Chew 81 mg by mouth daily.   carvedilol 25 MG tablet Commonly known as:  COREG Take 0.5 tablets (12.5 mg total) by mouth 2 (two) times daily with a meal.   Clobetasol Prop Emollient Base 0.05 % emollient cream Apply 1 Tube topically 2 (two) times daily.   finasteride 5 MG tablet Commonly known as:  PROSCAR Take 1 tablet (5 mg total) by mouth daily.   fluticasone 50 MCG/ACT nasal spray Commonly known as:  FLONASE 2 SPRAYS IN EACH NOSTRIL ONCE A DAY   gabapentin 300 MG capsule Commonly known as:  NEURONTIN Take 1 capsule (300 mg total) by mouth 2 (two) times daily.   hydroquinone 4 % cream APPLY TO AFFECTED AREAS TWICE A DAY   loratadine 10 MG tablet Commonly known as:  CLARITIN Take 1 tablet (10 mg total) by mouth at bedtime.   mupirocin ointment 2 % Commonly known as:  BACTROBAN Apply 2 (two) times daily. To affected areas   pantoprazole 40 MG tablet Commonly known as:  PROTONIX Take 1 tablet (40 mg total) by mouth daily. For stomach   pravastatin 40 MG tablet Commonly known as:  PRAVACHOL Take 1 tablet (40 mg total) by mouth daily.   sertraline 50 MG tablet Commonly known as:  ZOLOFT Take 1 tablet (50 mg total) by mouth daily.   tamsulosin 0.4 MG Caps capsule Commonly known as:  FLOMAX Take 2 capsules (0.8 mg total) by mouth daily.   traMADol 50 MG tablet Commonly known as:   ULTRAM Take 1 tablet (50 mg total) by mouth 2 (two) times daily. For back pain        Follow-up: Return in about 6 months (around 12/10/2018).  Claretta Fraise, M.D.

## 2018-06-12 LAB — CMP14+EGFR
ALBUMIN: 4.2 g/dL (ref 3.5–4.8)
ALK PHOS: 83 IU/L (ref 39–117)
ALT: 11 IU/L (ref 0–44)
AST: 11 IU/L (ref 0–40)
Albumin/Globulin Ratio: 1.6 (ref 1.2–2.2)
BILIRUBIN TOTAL: 0.5 mg/dL (ref 0.0–1.2)
BUN / CREAT RATIO: 17 (ref 10–24)
BUN: 23 mg/dL (ref 8–27)
CHLORIDE: 102 mmol/L (ref 96–106)
CO2: 22 mmol/L (ref 20–29)
Calcium: 9.3 mg/dL (ref 8.6–10.2)
Creatinine, Ser: 1.34 mg/dL — ABNORMAL HIGH (ref 0.76–1.27)
GFR calc Af Amer: 61 mL/min/{1.73_m2} (ref >59)
GFR calc non Af Amer: 53 mL/min/{1.73_m2} — ABNORMAL LOW (ref >59)
GLOBULIN, TOTAL: 2.6 g/dL (ref 1.5–4.5)
Glucose: 88 mg/dL (ref 65–99)
Potassium: 4.8 mmol/L (ref 3.5–5.2)
SODIUM: 140 mmol/L (ref 134–144)
Total Protein: 6.8 g/dL (ref 6.0–8.5)

## 2018-06-12 LAB — CBC WITH DIFFERENTIAL/PLATELET
Basophils Absolute: 0 10*3/uL (ref 0.0–0.2)
Basos: 1 %
EOS (ABSOLUTE): 0.3 10*3/uL (ref 0.0–0.4)
EOS: 5 %
HEMATOCRIT: 38.9 % (ref 37.5–51.0)
HEMOGLOBIN: 12.7 g/dL — AB (ref 13.0–17.7)
Immature Grans (Abs): 0 10*3/uL (ref 0.0–0.1)
Immature Granulocytes: 0 %
LYMPHS ABS: 1.5 10*3/uL (ref 0.7–3.1)
Lymphs: 28 %
MCH: 28.7 pg (ref 26.6–33.0)
MCHC: 32.6 g/dL (ref 31.5–35.7)
MCV: 88 fL (ref 79–97)
MONOCYTES: 11 %
Monocytes Absolute: 0.6 10*3/uL (ref 0.1–0.9)
NEUTROS ABS: 3 10*3/uL (ref 1.4–7.0)
Neutrophils: 55 %
Platelets: 191 10*3/uL (ref 150–450)
RBC: 4.42 x10E6/uL (ref 4.14–5.80)
RDW: 13.8 % (ref 12.3–15.4)
WBC: 5.4 10*3/uL (ref 3.4–10.8)

## 2018-06-12 LAB — URIC ACID: Uric Acid: 6.5 mg/dL (ref 3.7–8.6)

## 2018-06-12 LAB — LIPID PANEL
CHOLESTEROL TOTAL: 163 mg/dL (ref 100–199)
Chol/HDL Ratio: 3.2 ratio (ref 0.0–5.0)
HDL: 51 mg/dL (ref >39)
LDL CALC: 95 mg/dL (ref 0–99)
Triglycerides: 84 mg/dL (ref 0–149)
VLDL Cholesterol Cal: 17 mg/dL (ref 5–40)

## 2018-06-20 ENCOUNTER — Other Ambulatory Visit: Payer: Self-pay | Admitting: Family Medicine

## 2018-06-20 NOTE — Telephone Encounter (Signed)
Last seen 05/2018

## 2018-06-23 ENCOUNTER — Encounter: Payer: Self-pay | Admitting: Family Medicine

## 2018-06-23 MED ORDER — SERTRALINE HCL 50 MG PO TABS: 50.0000 mg | ORAL_TABLET | Freq: Every day | ORAL | 1 refills | Status: DC

## 2018-06-23 MED ORDER — TRAMADOL HCL 50 MG PO TABS: 50.0000 mg | ORAL_TABLET | Freq: Two times a day (BID) | ORAL | 5 refills | Status: DC

## 2018-08-07 ENCOUNTER — Ambulatory Visit (INDEPENDENT_AMBULATORY_CARE_PROVIDER_SITE_OTHER): Payer: Medicare Other

## 2018-08-07 ENCOUNTER — Ambulatory Visit (INDEPENDENT_AMBULATORY_CARE_PROVIDER_SITE_OTHER): Payer: Medicare Other | Admitting: Family Medicine

## 2018-08-07 ENCOUNTER — Encounter: Payer: Self-pay | Admitting: Family Medicine

## 2018-08-07 VITALS — BP 135/86 | HR 69 | Temp 97.8°F | Ht 68.0 in | Wt 193.0 lb

## 2018-08-07 DIAGNOSIS — S99922A Unspecified injury of left foot, initial encounter: Secondary | ICD-10-CM | POA: Diagnosis not present

## 2018-08-07 NOTE — Progress Notes (Signed)
Subjective: CC:? Broken toe PCP: [name], MD Taylor Cross is a 73 y.o. male presenting to clinic today for:  1. Toe injury Patient reports injuring his left middle toe yesterday when he describes a hyperflexion of the left middle toe and subsequent bruising and blistering.  He is able to move the toe some but it is painful.  He is able to wear his shoe without much difficulty.  He denies any sensation changes in the toe.  No skin breakdown.  He wanted to get it checked out because of how purple it is.  He has been taking his normal tramadol which does seem to be helping with the pain.  Pain seems to be better than yesterday.   ROS: Per HPI  Allergies  Allergen Reactions  . Neosporin [Neomycin-Bacitracin Zn-Polymyx] Rash  . Voltaren [Diclofenac Sodium] Other (See Comments)    GI upset   Past Medical History:  Diagnosis Date  . Combined systolic and diastolic heart failure (HCC)   . Hyperlipidemia     Current Outpatient Medications:  .  allopurinol (ZYLOPRIM) 100 MG tablet, Take 1 tablet (100 mg total) by mouth daily., Disp: 90 tablet, Rfl: 0 .  aspirin 81 MG chewable tablet, Chew 81 mg by mouth daily., Disp: , Rfl:  .  carvedilol (COREG) 25 MG tablet, Take 0.5 tablets (12.5 mg total) by mouth 2 (two) times daily with a meal., Disp: 90 tablet, Rfl: 1 .  Clobetasol Prop Emollient Base (CLOBETASOL PROPIONATE E) 0.05 % emollient cream, Apply 1 Tube topically 2 (two) times daily., Disp: 60 g, Rfl: 3 .  finasteride (PROSCAR) 5 MG tablet, Take 1 tablet (5 mg total) by mouth daily., Disp: 90 tablet, Rfl: 1 .  fluticasone (FLONASE) 50 MCG/ACT nasal spray, 2 SPRAYS IN EACH NOSTRIL ONCE A DAY, Disp: 16 g, Rfl: 2 .  gabapentin (NEURONTIN) 300 MG capsule, Take 1 capsule (300 mg total) by mouth 2 (two) times daily., Disp: 180 capsule, Rfl: 1 .  hydroquinone 4 % cream, APPLY TO AFFECTED AREAS TWICE A DAY, Disp: 28.35 g, Rfl: 0 .  loratadine (CLARITIN) 10 MG tablet, Take 1 tablet (10  mg total) by mouth at bedtime., Disp: 90 tablet, Rfl: 1 .  mupirocin ointment (BACTROBAN) 2 %, Apply 2 (two) times daily. To affected areas, Disp: 22 g, Rfl: 2 .  pantoprazole (PROTONIX) 40 MG tablet, Take 1 tablet (40 mg total) by mouth daily. For stomach, Disp: 90 tablet, Rfl: 1 .  pravastatin (PRAVACHOL) 40 MG tablet, Take 1 tablet (40 mg total) by mouth daily., Disp: 90 tablet, Rfl: 1 .  sertraline (ZOLOFT) 50 MG tablet, Take 1 tablet (50 mg total) by mouth daily., Disp: 90 tablet, Rfl: 1 .  tamsulosin (FLOMAX) 0.4 MG CAPS capsule, Take 2 capsules (0.8 mg total) by mouth daily., Disp: 60 capsule, Rfl: 11 .  traMADol (ULTRAM) 50 MG tablet, Take 1 tablet (50 mg total) by mouth 2 (two) times daily. For back pain, Disp: 60 tablet, Rfl: 5 .  traMADol (ULTRAM) 50 MG tablet, TAKE 1 TABLET TWICE DAILY AS NEEDED FOR BACK PAIN, Disp: 60 tablet, Rfl: 0 Social History   Socioeconomic History  . Marital status: Single    Spouse name: Not on file  . Number of children: 0  . Years of education: some college  . Highest education level: Some college, no degree  Occupational History  . Occupation: Retired    Comment: [employer]  Social Needs  . Financial resource strain: Not  hard at all  . Food insecurity:    Worry: Never true    Inability: Never true  . Transportation needs:    Medical: No    Non-medical: No  Tobacco Use  . Smoking status: Never Smoker  . Smokeless tobacco: Never Used  Substance and Sexual Activity  . Alcohol use: No    Alcohol/week: 0.0 standard drinks  . Drug use: No  . Sexual activity: Not Currently  Lifestyle  . Physical activity:    Days per week: 0 days    Minutes per session: 0 min  . Stress: Only a little  Relationships  . Social connections:    Talks on phone: Twice a week    Gets together: Twice a week    Attends religious service: More than 4 times per year    Active member of club or organization: Yes    Attends meetings of clubs or organizations:  More than 4 times per year    Relationship status: Never married  . Intimate partner violence:    Fear of current or ex partner: Not on file    Emotionally abused: Not on file    Physically abused: Not on file    Forced sexual activity: Not on file  Other Topics Concern  . Not on file  Social History Narrative  . Not on file   Family History  Problem Relation Age of Onset  . Heart disease Mother   . Diabetes Mother   . Heart failure Mother   . COPD Father   . Heart attack Brother 81    Objective: Office vital signs reviewed. BP 135/86   Pulse 69   Temp 97.8 F (36.6 C) (Oral)   Ht 5\' 8"  (1.727 m)   Wt 193 lb (87.5 kg)   BMI 29.35 kg/m   Physical Examination:  General: Awake, alert, well nourished, No acute distress Extremities: warm, well perfused, No edema, cyanosis or clubbing; +1 pulses bilaterally MSK: normal gait and station  Left foot: Left second digit with substantial ecchymosis extending through the entire digit and an associated 3 mm blister between the MTP and DIP joint that is intact.  He is able to move the middle toe somewhat.  He certainly can move toe against resistance without substantial pain.  No visible bony deformities.  He is tender to palpation throughout the toe. Skin: Ecchymosis as above Neuro: light touch sensation in tact  Dg Toe 2nd Left  Result Date: 08/07/2018 CLINICAL DATA:  Injury.  Swelling, bruising, pain. EXAM: LEFT SECOND TOE COMPARISON:  No prior. FINDINGS: No acute bony or joint abnormality identified. No evidence of fracture dislocation. Diffuse degenerative change. Degenerative changes most prior about the first MTP joint. No radiopaque foreign body. IMPRESSION: Diffuse degenerative change. No acute abnormality. No evidence of fracture dislocation. No radiopaque foreign body. Electronically Signed   By: Maisie Fus  Register   On: 08/07/2018 16:24   Assessment/ Plan: 73 y.o. male   1. Injury of toe on left foot, initial  encounter Concern for possible fracture versus soft tissue injury.  X-ray was obtained and personal review of x-ray demonstrated quite a bit of arthritic changes in the adjacent toes but no fracture in the affected toe.  Formal review by radiologist confirms no fracture or dislocation of the toe.  I offered a walking shoe but patient declined this stating that he was doing okay with his own shoes.  Given ability to flex and extend toe against resistance unlikely to have a full  tear of the tendon.  I have recommended that he continue to elevate foot, apply ice and use pain reliever as needed as directed.  Follow-up PRN. - DG Toe 2nd Left; Future   Orders Placed This Encounter  Procedures  . DG Toe 2nd Left    Standing Status:   Future    Number of Occurrences:   1    Standing Expiration Date:   10/06/2019    Order Specific Question:   Reason for Exam (SYMPTOM  OR DIAGNOSIS REQUIRED)    Answer:   hyperflexed middle toe last night. significant bruising    Order Specific Question:   Preferred imaging location?    Answer:   Internal    Order Specific Question:   Radiology Contrast Protocol - do NOT remove file path    Answer:   [email or web address]   No orders of the defined types were placed in this encounter.    Raliegh IpAshly M Gottschalk, DO [facility] [phone]

## 2018-08-07 NOTE — Patient Instructions (Addendum)
The toe does not appear to be broken on x-ray but I am worried about possible tendon involvement.  Particularly since she had difficulty moving that toe back-and-forth.  I am putting you in a specialized shoe and placing a referral to the specialist for further evaluation.  You may continue to use the tramadol that is prescribed by your primary care doctor for pain.

## 2018-09-08 ENCOUNTER — Other Ambulatory Visit: Payer: Self-pay | Admitting: Family Medicine

## 2018-09-09 ENCOUNTER — Other Ambulatory Visit: Payer: Self-pay | Admitting: Family Medicine

## 2018-09-23 ENCOUNTER — Encounter: Payer: Self-pay | Admitting: Cardiology

## 2018-09-23 ENCOUNTER — Ambulatory Visit (INDEPENDENT_AMBULATORY_CARE_PROVIDER_SITE_OTHER): Payer: Medicare Other | Admitting: Cardiology

## 2018-09-23 VITALS — BP 134/63 | HR 60 | Ht 68.0 in | Wt 190.0 lb

## 2018-09-23 DIAGNOSIS — E782 Mixed hyperlipidemia: Secondary | ICD-10-CM | POA: Diagnosis not present

## 2018-09-23 DIAGNOSIS — N183 Chronic kidney disease, stage 3 unspecified: Secondary | ICD-10-CM

## 2018-09-23 DIAGNOSIS — I5022 Chronic systolic (congestive) heart failure: Secondary | ICD-10-CM

## 2018-09-23 DIAGNOSIS — I10 Essential (primary) hypertension: Secondary | ICD-10-CM | POA: Diagnosis not present

## 2018-09-23 NOTE — Progress Notes (Signed)
Clinical Summary Mr. Shugrue is a 73 y.o.male seen today for follow up of the following medical problems.   1. Chronic Systolic/Diastolic heart failure  - 10/2013 echo which showed LVEF 40-45%, mid-inferolateral wall hypokinesis, and grade II diastolic dysfunction.  - repeat echo 08/2014 LVEF 35-40%, basal to mid inferolateral and inferior walls, grade I diastolic dysfunction - completed lexiscan which showed moderate sized moderate to severe intensity inferolateral wall defect and small apical to mid inferior wall defect. Both areas were fixed and consistent with scar.     - lisinopril  stopped by other provider due to decreasing renal function. He reports after stopping repeat labs showed his Cr has improved.   - no recent SOB or DOE.No edema.   2. HL -he is compliant with statin  05/2018 TC 163 HDL 51 TG 84 LDL 95   3. HTN home bp's 120-130s/55-70s  4. CKD III - Cr has improved off ACE-I.   Past Medical History:  Diagnosis Date  . Combined systolic and diastolic heart failure ([facility])   . Hyperlipidemia      Allergies  Allergen Reactions  . Neosporin [Neomycin-Bacitracin Zn-Polymyx] Rash  . Voltaren [Diclofenac Sodium] Other (See Comments)    GI upset     Current Outpatient Medications  Medication Sig Dispense Refill  . allopurinol (ZYLOPRIM) 100 MG tablet Take 1 tablet (100 mg total) by mouth daily. 90 tablet 0  . aspirin 81 MG chewable tablet Chew 81 mg by mouth daily.    . carvedilol (COREG) 25 MG tablet Take 0.5 tablets (12.5 mg total) by mouth 2 (two) times daily with a meal. 90 tablet 1  . Clobetasol Prop Emollient Base (CLOBETASOL PROPIONATE E) 0.05 % emollient cream Apply 1 Tube topically 2 (two) times daily. 60 g 3  . finasteride (PROSCAR) 5 MG tablet Take 1 tablet (5 mg total) by mouth daily. 90 tablet 1  . fluticasone (FLONASE) 50 MCG/ACT nasal spray 2 SPRAYS IN EACH NOSTRIL ONCE A DAY 16 g 2  . gabapentin (NEURONTIN) 300 MG capsule Take 1  capsule (300 mg total) by mouth 2 (two) times daily. 180 capsule 1  . hydroquinone 4 % cream APPLY TO AFFECTED AREAS TWICE A DAY 28.35 g 0  . loratadine (CLARITIN) 10 MG tablet Take 1 tablet (10 mg total) by mouth at bedtime. 90 tablet 1  . mupirocin ointment (BACTROBAN) 2 % Apply 2 (two) times daily. To affected areas 22 g 2  . pantoprazole (PROTONIX) 40 MG tablet Take 1 tablet (40 mg total) by mouth daily. For stomach 90 tablet 1  . pravastatin (PRAVACHOL) 40 MG tablet Take 1 tablet (40 mg total) by mouth daily. 90 tablet 1  . sertraline (ZOLOFT) 50 MG tablet Take 1 tablet (50 mg total) by mouth daily. 90 tablet 1  . tamsulosin (FLOMAX) 0.4 MG CAPS capsule Take 2 capsules (0.8 mg total) by mouth daily. 60 capsule 11  . traMADol (ULTRAM) 50 MG tablet Take 1 tablet (50 mg total) by mouth 2 (two) times daily. For back pain 60 tablet 5  . traMADol (ULTRAM) 50 MG tablet TAKE 1 TABLET TWICE DAILY AS NEEDED FOR BACK PAIN 60 tablet 0   No current facility-administered medications for this visit.      Past Surgical History:  Procedure Laterality Date  . HERNIA REPAIR    . TONSILLECTOMY       Allergies  Allergen Reactions  . Neosporin [Neomycin-Bacitracin Zn-Polymyx] Rash  . Voltaren [Diclofenac Sodium] Other (See  Comments)    GI upset      Family History  Problem Relation Age of Onset  . Heart disease Mother   . Diabetes Mother   . Heart failure Mother   . COPD Father   . Heart attack Brother 11     Social History Mr. Grinnell reports that he has never smoked. He has never used smokeless tobacco. Mr. Wierzbicki reports no history of alcohol use.   Review of Systems CONSTITUTIONAL: No weight loss, fever, chills, weakness or fatigue.  HEENT: Eyes: No visual loss, blurred vision, double vision or yellow sclerae.No hearing loss, sneezing, congestion, runny nose or sore throat.  SKIN: No rash or itching.  CARDIOVASCULAR: per hpi RESPIRATORY: No shortness of breath, cough or sputum.    GASTROINTESTINAL: No anorexia, nausea, vomiting or diarrhea. No abdominal pain or blood.  GENITOURINARY: No burning on urination, no polyuria NEUROLOGICAL: No headache, dizziness, syncope, paralysis, ataxia, numbness or tingling in the extremities. No change in bowel or bladder control.  MUSCULOSKELETAL: No muscle, back pain, joint pain or stiffness.  LYMPHATICS: No enlarged nodes. No history of splenectomy.  PSYCHIATRIC: No history of depression or anxiety.  ENDOCRINOLOGIC: No reports of sweating, cold or heat intolerance. No polyuria or polydipsia.  Marland Kitchen   Physical Examination Vitals:   09/23/18 1542  BP: 134/63  Pulse: 60  SpO2: 98%   Vitals:   09/23/18 1542  Weight: 190 lb (86.2 kg)  Height: 5' 8"  (1.727 m)    Gen: resting comfortably, no acute distress HEENT: no scleral icterus, pupils equal round and reactive, no palptable cervical adenopathy,  CV: RRR, no m/r/g, no jvd Resp: Clear to auscultation bilaterally GI: abdomen is soft, non-tender, non-distended, normal bowel sounds, no hepatosplenomegaly MSK: extremities are warm, no edema.  Skin: warm, no rash Neuro:  no focal deficits Psych: appropriate affect   Diagnostic Studies 10/2013 Echo Left ventricle: The cavity size was mildly dilated. Wall thickness was normal. Systolic function was mildly to moderately reduced. The estimated ejection fraction is 40%. Features are consistent with a pseudonormal left ventricular filling pattern, with concomitant abnormal relaxation and increased filling pressure (grade 2 diastolic dysfunction). Doppler parameters are consistent with high ventricular filling pressure. - Regional wall motion abnormality: Mild to moderate hypokinesis of the mid inferolateral myocardium. The remaining walls are mildly hypokinetic. - Aortic valve: Trileaflet; mildly thickened, mildly calcified leaflets. There was no stenosis. - Mitral valve: Mildly thickened leaflets . Mild to  moderate regurgitation. - Left atrium: The atrium was moderately dilated. - Tricuspid valve: Mild regurgitation. Inadequate TR jet to accurately assess pulmonary pressures. - Pericardium, extracardiac: There was a left pleural effusion.  11/11/13 Lexiscan MPI Gated imaging reveals an EDV of 223, ESV of 153, TID ratio 0.99, and LVEF of 31% with global hypokinesis, most prominent in the apex and inferolateral walls.  IMPRESSION: Intermediate risk Lexiscan Cardiolite. No diagnostic ST segment changes were noted to indicate ischemia, frequent PVCs were seen without sustained arrhythmia. Perfusion imaging is most consistent with scar affecting the inferior wall and inferolateral wall as outlined, no large ischemic zones however. LVEF is calculated at 31% with global hypokinesis that is most prominent at the apex and inferior wall. EDV indicates moderate to severe chamber dilatation. Findings are consistent with probable ischemic cardiomyopathy.   08/2014 echo Study Conclusions  - Left ventricle: The cavity size was mildly dilated. Wall thickness was increased in a pattern of moderate LVH. Systolic function was moderately reduced. The estimated ejection fraction was in the range  of 35% to 40%. There is hypokinesis to akinesis of the basal-mid inferolateral and inferior myocardium. Doppler parameters are consistent with abnormal left ventricular relaxation (grade 1 diastolic dysfunction). - Aortic valve: Mildly to moderately calcified annulus. Trileaflet. - Mitral valve: Mildly calcified annulus. There was mild regurgitation. - Left atrium: The atrium was mildly to moderately dilated. - Right atrium: Central venous pressure (est): 3 mm Hg. - Atrial septum: Possible PFO or secundum ASD with apparent left to right flow noted by color Doppler. Cannot exclude vigorous venous return adjacent to septum. Agitated saline study could be considered for further  evaluation. - Tricuspid valve: There was trivial regurgitation. - Pulmonary arteries: PA peak pressure: 17 mm Hg (S). - Pericardium, extracardiac: There was no pericardial effusion.  Recommendations: Moderate LVH with mild LV chamber dilatation and LVEF approximately 35-40%. Hypokinesis to akinesis of the mid to basal inferolateral/inferior walls noted suggestive of ischemic cardiomyopathy. Grade 1 diastolic dysfunction. Compared to prior study in April 2015, wall motion has improved somewhat and ejection fraction looks to be higher, although graded in similar range last time. Mild to moderate left atrial enlargement. Mild MAC with mild mitral regurgitation. Possible PFO or secundum ASD with apparent left to right flow noted by color Doppler. Cannot exclude vigorous venous return adjacent to septum. Agitated saline study could be considered for further evaluation. Trivial tricuspid regurgitation with PASP normal range.        Assessment and Plan  1. Chronic Systolic/diastolic heart failure -Medical therapy has been limited due to orthostatic dizziness, renal dysfunction -last echo 4 years ago, repeat echo to reevaluate LV function.   _ EKG today shows SR, some occasional PVCs 2. HTN -at goal, continue current meds  3. Hyperlipidemia - at goal, cotninue statin  4. CKD 3 - labs show improving Cr, continue to monitor.       Arnoldo Lenis, M.D.

## 2018-09-23 NOTE — Patient Instructions (Signed)
Your physician wants you to follow-up in: 6 MONTHS WITH DR. BRANCH You will receive a reminder letter in the mail two months in advance. If you don't receive a letter, please call our office to schedule the follow-up appointment.  Your physician recommends that you continue on your current medications as directed. Please refer to the Current Medication list given to you today.  Your physician has requested that you have an echocardiogram. Echocardiography is a painless test that uses sound waves to create images of your heart. It provides your doctor with information about the size and shape of your heart and how well your heart's chambers and valves are working. This procedure takes approximately one hour. There are no restrictions for this procedure.  Thank you for choosing [facility]!!   

## 2018-10-22 ENCOUNTER — Other Ambulatory Visit: Payer: Medicare Other

## 2018-11-10 ENCOUNTER — Other Ambulatory Visit: Payer: Self-pay | Admitting: Family Medicine

## 2018-12-04 ENCOUNTER — Other Ambulatory Visit: Payer: Self-pay | Admitting: Family Medicine

## 2018-12-08 ENCOUNTER — Other Ambulatory Visit: Payer: Self-pay | Admitting: Family Medicine

## 2018-12-10 ENCOUNTER — Other Ambulatory Visit: Payer: Self-pay

## 2018-12-10 ENCOUNTER — Ambulatory Visit (INDEPENDENT_AMBULATORY_CARE_PROVIDER_SITE_OTHER): Payer: Medicare Other | Admitting: Family Medicine

## 2018-12-10 ENCOUNTER — Encounter: Payer: Self-pay | Admitting: Family Medicine

## 2018-12-10 ENCOUNTER — Ambulatory Visit: Payer: Self-pay | Admitting: Family Medicine

## 2018-12-10 DIAGNOSIS — K2100 Gastro-esophageal reflux disease with esophagitis, without bleeding: Secondary | ICD-10-CM

## 2018-12-10 DIAGNOSIS — R351 Nocturia: Secondary | ICD-10-CM

## 2018-12-10 DIAGNOSIS — I10 Essential (primary) hypertension: Secondary | ICD-10-CM

## 2018-12-10 DIAGNOSIS — N401 Enlarged prostate with lower urinary tract symptoms: Secondary | ICD-10-CM | POA: Diagnosis not present

## 2018-12-10 DIAGNOSIS — E782 Mixed hyperlipidemia: Secondary | ICD-10-CM

## 2018-12-10 DIAGNOSIS — M47816 Spondylosis without myelopathy or radiculopathy, lumbar region: Secondary | ICD-10-CM

## 2018-12-10 MED ORDER — TAMSULOSIN HCL 0.4 MG PO CAPS: 0.8000 mg | ORAL_CAPSULE | Freq: Every day | ORAL | 11 refills | Status: DC

## 2018-12-10 MED ORDER — ALLOPURINOL 100 MG PO TABS: 100.0000 mg | ORAL_TABLET | Freq: Every day | ORAL | 1 refills | Status: DC

## 2018-12-10 MED ORDER — PRAVASTATIN SODIUM 40 MG PO TABS: 40.0000 mg | ORAL_TABLET | Freq: Every day | ORAL | 1 refills | Status: DC

## 2018-12-10 MED ORDER — PANTOPRAZOLE SODIUM 40 MG PO TBEC: 40.0000 mg | DELAYED_RELEASE_TABLET | Freq: Every day | ORAL | 1 refills | Status: DC

## 2018-12-10 MED ORDER — FLUTICASONE PROPIONATE 50 MCG/ACT NA SUSP: NASAL | 11 refills | Status: DC

## 2018-12-10 MED ORDER — CARVEDILOL 25 MG PO TABS: 12.5000 mg | ORAL_TABLET | Freq: Two times a day (BID) | ORAL | 1 refills | Status: DC

## 2018-12-10 MED ORDER — FINASTERIDE 5 MG PO TABS: 5.0000 mg | ORAL_TABLET | Freq: Every day | ORAL | 1 refills | Status: DC

## 2018-12-10 MED ORDER — GABAPENTIN 300 MG PO CAPS: ORAL_CAPSULE | ORAL | 1 refills | Status: DC

## 2018-12-10 MED ORDER — TRAMADOL HCL 50 MG PO TABS: 50.0000 mg | ORAL_TABLET | Freq: Two times a day (BID) | ORAL | 5 refills | Status: DC

## 2018-12-10 MED ORDER — SERTRALINE HCL 50 MG PO TABS: 50.0000 mg | ORAL_TABLET | Freq: Every day | ORAL | 1 refills | Status: DC

## 2018-12-10 NOTE — Progress Notes (Signed)
Subjective:    Patient ID: Taylor Cross, male    DOB: 10-31-1945, 73 y.o.   MRN: [id number]   HPI: Taylor Cross is a 73 y.o. male presenting for six month follow p of chronic conditions. Concerned about kidney function. Kidney doctor was pleased with test result last time (eGFR). Supposed to go back in July.   Pt. Denies swelling.   Having arthritis pain. Gabapentin and tramdol BID do help. Makes symptoms tolerable. Multiple joints involved, but lumbar spondylosis is the primary source of pain.  Affected. Pain 2-3/10 with meds.MME =10. PDMP shows good compliance without discrepancy Home vitals today: Bp 122/68 HR 68 T 97.0  Patient in for follow-up of GERD. Currently asymptomatic taking  PPI daily. There is no chest pain or heartburn. No hematemesis and no melena. No dysphagia or choking. Onset is remote. Progression is stable. Complicating factors, none.  Follow-up of hypertension. Patient has no history of headache chest pain or shortness of breath or recent cough. Patient also denies symptoms of TIA such as numbness weakness lateralizing. Patient checks  blood pressure at home and has not had any elevated readings recently. Patient denies side effects from his medication. States taking it regularly. Patient in for follow-up of elevated cholesterol. Doing well without complaints on current medication. Denies side effects of statin including myalgia and arthralgia and nausea. Also in today for liver function testing. Currently no chest pain, shortness of breath or other cardiovascular related symptoms noted.   Depression screen [facility] 2/9 06/11/2018 12/25/2017 12/11/2017 11/08/2017 10/17/2017  Decreased Interest 0 0 0 0 0  Down, Depressed, Hopeless 0 0 0 0 1  PHQ - 2 Score 0 0 0 0 1  Altered sleeping - - - - -  Tired, decreased energy - - - - -  Change in appetite - - - - -  Feeling bad or failure about yourself  - - - - -  Trouble concentrating - - - - -  Moving slowly or  fidgety/restless - - - - -  Suicidal thoughts - - - - -  PHQ-9 Score - - - - -     Relevant past medical, surgical, family and social history reviewed and updated as indicated.  Interim medical history since our last visit reviewed. Allergies and medications reviewed and updated.  ROS:  Review of Systems  Constitutional: Negative.   HENT: Negative.   Eyes: Negative for visual disturbance.  Respiratory: Negative for cough and shortness of breath.   Cardiovascular: Negative for chest pain and leg swelling.  Gastrointestinal: Negative for abdominal pain, diarrhea, nausea and vomiting.  Genitourinary: Negative for difficulty urinating.  Musculoskeletal: Positive for arthralgias and back pain. Negative for myalgias.  Skin: Negative for rash.  Neurological: Negative for headaches.  Psychiatric/Behavioral: Negative for sleep disturbance.     Social History   Tobacco Use  Smoking Status Never Smoker  Smokeless Tobacco Never Used       Objective:     Wt Readings from Last 3 Encounters:  09/23/18 190 lb (86.2 kg)  08/07/18 193 lb (87.5 kg)  06/11/18 187 lb 6.4 oz (85 kg)     Exam deferred. Pt. Harboring due to COVID 19. Phone visit performed.   Assessment & Plan:   1. Essential hypertension   2. Benign prostatic hyperplasia with nocturia   3. Gastroesophageal reflux disease with esophagitis   4. Mixed hyperlipidemia   5. Spondylosis of lumbar region without myelopathy or radiculopathy     Meds  ordered this encounter  Medications  . pravastatin (PRAVACHOL) 40 MG tablet    Sig: Take 1 tablet (40 mg total) by mouth daily.    Dispense:  90 tablet    Refill:  1  . finasteride (PROSCAR) 5 MG tablet    Sig: Take 1 tablet (5 mg total) by mouth daily.    Dispense:  90 tablet    Refill:  1  . tamsulosin (FLOMAX) 0.4 MG CAPS capsule    Sig: Take 2 capsules (0.8 mg total) by mouth daily.    Dispense:  60 capsule    Refill:  11  . traMADol (ULTRAM) 50 MG tablet    Sig:  Take 1 tablet (50 mg total) by mouth 2 (two) times daily. For back pain    Dispense:  60 tablet    Refill:  5  . sertraline (ZOLOFT) 50 MG tablet    Sig: Take 1 tablet (50 mg total) by mouth daily.    Dispense:  90 tablet    Refill:  1  . pantoprazole (PROTONIX) 40 MG tablet    Sig: Take 1 tablet (40 mg total) by mouth daily. For stomach    Dispense:  90 tablet    Refill:  1  . fluticasone (FLONASE) 50 MCG/ACT nasal spray    Sig: 2 spray daily each day each nostril    Dispense:  16 g    Refill:  11  . carvedilol (COREG) 25 MG tablet    Sig: Take 0.5 tablets (12.5 mg total) by mouth 2 (two) times daily with a meal.    Dispense:  90 tablet    Refill:  1  . allopurinol (ZYLOPRIM) 100 MG tablet    Sig: Take 1 tablet (100 mg total) by mouth daily.    Dispense:  90 tablet    Refill:  1  . gabapentin (NEURONTIN) 300 MG capsule    Sig: Take 1 capsule (300 mg total) by mouth 2 (two) times daily.    Dispense:  180 capsule    Refill:  1    No orders of the defined types were placed in this encounter.     Diagnoses and all orders for this visit:  Essential hypertension  Benign prostatic hyperplasia with nocturia -     tamsulosin (FLOMAX) 0.4 MG CAPS capsule; Take 2 capsules (0.8 mg total) by mouth daily.  Gastroesophageal reflux disease with esophagitis  Mixed hyperlipidemia  Spondylosis of lumbar region without myelopathy or radiculopathy  Other orders -     pravastatin (PRAVACHOL) 40 MG tablet; Take 1 tablet (40 mg total) by mouth daily. -     finasteride (PROSCAR) 5 MG tablet; Take 1 tablet (5 mg total) by mouth daily. -     traMADol (ULTRAM) 50 MG tablet; Take 1 tablet (50 mg total) by mouth 2 (two) times daily. For back pain -     sertraline (ZOLOFT) 50 MG tablet; Take 1 tablet (50 mg total) by mouth daily. -     pantoprazole (PROTONIX) 40 MG tablet; Take 1 tablet (40 mg total) by mouth daily. For stomach -     fluticasone (FLONASE) 50 MCG/ACT nasal spray; 2 spray daily  each day each nostril -     carvedilol (COREG) 25 MG tablet; Take 0.5 tablets (12.5 mg total) by mouth 2 (two) times daily with a meal. -     allopurinol (ZYLOPRIM) 100 MG tablet; Take 1 tablet (100 mg total) by mouth daily. -     gabapentin (  NEURONTIN) 300 MG capsule; Take 1 capsule (300 mg total) by mouth 2 (two) times daily.    Virtual Visit via telephone Note  I discussed the limitations, risks, security and privacy concerns of performing an evaluation and management service by telephone and the availability of in person appointments. The patient was identified with two identifiers. Pt.expressed understanding and agreed to proceed. Pt. Is at home. Dr. Livia Snellen is in his office.  Follow Up Instructions:   I discussed the assessment and treatment plan with the patient. The patient was provided an opportunity to ask questions and all were answered. The patient agreed with the plan and demonstrated an understanding of the instructions.   The patient was advised to call back or seek an in-person evaluation if the symptoms worsen or if the condition fails to improve as anticipated.   Total minutes including chart review and phone contact time: 25   Follow up plan: Return in about 6 months (around 06/12/2019), or if symptoms worsen or fail to improve.  Claretta Fraise, MD [place]

## 2019-01-01 IMAGING — US US RENAL
1 series · 14 of 25 positions shown · non-contrast
Comparison: None.

CLINICAL DATA: 72-year-old male with chronic kidney disease stage
3. Initial encounter.

EXAM:
RENAL / URINARY TRACT ULTRASOUND COMPLETE

[Series 1: us renal · 0.22mm/px · 14 of 51 slices shown]
[im 1/51]
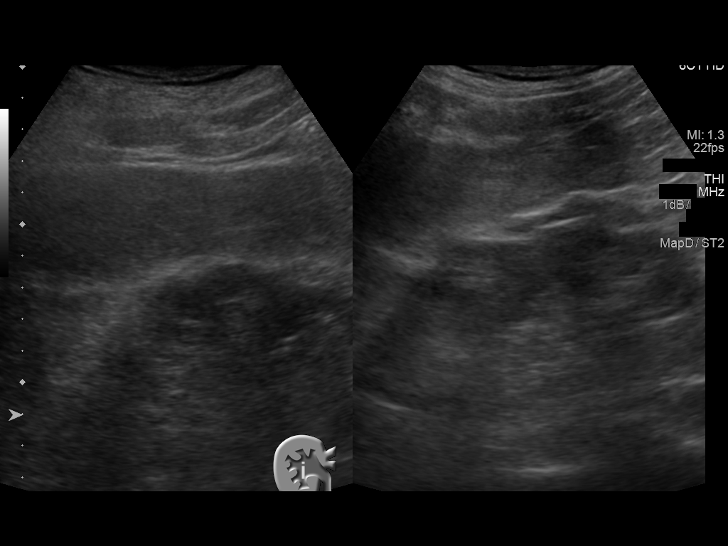
[im 5/51]
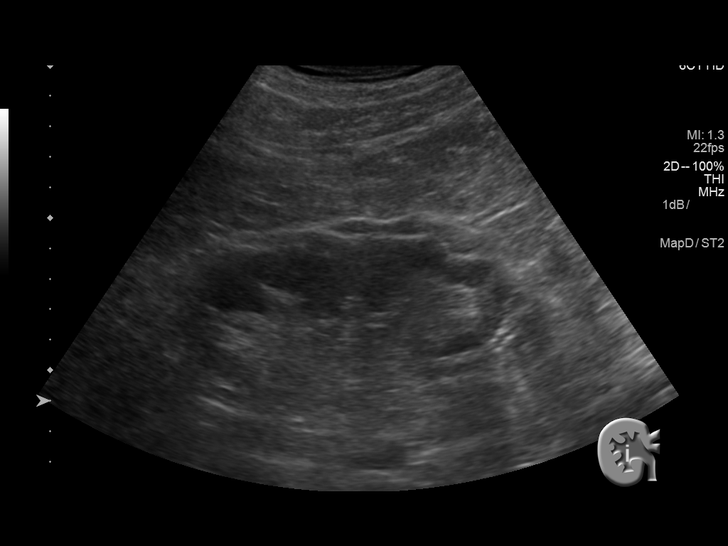
[im 9/51]
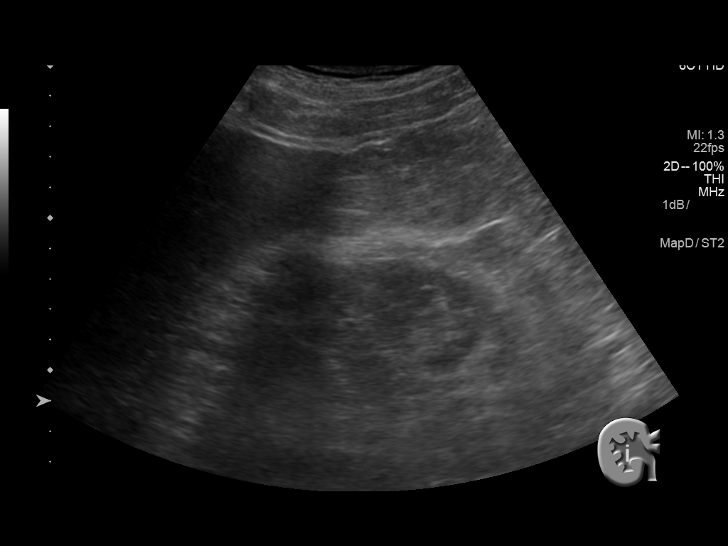
[im 13/51]
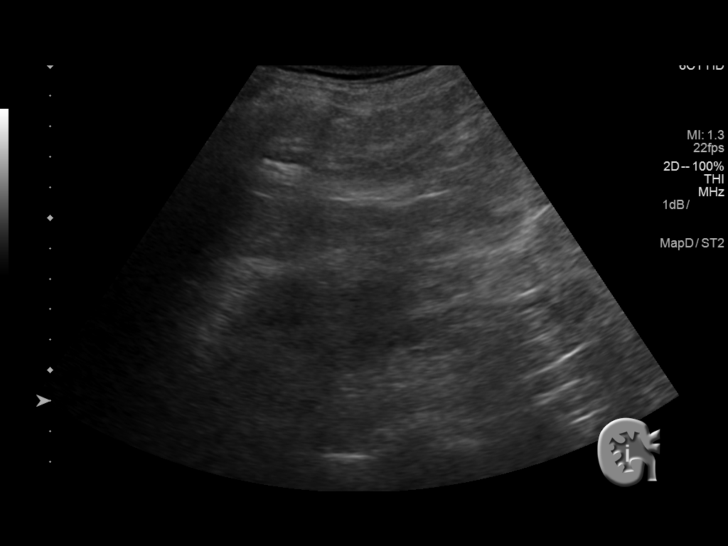
[im 17/51]
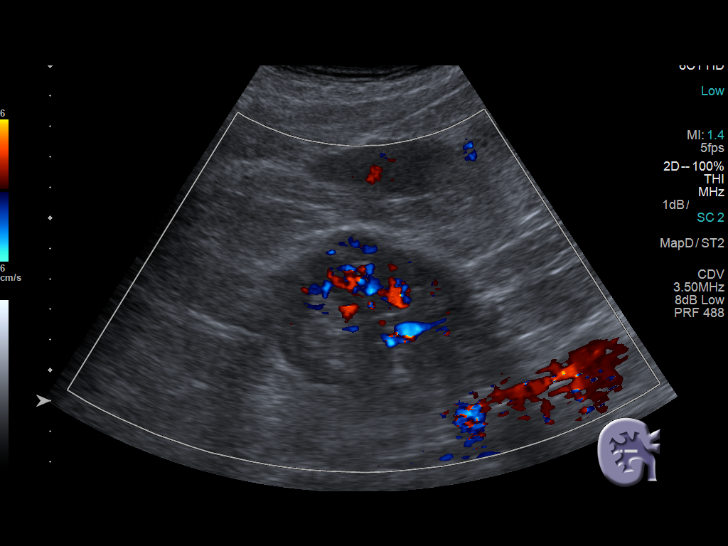
[im 19/51]
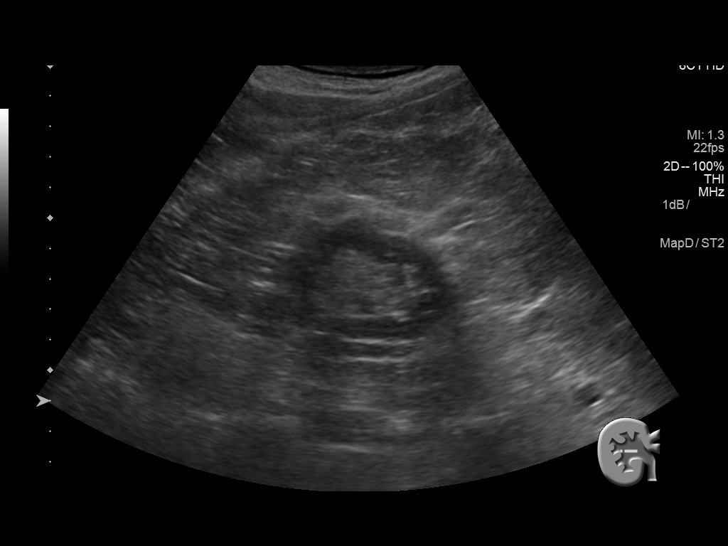
[im 23/51]
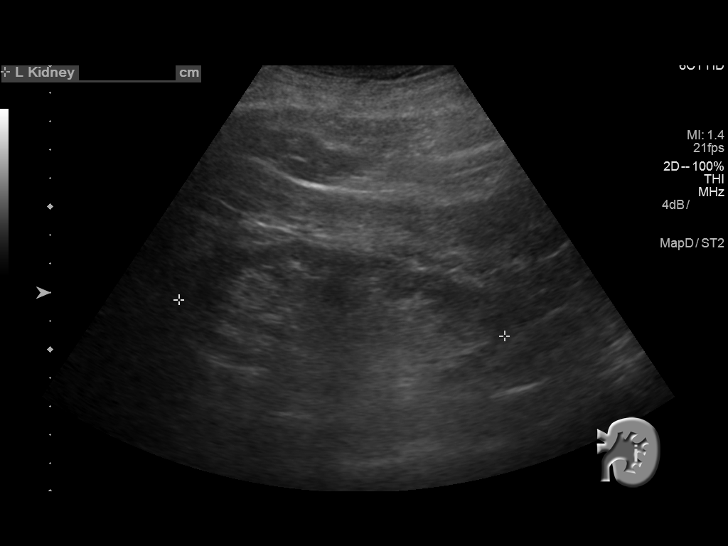
[im 28/51]
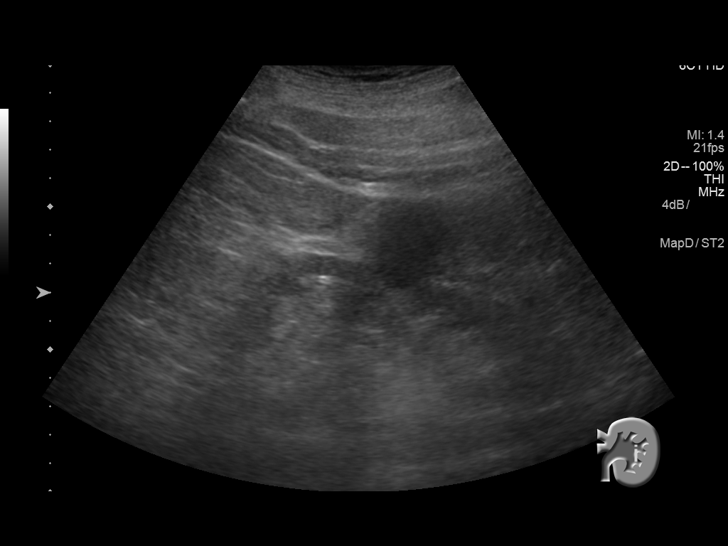
[im 32/51]
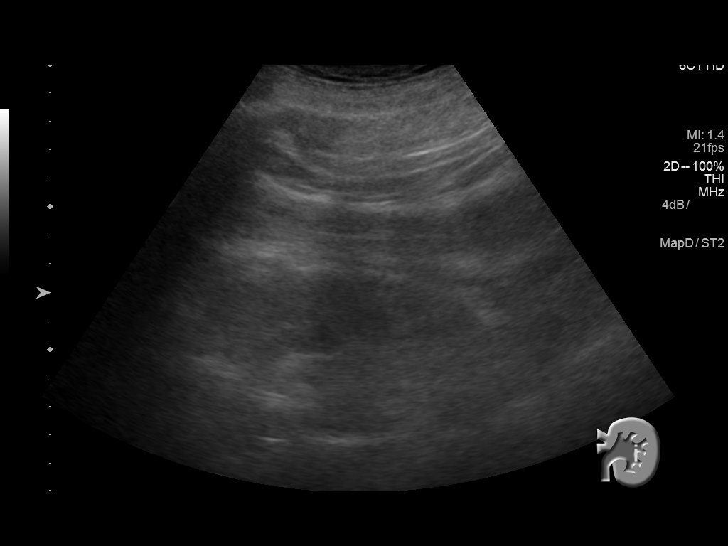
[im 34/51]
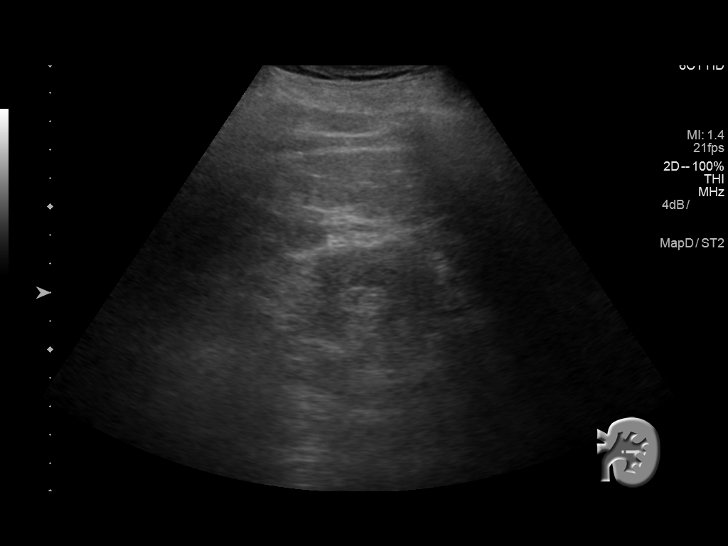
[im 38/51]
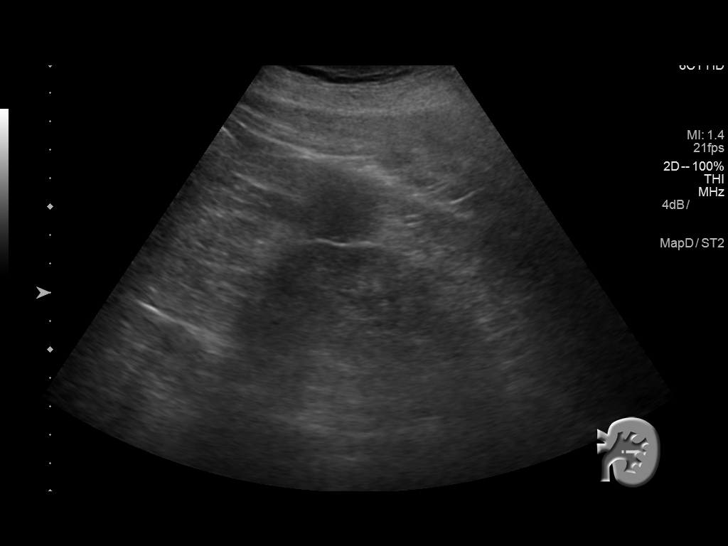
[im 42/51]
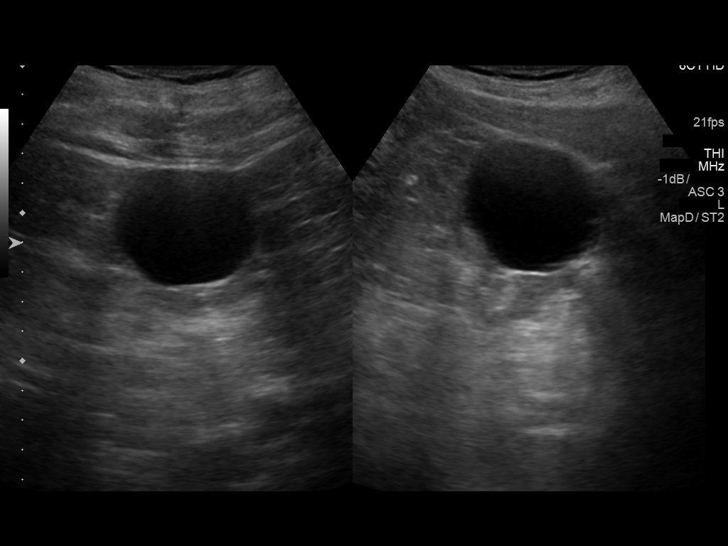
[im 46/51]
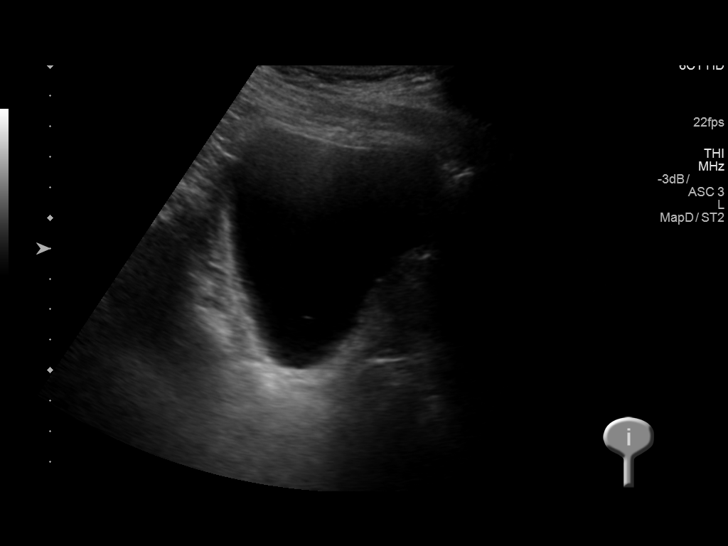
[im 51/51]
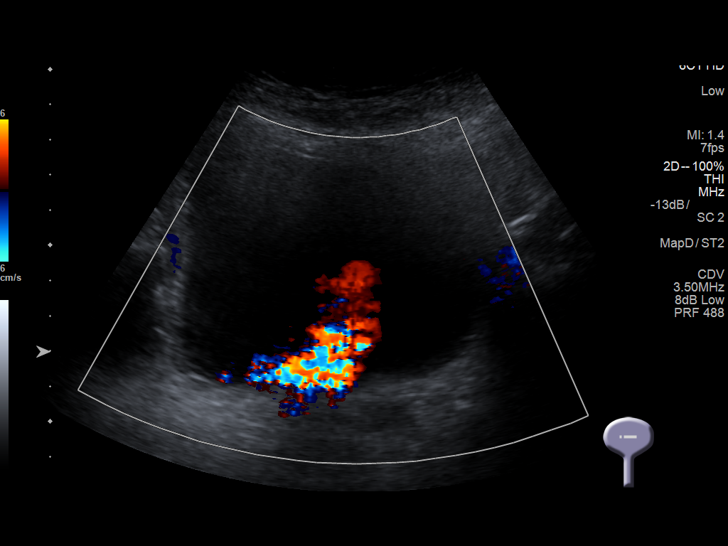

[14 of 25 positions shown; findings below may reference images not displayed]

FINDINGS: Right Kidney:

Length: 11.8 cm. Mild increased echogenicity. Mild renal parenchymal
thinning. No hydronephrosis or mass.

Left Kidney:

Length: 11.5 cm. Mild increased echogenicity. Renal parenchymal
thinning. No hydronephrosis. Lower pole 5 x 4.1 x 4.8 cm cyst.

Bladder:

Appears normal for degree of bladder distention. Bilateral ureteral
jets noted.
IMPRESSION: No hydronephrosis.

Increased renal parenchyma echogenicity with renal parenchymal
thinning greater on the left consistent with changes of medical
renal disease.

Left lower pole 5 cm cyst.

## 2019-01-05 ENCOUNTER — Other Ambulatory Visit: Payer: Self-pay

## 2019-01-06 ENCOUNTER — Ambulatory Visit (INDEPENDENT_AMBULATORY_CARE_PROVIDER_SITE_OTHER): Payer: Medicare Other | Admitting: Family Medicine

## 2019-01-06 ENCOUNTER — Encounter: Payer: Self-pay | Admitting: Family Medicine

## 2019-01-06 VITALS — BP 170/89 | HR 69 | Temp 98.3°F | Ht 68.0 in | Wt 191.0 lb

## 2019-01-06 DIAGNOSIS — I10 Essential (primary) hypertension: Secondary | ICD-10-CM

## 2019-01-06 DIAGNOSIS — E782 Mixed hyperlipidemia: Secondary | ICD-10-CM

## 2019-01-06 DIAGNOSIS — W57XXXA Bitten or stung by nonvenomous insect and other nonvenomous arthropods, initial encounter: Secondary | ICD-10-CM

## 2019-01-06 LAB — LIPID PANEL

## 2019-01-06 MED ORDER — DOXYCYCLINE HYCLATE 100 MG PO CAPS: 100.0000 mg | ORAL_CAPSULE | Freq: Two times a day (BID) | ORAL | 0 refills | Status: DC

## 2019-01-06 NOTE — Progress Notes (Signed)
Chief Complaint  Patient presents with  . Tick Removal    back - x 3 weeks    HPI  Patient presents today for itching and burning on his back present for a couple of weeks.  It is been in the same spot in the left scapular region.  He looked at it on his back in a meter and saw dark spot he was not sure what it was.  No fever chills or sweats.  No rash other than some redness surrounding the dark spot.  He could not reach it because of its location.  Patient requested blood work related to his chronic elevated blood pressure and cholesterol today as well.   Follow-up of hypertension. Patient has no history of headache chest pain or shortness of breath or recent cough. Patient also denies symptoms of TIA such as numbness weakness lateralizing. Patient checks  blood pressure at home and has not had any elevated readings recently. Patient denies side effects from his medication. States taking it regularly.  Patient in for follow-up of elevated cholesterol. Doing well without complaints on current medication. Denies side effects of statin including myalgia and arthralgia and nausea. Also in today for liver function testing. Currently no chest pain, shortness of breath or other cardiovascular related symptoms noted.  Patient in for follow-up of GERD. Currently asymptomatic taking  PPI daily. There is no chest pain or heartburn. No hematemesis and no melena. No dysphagia or choking. Onset is remote. Progression is stable. Complicating factors, none. Tick bite   PMH: Smoking status noted ROS: Per HPI  Objective: BP (!) 170/89   Pulse 69   Temp 98.3 F (36.8 C) (Oral)   Ht 5' 8"  (1.727 m)   Wt 191 lb (86.6 kg)   BMI 29.04 kg/m  Gen: NAD, alert, cooperative with exam HEENT: NCAT, EOMI, PERRL CV: RRR, good S1/S2, no murmur Resp: CTABL, no wheezes, non-labored A skin: There is a tick that is engorged to about 5 mm attached to the skin with a 4 inch surrounding erythema.  There is no erythema  margin not him.  The tick was removed with forceps.  The wound cleansed and Neosporin and a Band-Aid were applied. Ext: No edema, warm Neuro: Alert and oriented, No gross deficits  Assessment and plan:  1. Essential hypertension   2. Mixed hyperlipidemia   3. Tick bite, initial encounter     Meds ordered this encounter  Medications  . doxycycline (VIBRAMYCIN) 100 MG capsule    Sig: Take 1 capsule (100 mg total) by mouth 2 (two) times daily.    Dispense:  20 capsule    Refill:  0    Orders Placed This Encounter  Procedures  . CBC with Differential/Platelet  . CMP14+EGFR  . Lipid panel   Patient was encouraged to use deep [place] off on a regular basis anytime he is in an area where he might be exposed. Follow up as needed.  Claretta Fraise, MD

## 2019-01-07 LAB — CMP14+EGFR
ALT: 12 IU/L (ref 0–44)
AST: 14 IU/L (ref 0–40)
Albumin/Globulin Ratio: 1.7 (ref 1.2–2.2)
Albumin: 4.3 g/dL (ref 3.7–4.7)
Alkaline Phosphatase: 75 IU/L (ref 39–117)
BUN/Creatinine Ratio: 24 (ref 10–24)
BUN: 34 mg/dL — ABNORMAL HIGH (ref 8–27)
Bilirubin Total: 0.3 mg/dL (ref 0.0–1.2)
CO2: 23 mmol/L (ref 20–29)
Calcium: 9.2 mg/dL (ref 8.6–10.2)
Chloride: 103 mmol/L (ref 96–106)
Creatinine, Ser: 1.43 mg/dL — ABNORMAL HIGH (ref 0.76–1.27)
GFR calc Af Amer: 56 mL/min/{1.73_m2} — ABNORMAL LOW (ref >59)
GFR calc non Af Amer: 48 mL/min/{1.73_m2} — ABNORMAL LOW (ref >59)
Globulin, Total: 2.5 g/dL (ref 1.5–4.5)
Glucose: 101 mg/dL — ABNORMAL HIGH (ref 65–99)
Potassium: 4.6 mmol/L (ref 3.5–5.2)
Sodium: 138 mmol/L (ref 134–144)
Total Protein: 6.8 g/dL (ref 6.0–8.5)

## 2019-01-07 LAB — CBC WITH DIFFERENTIAL/PLATELET
Basophils Absolute: 0 10*3/uL (ref 0.0–0.2)
Basos: 1 %
EOS (ABSOLUTE): 0.3 10*3/uL (ref 0.0–0.4)
Eos: 6 %
Hematocrit: 38.9 % (ref 37.5–51.0)
Hemoglobin: 12.5 g/dL — ABNORMAL LOW (ref 13.0–17.7)
Immature Grans (Abs): 0 10*3/uL (ref 0.0–0.1)
Immature Granulocytes: 0 %
Lymphocytes Absolute: 1.6 10*3/uL (ref 0.7–3.1)
Lymphs: 29 %
MCH: 29.3 pg (ref 26.6–33.0)
MCHC: 32.1 g/dL (ref 31.5–35.7)
MCV: 91 fL (ref 79–97)
Monocytes Absolute: 0.8 10*3/uL (ref 0.1–0.9)
Monocytes: 15 %
Neutrophils Absolute: 2.7 10*3/uL (ref 1.4–7.0)
Neutrophils: 49 %
Platelets: 178 10*3/uL (ref 150–450)
RBC: 4.26 x10E6/uL (ref 4.14–5.80)
RDW: 14.3 % (ref 11.6–15.4)
WBC: 5.6 10*3/uL (ref 3.4–10.8)

## 2019-01-07 LAB — LIPID PANEL
Chol/HDL Ratio: 3 ratio (ref 0.0–5.0)
Cholesterol, Total: 149 mg/dL (ref 100–199)
HDL: 50 mg/dL (ref >39)
LDL Calculated: 77 mg/dL (ref 0–99)
Triglycerides: 111 mg/dL (ref 0–149)
VLDL Cholesterol Cal: 22 mg/dL (ref 5–40)

## 2019-01-07 NOTE — Progress Notes (Signed)
Hello Taylor Cross,  Your lab result is normal.Some minor variations that are not significant are commonly marked abnormal, but do not represent any medical problem for you.  Best regards, Claretta Fraise, M.D.

## 2019-01-14 ENCOUNTER — Telehealth: Payer: Self-pay | Admitting: Cardiology

## 2019-01-14 NOTE — Telephone Encounter (Signed)

## 2019-01-15 ENCOUNTER — Ambulatory Visit (INDEPENDENT_AMBULATORY_CARE_PROVIDER_SITE_OTHER): Payer: Medicare Other

## 2019-01-15 ENCOUNTER — Other Ambulatory Visit: Payer: Self-pay

## 2019-01-15 DIAGNOSIS — I5022 Chronic systolic (congestive) heart failure: Secondary | ICD-10-CM | POA: Diagnosis not present

## 2019-01-16 ENCOUNTER — Telehealth: Payer: Self-pay | Admitting: *Deleted

## 2019-01-16 NOTE — Telephone Encounter (Signed)
Pt voiced understanding and appreciative - routed to pcp  °

## 2019-01-16 NOTE — Telephone Encounter (Signed)
-----   Message from Arnoldo Lenis, MD sent at 01/16/2019 10:59 AM EDT ----- Echo shows heart function has improved and now is back to normal range  Zandra Abts MD

## 2019-02-03 ENCOUNTER — Other Ambulatory Visit: Payer: Self-pay

## 2019-02-03 ENCOUNTER — Ambulatory Visit (INDEPENDENT_AMBULATORY_CARE_PROVIDER_SITE_OTHER): Payer: Medicare Other | Admitting: Family Medicine

## 2019-02-03 ENCOUNTER — Encounter: Payer: Self-pay | Admitting: Family Medicine

## 2019-02-03 DIAGNOSIS — I10 Essential (primary) hypertension: Secondary | ICD-10-CM

## 2019-02-03 DIAGNOSIS — N289 Disorder of kidney and ureter, unspecified: Secondary | ICD-10-CM

## 2019-02-03 NOTE — Progress Notes (Signed)
Subjective:    Patient ID: Taylor Cross, male    DOB: [birth year], 73 y.o.   MRN: [id number]   HPI: Taylor Cross is a 73 y.o. male presenting for blood pressure. Staying under 732 systolic. Cardiology vist this month. Heart doing well. Has a normal beat. Pt. Has started walking more and more without any dyspnea. More energy.   130/60, had one go to 94/50 felt weak. Another 98/60. Usually 120s /71, etc -  110-130/ 50-70 on review of logs.  Reviewed blood work from 6/9 with pt. Noted eGFR of 48, slight decrease, but stable   Depression screen [facility] 2/9 06/11/2018 12/25/2017 12/11/2017 11/08/2017 10/17/2017  Decreased Interest 0 0 0 0 0  Down, Depressed, Hopeless 0 0 0 0 1  PHQ - 2 Score 0 0 0 0 1  Altered sleeping - - - - -  Tired, decreased energy - - - - -  Change in appetite - - - - -  Feeling bad or failure about yourself  - - - - -  Trouble concentrating - - - - -  Moving slowly or fidgety/restless - - - - -  Suicidal thoughts - - - - -  PHQ-9 Score - - - - -     Relevant past medical, surgical, family and social history reviewed and updated as indicated.  Interim medical history since our last visit reviewed. Allergies and medications reviewed and updated.  ROS:  Review of Systems  Constitutional: Negative.   HENT: Negative.   Eyes: Negative for visual disturbance.  Respiratory: Negative for cough and shortness of breath.   Cardiovascular: Negative for chest pain and leg swelling.  Gastrointestinal: Negative for abdominal pain, diarrhea, nausea and vomiting.  Genitourinary: Negative for difficulty urinating.  Musculoskeletal: Negative for arthralgias and myalgias.  Skin: Negative for rash.  Neurological: Negative for headaches.  Psychiatric/Behavioral: Negative for sleep disturbance.     Social History   Tobacco Use  Smoking Status Never Smoker  Smokeless Tobacco Never Used       Objective:     Wt Readings from Last 3 Encounters:  01/06/19 191 lb (86.6  kg)  09/23/18 190 lb (86.2 kg)  08/07/18 193 lb (87.5 kg)     Exam deferred. Pt. Harboring due to COVID 19. Phone visit performed.   Assessment & Plan:   1. Essential hypertension   2. Kidney insufficiency     No orders of the defined types were placed in this encounter.   No orders of the defined types were placed in this encounter.     Diagnoses and all orders for this visit:  Essential hypertension  Kidney insufficiency    Virtual Visit via telephone Note  I discussed the limitations, risks, security and privacy concerns of performing an evaluation and management service by telephone and the availability of in person appointments. The patient was identified with two identifiers. Pt.expressed understanding and agreed to proceed. Pt. Is at home. Dr. Livia Snellen is in his office.  Follow Up Instructions:   I discussed the assessment and treatment plan with the patient. The patient was provided an opportunity to ask questions and all were answered. The patient agreed with the plan and demonstrated an understanding of the instructions.   The patient was advised to call back or seek an in-person evaluation if the symptoms worsen or if the condition fails to improve as anticipated.   Total minutes including chart review and phone contact time: 20   Follow up plan:  No follow-ups on file.  Claretta Fraise, MD [place]

## 2019-02-09 ENCOUNTER — Other Ambulatory Visit: Payer: Self-pay | Admitting: Family Medicine

## 2019-03-31 ENCOUNTER — Ambulatory Visit (INDEPENDENT_AMBULATORY_CARE_PROVIDER_SITE_OTHER): Payer: Medicare Other | Admitting: Cardiology

## 2019-03-31 ENCOUNTER — Encounter: Payer: Self-pay | Admitting: Cardiology

## 2019-03-31 ENCOUNTER — Other Ambulatory Visit: Payer: Self-pay

## 2019-03-31 VITALS — BP 124/68 | HR 66 | Temp 98.2°F | Ht 68.0 in | Wt 191.4 lb

## 2019-03-31 DIAGNOSIS — E782 Mixed hyperlipidemia: Secondary | ICD-10-CM | POA: Diagnosis not present

## 2019-03-31 DIAGNOSIS — I10 Essential (primary) hypertension: Secondary | ICD-10-CM

## 2019-03-31 DIAGNOSIS — I493 Ventricular premature depolarization: Secondary | ICD-10-CM | POA: Diagnosis not present

## 2019-03-31 DIAGNOSIS — I5022 Chronic systolic (congestive) heart failure: Secondary | ICD-10-CM

## 2019-03-31 DIAGNOSIS — N183 Chronic kidney disease, stage 3 unspecified: Secondary | ICD-10-CM

## 2019-03-31 NOTE — Progress Notes (Signed)
Clinical Summary Taylor Cross is a 73 y.o.male seen today for follow up of the following medical problems.   1. Chronic Systolic/Diastolic heart failure  - 10/2013 echo which showed LVEF 40-45%, mid-inferolateral wall hypokinesis, and grade II diastolic dysfunction.  - repeat echo 08/2014 LVEF 35-40%, basal to mid inferolateral and inferior walls, grade I diastolic dysfunction - completed lexiscan which showed moderate sized moderate to severe intensity inferolateral wall defect and small apical to mid inferior wall defect. Both areas were fixed and consistent with scar.   - lisinopril stoppedby other provider due to decreasingrenal function.   12/2018 echo: LVEF 50-55% - walks regularly without troubles, no recent edema - compliant with meds   2. HL -he is compliant with statin  12/2018  TC 149 TG 111 HDL 50 LDL 77  3. HTN - compliant with meds - mild orthostatic symptoms at times  4. CKD III - Cr has improved off ACE-I.  Past Medical History:  Diagnosis Date  . Combined systolic and diastolic heart failure ([place])   . Hyperlipidemia      Allergies  Allergen Reactions  . Neosporin [Neomycin-Bacitracin Zn-Polymyx] Rash  . Voltaren [Diclofenac Sodium] Other (See Comments)    GI upset     Current Outpatient Medications  Medication Sig Dispense Refill  . allopurinol (ZYLOPRIM) 100 MG tablet Take 1 tablet (100 mg total) by mouth daily. 90 tablet 1  . aspirin 81 MG chewable tablet Chew 81 mg by mouth daily.    . carvedilol (COREG) 25 MG tablet Take 0.5 tablets (12.5 mg total) by mouth 2 (two) times daily with a meal. 90 tablet 1  . Clobetasol Prop Emollient Base (CLOBETASOL PROPIONATE E) 0.05 % emollient cream Apply 1 Tube topically 2 (two) times daily. 60 g 3  . finasteride (PROSCAR) 5 MG tablet Take 1 tablet (5 mg total) by mouth daily. 90 tablet 1  . fluticasone (FLONASE) 50 MCG/ACT nasal spray 2 spray daily each day each nostril 16 g 11  . gabapentin  (NEURONTIN) 300 MG capsule Take 1 capsule (300 mg total) by mouth 2 (two) times daily. 180 capsule 1  . hydroquinone 4 % cream APPLY TO AFFECTED AREAS TWICE A DAY 28.35 g 0  . loratadine (CLARITIN) 10 MG tablet TAKE ONE TABLET AT BEDTIME 90 tablet 1  . pantoprazole (PROTONIX) 40 MG tablet Take 1 tablet (40 mg total) by mouth daily. For stomach 90 tablet 1  . pravastatin (PRAVACHOL) 40 MG tablet Take 1 tablet (40 mg total) by mouth daily. 90 tablet 1  . sertraline (ZOLOFT) 50 MG tablet Take 1 tablet (50 mg total) by mouth daily. 90 tablet 1  . tamsulosin (FLOMAX) 0.4 MG CAPS capsule Take 2 capsules (0.8 mg total) by mouth daily. 60 capsule 11  . traMADol (ULTRAM) 50 MG tablet Take 1 tablet (50 mg total) by mouth 2 (two) times daily. For back pain 60 tablet 5   No current facility-administered medications for this visit.      Past Surgical History:  Procedure Laterality Date  . HERNIA REPAIR    . TONSILLECTOMY       Allergies  Allergen Reactions  . Neosporin [Neomycin-Bacitracin Zn-Polymyx] Rash  . Voltaren [Diclofenac Sodium] Other (See Comments)    GI upset      Family History  Problem Relation Age of Onset  . Heart disease Mother   . Diabetes Mother   . Heart failure Mother   . COPD Father   . Heart attack  Brother 1     Social History Taylor Cross reports that he has never smoked. He has never used smokeless tobacco. Taylor Cross reports no history of alcohol use.   Review of Systems CONSTITUTIONAL: No weight loss, fever, chills, weakness or fatigue.  HEENT: Eyes: No visual loss, blurred vision, double vision or yellow sclerae.No hearing loss, sneezing, congestion, runny nose or sore throat.  SKIN: No rash or itching.  CARDIOVASCULAR: per hpi RESPIRATORY: No shortness of breath, cough or sputum.  GASTROINTESTINAL: No anorexia, nausea, vomiting or diarrhea. No abdominal pain or blood.  GENITOURINARY: No burning on urination, no polyuria NEUROLOGICAL: No headache,  dizziness, syncope, paralysis, ataxia, numbness or tingling in the extremities. No change in bowel or bladder control.  MUSCULOSKELETAL: No muscle, back pain, joint pain or stiffness.  LYMPHATICS: No enlarged nodes. No history of splenectomy.  PSYCHIATRIC: No history of depression or anxiety.  ENDOCRINOLOGIC: No reports of sweating, cold or heat intolerance. No polyuria or polydipsia.  Marland Kitchen   Physical Examination Today's Vitals   03/31/19 1427  BP: 124/68  Pulse: 66  Temp: 98.2 F (36.8 C)  SpO2: 96%  Weight: 191 lb 6.4 oz (86.8 kg)  Height: _0  (1.727 m)   Body mass index is 29.1 kg/m.  Gen: resting comfortably, no acute distress HEENT: no scleral icterus, pupils equal round and reactive, no palptable cervical adenopathy,  CV: RRR, no mr/g, no jvd Resp: Clear to auscultation bilaterally GI: abdomen is soft, non-tender, non-distended, normal bowel sounds, no hepatosplenomegaly MSK: extremities are warm, no edema.  Skin: warm, no rash Neuro:  no focal deficits Psych: appropriate affect   Diagnostic Studies  10/2013 Echo Left ventricle: The cavity size was mildly dilated. Wall thickness was normal. Systolic function was mildly to moderately reduced. The estimated ejection fraction is 40%. Features are consistent with a pseudonormal left ventricular filling pattern, with concomitant abnormal relaxation and increased filling pressure (grade 2 diastolic dysfunction). Doppler parameters are consistent with high ventricular filling pressure. - Regional wall motion abnormality: Mild to moderate hypokinesis of the mid inferolateral myocardium. The remaining walls are mildly hypokinetic. - Aortic valve: Trileaflet; mildly thickened, mildly calcified leaflets. There was no stenosis. - Mitral valve: Mildly thickened leaflets . Mild to moderate regurgitation. - Left atrium: The atrium was moderately dilated. - Tricuspid valve: Mild regurgitation. Inadequate TR jet to  accurately assess pulmonary pressures. - Pericardium, extracardiac: There was a left pleural effusion.  11/11/13 Lexiscan MPI Gated imaging reveals an EDV of 223, ESV of 153, TID ratio 0.99, and LVEF of 31% with global hypokinesis, most prominent in the apex and inferolateral walls.  IMPRESSION: Intermediate risk Lexiscan Cardiolite. No diagnostic ST segment changes were noted to indicate ischemia, frequent PVCs were seen without sustained arrhythmia. Perfusion imaging is most consistent with scar affecting the inferior wall and inferolateral wall as outlined, no large ischemic zones however. LVEF is calculated at 31% with global hypokinesis that is most prominent at the apex and inferior wall. EDV indicates moderate to severe chamber dilatation. Findings are consistent with probable ischemic cardiomyopathy.   08/2014 echo Study Conclusions  - Left ventricle: The cavity size was mildly dilated. Wall thickness was increased in a pattern of moderate LVH. Systolic function was moderately reduced. The estimated ejection fraction was in the range of 35% to 40%. There is hypokinesis to akinesis of the basal-mid inferolateral and inferior myocardium. Doppler parameters are consistent with abnormal left ventricular relaxation (grade 1 diastolic dysfunction). - Aortic valve: Mildly to moderately calcified annulus.  Trileaflet. - Mitral valve: Mildly calcified annulus. There was mild regurgitation. - Left atrium: The atrium was mildly to moderately dilated. - Right atrium: Central venous pressure (est): 3 mm Hg. - Atrial septum: Possible PFO or secundum ASD with apparent left to right flow noted by color Doppler. Cannot exclude vigorous venous return adjacent to septum. Agitated saline study could be considered for further evaluation. - Tricuspid valve: There was trivial regurgitation. - Pulmonary arteries: PA peak pressure: 17 mm Hg (S). - Pericardium,  extracardiac: There was no pericardial effusion.  Recommendations: Moderate LVH with mild LV chamber dilatation and LVEF approximately 35-40%. Hypokinesis to akinesis of the mid to basal inferolateral/inferior walls noted suggestive of ischemic cardiomyopathy. Grade 1 diastolic dysfunction. Compared to prior study in April 2015, wall motion has improved somewhat and ejection fraction looks to be higher, although graded in similar range last time. Mild to moderate left atrial enlargement. Mild MAC with mild mitral regurgitation. Possible PFO or secundum ASD with apparent left to right flow noted by color Doppler. Cannot exclude vigorous venous return adjacent to septum. Agitated saline study could be considered for further evaluation. Trivial tricuspid regurgitation with PASP normal range.       12/2018 echo 1. The left ventricle has low normal systolic function, with an ejection fraction of 50-55%. The cavity size was normal. There is mildly increased left ventricular wall thickness. Left ventricular diastolic Doppler parameters are consistent with  impaired relaxation. Elevated mean left atrial pressure.  2. The right ventricle has normal systolic function. The cavity was normal. There is no increase in right ventricular wall thickness.  3. Left atrial size was moderately dilated.  4. No evidence of mitral valve stenosis.  5. The aortic valve is tricuspid. No stenosis of the aortic valve.  6. The aortic root is normal in size and structure.  7. Pulmonary hypertension is indeterminant, inadequate TR jet.    Assessment and Plan   1. Chronic Systolic/diastolic heart failure -Medical therapy has been limited due to orthostatic dizziness, renal dysfunction - LVEF has now normalized, no recent symptoms - continue current meds  2. HTN -at goal,continue current meds  3. Hyperlipidemia Lipids at goal, cotninue statin  4. CKD 3 - continue to monitor, has been stable  5. PVCs  - EKG today shows bigeminy, noted PVCs previously. Asymptomatic, continue to monitor  F/u 1year   Arnoldo Lenis, M.D.,

## 2019-03-31 NOTE — Patient Instructions (Signed)

## 2019-04-01 NOTE — Addendum Note (Signed)
Addended by: HILL, ANGELA G on: 04/01/2019 10:49 AM   Modules accepted: Orders  

## 2019-06-18 ENCOUNTER — Other Ambulatory Visit: Payer: Self-pay | Admitting: Family Medicine

## 2019-06-22 ENCOUNTER — Other Ambulatory Visit: Payer: Self-pay | Admitting: Family Medicine

## 2019-06-23 ENCOUNTER — Telehealth: Payer: Self-pay | Admitting: Family Medicine

## 2019-06-23 ENCOUNTER — Other Ambulatory Visit: Payer: Self-pay | Admitting: Family Medicine

## 2019-06-23 NOTE — Telephone Encounter (Signed)
What is the name of the medication? PAIN MEDICATION  Have you contacted your pharmacy to request a refill? YES  Which pharmacy would you like this sent to? [place] wants to know why it was denied it makes him angry he has been on this for years for back pain   Patient notified that their request is being sent to the clinical staff for review and that they should receive a call once it is complete. If they do not receive a call within 24 hours they can check with their pharmacy or our office.

## 2019-06-24 ENCOUNTER — Ambulatory Visit (INDEPENDENT_AMBULATORY_CARE_PROVIDER_SITE_OTHER): Payer: Medicare Other | Admitting: Family Medicine

## 2019-06-24 ENCOUNTER — Other Ambulatory Visit: Payer: Self-pay

## 2019-06-24 ENCOUNTER — Encounter: Payer: Self-pay | Admitting: Family Medicine

## 2019-06-24 DIAGNOSIS — M47816 Spondylosis without myelopathy or radiculopathy, lumbar region: Secondary | ICD-10-CM | POA: Diagnosis not present

## 2019-06-24 MED ORDER — TRAMADOL HCL 50 MG PO TABS: 50.0000 mg | ORAL_TABLET | Freq: Two times a day (BID) | ORAL | 5 refills | Status: DC

## 2019-06-24 NOTE — Progress Notes (Signed)
Subjective:    Patient ID: Taylor Cross, male    DOB: 09/05/1945, 73 y.o.   MRN: [id number]   HPI: Taylor Cross is a 74 y.o. male presenting for flare of chronic back pain.l It has been chronic recurring with a flare starting a couple of weeks ago. He rested, and he took tramaadol with good improvement. Now nearly back to normal but he ran out of tramadol. He has renal insufficiency so he cannot take NSAIDS. Creatinine climbed last time he tried. Pain has bbeen moderate, 6/10 recently, now declining   Depression screen Taylor Cross 2/9 06/11/2018 12/25/2017 12/11/2017 11/08/2017 10/17/2017  Decreased Interest 0 0 0 0 0  Down, Depressed, Hopeless 0 0 0 0 1  PHQ - 2 Score 0 0 0 0 1  Altered sleeping - - - - -  Tired, decreased energy - - - - -  Change in appetite - - - - -  Feeling bad or failure about yourself  - - - - -  Trouble concentrating - - - - -  Moving slowly or fidgety/restless - - - - -  Suicidal thoughts - - - - -  PHQ-9 Score - - - - -     Relevant past medical, surgical, family and social history reviewed and updated as indicated.  Interim medical history since our last visit reviewed. Allergies and medications reviewed and updated.  ROS:  Review of Systems  Constitutional: Negative for chills, diaphoresis and fever.  HENT: Negative for sore throat.   Respiratory: Negative for shortness of breath.   Cardiovascular: Negative for chest pain.  Gastrointestinal: Negative for abdominal pain.  Musculoskeletal: Positive for arthralgias, back pain and myalgias. Negative for gait problem and neck pain.  Skin: Negative for rash.  Neurological: Positive for weakness. Negative for numbness.     Social History   Tobacco Use  Smoking Status Never Smoker  Smokeless Tobacco Never Used       Objective:     Wt Readings from Last 3 Encounters:  03/31/19 191 lb 6.4 oz (86.8 kg)  01/06/19 191 lb (86.6 kg)  09/23/18 190 lb (86.2 kg)     Exam deferred. Pt. [place] due to  COVID 19. Phone visit performed.   Assessment & Plan:   1. Spondylosis of lumbar region without myelopathy or radiculopathy     Meds ordered this encounter  Medications  . traMADol (ULTRAM) 50 MG tablet    Sig: Take 1 tablet (50 mg total) by mouth 2 (two) times daily. For back pain    Dispense:  60 tablet    Refill:  5    No orders of the defined types were placed in this encounter.     Diagnoses and all orders for this visit:  Spondylosis of lumbar region without myelopathy or radiculopathy  Other orders -     traMADol (ULTRAM) 50 MG tablet; Take 1 tablet (50 mg total) by mouth 2 (two) times daily. For back pain    Virtual Visit via telephone Note  I discussed the limitations, risks, security and privacy concerns of performing an evaluation and management service by telephone and the availability of in person appointments. The patient was identified with two identifiers. Pt.expressed understanding and agreed to proceed. Pt. Is at home. Dr. Livia Snellen is in his office.  Follow Up Instructions:   I discussed the assessment and treatment plan with the patient. The patient was provided an opportunity to ask questions and all were answered. The patient agreed  with the plan and demonstrated an understanding of the instructions.   The patient was advised to call back or seek an in-person evaluation if the symptoms worsen or if the condition fails to improve as anticipated.   Total minutes including chart review and phone contact time: 18   Follow up plan: Return if symptoms worsen or fail to improve.  Taylor Fraise, MD [place]

## 2019-06-24 NOTE — Telephone Encounter (Signed)
Pt needs tramadol= tele set up with [facility]

## 2019-06-24 NOTE — Telephone Encounter (Signed)
Appt today (06/24/19) with [name]

## 2019-06-30 ENCOUNTER — Other Ambulatory Visit: Payer: Self-pay | Admitting: Family Medicine

## 2019-07-16 ENCOUNTER — Other Ambulatory Visit: Payer: Self-pay | Admitting: Family Medicine

## 2019-07-16 ENCOUNTER — Telehealth: Payer: Self-pay | Admitting: Family Medicine

## 2019-07-16 MED ORDER — MUPIROCIN 2 % EX OINT: TOPICAL_OINTMENT | CUTANEOUS | 2 refills | Status: DC

## 2019-07-16 NOTE — Telephone Encounter (Signed)
What is the name of the medication? mupirocin ointment (BACTROBAN) 2 % [Pharmacy Med Name: MUPIROCIN 2% OINTMENT]  Have you contacted your pharmacy to request a refill? Yes  Which pharmacy would you like this sent to? [facility], he has some spider bites and he can not use neosporin because he is allergic to it    Patient notified that their request is being sent to the clinical staff for review and that they should receive a call once it is complete. If they do not receive a call within 24 hours they can check with their pharmacy or our office.

## 2019-07-16 NOTE — Telephone Encounter (Signed)
Patient aware and verbalized understanding. °

## 2019-07-16 NOTE — Telephone Encounter (Signed)
I sent in the requested prescription 

## 2019-08-19 ENCOUNTER — Other Ambulatory Visit: Payer: Self-pay | Admitting: Family Medicine

## 2019-09-03 ENCOUNTER — Other Ambulatory Visit: Payer: Self-pay | Admitting: Family Medicine

## 2019-09-09 ENCOUNTER — Other Ambulatory Visit: Payer: Self-pay | Admitting: Family Medicine

## 2019-09-16 ENCOUNTER — Other Ambulatory Visit: Payer: Self-pay | Admitting: Family Medicine

## 2019-09-28 ENCOUNTER — Telehealth (INDEPENDENT_AMBULATORY_CARE_PROVIDER_SITE_OTHER): Payer: Medicare Other | Admitting: Family Medicine

## 2019-09-28 ENCOUNTER — Other Ambulatory Visit: Payer: Self-pay | Admitting: Family Medicine

## 2019-09-28 DIAGNOSIS — L309 Dermatitis, unspecified: Secondary | ICD-10-CM

## 2019-09-28 MED ORDER — SERTRALINE HCL 50 MG PO TABS: 50.0000 mg | ORAL_TABLET | Freq: Every day | ORAL | 0 refills | Status: DC

## 2019-09-28 MED ORDER — PRAVASTATIN SODIUM 40 MG PO TABS: 40.0000 mg | ORAL_TABLET | Freq: Every day | ORAL | 0 refills | Status: DC

## 2019-09-28 MED ORDER — PREDNISONE 10 MG (21) PO TBPK: ORAL_TABLET | ORAL | 0 refills | Status: DC

## 2019-09-28 MED ORDER — PANTOPRAZOLE SODIUM 40 MG PO TBEC: DELAYED_RELEASE_TABLET | ORAL | 0 refills | Status: DC

## 2019-09-28 NOTE — Progress Notes (Signed)
MyChart Video visit  Subjective: Taylor Cross PCP: [name], MD QMG:[id number] D Wescott is a 74 y.o. male calls for video consult today. Patient provides verbal consent for consult held via video.  Due to COVID-19 pandemic this visit was conducted virtually. This visit type was conducted due to national recommendations for restrictions regarding the COVID-19 Pandemic (e.g. social distancing, sheltering in place) in an effort to limit this patient's exposure and mitigate transmission in our community. All issues noted in this document were discussed and addressed.  A physical exam was not performed with this format.   Location of patient: home Location of provider: Working remotely from home Others present for call: none  1.  Rash Patient reports a 1 month history of rash that began on his face and has subsequently spread to his neck and chest.  He is used various topicals including Bactroban, old clobetasol, Sarna in efforts to improve symptoms.  The started does seem to help with the others did not seem very effective.  He takes Claritin nightly.  He describes that the rash is very itchy.  Denies any known exposures but notes that he does have intolerance to aloe vera and wool.   ROS: Per HPI  Allergies  Allergen Reactions  . Neosporin [Neomycin-Bacitracin Zn-Polymyx] Rash  . Voltaren [Diclofenac Sodium] Other (See Comments)    GI upset   Past Medical History:  Diagnosis Date  . Combined systolic and diastolic heart failure ([facility])   . Hyperlipidemia     Current Outpatient Medications:  .  allopurinol (ZYLOPRIM) 100 MG tablet, TAKE 1 TABLET DAILY, Disp: 30 tablet, Rfl: 0 .  aspirin 81 MG chewable tablet, Chew 81 mg by mouth daily., Disp: , Rfl:  .  carvedilol (COREG) 25 MG tablet, Take 0.5 tablets (12.5 mg total) by mouth 2 (two) times daily. (Needs to be seen before next refill), Disp: 30 tablet, Rfl: 0 .  Clobetasol Prop Emollient Base (CLOBETASOL PROPIONATE E) 0.05 % emollient cream,  Apply 1 Tube topically 2 (two) times daily., Disp: 60 g, Rfl: 3 .  finasteride (PROSCAR) 5 MG tablet, Take 1 tablet (5 mg total) by mouth daily., Disp: 90 tablet, Rfl: 1 .  fluticasone (FLONASE) 50 MCG/ACT nasal spray, 2 spray daily each day each nostril, Disp: 16 g, Rfl: 11 .  gabapentin (NEURONTIN) 300 MG capsule, TAKE  (1)  CAPSULE  TWICE DAILY., Disp: 180 capsule, Rfl: 0 .  hydroquinone 4 % cream, APPLY TO AFFECTED AREAS TWICE A DAY, Disp: 28.35 g, Rfl: 0 .  loratadine (CLARITIN) 10 MG tablet, TAKE ONE TABLET AT BEDTIME, Disp: 90 tablet, Rfl: 1 .  mupirocin ointment (BACTROBAN) 2 %, Apply 2 (two) times daily. To affected areas, Disp: 22 g, Rfl: 2 .  pantoprazole (PROTONIX) 40 MG tablet, TAKE 1 TABLET ONCE DAILY FOR REFLUX, Disp: 90 tablet, Rfl: 0 .  pravastatin (PRAVACHOL) 40 MG tablet, TAKE 1 TABLET DAILY, Disp: 90 tablet, Rfl: 0 .  sertraline (ZOLOFT) 50 MG tablet, TAKE 1 TABLET DAILY, Disp: 90 tablet, Rfl: 0 .  tamsulosin (FLOMAX) 0.4 MG CAPS capsule, Take 2 capsules (0.8 mg total) by mouth daily., Disp: 60 capsule, Rfl: 11 .  traMADol (ULTRAM) 50 MG tablet, Take 1 tablet (50 mg total) by mouth 2 (two) times daily. For back pain, Disp: 60 tablet, Rfl: 5  Skin: Erythematous, maculopapular rash noted along the medial aspect of the chest.  No appreciable fascicular formation or skin breakdown.  Assessment/ Plan: 74 y.o. male   1. Dermatitis ?  Contact versus allergic dermatitis.  Recommended continuing Claritin.  I will place him on an oral steroid given involvement of neck and face.  Okay to continue Sarna.  We discussed if this does not resolve the rash I would recommend that he be evaluated.  He is already had 1 of 2 doses of Covid vaccine so I highly doubt that this is related to Covid infection.  He understands return precautions.  I have also gone ahead and sent in 3 months of the medications he was needing refills on.  He will contact the office to schedule appointment with labs with  his PCP. - predniSONE (STERAPRED UNI-PAK 21 TAB) 10 MG (21) TBPK tablet; As directed x 6 days  Dispense: 21 tablet; Refill: 0 Meds ordered this encounter  Medications  . predniSONE (STERAPRED UNI-PAK 21 TAB) 10 MG (21) TBPK tablet    Sig: As directed x 6 days    Dispense:  21 tablet    Refill:  0  . sertraline (ZOLOFT) 50 MG tablet    Sig: Take 1 tablet (50 mg total) by mouth daily. Needs office visit for refills    Dispense:  90 tablet    Refill:  0  . pantoprazole (PROTONIX) 40 MG tablet    Sig: TAKE 1 TABLET ONCE DAILY FOR REFLUX (needs OV for refills)    Dispense:  90 tablet    Refill:  0  . pravastatin (PRAVACHOL) 40 MG tablet    Sig: Take 1 tablet (40 mg total) by mouth daily. (needs OV for refills)    Dispense:  90 tablet    Refill:  0     Start time: 3:00pm End time: 3:13p  Total time spent on patient care (including telephone call/ virtual visit): 22 minutes  [place], [place] [phone]

## 2019-10-14 ENCOUNTER — Ambulatory Visit: Payer: Medicare Other | Admitting: Family Medicine

## 2019-10-20 ENCOUNTER — Other Ambulatory Visit: Payer: Self-pay | Admitting: *Deleted

## 2019-10-20 ENCOUNTER — Other Ambulatory Visit: Payer: Self-pay

## 2019-10-20 ENCOUNTER — Encounter: Payer: Self-pay | Admitting: Family Medicine

## 2019-10-20 ENCOUNTER — Ambulatory Visit (INDEPENDENT_AMBULATORY_CARE_PROVIDER_SITE_OTHER): Payer: Medicare Other | Admitting: Family Medicine

## 2019-10-20 VITALS — BP 130/82 | HR 72 | Temp 97.6°F | Ht 68.0 in | Wt 191.2 lb

## 2019-10-20 DIAGNOSIS — Z125 Encounter for screening for malignant neoplasm of prostate: Secondary | ICD-10-CM

## 2019-10-20 DIAGNOSIS — N183 Chronic kidney disease, stage 3 unspecified: Secondary | ICD-10-CM

## 2019-10-20 DIAGNOSIS — M47816 Spondylosis without myelopathy or radiculopathy, lumbar region: Secondary | ICD-10-CM | POA: Diagnosis not present

## 2019-10-20 DIAGNOSIS — Z0289 Encounter for other administrative examinations: Secondary | ICD-10-CM

## 2019-10-20 DIAGNOSIS — R351 Nocturia: Secondary | ICD-10-CM

## 2019-10-20 DIAGNOSIS — N401 Enlarged prostate with lower urinary tract symptoms: Secondary | ICD-10-CM

## 2019-10-20 DIAGNOSIS — K2100 Gastro-esophageal reflux disease with esophagitis, without bleeding: Secondary | ICD-10-CM

## 2019-10-20 DIAGNOSIS — J438 Other emphysema: Secondary | ICD-10-CM | POA: Diagnosis not present

## 2019-10-20 DIAGNOSIS — I10 Essential (primary) hypertension: Secondary | ICD-10-CM | POA: Diagnosis not present

## 2019-10-20 DIAGNOSIS — E782 Mixed hyperlipidemia: Secondary | ICD-10-CM

## 2019-10-20 DIAGNOSIS — M1A30X0 Chronic gout due to renal impairment, unspecified site, without tophus (tophi): Secondary | ICD-10-CM

## 2019-10-20 LAB — URINALYSIS
Bilirubin, UA: NEGATIVE
Glucose, UA: NEGATIVE
Ketones, UA: NEGATIVE
Leukocytes,UA: NEGATIVE
Nitrite, UA: NEGATIVE
Protein,UA: NEGATIVE
RBC, UA: NEGATIVE
Specific Gravity, UA: 1.02 (ref 1.005–1.030)
Urobilinogen, Ur: 0.2 mg/dL (ref 0.2–1.0)
pH, UA: 5 (ref 5.0–7.5)

## 2019-10-20 MED ORDER — CARVEDILOL 25 MG PO TABS: 12.5000 mg | ORAL_TABLET | Freq: Two times a day (BID) | ORAL | 0 refills | Status: DC

## 2019-10-20 MED ORDER — ALLOPURINOL 100 MG PO TABS: 100.0000 mg | ORAL_TABLET | Freq: Every day | ORAL | 0 refills | Status: DC

## 2019-10-20 MED ORDER — TRAMADOL HCL 50 MG PO TABS: 50.0000 mg | ORAL_TABLET | Freq: Two times a day (BID) | ORAL | 3 refills | Status: DC

## 2019-10-20 NOTE — Progress Notes (Signed)
Subjective:  Patient ID: Taylor Cross, male    DOB: 01-20-1946  Age: 74 y.o. MRN: [id number]  CC: Follow-up (6 month)   HPI ATWOOD ADCOCK presents for  follow-up of hypertension. Patient has no history of headache chest pain or shortness of breath or recent cough. Patient also denies symptoms of TIA such as focal numbness or weakness. Patient denies side effects from medication. States taking it regularly.  Patient is taking tramadol as well as gabapentin for his lumbar spondylosis pain.  He rates that pain is moderate.  He is due for pain contract renewal.  PDMP review shows that he is consistent in filling his tramadol.  No discrepancies are noted.  He does have 2 refills left.  He is not due to come back after today for 6 months.  So he will be given a 29-monthprescription with a 279-monthelay.  He is also seen for BPH.  As long as he takes the Flomax he does not have to urinate as frequently at night.  He denies any side effects.  No incontinence.  He also takes finasteride for prostate related.  He is due for PSA for cancer screening.   Patient in for follow-up of GERD. Currently asymptomatic taking  PPI daily. There is no chest pain or heartburn. No hematemesis and no melena. No dysphagia or choking. Onset is remote. Progression is stable. Complicating factors, none.   Patient is also treated with allopurinol for his hyperuricemia.  No recent gout attack.  He does have some renal insufficiency as well.  Need to follow this closely and modify his allopurinol dose as needed.  Patient in for follow-up of elevated cholesterol. Doing well without complaints on current medication. Denies side effects of statin including myalgia and arthralgia and nausea. Also in today for liver function testing. Currently no chest pain, shortness of breath or other cardiovascular related symptoms noted. Patient denies shortness of breath at this time.  He has history of diagnosis with emphysema however his  activities are light at this time and no evidence for decompensation of COPD has been noted.   History FrJongas a past medical history of Combined systolic and diastolic heart failure ([place] Hyperlipidemia.   He has a past surgical history that includes Tonsillectomy and Hernia repair.   His family history includes COPD in his father; Diabetes in his mother; Heart attack (age of onset: 8193in his brother; Heart disease in his mother; Heart failure in his mother.He reports that he has never smoked. He has never used smokeless tobacco. He reports that he does not drink alcohol or use drugs.  Current Outpatient Medications on File Prior to Visit  Medication Sig Dispense Refill  . aspirin 81 MG chewable tablet Chew 81 mg by mouth daily.    . Clobetasol Prop Emollient Base (CLOBETASOL PROPIONATE E) 0.05 % emollient cream Apply 1 Tube topically 2 (two) times daily. 60 g 3  . finasteride (PROSCAR) 5 MG tablet Take 1 tablet (5 mg total) by mouth daily. 90 tablet 1  . fluticasone (FLONASE) 50 MCG/ACT nasal spray 2 spray daily each day each nostril 16 g 11  . gabapentin (NEURONTIN) 300 MG capsule TAKE  (1)  CAPSULE  TWICE DAILY. 180 capsule 0  . hydroquinone 4 % cream APPLY TO AFFECTED AREAS TWICE A DAY 28.35 g 0  . loratadine (CLARITIN) 10 MG tablet TAKE ONE TABLET AT BEDTIME 90 tablet 1  . mupirocin ointment (BACTROBAN) 2 % Apply 2 (two)  times daily. To affected areas 22 g 2  . pantoprazole (PROTONIX) 40 MG tablet TAKE 1 TABLET ONCE DAILY FOR REFLUX (needs OV for refills) 90 tablet 0  . pravastatin (PRAVACHOL) 40 MG tablet Take 1 tablet (40 mg total) by mouth daily. (needs OV for refills) 90 tablet 0  . predniSONE (STERAPRED UNI-PAK 21 TAB) 10 MG (21) TBPK tablet As directed x 6 days 21 tablet 0  . sertraline (ZOLOFT) 50 MG tablet Take 1 tablet (50 mg total) by mouth daily. Needs office visit for refills 90 tablet 0  . tamsulosin (FLOMAX) 0.4 MG CAPS capsule Take 2 capsules (0.8 mg total) by mouth  daily. 60 capsule 11   No current facility-administered medications on file prior to visit.    ROS Review of Systems  Constitutional: Negative.   HENT: Negative.   Eyes: Negative for visual disturbance.  Respiratory: Negative for cough and shortness of breath.   Cardiovascular: Negative for chest pain and leg swelling.  Gastrointestinal: Negative for abdominal pain, diarrhea, nausea and vomiting.  Genitourinary: Negative for difficulty urinating.  Musculoskeletal: Negative for arthralgias and myalgias.  Skin: Negative for rash.  Neurological: Negative for headaches.  Psychiatric/Behavioral: Negative for sleep disturbance.    Objective:  BP 130/82   Pulse 72   Temp 97.6 F (36.4 C) (Temporal)   Ht 5' 8"  (1.727 m)   Wt 191 lb 3.2 oz (86.7 kg)   BMI 29.07 kg/m   BP Readings from Last 3 Encounters:  10/20/19 130/82  03/31/19 124/68  01/06/19 (!) 170/89    Wt Readings from Last 3 Encounters:  10/20/19 191 lb 3.2 oz (86.7 kg)  03/31/19 191 lb 6.4 oz (86.8 kg)  01/06/19 191 lb (86.6 kg)     Physical Exam Vitals reviewed.  Constitutional:      Appearance: He is well-developed.  HENT:     Head: Normocephalic and atraumatic.     Right Ear: Tympanic membrane and external ear normal. No decreased hearing noted.     Left Ear: Tympanic membrane and external ear normal. No decreased hearing noted.     Mouth/Throat:     Pharynx: No oropharyngeal exudate or posterior oropharyngeal erythema.  Eyes:     Pupils: Pupils are equal, round, and reactive to light.  Cardiovascular:     Rate and Rhythm: Normal rate and regular rhythm.     Heart sounds: No murmur.  Pulmonary:     Effort: No respiratory distress.     Breath sounds: Normal breath sounds.  Abdominal:     General: Bowel sounds are normal.     Palpations: Abdomen is soft. There is no mass.     Tenderness: There is no abdominal tenderness.  Musculoskeletal:     Cervical back: Normal range of motion and neck supple.        Assessment & Plan:   Carey was seen today for follow-up.  Diagnoses and all orders for this visit:  Spondylosis of lumbar region without myelopathy or radiculopathy -     CBC with Differential/Platelet -     CMP14+EGFR -     traMADol (ULTRAM) 50 MG tablet; Take 1 tablet (50 mg total) by mouth 2 (two) times daily. For back pain  Essential hypertension -     CBC with Differential/Platelet -     CMP14+EGFR -     Urinalysis -     carvedilol (COREG) 25 MG tablet; Take 0.5 tablets (12.5 mg total) by mouth 2 (two) times daily. (Needs to be  seen before next refill)  Gastroesophageal reflux disease with esophagitis without hemorrhage -     CBC with Differential/Platelet -     CMP14+EGFR  Other emphysema (HCC) -     CBC with Differential/Platelet -     CMP14+EGFR  Mixed hyperlipidemia -     CBC with Differential/Platelet -     CMP14+EGFR -     Lipid panel  Benign prostatic hyperplasia with nocturia -     CBC with Differential/Platelet -     CMP14+EGFR -     PSA Total (Reflex To Free) -     Urinalysis  Chronic renal impairment, stage 3 (moderate), unspecified whether stage 3a or 3b CKD -     CBC with Differential/Platelet -     CMP14+EGFR -     allopurinol (ZYLOPRIM) 100 MG tablet; Take 1 tablet (100 mg total) by mouth daily. -     Uric acid  Chronic gout due to renal impairment without tophus, unspecified site -     CBC with Differential/Platelet -     CMP14+EGFR -     Uric acid  Screening for prostate cancer -     PSA Total (Reflex To Free)   Allergies as of 10/20/2019      Reactions   Neosporin [neomycin-bacitracin Zn-polymyx] Rash   Voltaren [diclofenac Sodium] Other (See Comments)   GI upset      Medication List       Accurate as of October 20, 2019  2:09 PM. If you have any questions, ask your nurse or doctor.        allopurinol 100 MG tablet Commonly known as: ZYLOPRIM Take 1 tablet (100 mg total) by mouth daily.   aspirin 81 MG chewable  tablet Chew 81 mg by mouth daily.   carvedilol 25 MG tablet Commonly known as: COREG Take 0.5 tablets (12.5 mg total) by mouth 2 (two) times daily. (Needs to be seen before next refill)   Clobetasol Prop Emollient Base 0.05 % emollient cream Commonly known as: Clobetasol Propionate E Apply 1 Tube topically 2 (two) times daily.   finasteride 5 MG tablet Commonly known as: PROSCAR Take 1 tablet (5 mg total) by mouth daily.   fluticasone 50 MCG/ACT nasal spray Commonly known as: FLONASE 2 spray daily each day each nostril   gabapentin 300 MG capsule Commonly known as: NEURONTIN TAKE  (1)  CAPSULE  TWICE DAILY.   hydroquinone 4 % cream APPLY TO AFFECTED AREAS TWICE A DAY   loratadine 10 MG tablet Commonly known as: CLARITIN TAKE ONE TABLET AT BEDTIME   mupirocin ointment 2 % Commonly known as: BACTROBAN Apply 2 (two) times daily. To affected areas   pantoprazole 40 MG tablet Commonly known as: PROTONIX TAKE 1 TABLET ONCE DAILY FOR REFLUX (needs OV for refills)   pravastatin 40 MG tablet Commonly known as: PRAVACHOL Take 1 tablet (40 mg total) by mouth daily. (needs OV for refills)   predniSONE 10 MG (21) Tbpk tablet Commonly known as: STERAPRED UNI-PAK 21 TAB As directed x 6 days   sertraline 50 MG tablet Commonly known as: ZOLOFT Take 1 tablet (50 mg total) by mouth daily. Needs office visit for refills   tamsulosin 0.4 MG Caps capsule Commonly known as: FLOMAX Take 2 capsules (0.8 mg total) by mouth daily.   traMADol 50 MG tablet Commonly known as: ULTRAM Take 1 tablet (50 mg total) by mouth 2 (two) times daily. For back pain Start taking on: Dec 19, 2019 What changed:  These instructions start on Dec 19, 2019. If you are unsure what to do until then, ask your doctor or other care provider. Changed by: Claretta Fraise, MD       Meds ordered this encounter  Medications  . carvedilol (COREG) 25 MG tablet    Sig: Take 0.5 tablets (12.5 mg total) by mouth 2  (two) times daily. (Needs to be seen before next refill)    Dispense:  30 tablet    Refill:  0  . allopurinol (ZYLOPRIM) 100 MG tablet    Sig: Take 1 tablet (100 mg total) by mouth daily.    Dispense:  30 tablet    Refill:  0  . traMADol (ULTRAM) 50 MG tablet    Sig: Take 1 tablet (50 mg total) by mouth 2 (two) times daily. For back pain    Dispense:  60 tablet    Refill:  3    Pain contract was executed today.  Tramadol prescription with 2 months delay for filling was sent in.  Other meds as noted above.  Patient appears stable with multiple diagnoses at this time.  Renal function in particular is unknown at this time but will evaluate once his blood work is available.  Follow-up: Return in about 6 months (around 04/21/2020) for Pain, hypertension, cholesterol, Arthritis, Compete physical.  Claretta Fraise, M.D.

## 2019-10-21 ENCOUNTER — Other Ambulatory Visit: Payer: Self-pay | Admitting: Family Medicine

## 2019-10-21 LAB — CBC WITH DIFFERENTIAL/PLATELET
Basophils Absolute: 0 10*3/uL (ref 0.0–0.2)
Basos: 1 %
EOS (ABSOLUTE): 0.5 10*3/uL — ABNORMAL HIGH (ref 0.0–0.4)
Eos: 9 %
Hematocrit: 38.1 % (ref 37.5–51.0)
Hemoglobin: 12.3 g/dL — ABNORMAL LOW (ref 13.0–17.7)
Immature Grans (Abs): 0 10*3/uL (ref 0.0–0.1)
Immature Granulocytes: 0 %
Lymphocytes Absolute: 1.8 10*3/uL (ref 0.7–3.1)
Lymphs: 35 %
MCH: 29.2 pg (ref 26.6–33.0)
MCHC: 32.3 g/dL (ref 31.5–35.7)
MCV: 91 fL (ref 79–97)
Monocytes Absolute: 0.5 10*3/uL (ref 0.1–0.9)
Monocytes: 9 %
Neutrophils Absolute: 2.4 10*3/uL (ref 1.4–7.0)
Neutrophils: 46 %
Platelets: 169 10*3/uL (ref 150–450)
RBC: 4.21 x10E6/uL (ref 4.14–5.80)
RDW: 14.9 % (ref 11.6–15.4)
WBC: 5.2 10*3/uL (ref 3.4–10.8)

## 2019-10-21 LAB — LIPID PANEL
Chol/HDL Ratio: 3.1 ratio (ref 0.0–5.0)
Cholesterol, Total: 154 mg/dL (ref 100–199)
HDL: 50 mg/dL (ref >39)
LDL Chol Calc (NIH): 86 mg/dL (ref 0–99)
Triglycerides: 96 mg/dL (ref 0–149)
VLDL Cholesterol Cal: 18 mg/dL (ref 5–40)

## 2019-10-21 LAB — CMP14+EGFR
ALT: 12 IU/L (ref 0–44)
AST: 13 IU/L (ref 0–40)
Albumin/Globulin Ratio: 1.9 (ref 1.2–2.2)
Albumin: 4.2 g/dL (ref 3.7–4.7)
Alkaline Phosphatase: 69 IU/L (ref 39–117)
BUN/Creatinine Ratio: 21 (ref 10–24)
BUN: 26 mg/dL (ref 8–27)
Bilirubin Total: 0.4 mg/dL (ref 0.0–1.2)
CO2: 22 mmol/L (ref 20–29)
Calcium: 9.1 mg/dL (ref 8.6–10.2)
Chloride: 103 mmol/L (ref 96–106)
Creatinine, Ser: 1.25 mg/dL (ref 0.76–1.27)
GFR calc Af Amer: 66 mL/min/{1.73_m2} (ref >59)
GFR calc non Af Amer: 57 mL/min/{1.73_m2} — ABNORMAL LOW (ref >59)
Globulin, Total: 2.2 g/dL (ref 1.5–4.5)
Glucose: 92 mg/dL (ref 65–99)
Potassium: 4.5 mmol/L (ref 3.5–5.2)
Sodium: 139 mmol/L (ref 134–144)
Total Protein: 6.4 g/dL (ref 6.0–8.5)

## 2019-10-21 LAB — URIC ACID: Uric Acid: 7.2 mg/dL (ref 3.8–8.4)

## 2019-10-21 LAB — PSA TOTAL (REFLEX TO FREE): Prostate Specific Ag, Serum: 0.9 ng/mL (ref 0.0–4.0)

## 2019-10-21 NOTE — Progress Notes (Signed)
Hello Jaymes,  Your lab result is normal and/or stable.Some minor variations that are not significant are commonly marked abnormal, but do not represent any medical problem for you.  Best regards, Sharon Rubis, M.D.

## 2019-10-23 LAB — TOXASSURE SELECT 13 (MW), URINE

## 2019-12-01 ENCOUNTER — Other Ambulatory Visit: Payer: Self-pay | Admitting: Family Medicine

## 2019-12-01 DIAGNOSIS — N183 Chronic kidney disease, stage 3 unspecified: Secondary | ICD-10-CM

## 2019-12-08 ENCOUNTER — Other Ambulatory Visit: Payer: Self-pay | Admitting: Family Medicine

## 2019-12-29 ENCOUNTER — Other Ambulatory Visit: Payer: Self-pay | Admitting: Family Medicine

## 2020-01-06 ENCOUNTER — Other Ambulatory Visit: Payer: Self-pay | Admitting: Family Medicine

## 2020-01-13 ENCOUNTER — Other Ambulatory Visit: Payer: Self-pay | Admitting: Family Medicine

## 2020-01-13 DIAGNOSIS — I10 Essential (primary) hypertension: Secondary | ICD-10-CM

## 2020-02-15 ENCOUNTER — Other Ambulatory Visit: Payer: Self-pay | Admitting: Family Medicine

## 2020-02-15 DIAGNOSIS — N401 Enlarged prostate with lower urinary tract symptoms: Secondary | ICD-10-CM

## 2020-02-15 DIAGNOSIS — R351 Nocturia: Secondary | ICD-10-CM

## 2020-03-01 ENCOUNTER — Other Ambulatory Visit: Payer: Self-pay | Admitting: Family Medicine

## 2020-03-01 DIAGNOSIS — N183 Chronic kidney disease, stage 3 unspecified: Secondary | ICD-10-CM

## 2020-03-08 ENCOUNTER — Other Ambulatory Visit: Payer: Self-pay | Admitting: Family Medicine

## 2020-03-28 ENCOUNTER — Other Ambulatory Visit: Payer: Self-pay | Admitting: Family Medicine

## 2020-04-12 ENCOUNTER — Other Ambulatory Visit: Payer: Self-pay | Admitting: Family Medicine

## 2020-04-12 DIAGNOSIS — I10 Essential (primary) hypertension: Secondary | ICD-10-CM

## 2020-04-19 ENCOUNTER — Other Ambulatory Visit: Payer: Self-pay | Admitting: Family Medicine

## 2020-04-21 ENCOUNTER — Other Ambulatory Visit: Payer: Self-pay

## 2020-04-21 ENCOUNTER — Ambulatory Visit (INDEPENDENT_AMBULATORY_CARE_PROVIDER_SITE_OTHER): Payer: Medicare Other | Admitting: Family Medicine

## 2020-04-21 ENCOUNTER — Encounter: Payer: Self-pay | Admitting: Family Medicine

## 2020-04-21 VITALS — BP 150/84 | HR 68 | Temp 97.0°F | Resp 20 | Ht 68.0 in | Wt 186.0 lb

## 2020-04-21 DIAGNOSIS — I10 Essential (primary) hypertension: Secondary | ICD-10-CM

## 2020-04-21 DIAGNOSIS — R351 Nocturia: Secondary | ICD-10-CM

## 2020-04-21 DIAGNOSIS — N401 Enlarged prostate with lower urinary tract symptoms: Secondary | ICD-10-CM | POA: Diagnosis not present

## 2020-04-21 DIAGNOSIS — Z23 Encounter for immunization: Secondary | ICD-10-CM | POA: Diagnosis not present

## 2020-04-21 DIAGNOSIS — M47816 Spondylosis without myelopathy or radiculopathy, lumbar region: Secondary | ICD-10-CM

## 2020-04-21 DIAGNOSIS — M1A30X0 Chronic gout due to renal impairment, unspecified site, without tophus (tophi): Secondary | ICD-10-CM

## 2020-04-21 DIAGNOSIS — K2100 Gastro-esophageal reflux disease with esophagitis, without bleeding: Secondary | ICD-10-CM | POA: Diagnosis not present

## 2020-04-21 DIAGNOSIS — E782 Mixed hyperlipidemia: Secondary | ICD-10-CM | POA: Diagnosis not present

## 2020-04-21 MED ORDER — GABAPENTIN 300 MG PO CAPS
ORAL_CAPSULE | ORAL | 1 refills | Status: DC
Start: 2020-04-21 — End: 2020-12-13

## 2020-04-21 MED ORDER — FINASTERIDE 5 MG PO TABS
5.0000 mg | ORAL_TABLET | Freq: Every day | ORAL | 1 refills | Status: DC
Start: 2020-04-21 — End: 2020-10-19

## 2020-04-21 MED ORDER — SERTRALINE HCL 50 MG PO TABS
50.0000 mg | ORAL_TABLET | Freq: Every day | ORAL | 1 refills | Status: DC
Start: 2020-04-21 — End: 2020-12-27

## 2020-04-21 MED ORDER — CARVEDILOL 25 MG PO TABS: ORAL_TABLET | ORAL | 3 refills | Status: DC

## 2020-04-21 MED ORDER — PANTOPRAZOLE SODIUM 40 MG PO TBEC
DELAYED_RELEASE_TABLET | ORAL | 1 refills | Status: DC
Start: 2020-04-21 — End: 2020-10-19

## 2020-04-21 MED ORDER — PRAVASTATIN SODIUM 40 MG PO TABS
40.0000 mg | ORAL_TABLET | Freq: Every day | ORAL | 1 refills | Status: DC
Start: 2020-04-21 — End: 2020-12-27

## 2020-04-21 MED ORDER — TAMSULOSIN HCL 0.4 MG PO CAPS: ORAL_CAPSULE | ORAL | 1 refills | Status: DC

## 2020-04-21 MED ORDER — TRAMADOL HCL 50 MG PO TABS: 50.0000 mg | ORAL_TABLET | Freq: Two times a day (BID) | ORAL | 5 refills | Status: DC

## 2020-04-21 NOTE — Progress Notes (Signed)
Subjective:  Patient ID: Taylor Cross, male    DOB: 03-Feb-1946  Age: 74 y.o. MRN: [id number]  CC: Medical Management of Chronic Issues   HPI Taylor Cross presents for follow-up of hypertension. Patient has no history of headache chest pain or shortness of breath or recent cough. Patient also denies symptoms of TIA such as numbness weakness lateralizing. Patient checks  blood pressure at home and has not had any elevated readings recently. Patient denies side effects from his medication. States taking it regularly. Patient in for follow-up of elevated cholesterol. Doing well without complaints on current medication. Denies side effects of statin including myalgia and arthralgia and nausea. Also in today for liver function testing. Currently no chest pain, shortness of breath or other cardiovascular related symptoms noted. Patient in for follow-up of GERD. Currently asymptomatic taking  PPI daily. There is no chest pain or heartburn. No hematemesis and no melena. No dysphagia or choking. Onset is remote. Progression is stable. Complicating factors, none. He also takes gabapentin and that seems to take the edge off of his chronic pains in the back.  His main concern currently is for his BPH.  He is taking Flomax and Proscar but still up 2-3 times at night.  He saw urology and was told he might want to get his prostate reamed out.  Patient is not confident of that procedure.  Patient states tramadol for low back pain.  He has had history of degenerative disc diseaseBut no herniated disc.  PDMP review shows daily morphine milligram equivalent to the 10.  No inappropriate prescription fills in the last 2 years.  Depression screen [facility] 2/9 04/21/2020 10/20/2019 06/11/2018  Decreased Interest 0 0 0  Down, Depressed, Hopeless 0 0 0  PHQ - 2 Score 0 0 0  Altered sleeping - - -  Tired, decreased energy - - -  Change in appetite - - -  Feeling bad or failure about yourself  - - -  Trouble concentrating -  - -  Moving slowly or fidgety/restless - - -  Suicidal thoughts - - -  PHQ-9 Score - - -    History Lee has a past medical history of Combined systolic and diastolic heart failure ([place]) and Hyperlipidemia.   He has a past surgical history that includes Tonsillectomy and Hernia repair.   His family history includes COPD in his father; Diabetes in his mother; Heart attack (age of onset: 63) in his brother; Heart disease in his mother; Heart failure in his mother.He reports that he has never smoked. He has never used smokeless tobacco. He reports that he does not drink alcohol and does not use drugs.    ROS Review of Systems  Constitutional: Negative for fever.  Respiratory: Negative for shortness of breath.   Cardiovascular: Negative for chest pain.  Musculoskeletal: Negative for arthralgias.  Skin: Negative for rash.    Objective:  BP (!) 150/84   Pulse 68   Temp (!) 97 F (36.1 C) (Temporal)   Resp 20   Ht 5' 8"  (1.727 m)   Wt 186 lb (84.4 kg)   SpO2 98%   BMI 28.28 kg/m   BP Readings from Last 3 Encounters:  04/21/20 (!) 150/84  10/20/19 130/82  03/31/19 124/68    Wt Readings from Last 3 Encounters:  04/21/20 186 lb (84.4 kg)  10/20/19 191 lb 3.2 oz (86.7 kg)  03/31/19 191 lb 6.4 oz (86.8 kg)     Physical Exam Vitals reviewed.  Constitutional:      Appearance: He is well-developed.  HENT:     Head: Normocephalic and atraumatic.     Right Ear: Tympanic membrane and external ear normal. No decreased hearing noted.     Left Ear: Tympanic membrane and external ear normal. No decreased hearing noted.     Mouth/Throat:     Pharynx: No oropharyngeal exudate or posterior oropharyngeal erythema.  Eyes:     Pupils: Pupils are equal, round, and reactive to light.  Cardiovascular:     Rate and Rhythm: Normal rate and regular rhythm.     Heart sounds: No murmur heard.   Pulmonary:     Effort: No respiratory distress.     Breath sounds: Normal breath sounds.    Abdominal:     General: Bowel sounds are normal.     Palpations: Abdomen is soft. There is no mass.     Tenderness: There is no abdominal tenderness.  Musculoskeletal:     Cervical back: Normal range of motion and neck supple.       Assessment & Plan:   Taylor Cross was seen today for medical management of chronic issues.  Diagnoses and all orders for this visit:  Need for immunization against influenza -     Flu Vaccine QUAD High Dose(Fluad)  Mixed hyperlipidemia -     CBC with Differential/Platelet -     CMP14+EGFR -     Uric acid -     Lipid panel  Essential hypertension -     CBC with Differential/Platelet -     CMP14+EGFR -     Uric acid -     Lipid panel  Gastroesophageal reflux disease with esophagitis without hemorrhage -     CBC with Differential/Platelet -     CMP14+EGFR -     Uric acid -     Lipid panel  Benign prostatic hyperplasia with nocturia -     CBC with Differential/Platelet -     CMP14+EGFR -     Uric acid -     Lipid panel  Chronic gout due to renal impairment without tophus, unspecified site     It appears that he is on essentially maximal treatment for BPH short of a surgical procedure.  We discussed the pros and cons.  He thinks he will try to wait a while before committing to that procedure.  I have discontinued Almond Lint. [place]'s predniSONE. I am also having him maintain his aspirin, Clobetasol Prop Emollient Base, hydroquinone, mupirocin ointment, traMADol, tamsulosin, loratadine, allopurinol, gabapentin, pantoprazole, pravastatin, sertraline, carvedilol, fluticasone, and finasteride.  Allergies as of 04/21/2020      Reactions   Neosporin [neomycin-bacitracin Zn-polymyx] Rash   Voltaren [diclofenac Sodium] Other (See Comments)   GI upset      Medication List       Accurate as of April 21, 2020 12:33 PM. If you have any questions, ask your nurse or doctor.        STOP taking these medications   predniSONE 10 MG (21) Tbpk  tablet Commonly known as: STERAPRED UNI-PAK 21 TAB Stopped by: Claretta Fraise, MD     TAKE these medications   allopurinol 100 MG tablet Commonly known as: ZYLOPRIM TAKE 1 TABLET DAILY   aspirin 81 MG chewable tablet Chew 81 mg by mouth daily.   carvedilol 25 MG tablet Commonly known as: COREG TAKE (1/2) TABLET TWICE DAILY.   Clobetasol Prop Emollient Base 0.05 % emollient cream Commonly known as: Clobetasol Propionate E Apply  1 Tube topically 2 (two) times daily.   finasteride 5 MG tablet Commonly known as: PROSCAR TAKE 1 TABLET DAILY   fluticasone 50 MCG/ACT nasal spray Commonly known as: FLONASE USE 2 SPRAYS IN EACH NOSTRIL ONCE DAILY.   gabapentin 300 MG capsule Commonly known as: NEURONTIN TAKE  (1)  CAPSULE  TWICE DAILY.   hydroquinone 4 % cream APPLY TO AFFECTED AREAS TWICE A DAY   loratadine 10 MG tablet Commonly known as: CLARITIN TAKE ONE TABLET AT BEDTIME   mupirocin ointment 2 % Commonly known as: BACTROBAN Apply 2 (two) times daily. To affected areas   pantoprazole 40 MG tablet Commonly known as: PROTONIX TAKE 1 TABLET ONCE DAILY FOR REFLUX   pravastatin 40 MG tablet Commonly known as: PRAVACHOL TAKE 1 TABLET DAILY   sertraline 50 MG tablet Commonly known as: ZOLOFT TAKE 1 TABLET DAILY   tamsulosin 0.4 MG Caps capsule Commonly known as: FLOMAX TAKE (2) CAPSULES DAILY.   traMADol 50 MG tablet Commonly known as: ULTRAM Take 1 tablet (50 mg total) by mouth 2 (two) times daily. For back pain        Follow-up: Return in about 6 months (around 10/19/2020).  Claretta Fraise, M.D.

## 2020-04-22 LAB — CBC WITH DIFFERENTIAL/PLATELET
Basophils Absolute: 0 10*3/uL (ref 0.0–0.2)
Basos: 1 %
EOS (ABSOLUTE): 0.2 10*3/uL (ref 0.0–0.4)
Eos: 4 %
Hematocrit: 37 % — ABNORMAL LOW (ref 37.5–51.0)
Hemoglobin: 12.4 g/dL — ABNORMAL LOW (ref 13.0–17.7)
Immature Grans (Abs): 0 10*3/uL (ref 0.0–0.1)
Immature Granulocytes: 0 %
Lymphocytes Absolute: 1.6 10*3/uL (ref 0.7–3.1)
Lymphs: 30 %
MCH: 29.7 pg (ref 26.6–33.0)
MCHC: 33.5 g/dL (ref 31.5–35.7)
MCV: 89 fL (ref 79–97)
Monocytes Absolute: 0.6 10*3/uL (ref 0.1–0.9)
Monocytes: 11 %
Neutrophils Absolute: 3 10*3/uL (ref 1.4–7.0)
Neutrophils: 54 %
Platelets: 154 10*3/uL (ref 150–450)
RBC: 4.17 x10E6/uL (ref 4.14–5.80)
RDW: 15.3 % (ref 11.6–15.4)
WBC: 5.6 10*3/uL (ref 3.4–10.8)

## 2020-04-22 LAB — LIPID PANEL
Chol/HDL Ratio: 3 ratio (ref 0.0–5.0)
Cholesterol, Total: 158 mg/dL (ref 100–199)
HDL: 53 mg/dL (ref >39)
LDL Chol Calc (NIH): 90 mg/dL (ref 0–99)
Triglycerides: 80 mg/dL (ref 0–149)
VLDL Cholesterol Cal: 15 mg/dL (ref 5–40)

## 2020-04-22 LAB — CMP14+EGFR
ALT: 12 IU/L (ref 0–44)
AST: 9 IU/L (ref 0–40)
Albumin/Globulin Ratio: 1.9 (ref 1.2–2.2)
Albumin: 4.4 g/dL (ref 3.7–4.7)
Alkaline Phosphatase: 75 IU/L (ref 44–121)
BUN/Creatinine Ratio: 27 — ABNORMAL HIGH (ref 10–24)
BUN: 35 mg/dL — ABNORMAL HIGH (ref 8–27)
Bilirubin Total: 0.5 mg/dL (ref 0.0–1.2)
CO2: 21 mmol/L (ref 20–29)
Calcium: 9.2 mg/dL (ref 8.6–10.2)
Chloride: 103 mmol/L (ref 96–106)
Creatinine, Ser: 1.31 mg/dL — ABNORMAL HIGH (ref 0.76–1.27)
GFR calc Af Amer: 62 mL/min/{1.73_m2} (ref >59)
GFR calc non Af Amer: 53 mL/min/{1.73_m2} — ABNORMAL LOW (ref >59)
Globulin, Total: 2.3 g/dL (ref 1.5–4.5)
Glucose: 94 mg/dL (ref 65–99)
Potassium: 4.5 mmol/L (ref 3.5–5.2)
Sodium: 139 mmol/L (ref 134–144)
Total Protein: 6.7 g/dL (ref 6.0–8.5)

## 2020-04-22 LAB — URIC ACID: Uric Acid: 7.3 mg/dL (ref 3.8–8.4)

## 2020-04-22 NOTE — Progress Notes (Signed)
Hello Sedric,  Your lab result is normal and/or stable.Some minor variations that are not significant are commonly marked abnormal, but do not represent any medical problem for you.  Best regards, Arley Salamone, M.D.

## 2020-05-16 ENCOUNTER — Other Ambulatory Visit: Payer: Self-pay | Admitting: Family Medicine

## 2020-06-07 ENCOUNTER — Other Ambulatory Visit: Payer: Self-pay | Admitting: Family Medicine

## 2020-06-27 ENCOUNTER — Other Ambulatory Visit: Payer: Self-pay | Admitting: Family Medicine

## 2020-06-27 DIAGNOSIS — N183 Chronic kidney disease, stage 3 unspecified: Secondary | ICD-10-CM

## 2020-07-19 ENCOUNTER — Inpatient Hospital Stay (HOSPITAL_COMMUNITY)
Admission: EM | Admit: 2020-07-19 | Discharge: 2020-07-22 | DRG: 291 | Disposition: A | Discharge status: Home / Self Care | Payer: Medicare Other | Attending: Family Medicine | Admitting: Family Medicine

## 2020-07-19 ENCOUNTER — Encounter (HOSPITAL_COMMUNITY): Payer: Self-pay | Admitting: Internal Medicine

## 2020-07-19 ENCOUNTER — Other Ambulatory Visit: Payer: Self-pay

## 2020-07-19 ENCOUNTER — Other Ambulatory Visit (HOSPITAL_COMMUNITY): Payer: Medicare Other

## 2020-07-19 ENCOUNTER — Inpatient Hospital Stay (HOSPITAL_COMMUNITY): Payer: Medicare Other

## 2020-07-19 ENCOUNTER — Emergency Department (HOSPITAL_COMMUNITY): Payer: Medicare Other

## 2020-07-19 DIAGNOSIS — J9601 Acute respiratory failure with hypoxia: Secondary | ICD-10-CM

## 2020-07-19 DIAGNOSIS — R0602 Shortness of breath: Secondary | ICD-10-CM | POA: Diagnosis not present

## 2020-07-19 DIAGNOSIS — J438 Other emphysema: Secondary | ICD-10-CM

## 2020-07-19 DIAGNOSIS — Z20822 Contact with and (suspected) exposure to covid-19: Secondary | ICD-10-CM | POA: Diagnosis present

## 2020-07-19 DIAGNOSIS — I5043 Acute on chronic combined systolic (congestive) and diastolic (congestive) heart failure: Secondary | ICD-10-CM | POA: Diagnosis present

## 2020-07-19 DIAGNOSIS — J189 Pneumonia, unspecified organism: Secondary | ICD-10-CM | POA: Diagnosis present

## 2020-07-19 DIAGNOSIS — F419 Anxiety disorder, unspecified: Secondary | ICD-10-CM | POA: Diagnosis present

## 2020-07-19 DIAGNOSIS — J9602 Acute respiratory failure with hypercapnia: Secondary | ICD-10-CM | POA: Diagnosis present

## 2020-07-19 DIAGNOSIS — N401 Enlarged prostate with lower urinary tract symptoms: Secondary | ICD-10-CM

## 2020-07-19 DIAGNOSIS — Z79899 Other long term (current) drug therapy: Secondary | ICD-10-CM

## 2020-07-19 DIAGNOSIS — Z881 Allergy status to other antibiotic agents status: Secondary | ICD-10-CM

## 2020-07-19 DIAGNOSIS — Z886 Allergy status to analgesic agent status: Secondary | ICD-10-CM | POA: Diagnosis not present

## 2020-07-19 DIAGNOSIS — R351 Nocturia: Secondary | ICD-10-CM | POA: Diagnosis present

## 2020-07-19 DIAGNOSIS — I493 Ventricular premature depolarization: Secondary | ICD-10-CM | POA: Diagnosis present

## 2020-07-19 DIAGNOSIS — I340 Nonrheumatic mitral (valve) insufficiency: Secondary | ICD-10-CM | POA: Diagnosis not present

## 2020-07-19 DIAGNOSIS — N1831 Chronic kidney disease, stage 3a: Secondary | ICD-10-CM | POA: Diagnosis present

## 2020-07-19 DIAGNOSIS — Z825 Family history of asthma and other chronic lower respiratory diseases: Secondary | ICD-10-CM

## 2020-07-19 DIAGNOSIS — K2100 Gastro-esophageal reflux disease with esophagitis, without bleeding: Secondary | ICD-10-CM | POA: Diagnosis present

## 2020-07-19 DIAGNOSIS — I130 Hypertensive heart and chronic kidney disease with heart failure and stage 1 through stage 4 chronic kidney disease, or unspecified chronic kidney disease: Principal | ICD-10-CM | POA: Diagnosis present

## 2020-07-19 DIAGNOSIS — I509 Heart failure, unspecified: Secondary | ICD-10-CM

## 2020-07-19 DIAGNOSIS — I5021 Acute systolic (congestive) heart failure: Secondary | ICD-10-CM | POA: Diagnosis not present

## 2020-07-19 DIAGNOSIS — J439 Emphysema, unspecified: Secondary | ICD-10-CM | POA: Diagnosis present

## 2020-07-19 DIAGNOSIS — E785 Hyperlipidemia, unspecified: Secondary | ICD-10-CM | POA: Diagnosis present

## 2020-07-19 DIAGNOSIS — F32A Depression, unspecified: Secondary | ICD-10-CM | POA: Diagnosis present

## 2020-07-19 DIAGNOSIS — Z7982 Long term (current) use of aspirin: Secondary | ICD-10-CM | POA: Diagnosis not present

## 2020-07-19 DIAGNOSIS — Z8249 Family history of ischemic heart disease and other diseases of the circulatory system: Secondary | ICD-10-CM

## 2020-07-19 DIAGNOSIS — Z79891 Long term (current) use of opiate analgesic: Secondary | ICD-10-CM

## 2020-07-19 DIAGNOSIS — I10 Essential (primary) hypertension: Secondary | ICD-10-CM

## 2020-07-19 DIAGNOSIS — J449 Chronic obstructive pulmonary disease, unspecified: Secondary | ICD-10-CM | POA: Diagnosis present

## 2020-07-19 DIAGNOSIS — I350 Nonrheumatic aortic (valve) stenosis: Secondary | ICD-10-CM | POA: Diagnosis not present

## 2020-07-19 HISTORY — DX: Acute respiratory failure with hypercapnia: J96.02

## 2020-07-19 HISTORY — DX: Acute systolic (congestive) heart failure: I50.21

## 2020-07-19 HISTORY — DX: Acute respiratory failure with hypoxia: J96.01

## 2020-07-19 LAB — COMPREHENSIVE METABOLIC PANEL
ALT: 15 U/L (ref 0–44)
AST: 19 U/L (ref 15–41)
Albumin: 3.5 g/dL (ref 3.5–5.0)
Alkaline Phosphatase: 64 U/L (ref 38–126)
Anion gap: 8 (ref 5–15)
BUN: 18 mg/dL (ref 8–23)
CO2: 20 mmol/L — ABNORMAL LOW (ref 22–32)
Calcium: 7.6 mg/dL — ABNORMAL LOW (ref 8.9–10.3)
Chloride: 111 mmol/L (ref 98–111)
Creatinine, Ser: 1.19 mg/dL (ref 0.61–1.24)
GFR, Estimated: >60 mL/min (ref >60)
Glucose, Bld: 145 mg/dL — ABNORMAL HIGH (ref 70–99)
Potassium: 3.8 mmol/L (ref 3.5–5.1)
Sodium: 139 mmol/L (ref 135–145)
Total Bilirubin: 0.7 mg/dL (ref 0.3–1.2)
Total Protein: 6.1 g/dL — ABNORMAL LOW (ref 6.5–8.1)

## 2020-07-19 LAB — CBC WITH DIFFERENTIAL/PLATELET
Abs Immature Granulocytes: 0.04 10*3/uL (ref 0.00–0.07)
Basophils Absolute: 0 10*3/uL (ref 0.0–0.1)
Basophils Relative: 0 %
Eosinophils Absolute: 0.4 10*3/uL (ref 0.0–0.5)
Eosinophils Relative: 4 %
HCT: 38.5 % — ABNORMAL LOW (ref 39.0–52.0)
Hemoglobin: 11.5 g/dL — ABNORMAL LOW (ref 13.0–17.0)
Immature Granulocytes: 0 %
Lymphocytes Relative: 21 %
Lymphs Abs: 1.9 10*3/uL (ref 0.7–4.0)
MCH: 28.3 pg (ref 26.0–34.0)
MCHC: 29.9 g/dL — ABNORMAL LOW (ref 30.0–36.0)
MCV: 94.8 fL (ref 80.0–100.0)
Monocytes Absolute: 0.8 10*3/uL (ref 0.1–1.0)
Monocytes Relative: 9 %
Neutro Abs: 6.1 10*3/uL (ref 1.7–7.7)
Neutrophils Relative %: 66 %
Platelets: 193 10*3/uL (ref 150–400)
RBC: 4.06 MIL/uL — ABNORMAL LOW (ref 4.22–5.81)
RDW: 16 % — ABNORMAL HIGH (ref 11.5–15.5)
WBC: 9.3 10*3/uL (ref 4.0–10.5)
nRBC: 0 % (ref 0.0–0.2)

## 2020-07-19 LAB — FERRITIN: Ferritin: 15 ng/mL — ABNORMAL LOW (ref 24–336)

## 2020-07-19 LAB — LACTIC ACID, PLASMA
Lactic Acid, Venous: 2.2 mmol/L (ref 0.5–1.9)
Lactic Acid, Venous: 1.5 mmol/L (ref 0.5–1.9)

## 2020-07-19 LAB — CBG MONITORING, ED: Glucose-Capillary: 175 mg/dL — ABNORMAL HIGH (ref 70–99)

## 2020-07-19 LAB — TRIGLYCERIDES: Triglycerides: 57 mg/dL (ref <150)

## 2020-07-19 LAB — FIBRINOGEN: Fibrinogen: 391 mg/dL (ref 210–475)

## 2020-07-19 LAB — C-REACTIVE PROTEIN: CRP: 0.7 mg/dL (ref <1.0)

## 2020-07-19 LAB — TROPONIN I (HIGH SENSITIVITY): Troponin I (High Sensitivity): 11 ng/L (ref <18)

## 2020-07-19 LAB — MRSA PCR SCREENING: MRSA by PCR: NEGATIVE

## 2020-07-19 LAB — D-DIMER, QUANTITATIVE: D-Dimer, Quant: 0.77 ug/mL-FEU — ABNORMAL HIGH (ref 0.00–0.50)

## 2020-07-19 LAB — RESP PANEL BY RT-PCR (FLU A&B, COVID) ARPGX2
Influenza A by PCR: NEGATIVE
Influenza B by PCR: NEGATIVE
SARS Coronavirus 2 by RT PCR: NEGATIVE

## 2020-07-19 LAB — LACTATE DEHYDROGENASE: LDH: 161 U/L (ref 98–192)

## 2020-07-19 LAB — BRAIN NATRIURETIC PEPTIDE: B Natriuretic Peptide: 913 pg/mL — ABNORMAL HIGH (ref 0.0–100.0)

## 2020-07-19 LAB — PROCALCITONIN: Procalcitonin: <0.1 ng/mL

## 2020-07-19 MED ORDER — FINASTERIDE 5 MG PO TABS
5.0000 mg | ORAL_TABLET | Freq: Every day | ORAL | Status: DC
  Administered 2020-07-19 – 2020-07-22 (×4): 5 mg via ORAL
  Filled 2020-07-19 (×4): qty 1

## 2020-07-19 MED ORDER — IPRATROPIUM BROMIDE 0.02 % IN SOLN
0.5000 mg | Freq: Four times a day (QID) | RESPIRATORY_TRACT | Status: DC
  Administered 2020-07-19 (×2): 0.5 mg via RESPIRATORY_TRACT
  Filled 2020-07-19 (×2): qty 2.5

## 2020-07-19 MED ORDER — PRAVASTATIN SODIUM 40 MG PO TABS
40.0000 mg | ORAL_TABLET | Freq: Every day | ORAL | Status: DC
  Administered 2020-07-19 – 2020-07-22 (×4): 40 mg via ORAL
  Filled 2020-07-19 (×4): qty 1

## 2020-07-19 MED ORDER — ALBUTEROL SULFATE HFA 108 (90 BASE) MCG/ACT IN AERS
4.0000 | INHALATION_SPRAY | Freq: Once | RESPIRATORY_TRACT | Status: AC
  Administered 2020-07-19: 4 via RESPIRATORY_TRACT
  Filled 2020-07-19: qty 6.7

## 2020-07-19 MED ORDER — FLUTICASONE PROPIONATE 50 MCG/ACT NA SUSP
2.0000 | Freq: Every day | NASAL | Status: DC
  Administered 2020-07-19 – 2020-07-22 (×4): 2 via NASAL
  Filled 2020-07-19 (×2): qty 16

## 2020-07-19 MED ORDER — CARVEDILOL 12.5 MG PO TABS
12.5000 mg | ORAL_TABLET | Freq: Two times a day (BID) | ORAL | Status: DC
  Administered 2020-07-19 – 2020-07-22 (×6): 12.5 mg via ORAL
  Filled 2020-07-19 (×6): qty 1

## 2020-07-19 MED ORDER — TAMSULOSIN HCL 0.4 MG PO CAPS
0.8000 mg | ORAL_CAPSULE | Freq: Every day | ORAL | Status: DC
  Administered 2020-07-19 – 2020-07-22 (×4): 0.8 mg via ORAL
  Filled 2020-07-19 (×4): qty 2

## 2020-07-19 MED ORDER — ACETAMINOPHEN 325 MG PO TABS: 650.0000 mg | ORAL_TABLET | Freq: Four times a day (QID) | ORAL | Status: DC | PRN

## 2020-07-19 MED ORDER — ALLOPURINOL 100 MG PO TABS
100.0000 mg | ORAL_TABLET | Freq: Every day | ORAL | Status: DC
  Administered 2020-07-19 – 2020-07-22 (×4): 100 mg via ORAL
  Filled 2020-07-19 (×4): qty 1

## 2020-07-19 MED ORDER — TRAMADOL HCL 50 MG PO TABS
50.0000 mg | ORAL_TABLET | Freq: Two times a day (BID) | ORAL | Status: DC | PRN
  Administered 2020-07-19 – 2020-07-22 (×2): 50 mg via ORAL
  Filled 2020-07-19 (×2): qty 1

## 2020-07-19 MED ORDER — ONDANSETRON HCL 4 MG PO TABS: 4.0000 mg | ORAL_TABLET | Freq: Four times a day (QID) | ORAL | Status: DC | PRN

## 2020-07-19 MED ORDER — ASPIRIN 81 MG PO CHEW
81.0000 mg | CHEWABLE_TABLET | Freq: Every day | ORAL | Status: DC
  Administered 2020-07-19 – 2020-07-22 (×4): 81 mg via ORAL
  Filled 2020-07-19 (×4): qty 1

## 2020-07-19 MED ORDER — ALBUTEROL (5 MG/ML) CONTINUOUS INHALATION SOLN
10.0000 mg/h | INHALATION_SOLUTION | RESPIRATORY_TRACT | Status: DC
  Administered 2020-07-19: 10 mg/h via RESPIRATORY_TRACT
  Filled 2020-07-19: qty 20

## 2020-07-19 MED ORDER — LORATADINE 10 MG PO TABS
10.0000 mg | ORAL_TABLET | Freq: Every day | ORAL | Status: DC
  Administered 2020-07-19 – 2020-07-21 (×3): 10 mg via ORAL
  Filled 2020-07-19 (×3): qty 1

## 2020-07-19 MED ORDER — LEVALBUTEROL HCL 0.63 MG/3ML IN NEBU
0.6300 mg | INHALATION_SOLUTION | Freq: Four times a day (QID) | RESPIRATORY_TRACT | Status: DC
  Administered 2020-07-19 (×2): 0.63 mg via RESPIRATORY_TRACT
  Filled 2020-07-19: qty 3

## 2020-07-19 MED ORDER — PANTOPRAZOLE SODIUM 40 MG PO TBEC
40.0000 mg | DELAYED_RELEASE_TABLET | Freq: Every day | ORAL | Status: DC
  Administered 2020-07-19 – 2020-07-22 (×4): 40 mg via ORAL
  Filled 2020-07-19 (×4): qty 1

## 2020-07-19 MED ORDER — ENOXAPARIN SODIUM 40 MG/0.4ML ~~LOC~~ SOLN
40.0000 mg | SUBCUTANEOUS | Status: DC
  Administered 2020-07-19 – 2020-07-22 (×4): 40 mg via SUBCUTANEOUS
  Filled 2020-07-19 (×4): qty 0.4

## 2020-07-19 MED ORDER — FUROSEMIDE 10 MG/ML IJ SOLN
40.0000 mg | Freq: Two times a day (BID) | INTRAMUSCULAR | Status: DC
  Administered 2020-07-19 – 2020-07-21 (×5): 40 mg via INTRAVENOUS
  Filled 2020-07-19 (×6): qty 4

## 2020-07-19 MED ORDER — ACETAMINOPHEN 650 MG RE SUPP: 650.0000 mg | Freq: Four times a day (QID) | RECTAL | Status: DC | PRN

## 2020-07-19 MED ORDER — FUROSEMIDE 10 MG/ML IJ SOLN
40.0000 mg | Freq: Once | INTRAMUSCULAR | Status: AC
  Administered 2020-07-19: 40 mg via INTRAVENOUS
  Filled 2020-07-19: qty 4

## 2020-07-19 MED ORDER — ALBUTEROL SULFATE (2.5 MG/3ML) 0.083% IN NEBU: 2.5000 mg | INHALATION_SOLUTION | RESPIRATORY_TRACT | Status: DC | PRN

## 2020-07-19 MED ORDER — NITROGLYCERIN IN D5W 200-5 MCG/ML-% IV SOLN
5.0000 ug/min | INTRAVENOUS | Status: DC
  Filled 2020-07-19: qty 250

## 2020-07-19 MED ORDER — ONDANSETRON HCL 4 MG/2ML IJ SOLN: 4.0000 mg | Freq: Four times a day (QID) | INTRAMUSCULAR | Status: DC | PRN

## 2020-07-19 MED ORDER — CHLORHEXIDINE GLUCONATE CLOTH 2 % EX PADS
6.0000 | MEDICATED_PAD | Freq: Every day | CUTANEOUS | Status: DC
  Administered 2020-07-19 – 2020-07-22 (×3): 6 via TOPICAL

## 2020-07-19 MED ORDER — GABAPENTIN 300 MG PO CAPS
300.0000 mg | ORAL_CAPSULE | Freq: Two times a day (BID) | ORAL | Status: DC
  Administered 2020-07-19 – 2020-07-22 (×7): 300 mg via ORAL
  Filled 2020-07-19 (×7): qty 1

## 2020-07-19 MED ORDER — SERTRALINE HCL 50 MG PO TABS
50.0000 mg | ORAL_TABLET | Freq: Every day | ORAL | Status: DC
  Administered 2020-07-19 – 2020-07-22 (×4): 50 mg via ORAL
  Filled 2020-07-19 (×4): qty 1

## 2020-07-19 NOTE — H&P (Signed)
HISTORY AND PHYSICAL       PATIENT DETAILS Name: Taylor Cross Age: 74 y.o. Sex: male Date of Birth: Jun 30, 1946 Admit Date: 07/19/2020 IPJ:ASNKNL, [name], MD   Patient coming from: Home   CHIEF COMPLAINT:  Gradually worsening shortness of breath-x7 days  HPI: [name] is a 74 y.o. male with medical history significant of chronic systolic heart failure, BPH, COPD presenting to the ED for 7-day history of worsening shortness of breath.  Claims that approximately 7 days back-he had a "head cold".  He then started gradually developing shortness of breath-it was mostly exertional at first-he also noted that he was having trouble lying flat-and needed around 3-4 pillows to sleep at night.  Does acknowledge a productive cough.  He denies any fever.  His shortness of breath worsened overnight-EMS was called-his O2 saturation was in the 70s on room air-he was put on 6 L of oxygen via [place]-and transported to the emergency room.  While in the emergency room-further imaging studies were performed-he was thought to have decompensated heart failure-placed on BiPAP-the hospitalist service was asked to admit this patient for further evaluation and treatment.  Patient denies any headache, fever, chest pain, nausea, vomiting, diarrhea, abdominal pain.  ED Course: Elevated BNP-chest x-ray consistent with CHF-placed on BiPAP-given Lasix 40 mg x 1.  Note: Lives at: Home Mobility:Independent Chronic Indwelling Foley:no   REVIEW OF SYSTEMS:  Constitutional:   No  weight loss, night sweats,  Fevers, chills, fatigue.  HEENT:    No headaches, Dysphagia,Tooth/dental problems,Sore throat,  No sneezing, itching, ear ache, nasal congestion, post nasal drip  Cardio-vascular: No chest pain,lower extremity edema, anasarca, palpitations  GI:  No heartburn, indigestion, abdominal pain, nausea, vomiting, diarrhea, melena or hematochezia  Resp: No hemoptysis,plueritic chest pain.   Skin:   No rash or lesions.  GU:  No dysuria, change in color of urine, no urgency or frequency.  No flank pain.  Musculoskeletal: No joint pain or swelling.  No decreased range of motion.  No back pain.  Endocrine: No heat intolerance, no cold intolerance, no polyuria, no polydipsia  Psych: No change in mood or affect. No depression or anxiety.  No memory loss.   ALLERGIES:   Allergies  Allergen Reactions  . Neosporin [Neomycin-Bacitracin Zn-Polymyx] Rash  . Voltaren [Diclofenac Sodium] Other (See Comments)    GI upset    PAST MEDICAL HISTORY: Past Medical History:  Diagnosis Date  . Combined systolic and diastolic heart failure (HCC)   . Hyperlipidemia     PAST SURGICAL HISTORY: Past Surgical History:  Procedure Laterality Date  . HERNIA REPAIR    . TONSILLECTOMY      MEDICATIONS AT HOME: Prior to Admission medications   Medication Sig Start Date End Date Taking? Authorizing Provider  allopurinol (ZYLOPRIM) 100 MG tablet TAKE 1 TABLET DAILY Patient taking differently: Take 100 mg by mouth daily. 06/27/20  Yes [name], MD  aspirin 81 MG chewable tablet Chew 81 mg by mouth daily.   Yes [provider]  carvedilol (COREG) 25 MG tablet TAKE (1/2) TABLET TWICE DAILY. Patient taking differently: Take 12.5 mg by mouth in the morning and at bedtime. 04/21/20  Yes [name], MD  finasteride (PROSCAR) 5 MG tablet Take 1 tablet (5 mg total) by mouth daily. 04/21/20  Yes [name], [name], MD  fluticasone (FLONASE) 50 MCG/ACT nasal spray USE 2 SPRAYS IN EACH NOSTRIL ONCE DAILY. Patient taking differently: Place 2 sprays into both nostrils  daily. 06/07/20  Yes [name], [name], MD  gabapentin (NEURONTIN) 300 MG capsule TAKE  (1)  CAPSULE  TWICE DAILY. Patient taking differently: Take 300 mg by mouth 2 (two) times daily. 04/21/20  Yes [name], [name], MD  loratadine (CLARITIN) 10 MG tablet TAKE ONE TABLET AT BEDTIME Patient taking differently: Take 10 mg by mouth at  bedtime. 05/16/20  Yes [name], [name], MD  pantoprazole (PROTONIX) 40 MG tablet TAKE 1 TABLET ONCE DAILY FOR REFLUX Patient taking differently: Take 40 mg by mouth daily. 04/21/20  Yes [name], MD  pravastatin (PRAVACHOL) 40 MG tablet Take 1 tablet (40 mg total) by mouth daily. 04/21/20  Yes [name], MD  sertraline (ZOLOFT) 50 MG tablet Take 1 tablet (50 mg total) by mouth daily. 04/21/20  Yes [name], [name], MD  tamsulosin (FLOMAX) 0.4 MG CAPS capsule TAKE (2) CAPSULES DAILY. Patient taking differently: Take 0.8 mg by mouth daily. 04/21/20  Yes [name], [name], MD  traMADol (ULTRAM) 50 MG tablet Take 1 tablet (50 mg total) by mouth 2 (two) times daily. For back pain 04/21/20  Yes [name], [name], MD  hydroquinone 4 % cream APPLY TO AFFECTED AREAS TWICE A DAY Patient taking differently: Apply 1 application topically in the morning and at bedtime. 09/09/18   [name], MD  mupirocin ointment (BACTROBAN) 2 % Apply 2 (two) times daily. To affected areas Patient taking differently: Apply 1 application topically 2 (two) times daily. To affected areas 07/16/19   [name], MD    FAMILY HISTORY: Family History  Problem Relation Age of Onset  . Heart disease Mother   . Diabetes Mother   . Heart failure Mother   . COPD Father   . Heart attack Brother 66     SOCIAL HISTORY:  reports that he has never smoked. He has never used smokeless tobacco. He reports that he does not drink alcohol and does not use drugs.  PHYSICAL EXAM: Blood pressure 114/66, pulse (!) 40, temperature 98.9 F (37.2 C), temperature source Oral, resp. rate 19, height  (1.727 m), weight 88.5 kg, SpO2 97 %.  General appearance :Awake, alert, not in any distress.  Comfortable on BiPAP. Eyes:, pupils equally reactive to light and accomodation,no scleral icterus.Pink conjunctiva HEENT: Atraumatic and Normocephalic Neck: supple, no JVD.  Resp: Bibasilar rales CVS: S1 S2 regular, no murmurs.  GI: Bowel  sounds present, Non tender and not distended with no gaurding, rigidity or rebound. Extremities: B/L Lower Ext shows no edema, both legs are warm to touch Neurology:  speech clear,Non focal, sensation is grossly intact. Psychiatric: Normal judgment and insight. Alert and oriented x 3. Normal mood. Musculoskeletal:gait appears to be normal.No digital cyanosis Skin:No Rash, warm and dry Wounds:N/A  LABS ON ADMISSION:  I have personally reviewed following labs and imaging studies  CBC: Recent Labs  Lab 07/19/20 0742  WBC 9.3  NEUTROABS 6.1  HGB 11.5*  HCT 38.5*  MCV 94.8  PLT 193    Basic Metabolic Panel: Recent Labs  Lab 07/19/20 0742  NA 139  K 3.8  CL 111  CO2 20*  GLUCOSE 145*  BUN 18  CREATININE 1.19  CALCIUM 7.6*    GFR: Estimated Creatinine Clearance: 58.9 mL/min (by C-G formula based on SCr of 1.19 mg/dL).  Liver Function Tests: Recent Labs  Lab 07/19/20 0742  AST 19  ALT 15  ALKPHOS 64  BILITOT 0.7  PROT 6.1*  ALBUMIN 3.5   No results for input(s): LIPASE, AMYLASE in the last 168 hours.  No results for input(s): AMMONIA in the last 168 hours.  Coagulation Profile: No results for input(s): INR, PROTIME in the last 168 hours.  Cardiac Enzymes: No results for input(s): CKTOTAL, CKMB, CKMBINDEX, TROPONINI in the last 168 hours.  BNP (last 3 results) No results for input(s): PROBNP in the last 8760 hours.  HbA1C: No results for input(s): HGBA1C in the last 72 hours.  CBG: Recent Labs  Lab 07/19/20 0844  GLUCAP 175*    Lipid Profile: Recent Labs    07/19/20 0742  TRIG 57    Thyroid Function Tests: No results for input(s): TSH, T4TOTAL, FREET4, T3FREE, THYROIDAB in the last 72 hours.  Anemia Panel: Recent Labs    07/19/20 0742  FERRITIN 15*    Urine analysis:    Component Value Date/Time   APPEARANCEUR Clear 10/20/2019 1210   GLUCOSEU Negative 10/20/2019 1210   BILIRUBINUR Negative 10/20/2019 1210   PROTEINUR Negative  10/20/2019 1210   NITRITE Negative 10/20/2019 1210   LEUKOCYTESUR Negative 10/20/2019 1210    Sepsis Labs: Lactic Acid, Venous    Component Value Date/Time   LATICACIDVEN 1.5 07/19/2020 0941     Microbiology: Recent Results (from the past 240 hour(s))  Resp Panel by RT-PCR (Flu A&B, Covid) Nasopharyngeal Swab     Status: None   Collection Time: 07/19/20  7:44 AM   Specimen: Nasopharyngeal Swab; Nasopharyngeal(NP) swabs in vial transport medium  Result Value Ref Range Status   SARS Coronavirus 2 by RT PCR NEGATIVE NEGATIVE Final    Comment: (NOTE) SARS-CoV-2 target nucleic acids are NOT DETECTED.  The SARS-CoV-2 RNA is generally detectable in upper respiratory specimens during the acute phase of infection. The lowest concentration of SARS-CoV-2 viral copies this assay can detect is 138 copies/mL. A negative result does not preclude SARS-Cov-2 infection and should not be used as the sole basis for treatment or other patient management decisions. A negative result may occur with  improper specimen collection/handling, submission of specimen other than nasopharyngeal swab, presence of viral mutation(s) within the areas targeted by this assay, and inadequate number of viral copies(<138 copies/mL). A negative result must be combined with clinical observations, patient history, and epidemiological information. The expected result is Negative.  Fact Sheet for Patients:  BloggerCourse.com  Fact Sheet for Healthcare Providers:  SeriousBroker.it  This test is no t yet approved or cleared by the [place] FDA and  has been authorized for detection and/or diagnosis of SARS-CoV-2 by FDA under an Emergency Use Authorization (EUA). This EUA will remain  in effect (meaning this test can be used) for the duration of the COVID-19 declaration under Section 564(b)(1) of the Act, 21 U.S.C.section 360bbb-3(b)(1), unless the authorization is  terminated  or revoked sooner.       Influenza A by PCR NEGATIVE NEGATIVE Final   Influenza B by PCR NEGATIVE NEGATIVE Final    Comment: (NOTE) The Xpert Xpress SARS-CoV-2/FLU/RSV plus assay is intended as an aid in the diagnosis of influenza from Nasopharyngeal swab specimens and should not be used as a sole basis for treatment. Nasal washings and aspirates are unacceptable for Xpert Xpress SARS-CoV-2/FLU/RSV testing.  Fact Sheet for Patients: BloggerCourse.com  Fact Sheet for Healthcare Providers: SeriousBroker.it  This test is not yet approved or cleared by the [place] FDA and has been authorized for detection and/or diagnosis of SARS-CoV-2 by FDA under an Emergency Use Authorization (EUA). This EUA will remain in effect (meaning this test can be used) for the duration of the COVID-19 declaration  under Section 564(b)(1) of the Act, 21 U.S.C. section 360bbb-3(b)(1), unless the authorization is terminated or revoked.  Performed at [facility], [address], [place], [place] [address]   Blood Culture (routine x 2)     Status: None (Preliminary result)   Collection Time: 07/19/20  8:15 AM   Specimen: BLOOD  Result Value Ref Range Status   Specimen Description BLOOD RIGHT ANTECUBITAL  Final   Special Requests   Final    BOTTLES DRAWN AEROBIC AND ANAEROBIC Blood Culture adequate volume Performed at [facility], [address], [place], [place] [address]    Culture PENDING  Incomplete   Report Status PENDING  Incomplete      RADIOLOGIC STUDIES ON ADMISSION: DG Chest Portable 1 View  Result Date: 07/19/2020 CLINICAL DATA:  Shortness of breath for 1 week EXAM: PORTABLE CHEST 1 VIEW COMPARISON:  10/06/2013 FINDINGS: Cardiac shadow is mildly enlarged. Mild increased vascular congestion is noted with interstitial edema consistent with mild CHF. Small effusions are noted bilaterally left greater than right. No bony  abnormality is noted. IMPRESSION: Changes of mild CHF with small effusions. Electronically Signed   By: Alcide Clever M.D.   On: 07/19/2020 08:16    I have personally reviewed images of chest xray-diffuse interstitial infiltrates-likely pulmonary edema.  EKG:  Personally reviewed.  Sinus rhythm-positive PVCs-some artifacts.  ASSESSMENT AND PLAN: Acute hypoxic respiratory failure: Likely secondary to decompensated systolic heart failure-continue BiPAP-continue Lasix.  Once appropriately diuresed-attempt to titrate off BiPAP.  If hypoxia does not improve with diuresis-then may require more imaging and further work-up.  Acute on chronic systolic heart failure: Followed by Dr. Areta Haber echo June 2020 showed significant improvement in systolic function-although he does not have peripheral edema-suspect he has pulmonary edema based on chest x-ray findings and elevated BNP.  Continue IV Lasix-continue Coreg-recheck echo.  Follow closely-at some point based on echo findings-May require cardiology evaluation.  COPD: Not in exacerbation-as noted above-shortness of breath is likely due to decompensated heart failure.  Continue bronchodilators.  BPH: Continue finasteride and Flomax  GERD: Continue PPI   Further plan will depend as patient's clinical course evolves and further radiologic and laboratory data become available. Patient will be monitored closely.  CONSULTS: None   DVT Prophylaxis: Prophylactic Lovenox   Code Status: Full Code  Disposition Plan:  Discharge back home possibly in 2-3 days, depending on clinical course  Admission status:  Inpatient going to ICU  The patient is critically ill with multiple organ system failure and requires high complexity decision making for assessment and support, frequent evaluation and titration of therapies, advanced monitoring, review of radiographic studies and interpretation of complex data. *    Total time spent  55 minutes.Greater than  50% of this time was spent in counseling, explanation of diagnosis, planning of further management, and coordination of care.  Severity of illness:  The appropriate patient status for this patient is INPATIENT. Inpatient status is judged to be reasonable and necessary in order to provide the required intensity of service to ensure the patient's safety. The patient's presenting symptoms, physical exam findings, and initial radiographic and laboratory data in the context of their chronic comorbidities is felt to place them at high risk for further clinical deterioration. Furthermore, it is not anticipated that the patient will be medically stable for discharge from the hospital within 2 midnights of admission. The following factors support the patient status of inpatient.   " The patient's presenting symptoms include shortness of breath " The  worrisome physical exam findings include severe hypoxia requiring BiPAP " The initial radiographic and laboratory data are worrisome because of chest x-ray with pulmonary edema " The chronic co-morbidities include chronic systolic heart failure   * I certify that at the point of admission it is my clinical judgment that the patient will require inpatient hospital care spanning beyond 2 midnights from the point of admission due to high intensity of service, high risk for further deterioration and high frequency of surveillance required.**  Jorma Tassinari [facility] Pager [phone]  If 7PM-7AM, please contact night-coverage  Please page via [email or web address]  Go to [email or web address] and use [facility]'s universal password to access. If you do not have the password, please contact the hospital operator.  Locate the [facility] provider you are looking for under [facility] and page to a number that you can be directly reached. If you still have difficulty reaching the provider, please page the [facility] (Director on Call) for the Hospitalists listed on amion for  assistance.  07/19/2020, 10:23 AM

## 2020-07-19 NOTE — ED Provider Notes (Signed)
[facility] EMERGENCY DEPARTMENT Provider Note  CSN: [id number] Arrival date & time: 07/19/20 0732    History Chief Complaint  Patient presents with  . Shortness of Breath    Pt has SOB for the last week progressively getting worse, pt has all 3 COVID vaccines, skin warm and pale, pt alert and oriented.  Pt vomiting during triage    HPI  Taylor Cross is a 74 y.o. male with prior history of CHF (last Echo in 2020 had normal EF) reports increasing SOB and cough for the last several days, cough is occasionally productive and 'like the croup'. Not associated with fever. He has had some post-tussive emesis and diarrhea. No loss of taste or smell. EMS reports SpO2 in 70s on RA, improved with [place]. Arrived on 6L, titrated down to 4L.     Past Medical History:  Diagnosis Date  . Combined systolic and diastolic heart failure (HCC)   . Hyperlipidemia     Past Surgical History:  Procedure Laterality Date  . HERNIA REPAIR    . TONSILLECTOMY      Family History  Problem Relation Age of Onset  . Heart disease Mother   . Diabetes Mother   . Heart failure Mother   . COPD Father   . Heart attack Brother 52    Social History   Tobacco Use  . Smoking status: Never Smoker  . Smokeless tobacco: Never Used  Vaping Use  . Vaping Use: Never used  Substance Use Topics  . Alcohol use: No    Alcohol/week: 0.0 standard drinks  . Drug use: No     Home Medications Prior to Admission medications   Medication Sig Start Date End Date Taking? Authorizing Provider  allopurinol (ZYLOPRIM) 100 MG tablet TAKE 1 TABLET DAILY Patient taking differently: Take 100 mg by mouth daily. 06/27/20   [name], MD  aspirin 81 MG chewable tablet Chew 81 mg by mouth daily.    [provider]  carvedilol (COREG) 25 MG tablet TAKE (1/2) TABLET TWICE DAILY. Patient taking differently: Take 12.5 mg by mouth in the morning and at bedtime. 04/21/20   [name], MD  finasteride (PROSCAR) 5  MG tablet Take 1 tablet (5 mg total) by mouth daily. 04/21/20   [name], MD  fluticasone (FLONASE) 50 MCG/ACT nasal spray USE 2 SPRAYS IN EACH NOSTRIL ONCE DAILY. Patient taking differently: Place 2 sprays into both nostrils daily. 06/07/20   [name], MD  gabapentin (NEURONTIN) 300 MG capsule TAKE  (1)  CAPSULE  TWICE DAILY. Patient taking differently: Take 300 mg by mouth 2 (two) times daily. 04/21/20   [name], MD  hydroquinone 4 % cream APPLY TO AFFECTED AREAS TWICE A DAY Patient taking differently: Apply 1 application topically in the morning and at bedtime. 09/09/18   [name], MD  loratadine (CLARITIN) 10 MG tablet TAKE ONE TABLET AT BEDTIME Patient taking differently: Take 10 mg by mouth at bedtime. 05/16/20   [name], MD  mupirocin ointment (BACTROBAN) 2 % Apply 2 (two) times daily. To affected areas Patient taking differently: Apply 1 application topically 2 (two) times daily. To affected areas 07/16/19   [name], MD  pantoprazole (PROTONIX) 40 MG tablet TAKE 1 TABLET ONCE DAILY FOR REFLUX Patient taking differently: Take 40 mg by mouth daily. 04/21/20   [name], MD  pravastatin (PRAVACHOL) 40 MG tablet Take 1 tablet (40 mg total) by mouth daily. 04/21/20   [name], MD  sertraline (ZOLOFT)  50 MG tablet Take 1 tablet (50 mg total) by mouth daily. 04/21/20   [name], MD  tamsulosin (FLOMAX) 0.4 MG CAPS capsule TAKE (2) CAPSULES DAILY. Patient taking differently: Take 0.8 mg by mouth daily. 04/21/20   [name], MD  traMADol (ULTRAM) 50 MG tablet Take 1 tablet (50 mg total) by mouth 2 (two) times daily. For back pain 04/21/20   [name], MD     Allergies    Neosporin [neomycin-bacitracin zn-polymyx] and Voltaren [diclofenac sodium]   Review of Systems   Review of Systems A comprehensive review of systems was completed and negative except as noted in HPI.    Physical Exam BP (!) 179/112   Pulse 99   Temp 98.9 F  (37.2 C) (Oral)   Resp (!) 31   Ht 5\' 8"  (1.727 m)   Wt 88.5 kg   SpO2 96%   BMI 29.65 kg/m   Physical Exam Vitals and nursing note reviewed.  Constitutional:      Appearance: Normal appearance.  HENT:     Head: Normocephalic and atraumatic.     Nose: Nose normal.     Mouth/Throat:     Mouth: Mucous membranes are moist.  Eyes:     Extraocular Movements: Extraocular movements intact.     Conjunctiva/sclera: Conjunctivae normal.  Cardiovascular:     Rate and Rhythm: Normal rate.  Pulmonary:     Effort: Pulmonary effort is normal. Tachypnea present.     Breath sounds: Wheezing and rhonchi present.  Abdominal:     General: Abdomen is flat.     Palpations: Abdomen is soft.     Tenderness: There is no abdominal tenderness.  Musculoskeletal:        General: No swelling. Normal range of motion.     Cervical back: Neck supple.     Right lower leg: Edema present.     Left lower leg: Edema present.  Skin:    General: Skin is warm and dry.  Neurological:     General: No focal deficit present.     Mental Status: He is alert.  Psychiatric:        Mood and Affect: Mood normal.      ED Results / Procedures / Treatments   Labs (all labs ordered are listed, but only abnormal results are displayed) Labs Reviewed  LACTIC ACID, PLASMA - Abnormal; Notable for the following components:      Result Value   Lactic Acid, Venous 2.2 (*)    All other components within normal limits  CBC WITH DIFFERENTIAL/PLATELET - Abnormal; Notable for the following components:   RBC 4.06 (*)    Hemoglobin 11.5 (*)    HCT 38.5 (*)    MCHC 29.9 (*)    RDW 16.0 (*)    All other components within normal limits  COMPREHENSIVE METABOLIC PANEL - Abnormal; Notable for the following components:   CO2 20 (*)    Glucose, Bld 145 (*)    Calcium 7.6 (*)    Total Protein 6.1 (*)    All other components within normal limits  D-DIMER, QUANTITATIVE (NOT AT [facility]) - Abnormal; Notable for the following  components:   D-Dimer, Quant 0.77 (*)    All other components within normal limits  BRAIN NATRIURETIC PEPTIDE - Abnormal; Notable for the following components:   B Natriuretic Peptide 913.0 (*)    All other components within normal limits  CBG MONITORING, ED - Abnormal; Notable for the following components:   Glucose-Capillary 175 (*)  All other components within normal limits  RESP PANEL BY RT-PCR (FLU A&B, COVID) ARPGX2  CULTURE, BLOOD (ROUTINE X 2)  CULTURE, BLOOD (ROUTINE X 2)  PROCALCITONIN  LACTATE DEHYDROGENASE  TRIGLYCERIDES  FIBRINOGEN  LACTIC ACID, PLASMA  FERRITIN  C-REACTIVE PROTEIN  TROPONIN I (HIGH SENSITIVITY)    EKG EKG Interpretation  Date/Time:  Tuesday July 19 2020 07:38:12 EST Ventricular Rate:  105 PR Interval:    QRS Duration: 121 QT Interval:  328 QTC Calculation: 419 R Axis:   103 Text Interpretation: Sinus rhythm with PVCs in a pattern of bigeminy Nonspecific intraventricular conduction delay Lateral infarct, age indeterminate Anteroseptal infarct, old No old tracing to compare Confirmed by Susy Frizzle [phone]) on 07/19/2020 7:50:36 AM   Radiology DG Chest Portable 1 View  Result Date: 07/19/2020 CLINICAL DATA:  Shortness of breath for 1 week EXAM: PORTABLE CHEST 1 VIEW COMPARISON:  10/06/2013 FINDINGS: Cardiac shadow is mildly enlarged. Mild increased vascular congestion is noted with interstitial edema consistent with mild CHF. Small effusions are noted bilaterally left greater than right. No bony abnormality is noted. IMPRESSION: Changes of mild CHF with small effusions. Electronically Signed   By: Alcide Clever M.D.   On: 07/19/2020 08:16    Procedures .Critical Care Performed by: Pollyann Savoy, MD Authorized by: Pollyann Savoy, MD   Critical care provider statement:    Critical care time (minutes):  45   Critical care time was exclusive of:  Separately billable procedures and treating other patients   Critical care was  necessary to treat or prevent imminent or life-threatening deterioration of the following conditions:  Respiratory failure and cardiac failure   Critical care was time spent personally by me on the following activities:  Discussions with consultants, evaluation of patient's response to treatment, examination of patient, ordering and performing treatments and interventions, ordering and review of laboratory studies, ordering and review of radiographic studies, pulse oximetry, re-evaluation of patient's condition, obtaining history from patient or surrogate and review of old charts    Medications Ordered in the ED Medications  nitroGLYCERIN 50 mg in dextrose 5 % 250 mL (0.2 mg/mL) infusion (has no administration in time range)  albuterol (VENTOLIN HFA) 108 (90 Base) MCG/ACT inhaler 4 puff (4 puffs Inhalation Given 07/19/20 0828)  furosemide (LASIX) injection 40 mg (40 mg Intravenous Given 07/19/20 0852)     MDM Rules/Calculators/A&P MDM Patient with SOB, cough and hypoxia, concerning for Covid, consider CHF as well. Labs and CXR ordered. Continue with supplemental O2, albuterol MDI for wheezing. No history of breathing problems/COPD or tobacco use. Patient has had Covid vaccine + booster. Also has had flu vaccine.  ED Course  I have reviewed the triage vital signs and the nursing notes.  Pertinent labs & imaging results that were available during my care of the patient were reviewed by me and considered in my medical decision making (see chart for details).  Clinical Course as of 07/19/20 0855  Tue Jul 19, 2020  0823 CBC with mild anemia not significantly changed from baseline.  [CS]  0824 CXR images and results reviewed, most consistent with CHF.  [CS]  0825 Lactic acid is mildly elevated, suspect this is from hypoxia and not from sepsis given no hypotension, no fever and no leukocytosis. Given concern for CHF vs Covid, will not give fluid bolus at this time.  [CS]  0837 BNP is moderately  elevated, no prior available to compare.  [CS]  0840 Procal, LDH and CMP are unremarkable. First  Trop is neg.  [CS]  [id number] Covid is neg. Will treat as CHF. Lasix ordered. Plan admission for same.  [CS]  [phone] Patient still breathing ~30bpm, will ask RT to initiate BiPAP for work of breathing. Spoke with Dr. Jerral Ralph, Hospitalist, who will evaluate for admission. He requests nitro drip to reduce preload/improve BP.  [CS]    Clinical Course User Index [CS] Pollyann Savoy, MD    Final Clinical Impression(s) / ED Diagnoses Final diagnoses:  Acute congestive heart failure, unspecified heart failure type (HCC)  Acute respiratory failure with hypoxia [facility])    Rx / DC Orders ED Discharge Orders    None       Pollyann Savoy, MD 07/19/20 [phone]

## 2020-07-19 NOTE — ED Triage Notes (Signed)
SOB progressively worse over the last week, pt vomiting in triage, skin warm and pale, alert and oriented. 97% 4 L, pt demanding air in triage.

## 2020-07-19 NOTE — ED Notes (Signed)
Date and time results received: 07/19/20 0824   Test: LACTIC ACID Critical Value: 2.2  Name of Provider Notified: Bernette Mayers  Orders Received? Or Actions Taken?: Orders

## 2020-07-20 ENCOUNTER — Encounter (HOSPITAL_COMMUNITY): Payer: Self-pay | Admitting: Internal Medicine

## 2020-07-20 ENCOUNTER — Inpatient Hospital Stay (HOSPITAL_COMMUNITY): Payer: Medicare Other

## 2020-07-20 DIAGNOSIS — J9602 Acute respiratory failure with hypercapnia: Secondary | ICD-10-CM

## 2020-07-20 DIAGNOSIS — J9601 Acute respiratory failure with hypoxia: Secondary | ICD-10-CM

## 2020-07-20 DIAGNOSIS — I350 Nonrheumatic aortic (valve) stenosis: Secondary | ICD-10-CM

## 2020-07-20 DIAGNOSIS — I340 Nonrheumatic mitral (valve) insufficiency: Secondary | ICD-10-CM

## 2020-07-20 LAB — ECHOCARDIOGRAM COMPLETE
AR max vel: 1.17 cm2
AV Area VTI: 1.21 cm2
AV Area mean vel: 1.2 cm2
AV Mean grad: 10 mmHg
AV Peak grad: 18.3 mmHg
Ao pk vel: 2.14 m/s
Area-P 1/2: 3.26 cm2
Height: 68 in
MV M vel: 3.93 m/s
MV Peak grad: 61.8 mmHg
S' Lateral: 4.26 cm
Weight: 2836 oz

## 2020-07-20 LAB — CBC
HCT: 34.6 % — ABNORMAL LOW (ref 39.0–52.0)
Hemoglobin: 10.8 g/dL — ABNORMAL LOW (ref 13.0–17.0)
MCH: 28.6 pg (ref 26.0–34.0)
MCHC: 31.2 g/dL (ref 30.0–36.0)
MCV: 91.8 fL (ref 80.0–100.0)
Platelets: 185 10*3/uL (ref 150–400)
RBC: 3.77 MIL/uL — ABNORMAL LOW (ref 4.22–5.81)
RDW: 16.1 % — ABNORMAL HIGH (ref 11.5–15.5)
WBC: 7.6 10*3/uL (ref 4.0–10.5)
nRBC: 0 % (ref 0.0–0.2)

## 2020-07-20 LAB — COMPREHENSIVE METABOLIC PANEL
ALT: 13 U/L (ref 0–44)
AST: 14 U/L — ABNORMAL LOW (ref 15–41)
Albumin: 3.5 g/dL (ref 3.5–5.0)
Alkaline Phosphatase: 60 U/L (ref 38–126)
Anion gap: 9 (ref 5–15)
BUN: 24 mg/dL — ABNORMAL HIGH (ref 8–23)
CO2: 25 mmol/L (ref 22–32)
Calcium: 8.6 mg/dL — ABNORMAL LOW (ref 8.9–10.3)
Chloride: 103 mmol/L (ref 98–111)
Creatinine, Ser: 1.33 mg/dL — ABNORMAL HIGH (ref 0.61–1.24)
GFR, Estimated: 56 mL/min — ABNORMAL LOW (ref >60)
Glucose, Bld: 100 mg/dL — ABNORMAL HIGH (ref 70–99)
Potassium: 3.8 mmol/L (ref 3.5–5.1)
Sodium: 137 mmol/L (ref 135–145)
Total Bilirubin: 1.1 mg/dL (ref 0.3–1.2)
Total Protein: 6.1 g/dL — ABNORMAL LOW (ref 6.5–8.1)

## 2020-07-20 LAB — TROPONIN I (HIGH SENSITIVITY): Troponin I (High Sensitivity): 36 ng/L — ABNORMAL HIGH (ref <18)

## 2020-07-20 MED ORDER — IPRATROPIUM BROMIDE 0.02 % IN SOLN
0.5000 mg | Freq: Three times a day (TID) | RESPIRATORY_TRACT | Status: DC
  Administered 2020-07-20 – 2020-07-21 (×4): 0.5 mg via RESPIRATORY_TRACT
  Filled 2020-07-20 (×4): qty 2.5

## 2020-07-20 MED ORDER — GUAIFENESIN-DM 100-10 MG/5ML PO SYRP
5.0000 mL | ORAL_SOLUTION | ORAL | Status: DC | PRN
  Administered 2020-07-20 – 2020-07-21 (×3): 5 mL via ORAL
  Filled 2020-07-20 (×3): qty 5

## 2020-07-20 MED ORDER — LEVALBUTEROL HCL 0.63 MG/3ML IN NEBU
0.6300 mg | INHALATION_SOLUTION | Freq: Three times a day (TID) | RESPIRATORY_TRACT | Status: DC
  Administered 2020-07-20 – 2020-07-21 (×4): 0.63 mg via RESPIRATORY_TRACT
  Filled 2020-07-20 (×4): qty 3

## 2020-07-20 MED ORDER — POLYVINYL ALCOHOL 1.4 % OP SOLN
1.0000 [drp] | OPHTHALMIC | Status: DC | PRN
  Filled 2020-07-20: qty 15

## 2020-07-20 NOTE — TOC Initial Note (Signed)
Transition of Care [facility]) - Initial/Assessment Note   Patient Details  Name: Taylor Cross MRN: [id number] Date of Birth: Feb 13, 1946  Transition of [facility]) CM/SW Contact:    Ewing Schlein, LCSW Phone Number: 07/20/2020, 2:48 PM  Clinical Narrative: Patient is a 74 year old male who was admitted for acute respiratory failure with hypoxia and hypercapnia and acute systolic CHF. Patient has a history of emphysema lung, hypertension, GERD, and benign prostatic hyperplasia with nocturia. TOC received consult for CHF screening. CSW completed assessment with patient. Per patient, he resides at home alone and is independent with his ADLs at baseline. Patient reported he has no DME in the home nor a history of HH services. Patient currently provides his own transportation to appointments. Patient is able to afford his medications each month, which he takes as prescribed.  Per CHF screening, patient follows a heart healthy diet and limits salt in his food. Patient reported he has not been told to restrict his fluid intake or to take daily weights at this time, but sees his cardiologist regularly. TOC to follow for discharge needs.  Expected Discharge Plan: Home/Self Care Barriers to Discharge: Continued Medical Work up  Patient Goals and CMS Choice Patient states their goals for this hospitalization and ongoing recovery are:: Discharge home CMS Medicare.gov Compare Post Acute Care list provided to:: Patient Choice offered to / list presented to : NA  Expected Discharge Plan and Services Expected Discharge Plan: Home/Self Care In-house Referral: Clinical Social Work Discharge Planning Services: NA Post Acute Care Choice: NA Living arrangements for the past 2 months: Single Family Home                 DME Arranged: N/A DME Agency: NA HH Arranged: NA HH Agency: NA  Prior Living Arrangements/Services Living arrangements for the past 2 months: Single Family Home Lives with:: Self Patient  language and need for interpreter reviewed:: Yes Do you feel safe going back to the place where you live?: Yes      Need for Family Participation in Patient Care: No (Comment) Care giver support system in place?: Yes (comment) Criminal Activity/Legal Involvement Pertinent to Current Situation/Hospitalization: No - Comment as needed  Activities of Daily Living Home Assistive Devices/Equipment: None ADL Screening (condition at time of admission) Patient's cognitive ability adequate to safely complete daily activities?: Yes Is the patient deaf or have difficulty hearing?: No Does the patient have difficulty seeing, even when wearing glasses/contacts?: No Does the patient have difficulty concentrating, remembering, or making decisions?: No Patient able to express need for assistance with ADLs?: Yes Does the patient have difficulty dressing or bathing?: No Independently performs ADLs?: Yes (appropriate for developmental age) Does the patient have difficulty walking or climbing stairs?: No Weakness of Legs: None Weakness of Arms/Hands: None  Emotional Assessment Appearance:: Appears stated age Attitude/Demeanor/Rapport: Engaged Affect (typically observed): Accepting Orientation: : Oriented to Self,Oriented to Place,Oriented to  Time,Oriented to Situation Alcohol / Substance Use: Not Applicable Psych Involvement: No (comment)  Admission diagnosis:  Acute respiratory failure with hypoxia (HCC) [J96.01] Acute respiratory failure with hypoxemia (HCC) [J96.01] Acute congestive heart failure, unspecified heart failure type [facility]) [I50.9] Patient Active Problem List   Diagnosis Date Noted  . Acute respiratory failure with hypoxia and hypercapnia (HCC) 07/19/2020  . Acute systolic CHF (congestive heart failure) (HCC) 07/19/2020  . Benign prostatic hyperplasia with nocturia 10/20/2019  . Spondylosis of lumbar region without myelopathy or radiculopathy 11/10/2017  . Gastroesophageal reflux  disease with esophagitis  06/11/2017  . Hernia of abdominal cavity 08/16/2015  . Essential hypertension 08/04/2015  . Inguinal hernia 08/04/2015  . Snoring 02/04/2014  . Emphysema lung (HCC) 12/02/2013  . Systolic dysfunction 11/11/2013  . Diastolic dysfunction 11/11/2013  . PVC's (premature ventricular contractions) 10/20/2013  . Mixed hyperlipidemia 10/20/2013   PCP:  [name], MD Pharmacy:   [facility] - [place] - [address] [place] [place] [address] Phone: [phone] Fax: [phone]  Readmission Risk Interventions No flowsheet data found.

## 2020-07-20 NOTE — Progress Notes (Signed)
Patient Demographics:    Taylor Cross, is a 74 y.o. male, DOB - 07/29/1946, ZCH:[id number]  Admit date - 07/19/2020   Admitting Physician Dewayne Shorter Levora Dredge, MD  Outpatient Primary MD for the patient is [name], MD  LOS - 1   Chief Complaint  Patient presents with  . Shortness of Breath    Pt has SOB for the last week progressively getting worse, pt has all 3 COVID vaccines, skin warm and pale, pt alert and oriented.  Pt vomiting during triage        Subjective:    Taylor Cross today has no fevers, no emesis,  No chest pain,   -Did well on BiPAP overnight -Dyspnea on exertion persist, orthopnea has improved, -Oxygen requirement improving down to 3 L of oxygen via nasal cannula -Voiding well with fluid balance negative over 1300 mL  Assessment  & Plan :    Principal Problem:   Acute respiratory failure with hypoxia and hypercapnia (HCC) Active Problems:   Emphysema lung (HCC)   Essential hypertension   Gastroesophageal reflux disease with esophagitis   Benign prostatic hyperplasia with nocturia   Acute systolic CHF (congestive heart failure) (HCC)  Brief Summary:- 74 y.o. male with medical history significant of chronic systolic heart failure, BPH, COPD admitted on 07/19/2020 with acute hypoxic respiratory failure secondary to acute on chronic systolic dysfunction CHF exacerbation  A/p 1)Acute on Chronic combined diastolic and systolic Heart Failure- able to come off Bipap, now on 3L/min of [place],  -Orthopnea improved, dyspnea on exertion persist -c/n IV Lasix 40 mg every 12 hours Coreg 12.5 mg bid,  -Daily weight and fluid input and output monitoring -Fluid balance is negative over -Echo from June 2020 had shown improved EF up to 50 to 55% with diastolic dysfunction -Repeat echo from 07/20/2020 pending  2)Acute hypoxic respiratory failure--improving with diuresis with IV Lasix,  see #1 above  3)COPD--- no acute extubation this time, continue bronchodilators  4)GERD--stable, continue Protonix  Daily  5)HLD-stable, continue pravastatin 40 mg daily  6)BPH-stable, voiding okay, continue Flomax and Proscar  7) anxiety/depression, stable, continue Zoloft 50 mg daily  8)CKD IIIA--creatinine currently 1.33 which is close to patient's baseline, monitor renal function closely with IV diuresis - renally adjust medications, avoid nephrotoxic agents / dehydration  / hypotension  9) acute on chronic anemia--MCV and MCH WNL -Hemoglobin is down to 10.8 from a baseline usually above 12, suspect hemodilution rather than acute blood loss -Monitor closely   Disposition/Need for in-Hospital Stay- patient unable to be discharged at this time due to --acute hypoxic respiratory failure secondary to systolic dysfunction CHF requiring IV diuresis and monitoring of renal function and electrolytes, as well as supplemental oxygen  Status is: Inpatient  Remains inpatient appropriate because:Please see above   Disposition: The patient is from: Home              Anticipated d/c is to: Home              Anticipated d/c date is: 2 days              Patient currently is not medically stable to d/c. Barriers: Not Clinically Stable-   Code Status :  -  Code Status: Full Code  Family Communication:    NA (patient is alert, awake and coherent)   Consults  :  na  DVT Prophylaxis  :   - SCDs   enoxaparin (LOVENOX) injection 40 mg Start: 07/19/20 1200    Lab Results  Component Value Date   PLT 185 07/20/2020    Inpatient Medications  Scheduled Meds: . allopurinol  100 mg Oral Daily  . aspirin  81 mg Oral Daily  . carvedilol  12.5 mg Oral BID WC  . Chlorhexidine Gluconate Cloth  6 each Topical Daily  . enoxaparin (LOVENOX) injection  40 mg Subcutaneous Q24H  . finasteride  5 mg Oral Daily  . fluticasone  2 spray Each Nare Daily  . furosemide  40 mg Intravenous Q12H  .  gabapentin  300 mg Oral BID  . ipratropium  0.5 mg Nebulization TID  . levalbuterol  0.63 mg Nebulization TID  . loratadine  10 mg Oral QHS  . pantoprazole  40 mg Oral Daily  . pravastatin  40 mg Oral Daily  . sertraline  50 mg Oral Daily  . tamsulosin  0.8 mg Oral Daily   Continuous Infusions: PRN Meds:.acetaminophen **OR** acetaminophen, albuterol, ondansetron **OR** ondansetron (ZOFRAN) IV, traMADol    Anti-infectives (From admission, onward)   None        Objective:   Vitals:   07/20/20 0500 07/20/20 0600 07/20/20 0736 07/20/20 0800  BP: 115/69 124/80  (!) 153/88  Pulse: (!) 50 (!) 45  (!) 49  Resp: 15 18  19   Temp:      TempSrc:      SpO2: 93% 90% 94% 90%  Weight:      Height:        Wt Readings from Last 3 Encounters:  07/20/20 80.4 kg  04/21/20 84.4 kg  10/20/19 86.7 kg     Intake/Output Summary (Last 24 hours) at 07/20/2020 1022 Last data filed at 07/20/2020 0800 Gross per 24 hour  Intake 480 ml  Output 1785 ml  Net -1305 ml     Physical Exam  Gen:- Awake Alert, no conversational dyspnea HEENT:- [place].AT, No sclera icterus Nose- [place] 3L/min Neck-Supple Neck, .  Lungs-diminished in bases with faint bibasilar rales  CV- S1, S2 normal, regular  Abd-  +ve B.Sounds, Abd Soft, No tenderness,    Extremity/Skin:- No significant edema, pedal pulses present  Psych-affect is appropriate, oriented x3 Neuro-no new focal deficits, no tremors   Data Review:   Micro Results Recent Results (from the past 240 hour(s))  Resp Panel by RT-PCR (Flu A&B, Covid) Nasopharyngeal Swab     Status: None   Collection Time: 07/19/20  7:44 AM   Specimen: Nasopharyngeal Swab; Nasopharyngeal(NP) swabs in vial transport medium  Result Value Ref Range Status   SARS Coronavirus 2 by RT PCR NEGATIVE NEGATIVE Final    Comment: (NOTE) SARS-CoV-2 target nucleic acids are NOT DETECTED.  The SARS-CoV-2 RNA is generally detectable in upper respiratory specimens during the acute  phase of infection. The lowest concentration of SARS-CoV-2 viral copies this assay can detect is 138 copies/mL. A negative result does not preclude SARS-Cov-2 infection and should not be used as the sole basis for treatment or other patient management decisions. A negative result may occur with  improper specimen collection/handling, submission of specimen other than nasopharyngeal swab, presence of viral mutation(s) within the areas targeted by this assay, and inadequate number of viral copies(<138 copies/mL). A negative result must be combined with clinical observations, patient history, and epidemiological information.  The expected result is Negative.  Fact Sheet for Patients:  BloggerCourse.com  Fact Sheet for Healthcare Providers:  SeriousBroker.it  This test is no t yet approved or cleared by the [place] FDA and  has been authorized for detection and/or diagnosis of SARS-CoV-2 by FDA under an Emergency Use Authorization (EUA). This EUA will remain  in effect (meaning this test can be used) for the duration of the COVID-19 declaration under Section 564(b)(1) of the Act, 21 U.S.C.section 360bbb-3(b)(1), unless the authorization is terminated  or revoked sooner.       Influenza A by PCR NEGATIVE NEGATIVE Final   Influenza B by PCR NEGATIVE NEGATIVE Final    Comment: (NOTE) The Xpert Xpress SARS-CoV-2/FLU/RSV plus assay is intended as an aid in the diagnosis of influenza from Nasopharyngeal swab specimens and should not be used as a sole basis for treatment. Nasal washings and aspirates are unacceptable for Xpert Xpress SARS-CoV-2/FLU/RSV testing.  Fact Sheet for Patients: BloggerCourse.com  Fact Sheet for Healthcare Providers: SeriousBroker.it  This test is not yet approved or cleared by the [place] FDA and has been authorized for detection and/or diagnosis of  SARS-CoV-2 by FDA under an Emergency Use Authorization (EUA). This EUA will remain in effect (meaning this test can be used) for the duration of the COVID-19 declaration under Section 564(b)(1) of the Act, 21 U.S.C. section 360bbb-3(b)(1), unless the authorization is terminated or revoked.  Performed at [facility], [address] [place], [place] [address]   Blood Culture (routine x 2)     Status: None (Preliminary result)   Collection Time: 07/19/20  8:06 AM   Specimen: BLOOD  Result Value Ref Range Status   Specimen Description BLOOD RIGHT ANTECUBITAL  Final   Special Requests   Final    BOTTLES DRAWN AEROBIC AND ANAEROBIC Blood Culture adequate volume   Culture   Final    NO GROWTH < 24 HOURS Performed at [facility], [address] [place], [place] [address]    Report Status PENDING  Incomplete  Blood Culture (routine x 2)     Status: None (Preliminary result)   Collection Time: 07/19/20  8:15 AM   Specimen: BLOOD  Result Value Ref Range Status   Specimen Description BLOOD RIGHT ANTECUBITAL  Final   Special Requests   Final    BOTTLES DRAWN AEROBIC AND ANAEROBIC Blood Culture adequate volume   Culture   Final    NO GROWTH < 24 HOURS Performed at [facility] [address], [place], [place] [address]    Report Status PENDING  Incomplete  MRSA PCR Screening     Status: None   Collection Time: 07/19/20 12:15 PM   Specimen: Nasal Mucosa; Nasopharyngeal  Result Value Ref Range Status   MRSA by PCR NEGATIVE NEGATIVE Final    Comment:        The GeneXpert MRSA Assay (FDA approved for NASAL specimens only), is one component of a comprehensive MRSA colonization surveillance program. It is not intended to diagnose MRSA infection nor to guide or monitor treatment for MRSA infections. Performed at [facility], [address] [place], [place] [address]     Radiology Reports DG Chest [address] [place] 1 View  Result Date: 07/20/2020 CLINICAL DATA:  Shortness of breath. EXAM:  PORTABLE CHEST 1 VIEW COMPARISON:  July 19, 2020. FINDINGS: Pulmonary vascular congestion. Mildly enlarged cardiac silhouette. Aortic atherosclerosis. Worsening dense retrocardiac opacity. Improved interstitial prominence. Suspected small bilateral pleural effusions. No visible pneumothorax. IMPRESSION: 1. Improved interstitial prominence,  suggesting improved pulmonary edema. 2. Worsening dense retrocardiac opacity, which could represent atelectasis, aspiration, and/or pneumonia. 3. Mild cardiomegaly with suspected small bilateral pleural effusions. Pulmonary vascular congestion. Electronically Signed   By: Feliberto Harts MD   On: 07/20/2020 07:49   DG Chest Portable 1 View  Result Date: 07/19/2020 CLINICAL DATA:  Shortness of breath for 1 week EXAM: PORTABLE CHEST 1 VIEW COMPARISON:  10/06/2013 FINDINGS: Cardiac shadow is mildly enlarged. Mild increased vascular congestion is noted with interstitial edema consistent with mild CHF. Small effusions are noted bilaterally left greater than right. No bony abnormality is noted. IMPRESSION: Changes of mild CHF with small effusions. Electronically Signed   By: Alcide Clever M.D.   On: 07/19/2020 08:16     CBC Recent Labs  Lab 07/19/20 0742 07/20/20 0434  WBC 9.3 7.6  HGB 11.5* 10.8*  HCT 38.5* 34.6*  PLT 193 185  MCV 94.8 91.8  MCH 28.3 28.6  MCHC 29.9* 31.2  RDW 16.0* 16.1*  LYMPHSABS 1.9  --   MONOABS 0.8  --   EOSABS 0.4  --   BASOSABS 0.0  --     Chemistries  Recent Labs  Lab 07/19/20 0742 07/20/20 0434  NA 139 137  K 3.8 3.8  CL 111 103  CO2 20* 25  GLUCOSE 145* 100*  BUN 18 24*  CREATININE 1.19 1.33*  CALCIUM 7.6* 8.6*  AST 19 14*  ALT 15 13  ALKPHOS 64 60  BILITOT 0.7 1.1   ------------------------------------------------------------------------------------------------------------------ Recent Labs    07/19/20 0742  TRIG 57    Lab Results  Component Value Date   HGBA1C 5.6 10/04/2015    ------------------------------------------------------------------------------------------------------------------ No results for input(s): TSH, T4TOTAL, T3FREE, THYROIDAB in the last 72 hours.  Invalid input(s): FREET3 ------------------------------------------------------------------------------------------------------------------ Recent Labs    07/19/20 0742  FERRITIN 15*    Coagulation profile No results for input(s): INR, PROTIME in the last 168 hours.  Recent Labs    07/19/20 0742  DDIMER 0.77*    Cardiac Enzymes No results for input(s): CKMB, TROPONINI, MYOGLOBIN in the last 168 hours.  Invalid input(s): CK ------------------------------------------------------------------------------------------------------------------    Component Value Date/Time   BNP 913.0 (H) 07/19/2020 4801     Shon Hale M.D on 07/20/2020 at 10:22 AM  Go to [email or web address] - for contact info  [facility] - Office  [phone]

## 2020-07-20 NOTE — Progress Notes (Signed)
*  PRELIMINARY RESULTS* Echocardiogram 2D Echocardiogram has been performed.  Jeryl Columbia 07/20/2020, 9:44 AM

## 2020-07-21 MED ORDER — SODIUM CHLORIDE 0.9 % IV SOLN
1.0000 g | INTRAVENOUS | Status: DC
  Administered 2020-07-21 – 2020-07-22 (×2): 1 g via INTRAVENOUS
  Filled 2020-07-21 (×2): qty 10

## 2020-07-21 MED ORDER — FUROSEMIDE 10 MG/ML IJ SOLN
40.0000 mg | Freq: Every day | INTRAMUSCULAR | Status: DC
  Administered 2020-07-22: 40 mg via INTRAVENOUS
  Filled 2020-07-21: qty 4

## 2020-07-21 MED ORDER — IPRATROPIUM BROMIDE 0.02 % IN SOLN
0.5000 mg | Freq: Two times a day (BID) | RESPIRATORY_TRACT | Status: DC
  Administered 2020-07-21 – 2020-07-22 (×2): 0.5 mg via RESPIRATORY_TRACT
  Filled 2020-07-21 (×2): qty 2.5

## 2020-07-21 MED ORDER — SODIUM CHLORIDE 0.9 % IV SOLN
500.0000 mg | INTRAVENOUS | Status: DC
  Administered 2020-07-21 – 2020-07-22 (×2): 500 mg via INTRAVENOUS
  Filled 2020-07-21 (×2): qty 500

## 2020-07-21 MED ORDER — SALINE SPRAY 0.65 % NA SOLN
1.0000 | NASAL | Status: DC | PRN
  Filled 2020-07-21: qty 44

## 2020-07-21 MED ORDER — LEVALBUTEROL HCL 0.63 MG/3ML IN NEBU
0.6300 mg | INHALATION_SOLUTION | Freq: Two times a day (BID) | RESPIRATORY_TRACT | Status: DC
  Administered 2020-07-21 – 2020-07-22 (×2): 0.63 mg via RESPIRATORY_TRACT
  Filled 2020-07-21 (×2): qty 3

## 2020-07-21 MED ORDER — GUAIFENESIN ER 600 MG PO TB12
600.0000 mg | ORAL_TABLET | Freq: Two times a day (BID) | ORAL | Status: DC
  Administered 2020-07-21 – 2020-07-22 (×3): 600 mg via ORAL
  Filled 2020-07-21 (×3): qty 1

## 2020-07-21 NOTE — Progress Notes (Signed)
Patient Demographics:    Taylor Cross, is a 74 y.o. male, DOB - Apr 21, 1946, QIH:[id number]  Admit date - 07/19/2020   Admitting Physician Dewayne Shorter Levora Dredge, MD  Outpatient Primary MD for the patient is [name], MD  LOS - 2   Chief Complaint  Patient presents with  . Shortness of Breath    Pt has SOB for the last week progressively getting worse, pt has all 3 COVID vaccines, skin warm and pale, pt alert and oriented.  Pt vomiting during triage        Subjective:    Taylor Cross today has no fevers, no emesis,  No chest pain,   -Respiratory status improving, O2 sats dropped to the mid 80s with ambulation patient has dyspnea with ambulation -No longer requiring BiPAP  Assessment  & Plan :    Principal Problem:   Acute respiratory failure with hypoxia and hypercapnia (HCC) Active Problems:   Emphysema lung (HCC)   Essential hypertension   Gastroesophageal reflux disease with esophagitis   Benign prostatic hyperplasia with nocturia   Acute systolic CHF (congestive heart failure) (HCC)  Brief Summary:- 74 y.o. male with medical history significant of chronic systolic heart failure, BPH, COPD admitted on 07/19/2020 with acute hypoxic respiratory failure secondary to acute on chronic systolic dysfunction CHF exacerbation  A/p 1)Acute on Chronic combined Diastolic and systolic Heart Failure-  -Hypoxia with ambulation and dyspnea on exertion persist -No hypoxia at rest -c/n IV Lasix  -Daily weight and fluid input and output monitoring -Fluid balance is negative this admission -Echo from June 2020 had shown improved EF up to 50 to 55% with diastolic dysfunction -Repeat echo from 07/20/2020 shows that EF is down to 35 to 40% from 50 to 55% back in June 2020, -Patient has some regional wall motion normalities -Patient will benefit from outpatient cardiology follow-up -Increase Coreg to 25 mg  twice daily, avoid ACEI/ARB/ARNI due to renal concerns   2) community acquired pneumonia--- imaging studies and clinical exam suggestive of pneumonia, continue Rocephin and azithromycin, along with bronchodilators  3)Acute hypoxic respiratory failure--due to #1 and #2 above, improving with diuresis with IV Lasix and antibiotics,   4)GERD--stable, continue Protonix  Daily  5)HLD-stable, continue pravastatin 40 mg daily  6)BPH-stable, voiding okay, continue Flomax and Proscar  7) anxiety/depression, stable, continue Zoloft 50 mg daily  8)CKD IIIA--creatinine currently 1.33 which is close to patient's baseline, monitor renal function closely with IV diuresis - renally adjust medications, avoid nephrotoxic agents / dehydration  / hypotension  9) acute on chronic anemia--MCV and MCH WNL -Hemoglobin is down to 10.8 from a baseline usually above 12, suspect hemodilution rather than acute blood loss -Monitor closely  10) COPD--- patient does not have significant exacerbation at this time, treat respiratory symptoms as above #1 #2   Disposition/Need for in-Hospital Stay- patient unable to be discharged at this time due to --acute hypoxic respiratory failure secondary to systolic dysfunction CHF requiring IV diuresis and monitoring of renal function and electrolytes, as well as supplemental oxygen  Status is: Inpatient  Remains inpatient appropriate because:Please see above   Disposition: The patient is from: Home              Anticipated d/c is to: Home  Anticipated d/c date is: 1 day              Patient currently is not medically stable to d/c. Barriers: Not Clinically Stable-   Code Status :  -  Code Status: Full Code   Family Communication:    NA (patient is alert, awake and coherent)   Consults  :  na  DVT Prophylaxis  :   - SCDs   enoxaparin (LOVENOX) injection 40 mg Start: 07/19/20 1200    Lab Results  Component Value Date   PLT 185 07/20/2020     Inpatient Medications  Scheduled Meds: . allopurinol  100 mg Oral Daily  . aspirin  81 mg Oral Daily  . carvedilol  12.5 mg Oral BID WC  . Chlorhexidine Gluconate Cloth  6 each Topical Daily  . enoxaparin (LOVENOX) injection  40 mg Subcutaneous Q24H  . finasteride  5 mg Oral Daily  . fluticasone  2 spray Each Nare Daily  . furosemide  40 mg Intravenous Q12H  . gabapentin  300 mg Oral BID  . guaiFENesin  600 mg Oral BID  . ipratropium  0.5 mg Nebulization BID  . levalbuterol  0.63 mg Nebulization BID  . loratadine  10 mg Oral QHS  . pantoprazole  40 mg Oral Daily  . pravastatin  40 mg Oral Daily  . sertraline  50 mg Oral Daily  . tamsulosin  0.8 mg Oral Daily   Continuous Infusions: . azithromycin 500 mg (07/21/20 0936)  . cefTRIAXone (ROCEPHIN)  IV 1 g (07/21/20 0842)   PRN Meds:.acetaminophen **OR** acetaminophen, albuterol, guaiFENesin-dextromethorphan, ondansetron **OR** ondansetron (ZOFRAN) IV, polyvinyl alcohol, sodium chloride, traMADol    Anti-infectives (From admission, onward)   Start     Dose/Rate Route Frequency Ordered Stop   07/21/20 0830  cefTRIAXone (ROCEPHIN) 1 g in sodium chloride 0.9 % 100 mL IVPB        1 g 200 mL/hr over 30 Minutes Intravenous Every 24 hours 07/21/20 0759     07/21/20 0830  azithromycin (ZITHROMAX) 500 mg in sodium chloride 0.9 % 250 mL IVPB        500 mg 250 mL/hr over 60 Minutes Intravenous Every 24 hours 07/21/20 0759          Objective:   Vitals:   07/21/20 0735 07/21/20 0800 07/21/20 1118 07/21/20 1600  BP:  (!) 161/61  132/65  Pulse: (!) 47 (!) 35 (!) 35 73  Resp: 15 14 13    Temp: 98.2 F (36.8 C)  97.6 F (36.4 C) 97.8 F (36.6 C)  TempSrc: Oral  Oral Oral  SpO2: 98% 97% 97% 97%  Weight:      Height:        Wt Readings from Last 3 Encounters:  07/20/20 80.4 kg  04/21/20 84.4 kg  10/20/19 86.7 kg     Intake/Output Summary (Last 24 hours) at 07/21/2020 1729 Last data filed at 07/21/2020 1600 Gross per 24  hour  Intake 350 ml  Output 500 ml  Net -150 ml     Physical Exam  Gen:- Awake Alert, has dyspnea on exertion  HEENT:- [place].AT, No sclera icterus Neck-Supple Neck, .  Lungs-air movement is improving, no wheezing CV- S1, S2 normal, regular  Abd-  +ve B.Sounds, Abd Soft, No tenderness,    Extremity/Skin:- No significant edema, pedal pulses present  Psych-affect is appropriate, oriented x3 Neuro-generalized weakness, no new focal deficits, no tremors   Data Review:   Micro Results Recent Results (from the  past 240 hour(s))  Resp Panel by RT-PCR (Flu A&B, Covid) Nasopharyngeal Swab     Status: None   Collection Time: 07/19/20  7:44 AM   Specimen: Nasopharyngeal Swab; Nasopharyngeal(NP) swabs in vial transport medium  Result Value Ref Range Status   SARS Coronavirus 2 by RT PCR NEGATIVE NEGATIVE Final    Comment: (NOTE) SARS-CoV-2 target nucleic acids are NOT DETECTED.  The SARS-CoV-2 RNA is generally detectable in upper respiratory specimens during the acute phase of infection. The lowest concentration of SARS-CoV-2 viral copies this assay can detect is 138 copies/mL. A negative result does not preclude SARS-Cov-2 infection and should not be used as the sole basis for treatment or other patient management decisions. A negative result may occur with  improper specimen collection/handling, submission of specimen other than nasopharyngeal swab, presence of viral mutation(s) within the areas targeted by this assay, and inadequate number of viral copies(<138 copies/mL). A negative result must be combined with clinical observations, patient history, and epidemiological information. The expected result is Negative.  Fact Sheet for Patients:  BloggerCourse.com  Fact Sheet for Healthcare Providers:  SeriousBroker.it  This test is no t yet approved or cleared by the [place] FDA and  has been authorized for detection and/or  diagnosis of SARS-CoV-2 by FDA under an Emergency Use Authorization (EUA). This EUA will remain  in effect (meaning this test can be used) for the duration of the COVID-19 declaration under Section 564(b)(1) of the Act, 21 U.S.C.section 360bbb-3(b)(1), unless the authorization is terminated  or revoked sooner.       Influenza A by PCR NEGATIVE NEGATIVE Final   Influenza B by PCR NEGATIVE NEGATIVE Final    Comment: (NOTE) The Xpert Xpress SARS-CoV-2/FLU/RSV plus assay is intended as an aid in the diagnosis of influenza from Nasopharyngeal swab specimens and should not be used as a sole basis for treatment. Nasal washings and aspirates are unacceptable for Xpert Xpress SARS-CoV-2/FLU/RSV testing.  Fact Sheet for Patients: BloggerCourse.com  Fact Sheet for Healthcare Providers: SeriousBroker.it  This test is not yet approved or cleared by the [place] FDA and has been authorized for detection and/or diagnosis of SARS-CoV-2 by FDA under an Emergency Use Authorization (EUA). This EUA will remain in effect (meaning this test can be used) for the duration of the COVID-19 declaration under Section 564(b)(1) of the Act, 21 U.S.C. section 360bbb-3(b)(1), unless the authorization is terminated or revoked.  Performed at [facility], [address], [place], [place] [address]   Blood Culture (routine x 2)     Status: None (Preliminary result)   Collection Time: 07/19/20  8:06 AM   Specimen: BLOOD  Result Value Ref Range Status   Specimen Description BLOOD RIGHT ANTECUBITAL  Final   Special Requests   Final    BOTTLES DRAWN AEROBIC AND ANAEROBIC Blood Culture adequate volume   Culture   Final    NO GROWTH 2 DAYS Performed at [facility], [address], [place], [place] [address]    Report Status PENDING  Incomplete  Blood Culture (routine x 2)     Status: None (Preliminary result)   Collection Time: 07/19/20  8:15 AM   Specimen:  BLOOD  Result Value Ref Range Status   Specimen Description BLOOD RIGHT ANTECUBITAL  Final   Special Requests   Final    BOTTLES DRAWN AEROBIC AND ANAEROBIC Blood Culture adequate volume   Culture   Final    NO GROWTH 2 DAYS Performed at [facility], [address] [place], [place] [address]    Report Status PENDING  Incomplete  MRSA PCR Screening     Status: None   Collection Time: 07/19/20 12:15 PM   Specimen: Nasal Mucosa; Nasopharyngeal  Result Value Ref Range Status   MRSA by PCR NEGATIVE NEGATIVE Final    Comment:        The GeneXpert MRSA Assay (FDA approved for NASAL specimens only), is one component of a comprehensive MRSA colonization surveillance program. It is not intended to diagnose MRSA infection nor to guide or monitor treatment for MRSA infections. Performed at [facility], [address] [place], [place] [address]     Radiology Reports DG Chest [place] 1 View  Result Date: 07/20/2020 CLINICAL DATA:  Shortness of breath. EXAM: PORTABLE CHEST 1 VIEW COMPARISON:  July 19, 2020. FINDINGS: Pulmonary vascular congestion. Mildly enlarged cardiac silhouette. Aortic atherosclerosis. Worsening dense retrocardiac opacity. Improved interstitial prominence. Suspected small bilateral pleural effusions. No visible pneumothorax. IMPRESSION: 1. Improved interstitial prominence, suggesting improved pulmonary edema. 2. Worsening dense retrocardiac opacity, which could represent atelectasis, aspiration, and/or pneumonia. 3. Mild cardiomegaly with suspected small bilateral pleural effusions. Pulmonary vascular congestion. Electronically Signed   By: Feliberto Harts MD   On: 07/20/2020 07:49   DG Chest Portable 1 View  Result Date: 07/19/2020 CLINICAL DATA:  Shortness of breath for 1 week EXAM: PORTABLE CHEST 1 VIEW COMPARISON:  10/06/2013 FINDINGS: Cardiac shadow is mildly enlarged. Mild increased vascular congestion is noted with interstitial edema consistent with mild CHF.  Small effusions are noted bilaterally left greater than right. No bony abnormality is noted. IMPRESSION: Changes of mild CHF with small effusions. Electronically Signed   By: Alcide Clever M.D.   On: 07/19/2020 08:16   ECHOCARDIOGRAM COMPLETE  Result Date: 07/20/2020    ECHOCARDIOGRAM REPORT   Patient Name:   Taylor Cross Date of Exam: 07/20/2020 Medical Rec #:  [id number]     Height:       68.0 in Accession #:    [id number]    Weight:       195.0 lb Date of Birth:  1945/11/23     BSA:          2.022 m Patient Age:    74 years      BP:           153/88 mmHg Patient Gender: M             HR:           49 bpm. [facility] Procedure: 2D Echo Indications:    [place] respiratory distress R06.03  History:        Patient has prior history of Echocardiogram examinations, most                 recent 01/15/2019. CHF; Risk Factors:Non-Smoker, Dyslipidemia and                 Hypertension. PVC's (premature ventricular contractions.  Sonographer:    Jeryl Columbia RDCS (AE) Referring Phys: [id number] [facility] IMPRESSIONS  1. Left ventricular ejection fraction, by estimation, is 35 to 40%. The left ventricle has moderately decreased function. The left ventricle demonstrates regional wall motion abnormalities (see scoring diagram/findings for description). There is mild left ventricular hypertrophy. Left ventricular diastolic parameters are indeterminate.  2. Right ventricular systolic function is normal. The right ventricular size is normal. Tricuspid regurgitation signal is inadequate for assessing PA pressure.  3. Left atrial size was mild to moderately  dilated.  4. The mitral valve is grossly normal. Mild mitral valve regurgitation.  5. The aortic valve is tricuspid. There is moderate calcification of the aortic valve. Aortic valve regurgitation is not visualized. Mild aortic valve stenosis. Aortic valve mean gradient measures 10.0 mmHg.  6. The inferior vena cava is normal in size with greater than 50%  respiratory variability, suggesting right atrial pressure of 3 mmHg.  7. Left pleural effusion is noted. FINDINGS  Left Ventricle: Left ventricular ejection fraction, by estimation, is 35 to 40%. The left ventricle has moderately decreased function. The left ventricle demonstrates regional wall motion abnormalities. The left ventricular internal cavity size was normal in size. There is mild left ventricular hypertrophy. Left ventricular diastolic parameters are indeterminate.  LV Wall Scoring: The posterior wall and basal inferior segment are akinetic. The apical septal segment and apex are hypokinetic. The entire anterior wall, antero-lateral wall, anterior septum, mid and distal inferior wall, apical lateral segment, mid inferoseptal segment, and basal inferoseptal segment are normal. Right Ventricle: The right ventricular size is normal. No increase in right ventricular wall thickness. Right ventricular systolic function is normal. Tricuspid regurgitation signal is inadequate for assessing PA pressure. Left Atrium: Left atrial size was mild to moderately dilated. Right Atrium: Right atrial size was normal in size. Pericardium: There is no evidence of pericardial effusion. Mitral Valve: The mitral valve is grossly normal. Mild mitral valve regurgitation. Tricuspid Valve: The tricuspid valve is grossly normal. Tricuspid valve regurgitation is trivial. Aortic Valve: The aortic valve is tricuspid. There is moderate calcification of the aortic valve. There is moderate aortic valve annular calcification. Aortic valve regurgitation is not visualized. Mild aortic stenosis is present. Aortic valve mean gradient measures 10.0 mmHg. Aortic valve peak gradient measures 18.3 mmHg. Aortic valve area, by VTI measures 1.21 cm. Pulmonic Valve: The pulmonic valve was not well visualized. Pulmonic valve regurgitation is trivial. Aorta: The aortic root is normal in size and structure. Venous: The inferior vena cava is normal in  size with greater than 50% respiratory variability, suggesting right atrial pressure of 3 mmHg. IAS/Shunts: No atrial level shunt detected by color flow Doppler. Additional Comments: There is pleural effusion in the left lateral region.  LEFT VENTRICLE PLAX 2D LVIDd:         5.12 cm  Diastology LVIDs:         4.26 cm  LV e' medial:    6.09 cm/s LV PW:         1.37 cm  LV E/e' medial:  14.0 LV IVS:        1.11 cm  LV e' lateral:   5.33 cm/s LVOT diam:     2.00 cm  LV E/e' lateral: 16.0 LV SV:         57 LV SV Index:   28 LVOT Area:     3.14 cm  RIGHT VENTRICLE RV S prime:     16.90 cm/s TAPSE (M-mode): 2.6 cm LEFT ATRIUM             Index       RIGHT ATRIUM           Index LA diam:        4.90 cm 2.42 cm/m  RA Area:     16.10 cm LA Vol (A2C):   90.1 ml 44.56 ml/m RA Volume:   44.60 ml  22.06 ml/m LA Vol (A4C):   74.5 ml 36.85 ml/m LA Biplane Vol: 85.4 ml 42.24 ml/m  AORTIC VALVE  AV Area (Vmax):    1.17 cm AV Area (Vmean):   1.20 cm AV Area (VTI):     1.21 cm AV Vmax:           214.00 cm/s AV Vmean:          141.667 cm/s AV VTI:            0.475 m AV Peak Grad:      18.3 mmHg AV Mean Grad:      10.0 mmHg LVOT Vmax:         80.00 cm/s LVOT Vmean:        53.900 cm/s LVOT VTI:          0.183 m LVOT/AV VTI ratio: 0.38  AORTA Ao Root diam: 2.70 cm MITRAL VALVE MV Area (PHT): 3.26 cm    SHUNTS MV Decel Time: 233 msec    Systemic VTI:  0.18 m MR Peak grad: 61.8 mmHg    Systemic Diam: 2.00 cm MR Vmax:      393.00 cm/s MV E velocity: 85.10 cm/s MV A velocity: 80.80 cm/s MV E/A ratio:  1.05 Nona Dell MD Electronically signed by Nona Dell MD Signature Date/Time: 07/20/2020/11:49:42 AM    Final      CBC Recent Labs  Lab 07/19/20 0742 07/20/20 0434  WBC 9.3 7.6  HGB 11.5* 10.8*  HCT 38.5* 34.6*  PLT 193 185  MCV 94.8 91.8  MCH 28.3 28.6  MCHC 29.9* 31.2  RDW 16.0* 16.1*  LYMPHSABS 1.9  --   MONOABS 0.8  --   EOSABS 0.4  --   BASOSABS 0.0  --     Chemistries  Recent Labs  Lab  07/19/20 0742 07/20/20 0434  NA 139 137  K 3.8 3.8  CL 111 103  CO2 20* 25  GLUCOSE 145* 100*  BUN 18 24*  CREATININE 1.19 1.33*  CALCIUM 7.6* 8.6*  AST 19 14*  ALT 15 13  ALKPHOS 64 60  BILITOT 0.7 1.1   ------------------------------------------------------------------------------------------------------------------ Recent Labs    07/19/20 0742  TRIG 57    Lab Results  Component Value Date   HGBA1C 5.6 10/04/2015   ------------------------------------------------------------------------------------------------------------------ No results for input(s): TSH, T4TOTAL, T3FREE, THYROIDAB in the last 72 hours.  Invalid input(s): FREET3 ------------------------------------------------------------------------------------------------------------------ Recent Labs    07/19/20 0742  FERRITIN 15*    Coagulation profile No results for input(s): INR, PROTIME in the last 168 hours.  Recent Labs    07/19/20 0742  DDIMER 0.77*    Cardiac Enzymes No results for input(s): CKMB, TROPONINI, MYOGLOBIN in the last 168 hours.  Invalid input(s): CK ------------------------------------------------------------------------------------------------------------------    Component Value Date/Time   BNP 913.0 (H) 07/19/2020 1610     Shon Hale M.D on 07/21/2020 at 5:29 PM  Go to [email or web address] - for contact info  [facility] - Office  [phone]

## 2020-07-21 NOTE — Evaluation (Signed)
Physical Therapy Evaluation Patient Details Name: Taylor Cross MRN: [id number] DOB: Dec 04, 1945 Today's Date: 07/21/2020   History of Present Illness  Taylor Cross is a 74 y.o. male with medical history significant of chronic systolic heart failure, BPH, COPD presenting to the ED for 7-day history of worsening shortness of breath.  Claims that approximately 7 days back-he had a "head cold".  He then started gradually developing shortness of breath-it was mostly exertional at first-he also noted that he was having trouble lying flat-and needed around 3-4 pillows to sleep at night.  Does acknowledge a productive cough.  He denies any fever.  His shortness of breath worsened overnight-EMS was called-his O2 saturation was in the 70s on room air-he was put on 6 L of oxygen via [place]-and transported to the emergency room.  While in the emergency room-further imaging studies were performed-he was thought to have decompensated heart failure-placed on BiPAP-the hospitalist service was asked to admit this patient for further evaluation and treatment.    Clinical Impression  Patient functioning near baseline for functional mobility and gait, other than SpO2 dropping from 98% to 86% during ambulation while on room air.  Patient able to ambulate safely in room and hallway and encouraged to ambulate ad lib while in hospital.  Plan:  Patient discharged from physical therapy to care of nursing for ambulation daily as tolerated for length of stay.     Follow Up Recommendations No PT follow up    Equipment Recommendations  None recommended by PT    Recommendations for Other Services       Precautions / Restrictions Precautions Precautions: None Restrictions Weight Bearing Restrictions: No      Mobility  Bed Mobility Overal bed mobility: Modified Independent             General bed mobility comments: requires head of bed raised due SOB when lying flat    Transfers Overall transfer level:  Modified independent                  Ambulation/Gait Ambulation/Gait assistance: Modified independent (Device/Increase time) Gait Distance (Feet): 200 Feet Assistive device: None;IV Pole Gait Pattern/deviations: WFL(Within Functional Limits) Gait velocity: slightly decreased   General Gait Details: grossly WLF, demonstrates good return for ambulation in room and hallways without loss of balance without use of AD and when pushing IV pole  Stairs            Wheelchair Mobility    Modified Rankin (Stroke Patients Only)       Balance Overall balance assessment: No apparent balance deficits (not formally assessed)                                           Pertinent Vitals/Pain Pain Assessment: No/denies pain    Home Living Family/patient expects to be discharged to:: Private residence Living Arrangements: Alone Available Help at Discharge: Neighbor;Available PRN/intermittently Type of Home: House Home Access: Stairs to enter   Entrance Stairs-Number of Steps: 1 Home Layout: Laundry or work area in basement;Able to live on main level with bedroom/bathroom;Full bath on main level;Two level Home Equipment: Cane - single point;Bedside commode Additional Comments: has old BSC with parts missing    Prior Function Level of Independence: Independent         Comments: Tourist information centre manager, drives     Higher education careers adviser  Extremity/Trunk Assessment   Upper Extremity Assessment Upper Extremity Assessment: Overall WFL for tasks assessed    Lower Extremity Assessment Lower Extremity Assessment: Overall WFL for tasks assessed    Cervical / Trunk Assessment Cervical / Trunk Assessment: Normal  Communication   Communication: No difficulties  Cognition Arousal/Alertness: Awake/alert Behavior During Therapy: WFL for tasks assessed/performed Overall Cognitive Status: Within Functional Limits for tasks assessed                                         General Comments      Exercises     Assessment/Plan    PT Assessment Patent does not need any further PT services  PT Problem List         PT Treatment Interventions      PT Goals (Current goals can be found in the Care Plan section)  Acute Rehab PT Goals Patient Stated Goal: return home with [place] to assist PT Goal Formulation: With patient Time For Goal Achievement: 07/21/20 Potential to Achieve Goals: Good    Frequency     Barriers to discharge        Co-evaluation               AM-PAC PT "6 Clicks" Mobility  Outcome Measure Help needed turning from your back to your side while in a flat bed without using bedrails?: None Help needed moving from lying on your back to sitting on the side of a flat bed without using bedrails?: None Help needed moving to and from a bed to a chair (including a wheelchair)?: None Help needed standing up from a chair using your arms (e.g., wheelchair or bedside chair)?: None Help needed to walk in hospital room?: None Help needed climbing 3-5 steps with a railing? : None 6 Click Score: 24    End of Session   Activity Tolerance: Patient tolerated treatment well Patient left: in chair;with call bell/phone within reach Nurse Communication: Mobility status PT Visit Diagnosis: Unsteadiness on feet (R26.81);Other abnormalities of gait and mobility (R26.89);Muscle weakness (generalized) (M62.81)    Time: 8563-1497 PT Time Calculation (min) (ACUTE ONLY): 20 min   Charges:   PT Evaluation $PT Eval Moderate Complexity: 1 Mod PT Treatments $Therapeutic Activity: 8-22 mins        11:03 AM, 07/21/20 Ocie Bob, MPT Physical Therapist with [facility] [phone] office [phone] mobile phone

## 2020-07-21 NOTE — Care Management Important Message (Signed)
Important Message  Patient Details  Name: Taylor Cross MRN: [id number] Date of Birth: Dec 25, 1945   Medicare Important Message Given:  Yes     Ewing Schlein, LCSW 07/21/2020, 10:41 AM

## 2020-07-22 LAB — BASIC METABOLIC PANEL
Anion gap: 12 (ref 5–15)
BUN: 32 mg/dL — ABNORMAL HIGH (ref 8–23)
CO2: 24 mmol/L (ref 22–32)
Calcium: 8.7 mg/dL — ABNORMAL LOW (ref 8.9–10.3)
Chloride: 102 mmol/L (ref 98–111)
Creatinine, Ser: 1.34 mg/dL — ABNORMAL HIGH (ref 0.61–1.24)
GFR, Estimated: 56 mL/min — ABNORMAL LOW (ref >60)
Glucose, Bld: 93 mg/dL (ref 70–99)
Potassium: 3.2 mmol/L — ABNORMAL LOW (ref 3.5–5.1)
Sodium: 138 mmol/L (ref 135–145)

## 2020-07-22 LAB — CBC
HCT: 36.5 % — ABNORMAL LOW (ref 39.0–52.0)
Hemoglobin: 11.5 g/dL — ABNORMAL LOW (ref 13.0–17.0)
MCH: 28.5 pg (ref 26.0–34.0)
MCHC: 31.5 g/dL (ref 30.0–36.0)
MCV: 90.6 fL (ref 80.0–100.0)
Platelets: 190 10*3/uL (ref 150–400)
RBC: 4.03 MIL/uL — ABNORMAL LOW (ref 4.22–5.81)
RDW: 15.9 % — ABNORMAL HIGH (ref 11.5–15.5)
WBC: 7 10*3/uL (ref 4.0–10.5)
nRBC: 0 % (ref 0.0–0.2)

## 2020-07-22 MED ORDER — POTASSIUM CHLORIDE ER 10 MEQ PO TBCR: 10.0000 meq | EXTENDED_RELEASE_TABLET | Freq: Every day | ORAL | 2 refills | Status: DC

## 2020-07-22 MED ORDER — POTASSIUM CHLORIDE CRYS ER 20 MEQ PO TBCR
40.0000 meq | EXTENDED_RELEASE_TABLET | ORAL | Status: DC
  Administered 2020-07-22: 40 meq via ORAL
  Filled 2020-07-22: qty 2

## 2020-07-22 MED ORDER — GUAIFENESIN-DM 100-10 MG/5ML PO SYRP: 5.0000 mL | ORAL_SOLUTION | ORAL | 0 refills | Status: DC | PRN

## 2020-07-22 MED ORDER — ASPIRIN 81 MG PO CHEW: 81.0000 mg | CHEWABLE_TABLET | Freq: Every day | ORAL | 3 refills | Status: DC

## 2020-07-22 MED ORDER — CARVEDILOL 25 MG PO TABS: 25.0000 mg | ORAL_TABLET | Freq: Two times a day (BID) | ORAL | 5 refills | Status: DC

## 2020-07-22 MED ORDER — AZITHROMYCIN 500 MG PO TABS: 500.0000 mg | ORAL_TABLET | Freq: Every day | ORAL | 0 refills | Status: AC

## 2020-07-22 MED ORDER — CEFDINIR 300 MG PO CAPS: 300.0000 mg | ORAL_CAPSULE | Freq: Two times a day (BID) | ORAL | 0 refills | Status: AC

## 2020-07-22 MED ORDER — GUAIFENESIN ER 600 MG PO TB12: 600.0000 mg | ORAL_TABLET | Freq: Two times a day (BID) | ORAL | 0 refills | Status: DC

## 2020-07-22 MED ORDER — ACETAMINOPHEN 325 MG PO TABS: 650.0000 mg | ORAL_TABLET | Freq: Four times a day (QID) | ORAL | 0 refills | Status: AC | PRN

## 2020-07-22 MED ORDER — FUROSEMIDE 40 MG PO TABS: 40.0000 mg | ORAL_TABLET | Freq: Every day | ORAL | 1 refills | Status: DC

## 2020-07-22 MED ORDER — ALBUTEROL SULFATE HFA 108 (90 BASE) MCG/ACT IN AERS: 2.0000 | INHALATION_SPRAY | RESPIRATORY_TRACT | 2 refills | Status: DC | PRN

## 2020-07-22 NOTE — Progress Notes (Signed)
Pt ambulated with nurse tech and did well, on room air his 02 sats were 96%.

## 2020-07-22 NOTE — Progress Notes (Signed)
Pt has discharge orders. Discharge teaching was given to patient and new medication schedule was explained, pt has no further questions. Awaiting arrival of pt's sister in law to transport pt home.

## 2020-07-22 NOTE — Discharge Summary (Signed)
Taylor Cross, is a 74 y.o. male  DOB Oct 02, 1945  MRN [id number].  Admission date:  07/19/2020  Admitting Physician  Maretta Bees, MD  Discharge Date:  07/22/2020   Primary MD  [name], MD  Recommendations for primary care physician for things to follow:   1)Very low-salt diet advised 2)Weigh yourself daily, call if you gain more than 3 pounds in 1 day or more than 5 pounds in 1 week as your diuretic medications may need to be adjusted 3)Limit your Fluid  intake to no more than 60 ounces (1.8 Liters) per day 4) follow-up with a cardiologist Dr. Wyline Mood within a week for recheck and reevaluation 5) please note that your Coreg/carvedilol has been increased to 25 mg twice daily 6)Repeat CBC and BMP blood tests advised in about 1 week   Admission Diagnosis  Acute respiratory failure with hypoxia (HCC) [J96.01] Acute respiratory failure with hypoxemia (HCC) [J96.01] Acute congestive heart failure, unspecified heart failure type (HCC) [I50.9]   Discharge Diagnosis  Acute respiratory failure with hypoxia (HCC) [J96.01] Acute respiratory failure with hypoxemia (HCC) [J96.01] Acute congestive heart failure, unspecified heart failure type (HCC) [I50.9]    Principal Problem:   Acute respiratory failure with hypoxia and hypercapnia (HCC) Active Problems:   Emphysema lung (HCC)   Essential hypertension   Gastroesophageal reflux disease with esophagitis   Benign prostatic hyperplasia with nocturia   Acute systolic CHF (congestive heart failure) (HCC)      Past Medical History:  Diagnosis Date  . Combined systolic and diastolic heart failure (HCC)   . Hyperlipidemia     Past Surgical History:  Procedure Laterality Date  . HERNIA REPAIR    . TONSILLECTOMY       HPI  from the history and physical done on the day of admission:    CHIEF COMPLAINT:  Gradually worsening shortness of  breath-x7 days  HPI: Taylor Cross is a 74 y.o. male with medical history significant of chronic systolic heart failure, BPH, COPD presenting to the ED for 7-day history of worsening shortness of breath.  Claims that approximately 7 days back-he had a "head cold".  He then started gradually developing shortness of breath-it was mostly exertional at first-he also noted that he was having trouble lying flat-and needed around 3-4 pillows to sleep at night.  Does acknowledge a productive cough.  He denies any fever.  His shortness of breath worsened overnight-EMS was called-his O2 saturation was in the 70s on room air-he was put on 6 L of oxygen via [place]-and transported to the emergency room.  While in the emergency room-further imaging studies were performed-he was thought to have decompensated heart failure-placed on BiPAP-the hospitalist service was asked to admit this patient for further evaluation and treatment.  Patient denies any headache, fever, chest pain, nausea, vomiting, diarrhea, abdominal pain.  ED Course: Elevated BNP-chest x-ray consistent with CHF-placed on BiPAP-given Lasix 40 mg x 1.  Note: Lives at: Home Mobility:Independent Chronic Indwelling Foley:no   Hospital Course:  Brief Summary:- 74 y.o.malewith medical history significant ofchronic systolic heart failure, BPH, COPD admitted on 07/19/2020 with acute hypoxic respiratory failure secondary to acute on chronic systolic dysfunction CHF exacerbation  A/p 1)Acute on Chronic combined Diastolic and systolic Heart Failure-  -Overall much improved, ambulated without hypoxia or significant dyspnea on exertion today O2 sats at or above 96% on room air even with ambulation -Treated with IV Lasix, will discharge on p.o. Lasix -Echo from June 2020 had shown improved EF up to 50 to 55% with diastolic dysfunction -Repeat echo from 07/20/2020 shows that EF is down to 35 to 40% from 50 to 55% back in June 2020, -Patient has  some regional wall motion normalities -Patient will benefit from outpatient cardiology follow-up -Increase Coreg to 25 mg twice daily, avoid ACEI/ARB/ARNI due to renal concerns  2) community acquired pneumonia--- imaging studies and clinical exam suggestive of pneumonia,  -Much improved with IV  Rocephin and azithromycin, along with bronchodilators -Hypoxia has resolved even with ambulation, -Discharge home on Omnicef and azithromycin  3)Acute hypoxic respiratory failure--due to #1 and #2 above, improving with diuresis with IV Lasix and antibiotics,  -Resolved with treatment of #1 and #2 above  4)GERD--stable, continue Protonix  Daily  5)HLD-stable, continue pravastatin 40 mg daily  6)BPH-stable, voiding okay, continue Flomax and Proscar  7) anxiety/depression, stable, continue Zoloft 50 mg daily  8)CKD IIIA--creatinine currently 1.34 which is close to patient's baseline,  - renally adjust medications, avoid nephrotoxic agents / dehydration  / hypotension  9) acute on chronic anemia--MCV and MCH WNL -Hemoglobin is 11.5, from a baseline usually above 12, suspect hemodilution rather than acute blood loss  10) COPD--- patient does not have significant exacerbation at this time, treat respiratory symptoms as above #1 #2   Disposition--home   Disposition: The patient is from: Home  Anticipated d/c is to: Home  Code Status :  -  Code Status: Full Code   Family Communication:    NA (patient is alert, awake and coherent)   Consults  :  na  Discharge Condition: stable, without hypoxia  Follow UP   Follow-up Information    Antoine Poche, MD. Schedule an appointment as soon as possible for a visit in 1 week(s).   Specialty: Cardiology Why: Ejection Fraction is lower--- Contact information: [address] [place] [address] [phone]               Diet and Activity recommendation:  As advised  Discharge Instructions     Discharge Instructions    Call MD for:  difficulty breathing, headache or visual disturbances   Complete by: As directed    Call MD for:  persistant dizziness or light-headedness   Complete by: As directed    Call MD for:  persistant nausea and vomiting   Complete by: As directed    Call MD for:  temperature >100.4   Complete by: As directed    Diet - low sodium heart healthy   Complete by: As directed    Discharge instructions   Complete by: As directed    1)Very low-salt diet advised 2)Weigh yourself daily, call if you gain more than 3 pounds in 1 day or more than 5 pounds in 1 week as your diuretic medications may need to be adjusted 3)Limit your Fluid  intake to no more than 60 ounces (1.8 Liters) per day 4) follow-up with a cardiologist Dr. Wyline Mood within a week for recheck and reevaluation 5) please note that your Coreg/carvedilol  has been increased to 25 mg twice daily 6)Repeat CBC and BMP blood tests advised in about 1 week   Increase activity slowly   Complete by: As directed         Discharge Medications     Allergies as of 07/22/2020      Reactions   Neosporin [neomycin-bacitracin Zn-polymyx] Rash   Voltaren [diclofenac Sodium] Other (See Comments)   GI upset      Medication List    TAKE these medications   acetaminophen 325 MG tablet Commonly known as: TYLENOL Take 2 tablets (650 mg total) by mouth every 6 (six) hours as needed for mild pain, fever or headache (or Fever >/= 101).   albuterol 108 (90 Base) MCG/ACT inhaler Commonly known as: VENTOLIN HFA Inhale 2 puffs into the lungs every 4 (four) hours as needed for wheezing or shortness of breath.   allopurinol 100 MG tablet Commonly known as: ZYLOPRIM TAKE 1 TABLET DAILY   aspirin 81 MG chewable tablet Chew 1 tablet (81 mg total) by mouth daily with breakfast. What changed: when to take this   azithromycin 500 MG tablet Commonly known as: ZITHROMAX Take 1 tablet (500 mg total) by mouth daily  for 5 days.   carvedilol 25 MG tablet Commonly known as: COREG Take 1 tablet (25 mg total) by mouth 2 (two) times daily with a meal. What changed:   how much to take  how to take this  when to take this  additional instructions   cefdinir 300 MG capsule Commonly known as: OMNICEF Take 1 capsule (300 mg total) by mouth 2 (two) times daily for 5 days.   finasteride 5 MG tablet Commonly known as: PROSCAR Take 1 tablet (5 mg total) by mouth daily.   fluticasone 50 MCG/ACT nasal spray Commonly known as: FLONASE USE 2 SPRAYS IN EACH NOSTRIL ONCE DAILY.   furosemide 40 MG tablet Commonly known as: Lasix Take 1 tablet (40 mg total) by mouth daily.   gabapentin 300 MG capsule Commonly known as: NEURONTIN TAKE  (1)  CAPSULE  TWICE DAILY. What changed:   how much to take  how to take this  when to take this  additional instructions   guaiFENesin 600 MG 12 hr tablet Commonly known as: MUCINEX Take 1 tablet (600 mg total) by mouth 2 (two) times daily.   guaiFENesin-dextromethorphan 100-10 MG/5ML syrup Commonly known as: ROBITUSSIN DM Take 5 mLs by mouth every 4 (four) hours as needed for cough.   hydroquinone 4 % cream APPLY TO AFFECTED AREAS TWICE A DAY What changed: See the new instructions.   loratadine 10 MG tablet Commonly known as: CLARITIN TAKE ONE TABLET AT BEDTIME What changed: when to take this   mupirocin ointment 2 % Commonly known as: BACTROBAN Apply 2 (two) times daily. To affected areas What changed:   how much to take  how to take this  when to take this  additional instructions   pantoprazole 40 MG tablet Commonly known as: PROTONIX TAKE 1 TABLET ONCE DAILY FOR REFLUX What changed:   how much to take  how to take this  when to take this  additional instructions   potassium chloride 10 MEQ tablet Commonly known as: KLOR-CON Take 1 tablet (10 mEq total) by mouth daily. Take While taking Lasix/furosemide   pravastatin 40 MG  tablet Commonly known as: PRAVACHOL Take 1 tablet (40 mg total) by mouth daily.   sertraline 50 MG tablet Commonly known as: ZOLOFT Take 1 tablet (  50 mg total) by mouth daily.   tamsulosin 0.4 MG Caps capsule Commonly known as: FLOMAX TAKE (2) CAPSULES DAILY. What changed:   how much to take  how to take this  when to take this  additional instructions   traMADol 50 MG tablet Commonly known as: ULTRAM Take 1 tablet (50 mg total) by mouth 2 (two) times daily. For back pain       Major procedures and Radiology Reports - PLEASE review detailed and final reports for all details, in brief -   DG Chest Port 1 View  Result Date: 07/20/2020 CLINICAL DATA:  Shortness of breath. EXAM: PORTABLE CHEST 1 VIEW COMPARISON:  July 19, 2020. FINDINGS: Pulmonary vascular congestion. Mildly enlarged cardiac silhouette. Aortic atherosclerosis. Worsening dense retrocardiac opacity. Improved interstitial prominence. Suspected small bilateral pleural effusions. No visible pneumothorax. IMPRESSION: 1. Improved interstitial prominence, suggesting improved pulmonary edema. 2. Worsening dense retrocardiac opacity, which could represent atelectasis, aspiration, and/or pneumonia. 3. Mild cardiomegaly with suspected small bilateral pleural effusions. Pulmonary vascular congestion. Electronically Signed   By: Feliberto Harts MD   On: 07/20/2020 07:49   DG Chest Portable 1 View  Result Date: 07/19/2020 CLINICAL DATA:  Shortness of breath for 1 week EXAM: PORTABLE CHEST 1 VIEW COMPARISON:  10/06/2013 FINDINGS: Cardiac shadow is mildly enlarged. Mild increased vascular congestion is noted with interstitial edema consistent with mild CHF. Small effusions are noted bilaterally left greater than right. No bony abnormality is noted. IMPRESSION: Changes of mild CHF with small effusions. Electronically Signed   By: Alcide Clever M.D.   On: 07/19/2020 08:16   ECHOCARDIOGRAM COMPLETE  Result Date: 07/20/2020     ECHOCARDIOGRAM REPORT   Patient Name:   DEMONTRE PADIN Date of Exam: 07/20/2020 Medical Rec #:  [id number]     Height:       68.0 in Accession #:    [id number]    Weight:       195.0 lb Date of Birth:  11-Nov-1945     BSA:          2.022 m Patient Age:    74 years      BP:           153/88 mmHg Patient Gender: M             HR:           49 bpm. [facility] Procedure: 2D Echo Indications:    [place] respiratory distress R06.03  History:        Patient has prior history of Echocardiogram examinations, most                 recent 01/15/2019. CHF; Risk Factors:Non-Smoker, Dyslipidemia and                 Hypertension. PVC's (premature ventricular contractions.  Sonographer:    Jeryl Columbia RDCS (AE) Referring Phys: [id number] [facility] IMPRESSIONS  1. Left ventricular ejection fraction, by estimation, is 35 to 40%. The left ventricle has moderately decreased function. The left ventricle demonstrates regional wall motion abnormalities (see scoring diagram/findings for description). There is mild left ventricular hypertrophy. Left ventricular diastolic parameters are indeterminate.  2. Right ventricular systolic function is normal. The right ventricular size is normal. Tricuspid regurgitation signal is inadequate for assessing PA pressure.  3. Left atrial size was mild to moderately dilated.  4. The mitral valve is grossly normal. Mild mitral valve regurgitation.  5. The aortic valve is tricuspid. There  is moderate calcification of the aortic valve. Aortic valve regurgitation is not visualized. Mild aortic valve stenosis. Aortic valve mean gradient measures 10.0 mmHg.  6. The inferior vena cava is normal in size with greater than 50% respiratory variability, suggesting right atrial pressure of 3 mmHg.  7. Left pleural effusion is noted. FINDINGS  Left Ventricle: Left ventricular ejection fraction, by estimation, is 35 to 40%. The left ventricle has moderately decreased function. The left ventricle  demonstrates regional wall motion abnormalities. The left ventricular internal cavity size was normal in size. There is mild left ventricular hypertrophy. Left ventricular diastolic parameters are indeterminate.  LV Wall Scoring: The posterior wall and basal inferior segment are akinetic. The apical septal segment and apex are hypokinetic. The entire anterior wall, antero-lateral wall, anterior septum, mid and distal inferior wall, apical lateral segment, mid inferoseptal segment, and basal inferoseptal segment are normal. Right Ventricle: The right ventricular size is normal. No increase in right ventricular wall thickness. Right ventricular systolic function is normal. Tricuspid regurgitation signal is inadequate for assessing PA pressure. Left Atrium: Left atrial size was mild to moderately dilated. Right Atrium: Right atrial size was normal in size. Pericardium: There is no evidence of pericardial effusion. Mitral Valve: The mitral valve is grossly normal. Mild mitral valve regurgitation. Tricuspid Valve: The tricuspid valve is grossly normal. Tricuspid valve regurgitation is trivial. Aortic Valve: The aortic valve is tricuspid. There is moderate calcification of the aortic valve. There is moderate aortic valve annular calcification. Aortic valve regurgitation is not visualized. Mild aortic stenosis is present. Aortic valve mean gradient measures 10.0 mmHg. Aortic valve peak gradient measures 18.3 mmHg. Aortic valve area, by VTI measures 1.21 cm. Pulmonic Valve: The pulmonic valve was not well visualized. Pulmonic valve regurgitation is trivial. Aorta: The aortic root is normal in size and structure. Venous: The inferior vena cava is normal in size with greater than 50% respiratory variability, suggesting right atrial pressure of 3 mmHg. IAS/Shunts: No atrial level shunt detected by color flow Doppler. Additional Comments: There is pleural effusion in the left lateral region.  LEFT VENTRICLE PLAX 2D LVIDd:          5.12 cm  Diastology LVIDs:         4.26 cm  LV e' medial:    6.09 cm/s LV PW:         1.37 cm  LV E/e' medial:  14.0 LV IVS:        1.11 cm  LV e' lateral:   5.33 cm/s LVOT diam:     2.00 cm  LV E/e' lateral: 16.0 LV SV:         57 LV SV Index:   28 LVOT Area:     3.14 cm  RIGHT VENTRICLE RV S prime:     16.90 cm/s TAPSE (M-mode): 2.6 cm LEFT ATRIUM             Index       RIGHT ATRIUM           Index LA diam:        4.90 cm 2.42 cm/m  RA Area:     16.10 cm LA Vol (A2C):   90.1 ml 44.56 ml/m RA Volume:   44.60 ml  22.06 ml/m LA Vol (A4C):   74.5 ml 36.85 ml/m LA Biplane Vol: 85.4 ml 42.24 ml/m  AORTIC VALVE AV Area (Vmax):    1.17 cm AV Area (Vmean):   1.20 cm AV Area (VTI):  1.21 cm AV Vmax:           214.00 cm/s AV Vmean:          141.667 cm/s AV VTI:            0.475 m AV Peak Grad:      18.3 mmHg AV Mean Grad:      10.0 mmHg LVOT Vmax:         80.00 cm/s LVOT Vmean:        53.900 cm/s LVOT VTI:          0.183 m LVOT/AV VTI ratio: 0.38  AORTA Ao Root diam: 2.70 cm MITRAL VALVE MV Area (PHT): 3.26 cm    SHUNTS MV Decel Time: 233 msec    Systemic VTI:  0.18 m MR Peak grad: 61.8 mmHg    Systemic Diam: 2.00 cm MR Vmax:      393.00 cm/s MV E velocity: 85.10 cm/s MV A velocity: 80.80 cm/s MV E/A ratio:  1.05 Nona Dell MD Electronically signed by Nona Dell MD Signature Date/Time: 07/20/2020/11:49:42 AM    Final    Micro Results   Recent Results (from the past 240 hour(s))  Resp Panel by RT-PCR (Flu A&B, Covid) Nasopharyngeal Swab     Status: None   Collection Time: 07/19/20  7:44 AM   Specimen: Nasopharyngeal Swab; Nasopharyngeal(NP) swabs in vial transport medium  Result Value Ref Range Status   SARS Coronavirus 2 by RT PCR NEGATIVE NEGATIVE Final    Comment: (NOTE) SARS-CoV-2 target nucleic acids are NOT DETECTED.  The SARS-CoV-2 RNA is generally detectable in upper respiratory specimens during the acute phase of infection. The lowest concentration of SARS-CoV-2 viral copies  this assay can detect is 138 copies/mL. A negative result does not preclude SARS-Cov-2 infection and should not be used as the sole basis for treatment or other patient management decisions. A negative result may occur with  improper specimen collection/handling, submission of specimen other than nasopharyngeal swab, presence of viral mutation(s) within the areas targeted by this assay, and inadequate number of viral copies(<138 copies/mL). A negative result must be combined with clinical observations, patient history, and epidemiological information. The expected result is Negative.  Fact Sheet for Patients:  BloggerCourse.com  Fact Sheet for Healthcare Providers:  SeriousBroker.it  This test is no t yet approved or cleared by the [place] FDA and  has been authorized for detection and/or diagnosis of SARS-CoV-2 by FDA under an Emergency Use Authorization (EUA). This EUA will remain  in effect (meaning this test can be used) for the duration of the COVID-19 declaration under Section 564(b)(1) of the Act, 21 U.S.C.section 360bbb-3(b)(1), unless the authorization is terminated  or revoked sooner.       Influenza A by PCR NEGATIVE NEGATIVE Final   Influenza B by PCR NEGATIVE NEGATIVE Final    Comment: (NOTE) The Xpert Xpress SARS-CoV-2/FLU/RSV plus assay is intended as an aid in the diagnosis of influenza from Nasopharyngeal swab specimens and should not be used as a sole basis for treatment. Nasal washings and aspirates are unacceptable for Xpert Xpress SARS-CoV-2/FLU/RSV testing.  Fact Sheet for Patients: BloggerCourse.com  Fact Sheet for Healthcare Providers: SeriousBroker.it  This test is not yet approved or cleared by the [place] FDA and has been authorized for detection and/or diagnosis of SARS-CoV-2 by FDA under an Emergency Use Authorization (EUA). This EUA  will remain in effect (meaning this test can be used) for the duration of the COVID-19 declaration under Section 564(b)(1)  of the Act, 21 U.S.C. section 360bbb-3(b)(1), unless the authorization is terminated or revoked.  Performed at [facility], [address] [place], [place] [address]   Blood Culture (routine x 2)     Status: None (Preliminary result)   Collection Time: 07/19/20  8:06 AM   Specimen: BLOOD  Result Value Ref Range Status   Specimen Description BLOOD RIGHT ANTECUBITAL  Final   Special Requests   Final    BOTTLES DRAWN AEROBIC AND ANAEROBIC Blood Culture adequate volume   Culture   Final    NO GROWTH 3 DAYS Performed at [facility], [address], [place], [place] [address]    Report Status PENDING  Incomplete  Blood Culture (routine x 2)     Status: None (Preliminary result)   Collection Time: 07/19/20  8:15 AM   Specimen: BLOOD  Result Value Ref Range Status   Specimen Description BLOOD RIGHT ANTECUBITAL  Final   Special Requests   Final    BOTTLES DRAWN AEROBIC AND ANAEROBIC Blood Culture adequate volume   Culture   Final    NO GROWTH 3 DAYS Performed at [facility], [address], [place], [place] [address]    Report Status PENDING  Incomplete  MRSA PCR Screening     Status: None   Collection Time: 07/19/20 12:15 PM   Specimen: Nasal Mucosa; Nasopharyngeal  Result Value Ref Range Status   MRSA by PCR NEGATIVE NEGATIVE Final    Comment:        The GeneXpert MRSA Assay (FDA approved for NASAL specimens only), is one component of a comprehensive MRSA colonization surveillance program. It is not intended to diagnose MRSA infection nor to guide or monitor treatment for MRSA infections. Performed at [facility], [address] [place], [place] [address]        Today   Subjective    Wardell Hasbargen today has no new complaints No fever  Or chills  -No significant dyspnea on exertion, and no hypoxia with ambulation          Patient has been  seen and examined prior to discharge   Objective   Blood pressure 136/72, pulse 72, temperature 98.6 F (37 C), temperature source Oral, resp. rate 19, height 5\' 8"  (1.727 m), weight 79 kg, SpO2 93 %.   Intake/Output Summary (Last 24 hours) at 07/22/2020 1806 Last data filed at 07/22/2020 1000 Gross per 24 hour  Intake 240 ml  Output 800 ml  Net -560 ml    Exam Gen:- Awake Alert, no acute distress  HEENT:- [place].AT, No sclera icterus Neck-Supple Neck,No JVD,.  Lungs-improved air movement, no wheezing or rhonchi CV- S1, S2 normal, regular Abd-  +ve B.Sounds, Abd Soft, No tenderness,    Extremity/Skin:- No  edema,   good pulses Psych-affect is appropriate, oriented x3 Neuro-no new focal deficits, no tremors    Data Review   CBC w Diff:  Lab Results  Component Value Date   WBC 7.0 07/22/2020   HGB 11.5 (L) 07/22/2020   HGB 12.4 (L) 04/21/2020   HCT 36.5 (L) 07/22/2020   HCT 37.0 (L) 04/21/2020   PLT 190 07/22/2020   PLT 154 04/21/2020   LYMPHOPCT 21 07/19/2020   MONOPCT 9 07/19/2020   EOSPCT 4 07/19/2020   BASOPCT 0 07/19/2020    CMP:  Lab Results  Component Value Date   NA 138 07/22/2020   NA 139 04/21/2020   K 3.2 (L) 07/22/2020   CL 102 07/22/2020   CO2  24 07/22/2020   BUN 32 (H) 07/22/2020   BUN 35 (H) 04/21/2020   CREATININE 1.34 (H) 07/22/2020   PROT 6.1 (L) 07/20/2020   PROT 6.7 04/21/2020   ALBUMIN 3.5 07/20/2020   ALBUMIN 4.4 04/21/2020   BILITOT 1.1 07/20/2020   BILITOT 0.5 04/21/2020   ALKPHOS 60 07/20/2020   AST 14 (L) 07/20/2020   ALT 13 07/20/2020  .   Total Discharge time is about 33 minutes  Shon Hale M.D on 07/22/2020 at 6:06 PM  Go to [email or web address] -  for contact info  [facility] - Office  [phone]

## 2020-07-22 NOTE — Discharge Instructions (Signed)
1)Very low-salt diet advised 2)Weigh yourself daily, call if you gain more than 3 pounds in 1 day or more than 5 pounds in 1 week as your diuretic medications may need to be adjusted 3)Limit your Fluid  intake to no more than 60 ounces (1.8 Liters) per day 4) follow-up with a cardiologist Dr. Wyline Mood within a week for recheck and reevaluation 5) please note that your Coreg/carvedilol has been increased to 25 mg twice daily 6)Repeat CBC and BMP blood tests advised in about 1 week

## 2020-07-24 LAB — CULTURE, BLOOD (ROUTINE X 2)
Culture: NO GROWTH
Culture: NO GROWTH
Special Requests: ADEQUATE
Special Requests: ADEQUATE

## 2020-07-25 ENCOUNTER — Ambulatory Visit: Payer: Self-pay | Admitting: General Practice

## 2020-07-25 ENCOUNTER — Telehealth: Payer: Self-pay

## 2020-07-25 DIAGNOSIS — J438 Other emphysema: Secondary | ICD-10-CM

## 2020-07-25 DIAGNOSIS — I5021 Acute systolic (congestive) heart failure: Secondary | ICD-10-CM

## 2020-07-25 DIAGNOSIS — I10 Essential (primary) hypertension: Secondary | ICD-10-CM

## 2020-07-25 DIAGNOSIS — I5189 Other ill-defined heart diseases: Secondary | ICD-10-CM

## 2020-07-25 DIAGNOSIS — I519 Heart disease, unspecified: Secondary | ICD-10-CM

## 2020-07-25 NOTE — Telephone Encounter (Signed)
Patient was discharged from hospital on Dec 24th with CHF with exacerbation.  Needs [place] to call and schedule a hospital f/u please.

## 2020-07-25 NOTE — Patient Instructions (Addendum)
Visit Information  Patient Care Plan: RNCM: Care Plan for Heart Failure (Adult)    Problem Identified: Symptom Exacerbation (Heart Failure) Resolved 07/25/2020    Goal: Symptom Exacerbation Prevented or Minimized Completed 07/25/2020  Note:   Evidence-based guidance:   Perform or review cognitive and/or health literacy screening.   Assess understanding of adherence and barriers to treatment plan, as well as lifestyle changes; develop strategies to address barriers.   Establish a mutually-agreed-upon early intervention process to communicate with primary care provider when signs/symptoms worsen.   Facilitate timely posthospital discharge or emergency department treatment that includes intensive follow-up via telephone calls, home visit, telehealth monitoring and care at multidisciplinary heart failure clinic.   Adjust frequency and intensity of follow-up based on presentation, number of emergency department visits, hospital admissions and frequency and severity of symptom exacerbation.   Facilitate timely visit, usually within 1 week, with primary care provider following hospital discharge.   Collaborate with clinical pharmacist to address adverse drug reactions, drug interactions, subtherapeutic dosage, patient and family education.   Regularly screen for presence of depressive symptoms using a validated tool; consider pharmacologic therapy and/or referral for cognitive behavioral therapy when present.   Refer to community-based services, such as a heart failure support group, community Economist or peer support program.   Review immunization status; arrange receipt of needed vaccinations.   Prepare patient for home oxygen use based on signs/symptoms.   Notes:       Problem Identified: Symptom Exacerbation (Heart Failure)     Goal: RNCM: HF Exacerbation   Start Date: 07/25/2020  Expected End Date: 11/22/2020  Priority: High  Note:   Current Barriers:  Marland Kitchen Knowledge  deficits related to basic heart failure pathophysiology and self care management . Unable to independently manage HF as evidence of recent hospitalization from 07-19-2020 to 07-22-2020` . Lacks social connections . Does not contact provider office for questions/concerns- had not called for post discharge appointment follow up  . Lack of scale in home . Financial strain . Does not weigh daily- says scales may need batteries Nurse Case Manager Clinical Goal(s):   Over the next 60 days, patient will weigh self daily and record  Over the next 60 days, patient will verbalize understanding of Heart Failure Action Plan and when to call doctor  Over the next 60 days, patient will take all Heart Failure mediations as prescribed Interventions:  . Basic overview and discussion of pathophysiology of Heart Failure . Provided written and verbal education on low sodium diet . Reviewed Heart Failure Action Plan in depth and provided written copy . Assessed for scales in home- has scales at home, has not been using daily, says needs batteries . Discussed importance of daily weight- reviewed the importance of daily weights and to call the provider for -/+3 pounds in one day or -/+ 5 in a week.  . Reviewed role of diuretics in prevention of fluid overload- the patient states he does not take a diuretic on a regular basis. Education on writing down questions to ask the provider at provider visit. Education on the importance of effective management of HF and to prevent exacerbation . Review of heart healthy diet and monitoring sodium/fats and fluid intake . Review of EF%.  The patient had an ECHO while inpatient.  The patient states he does not understand what this is. Education on EF% and what this number represents. Patient states he does not see the cardiologist for 6 months. Education on getting a sooner appointment. Will  call the cardiologist office and ask for scheduler to call and get an appointment with the  patient for post hospital discharge appointment. Have called [facility] and spoke to [employer]. She will route message to scheduler and have them call to get the patient a post discharge appointment.  . - barriers to lifestyle changes reviewed and addressed . - barriers to treatment reviewed and addressed . - cognitive screening completed and reviewed . - healthy lifestyle promoted . - medication-adherence assessment completed . - self-awareness of signs/symptoms of worsening disease encouraged . - develop a rescue plan . - eat more whole grains, fruits and vegetables, lean meats and healthy fats . - follow rescue plan if symptoms flare-up . - know when to call the doctor . - track symptoms and what helps feel better or worse . - dress right for the weather, hot or cold Patient Goals/Self-Care Activities . Over the next 60 days, patient will:  - Take Heart Failure Medications as prescribed - Weigh daily and record (notify MD with 3 lb weight gain over night or 5 lb in a week) - Follow CHF Action Plan - Adhere to low sodium diet Follow Up Plan: The care management team will reach out to the patient again over the next 14  days.     Task: RNCM: Identify and Minimize Risk of Heart Failure Exacerbation   Note:   Care Management Activities:    - barriers to lifestyle changes reviewed and addressed - barriers to treatment reviewed and addressed - cognitive screening completed and reviewed - healthy lifestyle promoted - medication-adherence assessment completed - self-awareness of signs/symptoms of worsening disease encouraged           Patient Care Plan: RNCM: Emphysema Lung/COPD (Adult)    Problem Identified: RNCM; Management of Emphysema Lung/ COPD   Priority: High  Note:   Current Barriers:  Marland Kitchen Knowledge deficits related to basic understanding of COPD disease process . Knowledge deficits related to basic COPD self care/management . Knowledge deficit related to  importance of energy conservation . Limited Social Support . Unable to independently manage hypoxia/emphysema lung/COPD as evidence of hospitalization recently 07-19-2020 to 07-22-2020 . Does not contact provider office for questions/concerns  Case Manager Clinical Goal(s):  Over the next 60 days, patient will be able to verbalize understanding of COPD action plan and when to seek appropriate levels of medical care  Over the next 60 days, patient will engage in lite exercise as tolerated to build/regain stamina and strength and reduce shortness of breath through activity tolerance  Over the next 60 days, patient will verbalize basic understanding of COPD disease process and self care activities  Over the next 60 days, patient will not be hospitalized for COPD exacerbation  Interventions:   UNABLE to independently: manage chronic respiratory conditions as evidence of recent hospitalization.  Patient stated: 'I could not catch my breath"  Provided patient with basic written and verbal COPD education on self care/management/and exacerbation prevention   Provided patient with COPD action plan and reinforced importance of daily self assessment  Provided patient with education about the role of exercise in the management of COPD  Advised patient to engage in light exercise as tolerated 3-5 days a week  Provided education about and advised patient to utilize infection prevention strategies to reduce risk of respiratory infection  Patient Goals/Self-Care Activities:  . Call the provider office for changes in condition or worsening sx/sx . Take medications as prescribed . Keep appointments with the pcp  and cardiologist- RNCM called the pcp office and the cardiologist office and ask staff to call the patient to get post discharge appointments with the patient. Discharge on 07-22-2020 . Review of sx/sx of COPD exacerbation. Evaluation of oxygen needs.  The patient verbalized he does not use  oxygen. Follow Up Plan: Telephone follow up appointment with care management team member scheduled for: 08-05-2020 from Primary RNCM   Patient Care Plan: Hypertension (Adult)    Problem Identified: RNCM: Hypertension (Hypertension)   Priority: Medium    Goal: RNCM: Hypertension Care Management   Priority: Medium  Note:   Objective:  . Last practice recorded BP readings:  BP Readings from Last 3 Encounters:  07/22/20 136/72  04/21/20 (!) 150/84  10/20/19 130/82 .   Marland Kitchen Most recent eGFR/CrCl: No results found for: EGFR  No components found for: CRCL Current Barriers:  Marland Kitchen Knowledge Deficits related to basic understanding of hypertension pathophysiology and self care management . Knowledge Deficits related to understanding of medications prescribed for management of hypertension . Limited Social Support . Unable to independently manage HTN and other chronic conditions as evidence of recent hospitalization . Does not contact provider office for questions/concerns Case Manager Clinical Goal(s):  Marland Kitchen Over the next 60 days, patient will verbalize understanding of plan for hypertension management . Over the next 60 days, patient will not experience hospital admission. Hospital Admissions in last 6 months = 1 . Over the next 60 days, patient will attend all scheduled medical appointments: pcp office and cardiology office to call to secure a post discharge appointment with the patient. RNCM spoke to both offices today . Over the next 60 days, patient will demonstrate improved adherence to prescribed treatment plan for hypertension as evidenced by taking all medications as prescribed, monitoring and recording blood pressure as directed, adhering to low sodium/DASH diet . Over the next 60 days, patient will demonstrate improved health management independence as evidenced by checking blood pressure as directed and notifying PCP if SBP>160 or DBP > 90, taking all medications as prescribe, and adhering to a  low sodium diet as discussed. . Over the next 60 days, patient will verbalize basic understanding of hypertension disease process and self health management plan as evidenced by improved adherence to provider recommendations, working with CCM team to meet health and wellness goals, and maintaining normalized blood pressures.  Interventions:  Marland Kitchen UNABLE to independently:manage HTN as evidence of recent hospitalization . Evaluation of current treatment plan related to hypertension self management and patient's adherence to plan as established by provider. . Provided education to patient re: stroke prevention, s/s of heart attack and stroke, DASH diet, complications of uncontrolled blood pressure . Reviewed medications with patient and discussed importance of compliance . Discussed plans with patient for ongoing care management follow up and provided patient with direct contact information for care management team . Advised patient, providing education and rationale, to monitor blood pressure daily and record, calling PCP for findings outside established parameters.  . Reviewed scheduled/upcoming provider appointments including: pcp office and cardiology office to call to schedule patient for post discharge appointments . - blood pressure trends reviewed . - home or ambulatory blood pressure monitoring encouraged Patient Goals/Self-Care Activities . Over the next 60 days, patient will:  - UNABLE to independently manage HTN and other chronic conditions Calls provider office for new concerns, questions, or BP outside discussed parameters Checks BP and records as discussed Follow Up Plan: Telephone follow up appointment with care management team member scheduled for:  08-05-2020   Task: RNCM: Identify and Monitor Blood Pressure Elevation   Note:   Care Management Activities:    - blood pressure trends reviewed - home or ambulatory blood pressure monitoring encouraged         Taylor Cross was given  information about Chronic Care Management services today including:  1. CCM service includes personalized support from designated clinical staff supervised by his physician, including individualized plan of care and coordination with other care providers 2. 24/7 contact phone numbers for assistance for urgent and routine care needs. 3. Service will only be billed when office clinical staff spend 20 minutes or more in a month to coordinate care. 4. Only one practitioner may furnish and bill the service in a calendar month. 5. The patient may stop CCM services at any time (effective at the end of the month) by phone call to the office staff. 6. The patient will be responsible for cost sharing (co-pay) of up to 20% of the service fee (after annual deductible is met).  Patient agreed to services and verbal consent obtained.   The patient verbalized understanding of instructions, educational materials, and care plan provided today and declined offer to receive copy of patient instructions, educational materials, and care plan.   Telephone follow up appointment with care management team member scheduled for: 08-05-2020  Vanita Ingles

## 2020-07-25 NOTE — Chronic Care Management (AMB) (Signed)
Chronic Care Management   Initial Visit Note  07/25/2020 Name: Taylor Cross MRN: [id number] DOB: [birth year]  Referred by: Claretta Fraise, MD Reason for referral : Chronic Care Management (RNCM: Initial outreach and Red EMMI for post discharged call- 07-19-2020 to 07-22-2020)   Taylor Cross is a 74 y.o. year old male who is a primary care patient of [name], Cletus Gash, MD. The CCM team was consulted for assistance with chronic disease management and care coordination needs related to CHF, HTN and COPD  Review of patient status, including review of consultants reports, relevant laboratory and other test results, and collaboration with appropriate care team members and the patient's provider was performed as part of comprehensive patient evaluation and provision of chronic care management services.    SDOH (Social Determinants of Health) assessments performed: Yes See Care Plan activities for detailed interventions related to SDOH     Medications: Outpatient Encounter Medications as of 07/25/2020  Medication Sig  . acetaminophen (TYLENOL) 325 MG tablet Take 2 tablets (650 mg total) by mouth every 6 (six) hours as needed for mild pain, fever or headache (or Fever >/= 101).  Marland Kitchen albuterol (VENTOLIN HFA) 108 (90 Base) MCG/ACT inhaler Inhale 2 puffs into the lungs every 4 (four) hours as needed for wheezing or shortness of breath.  . allopurinol (ZYLOPRIM) 100 MG tablet TAKE 1 TABLET DAILY (Patient taking differently: Take 100 mg by mouth daily.)  . aspirin 81 MG chewable tablet Chew 1 tablet (81 mg total) by mouth daily with breakfast.  . azithromycin (ZITHROMAX) 500 MG tablet Take 1 tablet (500 mg total) by mouth daily for 5 days.  . carvedilol (COREG) 25 MG tablet Take 1 tablet (25 mg total) by mouth 2 (two) times daily with a meal.  . cefdinir (OMNICEF) 300 MG capsule Take 1 capsule (300 mg total) by mouth 2 (two) times daily for 5 days.  . finasteride (PROSCAR) 5 MG tablet Take 1 tablet (5 mg  total) by mouth daily.  . fluticasone (FLONASE) 50 MCG/ACT nasal spray USE 2 SPRAYS IN EACH NOSTRIL ONCE DAILY. (Patient taking differently: Place 2 sprays into both nostrils daily.)  . furosemide (LASIX) 40 MG tablet Take 1 tablet (40 mg total) by mouth daily.  Marland Kitchen gabapentin (NEURONTIN) 300 MG capsule TAKE  (1)  CAPSULE  TWICE DAILY. (Patient taking differently: Take 300 mg by mouth 2 (two) times daily.)  . guaiFENesin (MUCINEX) 600 MG 12 hr tablet Take 1 tablet (600 mg total) by mouth 2 (two) times daily.  Marland Kitchen guaiFENesin-dextromethorphan (ROBITUSSIN DM) 100-10 MG/5ML syrup Take 5 mLs by mouth every 4 (four) hours as needed for cough.  . hydroquinone 4 % cream APPLY TO AFFECTED AREAS TWICE A DAY (Patient taking differently: Apply 1 application topically in the morning and at bedtime.)  . loratadine (CLARITIN) 10 MG tablet TAKE ONE TABLET AT BEDTIME (Patient taking differently: Take 10 mg by mouth at bedtime.)  . mupirocin ointment (BACTROBAN) 2 % Apply 2 (two) times daily. To affected areas (Patient taking differently: Apply 1 application topically 2 (two) times daily. To affected areas)  . pantoprazole (PROTONIX) 40 MG tablet TAKE 1 TABLET ONCE DAILY FOR REFLUX (Patient taking differently: Take 40 mg by mouth daily.)  . potassium chloride (KLOR-CON) 10 MEQ tablet Take 1 tablet (10 mEq total) by mouth daily. Take While taking Lasix/furosemide  . pravastatin (PRAVACHOL) 40 MG tablet Take 1 tablet (40 mg total) by mouth daily.  . sertraline (ZOLOFT) 50 MG tablet Take 1 tablet (50  mg total) by mouth daily.  . tamsulosin (FLOMAX) 0.4 MG CAPS capsule TAKE (2) CAPSULES DAILY. (Patient taking differently: Take 0.8 mg by mouth daily.)  . traMADol (ULTRAM) 50 MG tablet Take 1 tablet (50 mg total) by mouth 2 (two) times daily. For back pain   No facility-administered encounter medications on file as of 07/25/2020.     Objective:   Patient Care Plan: RNCM: Care Plan for Heart Failure (Adult)    Problem  Identified: Symptom Exacerbation (Heart Failure) Resolved 07/25/2020    Goal: Symptom Exacerbation Prevented or Minimized Completed 07/25/2020  Note:   Evidence-based guidance:   Perform or review cognitive and/or health literacy screening.   Assess understanding of adherence and barriers to treatment plan, as well as lifestyle changes; develop strategies to address barriers.   Establish a mutually-agreed-upon early intervention process to communicate with primary care provider when signs/symptoms worsen.   Facilitate timely posthospital discharge or emergency department treatment that includes intensive follow-up via telephone calls, home visit, telehealth monitoring and care at multidisciplinary heart failure clinic.   Adjust frequency and intensity of follow-up based on presentation, number of emergency department visits, hospital admissions and frequency and severity of symptom exacerbation.   Facilitate timely visit, usually within 1 week, with primary care provider following hospital discharge.   Collaborate with clinical pharmacist to address adverse drug reactions, drug interactions, subtherapeutic dosage, patient and family education.   Regularly screen for presence of depressive symptoms using a validated tool; consider pharmacologic therapy and/or referral for cognitive behavioral therapy when present.   Refer to community-based services, such as a heart failure support group, community Economist or peer support program.   Review immunization status; arrange receipt of needed vaccinations.   Prepare patient for home oxygen use based on signs/symptoms.   Notes:       Problem Identified: Symptom Exacerbation (Heart Failure)     Goal: RNCM: HF Exacerbation   Start Date: 07/25/2020  Expected End Date: 11/22/2020  Priority: High  Note:   Current Barriers:  Marland Kitchen Knowledge deficits related to basic heart failure pathophysiology and self care management . Unable to  independently manage HF as evidence of recent hospitalization from 07-19-2020 to 07-22-2020` . Lacks social connections . Does not contact provider office for questions/concerns- had not called for post discharge appointment follow up  . Lack of scale in home . Financial strain . Does not weigh daily- says scales may need batteries Nurse Case Manager Clinical Goal(s):   Over the next 60 days, patient will weigh self daily and record  Over the next 60 days, patient will verbalize understanding of Heart Failure Action Plan and when to call doctor  Over the next 60 days, patient will take all Heart Failure mediations as prescribed Interventions:  . Basic overview and discussion of pathophysiology of Heart Failure . Provided written and verbal education on low sodium diet . Reviewed Heart Failure Action Plan in depth and provided written copy . Assessed for scales in home- has scales at home, has not been using daily, says needs batteries . Discussed importance of daily weight- reviewed the importance of daily weights and to call the provider for -/+3 pounds in one day or -/+ 5 in a week.  . Reviewed role of diuretics in prevention of fluid overload- the patient states he does not take a diuretic on a regular basis. Education on writing down questions to ask the provider at provider visit. Education on the importance of effective management of HF and  to prevent exacerbation . Review of heart healthy diet and monitoring sodium/fats and fluid intake . Review of EF%.  The patient had an ECHO while inpatient.  The patient states he does not understand what this is. Education on EF% and what this number represents. Patient states he does not see the cardiologist for 6 months. Education on getting a sooner appointment. Will call the cardiologist office and ask for scheduler to call and get an appointment with the patient for post hospital discharge appointment. Have called [facility] and spoke to  [employer]. She will route message to scheduler and have them call to get the patient a post discharge appointment.  . - barriers to lifestyle changes reviewed and addressed . - barriers to treatment reviewed and addressed . - cognitive screening completed and reviewed . - healthy lifestyle promoted . - medication-adherence assessment completed . - self-awareness of signs/symptoms of worsening disease encouraged . - develop a rescue plan . - eat more whole grains, fruits and vegetables, lean meats and healthy fats . - follow rescue plan if symptoms flare-up . - know when to call the doctor . - track symptoms and what helps feel better or worse . - dress right for the weather, hot or cold Patient Goals/Self-Care Activities . Over the next 60 days, patient will:  - Take Heart Failure Medications as prescribed - Weigh daily and record (notify MD with 3 lb weight gain over night or 5 lb in a week) - Follow CHF Action Plan - Adhere to low sodium diet Follow Up Plan: The care management team will reach out to the patient again over the next 14  days.     Task: RNCM: Identify and Minimize Risk of Heart Failure Exacerbation   Note:   Care Management Activities:    - barriers to lifestyle changes reviewed and addressed - barriers to treatment reviewed and addressed - cognitive screening completed and reviewed - healthy lifestyle promoted - medication-adherence assessment completed - self-awareness of signs/symptoms of worsening disease encouraged       Problem Identified: Disease Progression (Heart Failure)     Problem Identified: Activity Tolerance (Heart Failure)     Patient Care Plan: RNCM: Emphysema Lung/COPD (Adult)    Problem Identified: RNCM; Management of Emphysema Lung/ COPD   Priority: High  Note:   Current Barriers:  Marland Kitchen Knowledge deficits related to basic understanding of COPD disease process . Knowledge deficits related to basic COPD self care/management . Knowledge  deficit related to importance of energy conservation . Limited Social Support . Unable to independently manage hypoxia/emphysema lung/COPD as evidence of hospitalization recently 07-19-2020 to 07-22-2020 . Does not contact provider office for questions/concerns  Case Manager Clinical Goal(s):  Over the next 60 days, patient will be able to verbalize understanding of COPD action plan and when to seek appropriate levels of medical care  Over the next 60 days, patient will engage in lite exercise as tolerated to build/regain stamina and strength and reduce shortness of breath through activity tolerance  Over the next 60 days, patient will verbalize basic understanding of COPD disease process and self care activities  Over the next 60 days, patient will not be hospitalized for COPD exacerbation  Interventions:   UNABLE to independently: manage chronic respiratory conditions as evidence of recent hospitalization.  Patient stated: 'I could not catch my breath"  Provided patient with basic written and verbal COPD education on self care/management/and exacerbation prevention   Provided patient with COPD action plan and reinforced  importance of daily self assessment  Provided patient with education about the role of exercise in the management of COPD  Advised patient to engage in light exercise as tolerated 3-5 days a week  Provided education about and advised patient to utilize infection prevention strategies to reduce risk of respiratory infection  Patient Goals/Self-Care Activities:  . Call the provider office for changes in condition or worsening sx/sx . Take medications as prescribed . Keep appointments with the pcp and cardiologist- RNCM called the pcp office and the cardiologist office and ask staff to call the patient to get post discharge appointments with the patient. Discharge on 07-22-2020 . Review of sx/sx of COPD exacerbation. Evaluation of oxygen needs.  The patient verbalized he  does not use oxygen. Follow Up Plan: Telephone follow up appointment with care management team member scheduled for: 08-05-2020 from Primary RNCM   Patient Care Plan: Hypertension (Adult)    Problem Identified: RNCM: Hypertension (Hypertension)   Priority: Medium    Goal: RNCM: Hypertension Care Management   Priority: Medium  Note:   Objective:  . Last practice recorded BP readings:  BP Readings from Last 3 Encounters:  07/22/20 136/72  04/21/20 (!) 150/84  10/20/19 130/82 .   Marland Kitchen Most recent eGFR/CrCl: No results found for: EGFR  No components found for: CRCL Current Barriers:  Marland Kitchen Knowledge Deficits related to basic understanding of hypertension pathophysiology and self care management . Knowledge Deficits related to understanding of medications prescribed for management of hypertension . Limited Social Support . Unable to independently manage HTN and other chronic conditions as evidence of recent hospitalization . Does not contact provider office for questions/concerns Case Manager Clinical Goal(s):  Marland Kitchen Over the next 60 days, patient will verbalize understanding of plan for hypertension management . Over the next 60 days, patient will not experience hospital admission. Hospital Admissions in last 6 months = 1 . Over the next 60 days, patient will attend all scheduled medical appointments: pcp office and cardiology office to call to secure a post discharge appointment with the patient. RNCM spoke to both offices today . Over the next 60 days, patient will demonstrate improved adherence to prescribed treatment plan for hypertension as evidenced by taking all medications as prescribed, monitoring and recording blood pressure as directed, adhering to low sodium/DASH diet . Over the next 60 days, patient will demonstrate improved health management independence as evidenced by checking blood pressure as directed and notifying PCP if SBP>160 or DBP > 90, taking all medications as prescribe, and  adhering to a low sodium diet as discussed. . Over the next 60 days, patient will verbalize basic understanding of hypertension disease process and self health management plan as evidenced by improved adherence to provider recommendations, working with CCM team to meet health and wellness goals, and maintaining normalized blood pressures.  Interventions:  Marland Kitchen UNABLE to independently:manage HTN as evidence of recent hospitalization . Evaluation of current treatment plan related to hypertension self management and patient's adherence to plan as established by provider. . Provided education to patient re: stroke prevention, s/s of heart attack and stroke, DASH diet, complications of uncontrolled blood pressure . Reviewed medications with patient and discussed importance of compliance . Discussed plans with patient for ongoing care management follow up and provided patient with direct contact information for care management team . Advised patient, providing education and rationale, to monitor blood pressure daily and record, calling PCP for findings outside established parameters.  . Reviewed scheduled/upcoming provider appointments including: pcp office and  cardiology office to call to schedule patient for post discharge appointments . - blood pressure trends reviewed . - home or ambulatory blood pressure monitoring encouraged Patient Goals/Self-Care Activities . Over the next 60 days, patient will:  - UNABLE to independently manage HTN and other chronic conditions Calls provider office for new concerns, questions, or BP outside discussed parameters Checks BP and records as discussed Follow Up Plan: Telephone follow up appointment with care management team member scheduled for: 08-05-2020   Task: RNCM: Identify and Monitor Blood Pressure Elevation   Note:   Care Management Activities:    - blood pressure trends reviewed - home or ambulatory blood pressure monitoring encouraged         Mr.  Gell was given information about Chronic Care Management services today including:  1. CCM service includes personalized support from designated clinical staff supervised by his physician, including individualized plan of care and coordination with other care providers 2. 24/7 contact phone numbers for assistance for urgent and routine care needs. 3. Service will only be billed when office clinical staff spend 20 minutes or more in a month to coordinate care. 4. Only one practitioner may furnish and bill the service in a calendar month. 5. The patient may stop CCM services at any time (effective at the end of the month) by phone call to the office staff. 6. The patient will be responsible for cost sharing (co-pay) of up to 20% of the service fee (after annual deductible is met).  Patient agreed to services and verbal consent obtained.   Plan:   Telephone follow up appointment with care management team member scheduled for: 08-05-2020  Noreene Larsson RN, MSN, [facility]  Mobile: [phone]

## 2020-07-25 NOTE — Telephone Encounter (Signed)
Discharge instructions say to follow up with Cardiology. No follow up here needed.

## 2020-07-28 ENCOUNTER — Ambulatory Visit: Payer: Medicare Other | Admitting: *Deleted

## 2020-07-28 DIAGNOSIS — J438 Other emphysema: Secondary | ICD-10-CM

## 2020-07-28 DIAGNOSIS — I5021 Acute systolic (congestive) heart failure: Secondary | ICD-10-CM

## 2020-07-28 NOTE — Patient Instructions (Signed)
Follow up plan: Patient will call PCP with any new or worsening symptoms Follow-up with cardiology on 08/03/20 Follow-up with PCP on 08/05/20 Telephone call with RNCM on 08/05/20  Demetrios Loll, BSN, RN-BC Embedded Chronic Care Manager [facility] / [facility] Care Management Direct Dial: [phone]

## 2020-07-28 NOTE — Chronic Care Management (AMB) (Signed)
  Chronic Care Management   Red EMMI Alert  07/28/2020 Name: Taylor Cross MRN: [id number] DOB: 1945/09/23  Patient was outreached by an automated EMMI telephone call yesterday and indicated that he did not have a follow-up appointment scheduled. Reviewed upcoming appointments with patient: cardiology on 08/03/20 and PCP on 08/05/20 as well as telephone call from [facility] on 08/05/20. Patient verbalized awareness of appointments. Per patient, he is feeling better but is still a little weak. He does not have any questions or needs at this time.     Follow up plan: Patient will call PCP with any new or worsening symptoms Follow-up with cardiology on 08/03/20 Follow-up with PCP on 08/05/20 Telephone call with RNCM on 08/05/20  Demetrios Loll, BSN, RN-BC Embedded Chronic Care Manager [facility] / [facility] Care Management Direct Dial: [phone]

## 2020-08-03 ENCOUNTER — Ambulatory Visit (INDEPENDENT_AMBULATORY_CARE_PROVIDER_SITE_OTHER): Payer: Medicare Other | Admitting: Cardiology

## 2020-08-03 ENCOUNTER — Encounter: Payer: Self-pay | Admitting: Cardiology

## 2020-08-03 VITALS — BP 100/60 | HR 63 | Resp 16 | Ht 68.0 in | Wt 179.0 lb

## 2020-08-03 DIAGNOSIS — Z79899 Other long term (current) drug therapy: Secondary | ICD-10-CM

## 2020-08-03 DIAGNOSIS — I5022 Chronic systolic (congestive) heart failure: Secondary | ICD-10-CM | POA: Diagnosis not present

## 2020-08-03 MED ORDER — CARVEDILOL 25 MG PO TABS: 25.0000 mg | ORAL_TABLET | Freq: Two times a day (BID) | ORAL | 3 refills | Status: DC

## 2020-08-03 MED ORDER — FUROSEMIDE 20 MG PO TABS: 20.0000 mg | ORAL_TABLET | Freq: Every day | ORAL | 3 refills | Status: DC

## 2020-08-03 NOTE — Patient Instructions (Signed)
Medication Instructions:  DECREASE Lasix to 20 mg daily    I refilled your Coreg   *If you need a refill on your cardiac medications before your next appointment, please call your pharmacy*   Lab Work: BMET,magnesium  If you have labs (blood work) drawn today and your tests are completely normal, you will receive your results only by: Marland Kitchen MyChart Message (if you have MyChart) OR . A paper copy in the mail If you have any lab test that is abnormal or we need to change your treatment, we will call you to review the results.   Testing/Procedures: Your physician has requested that you have an echocardiogram in 1 month. Echocardiography is a painless test that uses sound waves to create images of your heart. It provides your doctor with information about the size and shape of your heart and how well your heart's chambers and valves are working. This procedure takes approximately one hour. There are no restrictions for this procedure.     Follow-Up: At [facility], you and your health needs are our priority.  As part of our continuing mission to provide you with exceptional heart care, we have created designated Provider Care Teams.  These Care Teams include your primary Cardiologist (physician) and Advanced Practice Providers (APPs -  Physician Assistants and Nurse Practitioners) who all work together to provide you with the care you need, when you need it.  We recommend signing up for the patient portal called "MyChart".  Sign up information is provided on this After Visit Summary.  MyChart is used to connect with patients for Virtual Visits (Telemedicine).  Patients are able to view lab/test results, encounter notes, upcoming appointments, etc.  Non-urgent messages can be sent to your provider as well.   To learn more about what you can do with MyChart, go to [email or web address].    Your next appointment:   6 week(s)  The format for your next appointment:   In  Person  Provider:   You may see Dina Rich, MD or the following Advanced Practice Provider on your designated Care Team:    Nena Polio, NP    Other Instructions None       Thank you for choosing [facility] !

## 2020-08-03 NOTE — Progress Notes (Signed)
Clinical Summary Taylor Cross is a 75 y.o.male seen today for follow up of the following medical problems. This is a focused visit on history of chronic systolic HF and recent hospital admission.   1. Chronic Systolic/Diastolic heart failure  - 10/2013 echo which showed LVEF 40-45%, mid-inferolateral wall hypokinesis, and grade II diastolic dysfunction.  - repeat echo 08/2014 LVEF 35-40%, basal to mid inferolateral and inferior walls, grade I diastolic dysfunction - completed lexiscan which showed moderate sized moderate to severe intensity inferolateral wall defect and small apical to mid inferior wall defect. Both areas were fixed and consistent with scar.   - lisinopril stoppedby other provider due to decreasingrenal function.   12/2018 echo: LVEF 50-55%   - admitted 06/2020 with CHF exacerbation and pneumonia - echo from 12/22/2021shows that EF is down to 35 to 40% from 50 to 55% back in June 2020, - home SBP's 100s.  - some orthostatic symptoms at times - breathing has done well since discharge     2. CKD III - Cr has improved off ACE-I. Past Medical History:  Diagnosis Date  . Combined systolic and diastolic heart failure ([place])   . Hyperlipidemia      Allergies  Allergen Reactions  . Neosporin [Neomycin-Bacitracin Zn-Polymyx] Rash  . Voltaren [Diclofenac Sodium] Other (See Comments)    GI upset     Current Outpatient Medications  Medication Sig Dispense Refill  . acetaminophen (TYLENOL) 325 MG tablet Take 2 tablets (650 mg total) by mouth every 6 (six) hours as needed for mild pain, fever or headache (or Fever >/= 101). 12 tablet 0  . albuterol (VENTOLIN HFA) 108 (90 Base) MCG/ACT inhaler Inhale 2 puffs into the lungs every 4 (four) hours as needed for wheezing or shortness of breath. 18 g 2  . allopurinol (ZYLOPRIM) 100 MG tablet TAKE 1 TABLET DAILY (Patient taking differently: Take 100 mg by mouth daily.) 30 tablet 3  . aspirin 81 MG chewable tablet  Chew 1 tablet (81 mg total) by mouth daily with breakfast. 120 tablet 3  . carvedilol (COREG) 25 MG tablet Take 1 tablet (25 mg total) by mouth 2 (two) times daily with a meal. 60 tablet 5  . finasteride (PROSCAR) 5 MG tablet Take 1 tablet (5 mg total) by mouth daily. 90 tablet 1  . fluticasone (FLONASE) 50 MCG/ACT nasal spray USE 2 SPRAYS IN EACH NOSTRIL ONCE DAILY. (Patient taking differently: Place 2 sprays into both nostrils daily.) 16 g 5  . furosemide (LASIX) 40 MG tablet Take 1 tablet (40 mg total) by mouth daily. 30 tablet 1  . gabapentin (NEURONTIN) 300 MG capsule TAKE  (1)  CAPSULE  TWICE DAILY. (Patient taking differently: Take 300 mg by mouth 2 (two) times daily.) 180 capsule 1  . guaiFENesin (MUCINEX) 600 MG 12 hr tablet Take 1 tablet (600 mg total) by mouth 2 (two) times daily. 20 tablet 0  . guaiFENesin-dextromethorphan (ROBITUSSIN DM) 100-10 MG/5ML syrup Take 5 mLs by mouth every 4 (four) hours as needed for cough. 118 mL 0  . hydroquinone 4 % cream APPLY TO AFFECTED AREAS TWICE A DAY (Patient taking differently: Apply 1 application topically in the morning and at bedtime.) 28.35 g 0  . loratadine (CLARITIN) 10 MG tablet TAKE ONE TABLET AT BEDTIME (Patient taking differently: Take 10 mg by mouth at bedtime.) 90 tablet 1  . mupirocin ointment (BACTROBAN) 2 % Apply 2 (two) times daily. To affected areas (Patient taking differently: Apply 1 application topically  2 (two) times daily. To affected areas) 22 g 2  . pantoprazole (PROTONIX) 40 MG tablet TAKE 1 TABLET ONCE DAILY FOR REFLUX (Patient taking differently: Take 40 mg by mouth daily.) 90 tablet 1  . potassium chloride (KLOR-CON) 10 MEQ tablet Take 1 tablet (10 mEq total) by mouth daily. Take While taking Lasix/furosemide 30 tablet 2  . pravastatin (PRAVACHOL) 40 MG tablet Take 1 tablet (40 mg total) by mouth daily. 90 tablet 1  . sertraline (ZOLOFT) 50 MG tablet Take 1 tablet (50 mg total) by mouth daily. 90 tablet 1  . tamsulosin  (FLOMAX) 0.4 MG CAPS capsule TAKE (2) CAPSULES DAILY. (Patient taking differently: Take 0.8 mg by mouth daily.) 180 capsule 1  . traMADol (ULTRAM) 50 MG tablet Take 1 tablet (50 mg total) by mouth 2 (two) times daily. For back pain 60 tablet 5   No current facility-administered medications for this visit.     Past Surgical History:  Procedure Laterality Date  . HERNIA REPAIR    . TONSILLECTOMY       Allergies  Allergen Reactions  . Neosporin [Neomycin-Bacitracin Zn-Polymyx] Rash  . Voltaren [Diclofenac Sodium] Other (See Comments)    GI upset      Family History  Problem Relation Age of Onset  . Heart disease Mother   . Diabetes Mother   . Heart failure Mother   . COPD Father   . Heart attack Brother 12     Social History Taylor Cross reports that he has never smoked. He has never used smokeless tobacco. Taylor Cross reports no history of alcohol use.   Review of Systems CONSTITUTIONAL: No weight loss, fever, chills, weakness or fatigue.  HEENT: Eyes: No visual loss, blurred vision, double vision or yellow sclerae.No hearing loss, sneezing, congestion, runny nose or sore throat.  SKIN: No rash or itching.  CARDIOVASCULAR: per hpi RESPIRATORY: No shortness of breath, cough or sputum.  GASTROINTESTINAL: No anorexia, nausea, vomiting or diarrhea. No abdominal pain or blood.  GENITOURINARY: No burning on urination, no polyuria NEUROLOGICAL: No headache, dizziness, syncope, paralysis, ataxia, numbness or tingling in the extremities. No change in bowel or bladder control.  MUSCULOSKELETAL: No muscle, back pain, joint pain or stiffness.  LYMPHATICS: No enlarged nodes. No history of splenectomy.  PSYCHIATRIC: No history of depression or anxiety.  ENDOCRINOLOGIC: No reports of sweating, cold or heat intolerance. No polyuria or polydipsia.  Marland Kitchen   Physical Examination Today's Vitals   08/03/20 1344  BP: 100/60  Pulse: 63  Resp: 16  SpO2: 94%  Weight: 179 lb (81.2 kg)   Height: _0  (1.727 m)   Body mass index is 27.22 kg/m.  Gen: resting comfortably, no acute distress HEENT: no scleral icterus, pupils equal round and reactive, no palptable cervical adenopathy,  CV: RRR, no mr/g, no jvd Resp: Clear to auscultation bilaterally GI: abdomen is soft, non-tender, non-distended, normal bowel sounds, no hepatosplenomegaly MSK: extremities are warm, no edema.  Skin: warm, no rash Neuro:  no focal deficits Psych: appropriate affect   Diagnostic Studies 10/2013 Echo Left ventricle: The cavity size was mildly dilated. Wall thickness was normal. Systolic function was mildly to moderately reduced. The estimated ejection fraction is 40%. Features are consistent with a pseudonormal left ventricular filling pattern, with concomitant abnormal relaxation and increased filling pressure (grade 2 diastolic dysfunction). Doppler parameters are consistent with high ventricular filling pressure. - Regional wall motion abnormality: Mild to moderate hypokinesis of the mid inferolateral myocardium. The remaining walls are mildly hypokinetic. -  Aortic valve: Trileaflet; mildly thickened, mildly calcified leaflets. There was no stenosis. - Mitral valve: Mildly thickened leaflets . Mild to moderate regurgitation. - Left atrium: The atrium was moderately dilated. - Tricuspid valve: Mild regurgitation. Inadequate TR jet to accurately assess pulmonary pressures. - Pericardium, extracardiac: There was a left pleural effusion.  11/11/13 Lexiscan MPI Gated imaging reveals an EDV of 223, ESV of 153, TID ratio 0.99, and LVEF of 31% with global hypokinesis, most prominent in the apex and inferolateral walls.  IMPRESSION: Intermediate risk Lexiscan Cardiolite. No diagnostic ST segment changes were noted to indicate ischemia, frequent PVCs were seen without sustained arrhythmia. Perfusion imaging is most consistent with scar affecting the inferior wall and  inferolateral wall as outlined, no large ischemic zones however. LVEF is calculated at 31% with global hypokinesis that is most prominent at the apex and inferior wall. EDV indicates moderate to severe chamber dilatation. Findings are consistent with probable ischemic cardiomyopathy.   08/2014 echo Study Conclusions  - Left ventricle: The cavity size was mildly dilated. Wall thickness was increased in a pattern of moderate LVH. Systolic function was moderately reduced. The estimated ejection fraction was in the range of 35% to 40%. There is hypokinesis to akinesis of the basal-mid inferolateral and inferior myocardium. Doppler parameters are consistent with abnormal left ventricular relaxation (grade 1 diastolic dysfunction). - Aortic valve: Mildly to moderately calcified annulus. Trileaflet. - Mitral valve: Mildly calcified annulus. There was mild regurgitation. - Left atrium: The atrium was mildly to moderately dilated. - Right atrium: Central venous pressure (est): 3 mm Hg. - Atrial septum: Possible PFO or secundum ASD with apparent left to right flow noted by color Doppler. Cannot exclude vigorous venous return adjacent to septum. Agitated saline study could be considered for further evaluation. - Tricuspid valve: There was trivial regurgitation. - Pulmonary arteries: PA peak pressure: 17 mm Hg (S). - Pericardium, extracardiac: There was no pericardial effusion.  Recommendations: Moderate LVH with mild LV chamber dilatation and LVEF approximately 35-40%. Hypokinesis to akinesis of the mid to basal inferolateral/inferior walls noted suggestive of ischemic cardiomyopathy. Grade 1 diastolic dysfunction. Compared to prior study in April 2015, wall motion has improved somewhat and ejection fraction looks to be higher, although graded in similar range last time. Mild to moderate left atrial enlargement. Mild MAC with mild mitral regurgitation. Possible PFO or  secundum ASD with apparent left to right flow noted by color Doppler. Cannot exclude vigorous venous return adjacent to septum. Agitated saline study could be considered for further evaluation. Trivial tricuspid regurgitation with PASP normal range.       12/2018 echo 1. The left ventricle has low normal systolic function, with an ejection fraction of 50-55%. The cavity size was normal. There is mildly increased left ventricular wall thickness. Left ventricular diastolic Doppler parameters are consistent with  impaired relaxation. Elevated mean left atrial pressure. 2. The right ventricle has normal systolic function. The cavity was normal. There is no increase in right ventricular wall thickness. 3. Left atrial size was moderately dilated. 4. No evidence of mitral valve stenosis. 5. The aortic valve is tricuspid. No stenosis of the aortic valve. 6. The aortic root is normal in size and structure. 7. Pulmonary hypertension is indeterminant, inadequate TR jet.   06/2020 echo IMPRESSIONS    1. Left ventricular ejection fraction, by estimation, is 35 to 40%. The  left ventricle has moderately decreased function. The left ventricle  demonstrates regional wall motion abnormalities (see scoring  diagram/findings for description). There is  mild  left ventricular hypertrophy. Left ventricular diastolic parameters are  indeterminate.  2. Right ventricular systolic function is normal. The right ventricular  size is normal. Tricuspid regurgitation signal is inadequate for assessing  PA pressure.  3. Left atrial size was mild to moderately dilated.  4. The mitral valve is grossly normal. Mild mitral valve regurgitation.  5. The aortic valve is tricuspid. There is moderate calcification of the  aortic valve. Aortic valve regurgitation is not visualized. Mild aortic  valve stenosis. Aortic valve mean gradient measures 10.0 mmHg.  6. The inferior vena cava is normal in size  with greater than 50%  respiratory variability, suggesting right atrial pressure of 3 mmHg.  7. Left pleural effusion is noted.     Assessment and Plan   1. Chronic Systolic/diastolic heart failure -Medical therapy has been limited due to orthostatic dizziness, renal dysfunction -LVEF had previously normalized, now with recurrent dysfunction noted during recent admission with pneumonia - continue medical therapy, repeat echo in 1 month. If ongoing dysfunction may need to consider ischemic evaluation.  - history of CKD, prior AKI on ACEI has not been on ACE/ARB/ARNI. May consider low dose aldactone pending bp's and labs a tf/u. SBP 100 today, would not want much lower particularly with some orthostatic symptoms - with orthostatic symptoms lower lasix to 75m daily. Continue his KCl dose for now until labs are back.   Echo 1 month, f/u 6 weeks.     JArnoldo Lenis M.D.

## 2020-08-05 ENCOUNTER — Ambulatory Visit (INDEPENDENT_AMBULATORY_CARE_PROVIDER_SITE_OTHER): Payer: Medicare Other

## 2020-08-05 ENCOUNTER — Ambulatory Visit (INDEPENDENT_AMBULATORY_CARE_PROVIDER_SITE_OTHER): Payer: Medicare Other | Admitting: Family Medicine

## 2020-08-05 ENCOUNTER — Other Ambulatory Visit: Payer: Self-pay

## 2020-08-05 ENCOUNTER — Telehealth: Payer: Medicare Other | Admitting: *Deleted

## 2020-08-05 ENCOUNTER — Encounter: Payer: Self-pay | Admitting: Family Medicine

## 2020-08-05 VITALS — BP 133/81 | HR 62 | Temp 97.1°F | Resp 18 | Ht 68.0 in | Wt 178.4 lb

## 2020-08-05 DIAGNOSIS — J438 Other emphysema: Secondary | ICD-10-CM

## 2020-08-05 DIAGNOSIS — I5042 Chronic combined systolic (congestive) and diastolic (congestive) heart failure: Secondary | ICD-10-CM

## 2020-08-05 DIAGNOSIS — I10 Essential (primary) hypertension: Secondary | ICD-10-CM

## 2020-08-05 NOTE — Progress Notes (Signed)
Subjective:  Patient ID: Taylor Cross, male    DOB: 14-Jun-1946  Age: 75 y.o. MRN: [id number]  CC: Hospitalization Follow-up   HPI Taylor Cross presents for hospital follow-up from recent pneumonia and congestive heart failure.  He was placed on Lasix and potassium as well as having an increase in his carvedilol.  He responded well to antibiotics.  He is here today saying he finished that up.  He is now feeling much better.  He denies shortness of breath.  He is having no fever or chills.  His activities are getting back to normal.  He notices that the carvedilol lowers his blood pressure more but it is not causing side effects such as dizziness or syncope.  Depression screen [facility] 2/9 08/05/2020 04/21/2020 10/20/2019  Decreased Interest 0 0 0  Down, Depressed, Hopeless 0 0 0  PHQ - 2 Score 0 0 0  Altered sleeping - - -  Tired, decreased energy - - -  Change in appetite - - -  Feeling bad or failure about yourself  - - -  Trouble concentrating - - -  Moving slowly or fidgety/restless - - -  Suicidal thoughts - - -  PHQ-9 Score - - -    History Taylor Cross has a past medical history of Acute respiratory failure with hypoxia and hypercapnia ([place]) (28/63/8177), Acute systolic CHF (congestive heart failure) ([place]) (07/19/2020), Combined systolic and diastolic heart failure ([place]), and Hyperlipidemia.   He has a past surgical history that includes Tonsillectomy and Hernia repair.   His family history includes COPD in his father; Diabetes in his mother; Heart attack (age of onset: 45) in his brother; Heart disease in his mother; Heart failure in his mother.He reports that he has never smoked. He has never used smokeless tobacco. He reports that he does not drink alcohol and does not use drugs.    ROS Review of Systems  Constitutional: Negative.   HENT: Negative.   Eyes: Negative for visual disturbance.  Respiratory: Negative for cough and shortness of breath.   Cardiovascular: Negative for chest  pain and leg swelling.  Gastrointestinal: Negative for abdominal pain, diarrhea, nausea and vomiting.  Genitourinary: Negative for difficulty urinating.  Musculoskeletal: Negative for arthralgias and myalgias.  Skin: Negative for rash.  Neurological: Negative for headaches.  Psychiatric/Behavioral: Negative for sleep disturbance.    Objective:  BP 133/81   Pulse 62   Temp (!) 97.1 F (36.2 C) (Temporal)   Resp 18   Ht 5' 8"  (1.727 m)   Wt 178 lb 6.4 oz (80.9 kg)   SpO2 98%   BMI 27.13 kg/m   BP Readings from Last 3 Encounters:  08/05/20 133/81  08/03/20 100/60  07/22/20 136/72    Wt Readings from Last 3 Encounters:  08/05/20 178 lb 6.4 oz (80.9 kg)  08/03/20 179 lb (81.2 kg)  07/22/20 174 lb 2.6 oz (79 kg)     Physical Exam Vitals reviewed.  Constitutional:      Appearance: He is well-developed and well-nourished.  HENT:     Head: Normocephalic and atraumatic.     Right Ear: Tympanic membrane and external ear normal. No decreased hearing noted.     Left Ear: Tympanic membrane and external ear normal. No decreased hearing noted.     Mouth/Throat:     Pharynx: No oropharyngeal exudate or posterior oropharyngeal erythema.  Eyes:     Pupils: Pupils are equal, round, and reactive to light.  Cardiovascular:     Rate  and Rhythm: Normal rate and regular rhythm.     Heart sounds: No murmur heard.   Pulmonary:     Effort: No respiratory distress.     Breath sounds: Normal breath sounds.  Abdominal:     General: Bowel sounds are normal.     Palpations: Abdomen is soft. There is no mass.     Tenderness: There is no abdominal tenderness.  Musculoskeletal:     Cervical back: Normal range of motion and neck supple.       Assessment & Plan:   Taylor Cross was seen today for hospitalization follow-up.  Diagnoses and all orders for this visit:  Chronic combined systolic and diastolic congestive heart failure ([place]) -     DG Chest 2 View; Future -     CBC with  Differential/Platelet -     CMP14+EGFR  Other emphysema ([place]) -     DG Chest 2 View; Future -     CBC with Differential/Platelet -     CMP14+EGFR  Essential hypertension -     CMP14+EGFR       I am having Taylor Cross maintain his hydroquinone, mupirocin ointment, finasteride, gabapentin, pantoprazole, pravastatin, sertraline, tamsulosin, traMADol, loratadine, fluticasone, allopurinol, aspirin, guaiFENesin, guaiFENesin-dextromethorphan, acetaminophen, albuterol, potassium chloride, furosemide, carvedilol, and EQ Aspirin Adult Low Dose.  Allergies as of 08/05/2020      Reactions   Neosporin [neomycin-bacitracin Zn-polymyx] Rash   Voltaren [diclofenac Sodium] Other (See Comments)   GI upset      Medication List       Accurate as of August 05, 2020  5:12 PM. If you have any questions, ask your nurse or doctor.        acetaminophen 325 MG tablet Commonly known as: TYLENOL Take 2 tablets (650 mg total) by mouth every 6 (six) hours as needed for mild pain, fever or headache (or Fever >/= 101).   albuterol 108 (90 Base) MCG/ACT inhaler Commonly known as: VENTOLIN HFA Inhale 2 puffs into the lungs every 4 (four) hours as needed for wheezing or shortness of breath.   allopurinol 100 MG tablet Commonly known as: ZYLOPRIM TAKE 1 TABLET DAILY   aspirin 81 MG chewable tablet Chew 1 tablet (81 mg total) by mouth daily with breakfast.   EQ Aspirin Adult Low Dose 81 MG EC tablet Generic drug: aspirin Take 81 mg by mouth daily.   carvedilol 25 MG tablet Commonly known as: COREG Take 1 tablet (25 mg total) by mouth 2 (two) times daily with a meal.   finasteride 5 MG tablet Commonly known as: PROSCAR Take 1 tablet (5 mg total) by mouth daily.   fluticasone 50 MCG/ACT nasal spray Commonly known as: FLONASE USE 2 SPRAYS IN EACH NOSTRIL ONCE DAILY.   furosemide 20 MG tablet Commonly known as: LASIX Take 1 tablet (20 mg total) by mouth daily.   gabapentin 300 MG  capsule Commonly known as: NEURONTIN TAKE  (1)  CAPSULE  TWICE DAILY. What changed:   how much to take  how to take this  when to take this  additional instructions   guaiFENesin 600 MG 12 hr tablet Commonly known as: MUCINEX Take 1 tablet (600 mg total) by mouth 2 (two) times daily.   guaiFENesin-dextromethorphan 100-10 MG/5ML syrup Commonly known as: ROBITUSSIN DM Take 5 mLs by mouth every 4 (four) hours as needed for cough.   hydroquinone 4 % cream APPLY TO AFFECTED AREAS TWICE A DAY What changed: See the new instructions.   loratadine 10  MG tablet Commonly known as: CLARITIN TAKE ONE TABLET AT BEDTIME What changed: when to take this   mupirocin ointment 2 % Commonly known as: BACTROBAN Apply 2 (two) times daily. To affected areas What changed:   how much to take  how to take this  when to take this  additional instructions   pantoprazole 40 MG tablet Commonly known as: PROTONIX TAKE 1 TABLET ONCE DAILY FOR REFLUX What changed:   how much to take  how to take this  when to take this  additional instructions   potassium chloride 10 MEQ tablet Commonly known as: KLOR-CON Take 1 tablet (10 mEq total) by mouth daily. Take While taking Lasix/furosemide   pravastatin 40 MG tablet Commonly known as: PRAVACHOL Take 1 tablet (40 mg total) by mouth daily.   sertraline 50 MG tablet Commonly known as: ZOLOFT Take 1 tablet (50 mg total) by mouth daily.   tamsulosin 0.4 MG Caps capsule Commonly known as: FLOMAX TAKE (2) CAPSULES DAILY. What changed:   how much to take  how to take this  when to take this  additional instructions   traMADol 50 MG tablet Commonly known as: ULTRAM Take 1 tablet (50 mg total) by mouth 2 (two) times daily. For back pain        Follow-up: No follow-ups on file.  Claretta Fraise, M.D.

## 2020-08-06 LAB — CMP14+EGFR
ALT: 10 IU/L (ref 0–44)
AST: 17 IU/L (ref 0–40)
Albumin/Globulin Ratio: 1.7 (ref 1.2–2.2)
Albumin: 4.2 g/dL (ref 3.7–4.7)
Alkaline Phosphatase: 76 IU/L (ref 44–121)
BUN/Creatinine Ratio: 21 (ref 10–24)
BUN: 34 mg/dL — ABNORMAL HIGH (ref 8–27)
Bilirubin Total: 0.2 mg/dL (ref 0.0–1.2)
CO2: 27 mmol/L (ref 20–29)
Calcium: 9.1 mg/dL (ref 8.6–10.2)
Chloride: 98 mmol/L (ref 96–106)
Creatinine, Ser: 1.63 mg/dL — ABNORMAL HIGH (ref 0.76–1.27)
GFR calc Af Amer: 47 mL/min/{1.73_m2} — ABNORMAL LOW (ref >59)
GFR calc non Af Amer: 41 mL/min/{1.73_m2} — ABNORMAL LOW (ref >59)
Globulin, Total: 2.5 g/dL (ref 1.5–4.5)
Glucose: 91 mg/dL (ref 65–99)
Potassium: 4.8 mmol/L (ref 3.5–5.2)
Sodium: 136 mmol/L (ref 134–144)
Total Protein: 6.7 g/dL (ref 6.0–8.5)

## 2020-08-06 LAB — CBC WITH DIFFERENTIAL/PLATELET
Basophils Absolute: 0 10*3/uL (ref 0.0–0.2)
Basos: 1 %
EOS (ABSOLUTE): 0.3 10*3/uL (ref 0.0–0.4)
Eos: 5 %
Hematocrit: 37.7 % (ref 37.5–51.0)
Hemoglobin: 12.3 g/dL — ABNORMAL LOW (ref 13.0–17.7)
Immature Grans (Abs): 0 10*3/uL (ref 0.0–0.1)
Immature Granulocytes: 1 %
Lymphocytes Absolute: 1.7 10*3/uL (ref 0.7–3.1)
Lymphs: 28 %
MCH: 29 pg (ref 26.6–33.0)
MCHC: 32.6 g/dL (ref 31.5–35.7)
MCV: 89 fL (ref 79–97)
Monocytes Absolute: 0.6 10*3/uL (ref 0.1–0.9)
Monocytes: 10 %
Neutrophils Absolute: 3.4 10*3/uL (ref 1.4–7.0)
Neutrophils: 55 %
Platelets: 206 10*3/uL (ref 150–450)
RBC: 4.24 x10E6/uL (ref 4.14–5.80)
RDW: 14.9 % (ref 11.6–15.4)
WBC: 5.9 10*3/uL (ref 3.4–10.8)

## 2020-08-15 ENCOUNTER — Telehealth: Payer: Self-pay | Admitting: *Deleted

## 2020-08-15 NOTE — Telephone Encounter (Signed)
08/05/2020   Chronic Care Management   Outreach Note  08/15/2020 Name: Taylor Cross MRN: [id number] DOB: January 12, 1946  Referred by: [name], MD Reason for referral : No chief complaint on file.   An unsuccessful follow-up Telephone Visit was attempted today. The patient was referred to the case management team for assistance with care management and care coordination.   Clinical Goals: . Over the next 30 days, patient will be contacted by a Care Guide to reschedule their CCM Visit  Interventions and Plan . Chart reviewed in preparation for telephone visit . Collaboration with other care team members as needed . A HIPAA compliant phone message was left for the patient providing contact information and requesting a return call.  . Request sent to care guides to reach out and reschedule patient's telephone visit   Demetrios Loll, BSN, RN-BC Embedded Chronic Care Manager [facility] Care Management Direct Dial: [phone]

## 2020-08-17 NOTE — Telephone Encounter (Signed)
R/s

## 2020-08-17 NOTE — Telephone Encounter (Signed)
Spoke to patient explained reason for call and rescheduled for initial call with RNCM on 09/02/2019  [facility] Guide, Embedded Care Coordination [facility] Management

## 2020-09-01 ENCOUNTER — Ambulatory Visit (INDEPENDENT_AMBULATORY_CARE_PROVIDER_SITE_OTHER): Payer: Medicare Other | Admitting: *Deleted

## 2020-09-01 ENCOUNTER — Encounter: Payer: Self-pay | Admitting: *Deleted

## 2020-09-01 DIAGNOSIS — I10 Essential (primary) hypertension: Secondary | ICD-10-CM | POA: Diagnosis not present

## 2020-09-01 DIAGNOSIS — I5042 Chronic combined systolic (congestive) and diastolic (congestive) heart failure: Secondary | ICD-10-CM

## 2020-09-01 NOTE — Chronic Care Management (AMB) (Signed)
Chronic Care Management   CCM RN Visit Note  09/01/2020 Name: Taylor Cross MRN: [id number] DOB: March 26, 1946  Subjective: Taylor Cross is a 75 y.o. year old male who is a primary care patient of [name], Cletus Gash, MD. The care management team was consulted for assistance with disease management and care coordination needs.    Engaged with patient by telephone for follow up visit in response to provider referral for case management and/or care coordination services.   Consent to Services:  The patient was given the following information about Chronic Care Management services today, agreed to services, and gave verbal consent: 1. CCM service includes personalized support from designated clinical staff supervised by the primary care provider, including individualized plan of care and coordination with other care providers 2. 24/7 contact phone numbers for assistance for urgent and routine care needs. 3. Service will only be billed when office clinical staff spend 20 minutes or more in a month to coordinate care. 4. Only one practitioner may furnish and bill the service in a calendar month. 5.The patient may stop CCM services at any time (effective at the end of the month) by phone call to the office staff. 6. The patient will be responsible for cost sharing (co-pay) of up to 20% of the service fee (after annual deductible is met). Patient agreed to services and consent obtained.  Patient agreed to services and verbal consent obtained.   Assessment: Review of patient past medical history, allergies, medications, health status, including review of consultants reports, laboratory and other test data, was performed as part of comprehensive evaluation and provision of chronic care management services.   SDOH (Social Determinants of Health) assessments and interventions performed:    CCM Care Plan  Allergies  Allergen Reactions  . Neosporin [Neomycin-Bacitracin Zn-Polymyx] Rash  . Voltaren [Diclofenac  Sodium] Other (See Comments)    GI upset    Outpatient Encounter Medications as of 09/01/2020  Medication Sig  . acetaminophen (TYLENOL) 325 MG tablet Take 2 tablets (650 mg total) by mouth every 6 (six) hours as needed for mild pain, fever or headache (or Fever >/= 101).  Marland Kitchen albuterol (VENTOLIN HFA) 108 (90 Base) MCG/ACT inhaler Inhale 2 puffs into the lungs every 4 (four) hours as needed for wheezing or shortness of breath.  . allopurinol (ZYLOPRIM) 100 MG tablet TAKE 1 TABLET DAILY (Patient taking differently: Take 100 mg by mouth daily.)  . aspirin 81 MG chewable tablet Chew 1 tablet (81 mg total) by mouth daily with breakfast.  . carvedilol (COREG) 25 MG tablet Take 1 tablet (25 mg total) by mouth 2 (two) times daily with a meal.  . EQ ASPIRIN ADULT LOW DOSE 81 MG EC tablet Take 81 mg by mouth daily.  . finasteride (PROSCAR) 5 MG tablet Take 1 tablet (5 mg total) by mouth daily.  . fluticasone (FLONASE) 50 MCG/ACT nasal spray USE 2 SPRAYS IN EACH NOSTRIL ONCE DAILY. (Patient taking differently: Place 2 sprays into both nostrils daily.)  . furosemide (LASIX) 20 MG tablet Take 1 tablet (20 mg total) by mouth daily.  Marland Kitchen gabapentin (NEURONTIN) 300 MG capsule TAKE  (1)  CAPSULE  TWICE DAILY. (Patient taking differently: Take 300 mg by mouth 2 (two) times daily.)  . guaiFENesin (MUCINEX) 600 MG 12 hr tablet Take 1 tablet (600 mg total) by mouth 2 (two) times daily.  Marland Kitchen guaiFENesin-dextromethorphan (ROBITUSSIN DM) 100-10 MG/5ML syrup Take 5 mLs by mouth every 4 (four) hours as needed for cough.  . hydroquinone  4 % cream APPLY TO AFFECTED AREAS TWICE A DAY (Patient taking differently: Apply 1 application topically in the morning and at bedtime.)  . loratadine (CLARITIN) 10 MG tablet TAKE ONE TABLET AT BEDTIME (Patient taking differently: Take 10 mg by mouth at bedtime.)  . mupirocin ointment (BACTROBAN) 2 % Apply 2 (two) times daily. To affected areas (Patient taking differently: Apply 1 application  topically 2 (two) times daily. To affected areas)  . pantoprazole (PROTONIX) 40 MG tablet TAKE 1 TABLET ONCE DAILY FOR REFLUX (Patient taking differently: Take 40 mg by mouth daily.)  . potassium chloride (KLOR-CON) 10 MEQ tablet Take 1 tablet (10 mEq total) by mouth daily. Take While taking Lasix/furosemide  . pravastatin (PRAVACHOL) 40 MG tablet Take 1 tablet (40 mg total) by mouth daily.  . sertraline (ZOLOFT) 50 MG tablet Take 1 tablet (50 mg total) by mouth daily.  . tamsulosin (FLOMAX) 0.4 MG CAPS capsule TAKE (2) CAPSULES DAILY. (Patient taking differently: Take 0.8 mg by mouth daily.)  . traMADol (ULTRAM) 50 MG tablet Take 1 tablet (50 mg total) by mouth 2 (two) times daily. For back pain   No facility-administered encounter medications on file as of 09/01/2020.    Patient Active Problem List   Diagnosis Date Noted  . Benign prostatic hyperplasia with nocturia 10/20/2019  . Spondylosis of lumbar region without myelopathy or radiculopathy 11/10/2017  . Gastroesophageal reflux disease with esophagitis 06/11/2017  . Hernia of abdominal cavity 08/16/2015  . Essential hypertension 08/04/2015  . Inguinal hernia 08/04/2015  . Snoring 02/04/2014  . Emphysema lung ([place]) 12/02/2013  . Systolic dysfunction 02/72/5366  . Diastolic dysfunction 44/09/4740  . PVC's (premature ventricular contractions) 10/20/2013  . Mixed hyperlipidemia 10/20/2013    Conditions to be addressed/monitored:CHF, HTN and COPD  Care Plan : RNCM: Hypertension (Adult)  Updates made by Ilean China, RN since 09/01/2020 12:00 AM    Problem: Hypertension (Hypertension)   Priority: Medium    Long-Range Goal: Hypertension Management   This Visit's Progress: Not on track  Priority: Medium  Note:   Current Barriers:  Marland Kitchen Knowledge Deficits related to prevention of orthostatic hypotension . Knowledge deficits related to need for record home blood pressure readings . Chronic Disease Management support and education  needs related to hypertension in a patient with heart failure  Nurse Case Manager Clinical Goal(s):  Marland Kitchen Over the next 30 days, patient will verbalize understanding of plan for hypertension management . Over the next 30 days, patient will work with Consulting civil engineer to address needs related to self-management of hypertension . Over the next 30 days, patient will demonstrate improved self-care management as evidenced by checking and recording blood pressure twice daily and as needed  Interventions:  . 1:1 collaboration with Claretta Fraise, MD regarding development and update of comprehensive plan of care as evidenced by provider attestation and co-signature . Inter-disciplinary care team collaboration (see longitudinal plan of care) . Evaluation of current treatment plan related to hypertension and patient's adherence to plan as established by provider. . Chart reviewed including relevant office notes, lab results, and hospital reports . Reviewed and discussed medications: carvedilol 60m one tablet in the morning and two in the evening o Explained effect of carvedilol on blood pressure and heart rate . Discussed home blood pressure monitoring o Patient is monitoring blood pressure one or twice a day but not recording it  o Reports that readings are often low around 90/55 and highs are around 120-130/70-75 o Pulse rate is averaging 65-70 .  Discussed symptoms of orthostatic hypotension o Patient feels lightheaded and weak upon standing . Education provided on orthostatic hypotension and how to avoid it o Stand up slowly and wait until he feels more balanced and less lightheaded before he starts walking o Encouraged patient to use cane or assistive device when feeling lightheaded . Reviewed and discussed upcoming cardiology appointment for Echo and cardiology follow-up . Advised patient to continue checking blood pressure twice daily and PRN and to write down the date, time, blood pressure reading,  and pulse and to take that with him to his cardiology appointment . Advised to call cardiology if his readings are any lower than his average now or if he has any new or worsening symptoms . Discussed overall feeling of well-being o Reports that he "feels good most of the time and energy is getting better" . Provided with RN Care Manager contact number and encouraged to reach out as needed  Patient Goals/Self-Care Activities Over the next 30 days, patient will: .  Take blood pressure twice a day and as needed . Record blood pressure, pulse, and time of day in a log . Take log to visit with cardiologist  . Call cardiologist with any worsening symptoms of lightheadedness or dizziness . Call cardiologist if you blood pressure is running lower or higher than it has been this week . Call RN Care Manager as needed . Take medications as prescribed . Keep appointment for echocardiogram and follow-up appointment with cardiologist     Follow Up Plan:  Telephone follow up appointment with care management team member scheduled for: 09/29/20 with RN Care Manager The patient has been provided with contact information for the care management team and has been advised to call with any health related questions or concerns.  Next PCP appointment scheduled for: 10/19/20 with Dr Livia Snellen Echo appointment scheduled: 09/07/20 Cardiology appointment scheduled for: 09/22/20  Chong Sicilian, BSN, RN-BC [place] [facility] [place] Management Direct Dial: [phone]

## 2020-09-01 NOTE — Patient Instructions (Signed)
Visit Information  PATIENT GOALS: Goals Addressed            This Visit's Progress   . Hypertension Management       Timeframe:  Long-Range Goal Priority:  Medium Start Date: 09/01/20                            Expected End Date: 03/01/21                      Follow-up:   . Take blood pressure twice a day and as needed . Record blood pressure, pulse, and time of day in a log . Take log to visit with cardiologist  . Call cardiologist with any worsening symptoms of lightheadedness or dizziness . Call cardiologist if you blood pressure is running lower or higher than it has been this week . Call RN Care Manager as needed . Take medications as prescribed . Keep appointment for echocardiogram and follow-up appointment with cardiologist       Patient verbalizes understanding of instructions provided today and agrees to view in MyChart.   Follow Up Plan:  Telephone follow up appointment with care management team member scheduled for: 09/29/20 with RN Care Manager The patient has been provided with contact information for the care management team and has been advised to call with any health related questions or concerns.  Next PCP appointment scheduled for: 10/19/20 with Dr Darlyn Read Echo appointment scheduled: 09/07/20 Cardiology appointment scheduled for: 09/22/20  Demetrios Loll, BSN, RN-BC Embedded Chronic Care Manager [facility] Care Management Direct Dial: [phone]

## 2020-09-07 ENCOUNTER — Ambulatory Visit (INDEPENDENT_AMBULATORY_CARE_PROVIDER_SITE_OTHER): Payer: Medicare Other

## 2020-09-07 DIAGNOSIS — I5022 Chronic systolic (congestive) heart failure: Secondary | ICD-10-CM

## 2020-09-07 LAB — ECHOCARDIOGRAM COMPLETE
Calc EF: 42.6 %
MV M vel: 2.98 m/s
MV Peak grad: 35.5 mmHg
S' Lateral: 4.16 cm
Single Plane A2C EF: 40.9 %
Single Plane A4C EF: 43.3 %

## 2020-09-21 ENCOUNTER — Ambulatory Visit: Payer: Medicare Other | Admitting: *Deleted

## 2020-09-21 ENCOUNTER — Telehealth: Payer: Self-pay | Admitting: Cardiology

## 2020-09-21 DIAGNOSIS — I10 Essential (primary) hypertension: Secondary | ICD-10-CM

## 2020-09-21 DIAGNOSIS — I5021 Acute systolic (congestive) heart failure: Secondary | ICD-10-CM

## 2020-09-21 MED ORDER — FUROSEMIDE 20 MG PO TABS: 20.0000 mg | ORAL_TABLET | Freq: Every day | ORAL | 1 refills | Status: DC

## 2020-09-21 NOTE — Progress Notes (Incomplete)
Cardiology Office Note  Date: 09/21/2020   ID: ZEKI BEDROSIAN, DOB [birth year], MRN [id number]  PCP:  Claretta Fraise, MD  Cardiologist:  Carlyle Dolly, MD Electrophysiologist:  None   Chief Complaint: Cardiac follow up  History of Present Illness: Taylor Cross is a 75 y.o. male with a history of  Chronic combined systolic and diastolic HF, Respiratory failure, HLD, CKD 3.  Echo 2015, LVEF 40 to 45%, mid inferolateral wall hypokinesis, grade 2 DD. Echo 2016, LVEF 35 to 40%, basal to mid inferior lateral and inferior walls.  Grade 1 DD. Lexiscan stress: Moderate size moderate to severe intensity inferolateral wall defect, small apical to mid inferior wall defect.  Both areas fixed and consistent with scar. Echo 2020 LVEF 50 to 55%. Echo 07/20/2020 EF decreased to 35 to 40% from 50 to 55% in 2020. Previously lisinopril stopped due to worsening renal function. Creatinine improved off ACE inhibitor     Past Medical History:  Diagnosis Date  . Acute respiratory failure with hypoxia and hypercapnia ([place]) 07/19/2020  . Acute systolic CHF (congestive heart failure) ([place]) 07/19/2020  . Combined systolic and diastolic heart failure ([place])   . Hyperlipidemia     Past Surgical History:  Procedure Laterality Date  . HERNIA REPAIR    . TONSILLECTOMY      Current Outpatient Medications  Medication Sig Dispense Refill  . acetaminophen (TYLENOL) 325 MG tablet Take 2 tablets (650 mg total) by mouth every 6 (six) hours as needed for mild pain, fever or headache (or Fever >/= 101). 12 tablet 0  . albuterol (VENTOLIN HFA) 108 (90 Base) MCG/ACT inhaler Inhale 2 puffs into the lungs every 4 (four) hours as needed for wheezing or shortness of breath. 18 g 2  . allopurinol (ZYLOPRIM) 100 MG tablet TAKE 1 TABLET DAILY (Patient taking differently: Take 100 mg by mouth daily.) 30 tablet 3  . aspirin 81 MG chewable tablet Chew 1 tablet (81 mg total) by mouth daily with breakfast. 120 tablet 3  .  carvedilol (COREG) 25 MG tablet Take 1 tablet (25 mg total) by mouth 2 (two) times daily with a meal. 180 tablet 3  . EQ ASPIRIN ADULT LOW DOSE 81 MG EC tablet Take 81 mg by mouth daily.    . finasteride (PROSCAR) 5 MG tablet Take 1 tablet (5 mg total) by mouth daily. 90 tablet 1  . fluticasone (FLONASE) 50 MCG/ACT nasal spray USE 2 SPRAYS IN EACH NOSTRIL ONCE DAILY. (Patient taking differently: Place 2 sprays into both nostrils daily.) 16 g 5  . furosemide (LASIX) 20 MG tablet Take 1 tablet (20 mg total) by mouth daily. 90 tablet 1  . gabapentin (NEURONTIN) 300 MG capsule TAKE  (1)  CAPSULE  TWICE DAILY. (Patient taking differently: Take 300 mg by mouth 2 (two) times daily.) 180 capsule 1  . guaiFENesin (MUCINEX) 600 MG 12 hr tablet Take 1 tablet (600 mg total) by mouth 2 (two) times daily. 20 tablet 0  . guaiFENesin-dextromethorphan (ROBITUSSIN DM) 100-10 MG/5ML syrup Take 5 mLs by mouth every 4 (four) hours as needed for cough. 118 mL 0  . hydroquinone 4 % cream APPLY TO AFFECTED AREAS TWICE A DAY (Patient taking differently: Apply 1 application topically in the morning and at bedtime.) 28.35 g 0  . loratadine (CLARITIN) 10 MG tablet TAKE ONE TABLET AT BEDTIME (Patient taking differently: Take 10 mg by mouth at bedtime.) 90 tablet 1  . mupirocin ointment (BACTROBAN) 2 % Apply 2 (two)  times daily. To affected areas (Patient taking differently: Apply 1 application topically 2 (two) times daily. To affected areas) 22 g 2  . pantoprazole (PROTONIX) 40 MG tablet TAKE 1 TABLET ONCE DAILY FOR REFLUX (Patient taking differently: Take 40 mg by mouth daily.) 90 tablet 1  . potassium chloride (KLOR-CON) 10 MEQ tablet Take 1 tablet (10 mEq total) by mouth daily. Take While taking Lasix/furosemide 30 tablet 2  . pravastatin (PRAVACHOL) 40 MG tablet Take 1 tablet (40 mg total) by mouth daily. 90 tablet 1  . sertraline (ZOLOFT) 50 MG tablet Take 1 tablet (50 mg total) by mouth daily. 90 tablet 1  . tamsulosin  (FLOMAX) 0.4 MG CAPS capsule TAKE (2) CAPSULES DAILY. (Patient taking differently: Take 0.8 mg by mouth daily.) 180 capsule 1  . traMADol (ULTRAM) 50 MG tablet Take 1 tablet (50 mg total) by mouth 2 (two) times daily. For back pain 60 tablet 5   No current facility-administered medications for this visit.   Allergies:  Neosporin [neomycin-bacitracin zn-polymyx] and Voltaren [diclofenac sodium]   Social History: The patient  reports that he has never smoked. He has never used smokeless tobacco. He reports that he does not drink alcohol and does not use drugs.   Family History: The patient's family history includes COPD in his father; Diabetes in his mother; Heart attack (age of onset: 23) in his brother; Heart disease in his mother; Heart failure in his mother.   ROS:  Please see the history of present illness. Otherwise, complete review of systems is positive for {NONE DEFAULTED:18576::"none"}.  All other systems are reviewed and negative.   Physical Exam: VS:  There were no vitals taken for this visit., BMI There is no height or weight on file to calculate BMI.  Wt Readings from Last 3 Encounters:  08/05/20 178 lb 6.4 oz (80.9 kg)  08/03/20 179 lb (81.2 kg)  07/22/20 174 lb 2.6 oz (79 kg)    General: Patient appears comfortable at rest. HEENT: Conjunctiva and lids normal, oropharynx clear with moist mucosa. Neck: Supple, no elevated JVP or carotid bruits, no thyromegaly. Lungs: Clear to auscultation, nonlabored breathing at rest. Cardiac: Regular rate and rhythm, no S3 or significant systolic murmur, no pericardial rub. Abdomen: Soft, nontender, no hepatomegaly, bowel sounds present, no guarding or rebound. Extremities: No pitting edema, distal pulses 2+. Skin: Warm and dry. Musculoskeletal: No kyphosis. Neuropsychiatric: Alert and oriented x3, affect grossly appropriate.  ECG:  {EKG/Telemetry Strips Reviewed:[phone]}  Recent Labwork: 07/19/2020: B Natriuretic Peptide  913.0 08/05/2020: ALT 10; AST 17; BUN 34; Creatinine, Ser 1.63; Hemoglobin 12.3; Platelets 206; Potassium 4.8; Sodium 136     Component Value Date/Time   CHOL 158 04/21/2020 1034   TRIG 57 07/19/2020 0742   TRIG 143 09/30/2014 0845   HDL 53 04/21/2020 1034   HDL 49 09/30/2014 0845   CHOLHDL 3.0 04/21/2020 1034   LDLCALC 90 04/21/2020 1034    Other Studies Reviewed Today:   Echocardiogram 09/07/2020 1. Left ventricular ejection fraction, by estimation, is 40 to 45%. The left ventricle has mildly decreased function. The left ventricle demonstrates global hypokinesis. There is mild left ventricular hypertrophy. Left ventricular diastolic parameters are consistent with Grade I diastolic dysfunction (impaired relaxation). 2. Right ventricular systolic function is normal. The right ventricular size is normal. 3. Left atrial size was severely dilated. 4. The mitral valve is normal in structure. Trivial mitral valve regurgitation. No evidence of mitral stenosis. 5. The aortic valve is tricuspid. There is mild calcification  of the aortic valve. There is mild thickening of the aortic valve. Aortic valve regurgitation is not visualized. No aortic stenosis is present. 6. The inferior vena cava is normal in size with greater than 50% respiratory variability, suggesting right atrial pressure of 3 mmHg. Comparison(s): Echocardiogram done 07/20/20 showed an EF of 35-40%.     Diagnostic Studies 10/2013 Echo Left ventricle: The cavity size was mildly dilated. Wall thickness was normal. Systolic function was mildly to moderately reduced. The estimated ejection fraction is 40%. Features are consistent with a pseudonormal left ventricular filling pattern, with concomitant abnormal relaxation and increased filling pressure (grade 2 diastolic dysfunction). Doppler parameters are consistent with high ventricular filling pressure. - Regional wall motion abnormality: Mild to moderate hypokinesis of  the mid inferolateral myocardium. The remaining walls are mildly hypokinetic. - Aortic valve: Trileaflet; mildly thickened, mildly calcified leaflets. There was no stenosis. - Mitral valve: Mildly thickened leaflets . Mild to moderate regurgitation. - Left atrium: The atrium was moderately dilated. - Tricuspid valve: Mild regurgitation. Inadequate TR jet to accurately assess pulmonary pressures. - Pericardium, extracardiac: There was a left pleural effusion.  11/11/13 Lexiscan MPI Gated imaging reveals an EDV of 223, ESV of 153, TID ratio 0.99, and LVEF of 31% with global hypokinesis, most prominent in the apex and inferolateral walls.  IMPRESSION: Intermediate risk Lexiscan Cardiolite. No diagnostic ST segment changes were noted to indicate ischemia, frequent PVCs were seen without sustained arrhythmia. Perfusion imaging is most consistent with scar affecting the inferior wall and inferolateral wall as outlined, no large ischemic zones however. LVEF is calculated at 31% with global hypokinesis that is most prominent at the apex and inferior wall. EDV indicates moderate to severe chamber dilatation. Findings are consistent with probable ischemic cardiomyopathy.   08/2014 echo Study Conclusions - Left ventricle: The cavity size was mildly dilated. Wall thickness was increased in a pattern of moderate LVH. Systolic function was moderately reduced. The estimated ejection fraction was in the range of 35% to 40%. There is hypokinesis to akinesis of the basal-mid inferolateral and inferior myocardium. Doppler parameters are consistent with abnormal left ventricular relaxation (grade 1 diastolic dysfunction). - Aortic valve: Mildly to moderately calcified annulus. Trileaflet. - Mitral valve: Mildly calcified annulus. There was mild regurgitation. - Left atrium: The atrium was mildly to moderately dilated. - Right atrium: Central venous pressure (est): 3 mm Hg. -  Atrial septum: Possible PFO or secundum ASD with apparent left to right flow noted by color Doppler. Cannot exclude vigorous venous return adjacent to septum. Agitated saline study could be considered for further evaluation. - Tricuspid valve: There was trivial regurgitation. - Pulmonary arteries: PA peak pressure: 17 mm Hg (S). - Pericardium, extracardiac: There was no pericardial effusion.  Recommendations: Moderate LVH with mild LV chamber dilatation and LVEF approximately 35-40%. Hypokinesis to akinesis of the mid to basal inferolateral/inferior walls noted suggestive of ischemic cardiomyopathy. Grade 1 diastolic dysfunction. Compared to prior study in April 2015, wall motion has improved somewhat and ejection fraction looks to be higher, although graded in similar range last time. Mild to moderate left atrial enlargement. Mild MAC with mild mitral regurgitation. Possible PFO or secundum ASD with apparent left to right flow noted by color Doppler. Cannot exclude vigorous venous return adjacent to septum. Agitated saline study could be considered for further evaluation. Trivial tricuspid regurgitation with PASP normal range.       12/2018 echo 1. The left ventricle has low normal systolic function, with an ejection fraction of 50-55%.  The cavity size was normal. There is mildly increased left ventricular wall thickness. Left ventricular diastolic Doppler parameters are consistent with  impaired relaxation. Elevated mean left atrial pressure. 2. The right ventricle has normal systolic function. The cavity was normal. There is no increase in right ventricular wall thickness. 3. Left atrial size was moderately dilated. 4. No evidence of mitral valve stenosis. 5. The aortic valve is tricuspid. No stenosis of the aortic valve. 6. The aortic root is normal in size and structure. 7. Pulmonary hypertension is indeterminant, inadequate TR jet.   06/2020  echo IMPRESSIONS    1. Left ventricular ejection fraction, by estimation, is 35 to 40%. The  left ventricle has moderately decreased function. The left ventricle  demonstrates regional wall motion abnormalities (see scoring  diagram/findings for description). There is mild  left ventricular hypertrophy. Left ventricular diastolic parameters are  indeterminate.  2. Right ventricular systolic function is normal. The right ventricular  size is normal. Tricuspid regurgitation signal is inadequate for assessing  PA pressure.  3. Left atrial size was mild to moderately dilated.  4. The mitral valve is grossly normal. Mild mitral valve regurgitation.  5. The aortic valve is tricuspid. There is moderate calcification of the  aortic valve. Aortic valve regurgitation is not visualized. Mild aortic  valve stenosis. Aortic valve mean gradient measures 10.0 mmHg.  6. The inferior vena cava is normal in size with greater than 50%  respiratory variability, suggesting right atrial pressure of 3 mmHg.  7. Left pleural effusion is noted.    Assessment and Plan:  1. Chronic systolic heart failure (HCC)   2. Stage 3 chronic kidney disease, unspecified whether stage 3a or 3b CKD (HCC)      Medication Adjustments/Labs and Tests Ordered: Current medicines are reviewed at length with the patient today.  Concerns regarding medicines are outlined above.   Disposition: Follow-up with ***  Signed, Levell July, NP 09/21/2020 11:47 PM    [place] [facility] [address], [place], [place] [address] Phone: [phone]; Fax: [phone]

## 2020-09-21 NOTE — Progress Notes (Addendum)
Cardiology Office Note  Date: 09/22/2020   ID: Taylor Cross, DOB 03-22-1946, MRN [id number]  PCP:  Claretta Fraise, MD  Cardiologist:  Carlyle Dolly, MD Electrophysiologist:  None   Chief Complaint: Cardiac follow up  History of Present Illness: Taylor Cross is a 75 y.o. male with a history of  Chronic combined systolic and diastolic HF, Respiratory failure, HLD, CKD 3.  Echo 2015, LVEF 40 to 45%, mid inferolateral wall hypokinesis, grade 2 DD. Echo 2016, LVEF 35 to 40%, basal to mid inferior lateral and inferior walls.  Grade 1 DD. Lexiscan stress: Moderate size moderate to severe intensity inferolateral wall defect, small apical to mid inferior wall defect.  Both areas fixed and consistent with scar. Echo 2020 LVEF 50 to 55%. Admit for CHF / pneumonia Echo 07/20/2020  EF decreased to 35 to 40% from 50 to 55% in 2020. Previously lisinopril stopped due to worsening renal function. Creatinine improved off ACE inhibitor  Last encounter with Dr. Harl Bowie 08/03/2020.  Medical therapy had been limited due to orthostatic dizziness, renal dysfunction.  LVEF had normalized but now with recurrent dysfunction #3 during recent admission with pneumonia/CHF.  Continue medical therapy.  Repeat echo in 1 month.  If ongoing dysfunction may need to consider ischemic evaluation.  History of CKD, prior AKI on ACE.  Had not been on ACE, ARB, Arni.  May consider low-dose Aldactone pending blood pressures and follow-up labs.  Systolic blood pressure was 100.  With orthostatic symptoms lower Lasix to 20 mg daily.  Continue KCl dose for now until labs return.  He is here today for follow-up on recent echocardiogram.  States he is feeling much better.  States actually feeling much better than it did a year ago.  He denies any anginal or exertional symptoms.  We reviewed the results of the echocardiogram and discussed the fact that his ejection fraction had increased approximately 10%.  Based on the fact that it is  possible we could do an ischemic work-up in the form of a stress test.  Patient states his blood pressures have been running low recently and brings a log of blood pressures with him today.  States some of the blood pressure systolic have been in the 80s since increasing dose of carvedilol to 25 mg daily.  Past Medical History:  Diagnosis Date  . Acute respiratory failure with hypoxia and hypercapnia ([place]) 07/19/2020  . Acute systolic CHF (congestive heart failure) ([place]) 07/19/2020  . Combined systolic and diastolic heart failure ([place])   . Hyperlipidemia     Past Surgical History:  Procedure Laterality Date  . HERNIA REPAIR    . TONSILLECTOMY      Current Outpatient Medications  Medication Sig Dispense Refill  . acetaminophen (TYLENOL) 325 MG tablet Take 2 tablets (650 mg total) by mouth every 6 (six) hours as needed for mild pain, fever or headache (or Fever >/= 101). 12 tablet 0  . albuterol (VENTOLIN HFA) 108 (90 Base) MCG/ACT inhaler Inhale 2 puffs into the lungs every 4 (four) hours as needed for wheezing or shortness of breath. 18 g 2  . allopurinol (ZYLOPRIM) 100 MG tablet TAKE 1 TABLET DAILY (Patient taking differently: Take 100 mg by mouth daily.) 30 tablet 3  . aspirin 81 MG chewable tablet Chew 1 tablet (81 mg total) by mouth daily with breakfast. 120 tablet 3  . carvedilol (COREG) 12.5 MG tablet Take 1 tablet (12.5 mg total) by mouth 2 (two) times daily. 180 tablet 3  .  EQ ASPIRIN ADULT LOW DOSE 81 MG EC tablet Take 81 mg by mouth daily.    . finasteride (PROSCAR) 5 MG tablet Take 1 tablet (5 mg total) by mouth daily. 90 tablet 1  . fluticasone (FLONASE) 50 MCG/ACT nasal spray USE 2 SPRAYS IN EACH NOSTRIL ONCE DAILY. (Patient taking differently: Place 2 sprays into both nostrils daily.) 16 g 5  . furosemide (LASIX) 20 MG tablet Take 1 tablet (20 mg total) by mouth daily. 90 tablet 1  . gabapentin (NEURONTIN) 300 MG capsule TAKE  (1)  CAPSULE  TWICE DAILY. (Patient taking  differently: Take 300 mg by mouth 2 (two) times daily.) 180 capsule 1  . guaiFENesin (MUCINEX) 600 MG 12 hr tablet Take 1 tablet (600 mg total) by mouth 2 (two) times daily. 20 tablet 0  . guaiFENesin-dextromethorphan (ROBITUSSIN DM) 100-10 MG/5ML syrup Take 5 mLs by mouth every 4 (four) hours as needed for cough. 118 mL 0  . hydroquinone 4 % cream APPLY TO AFFECTED AREAS TWICE A DAY (Patient taking differently: Apply 1 application topically in the morning and at bedtime.) 28.35 g 0  . loratadine (CLARITIN) 10 MG tablet TAKE ONE TABLET AT BEDTIME (Patient taking differently: Take 10 mg by mouth at bedtime.) 90 tablet 1  . mupirocin ointment (BACTROBAN) 2 % Apply 2 (two) times daily. To affected areas (Patient taking differently: Apply 1 application topically 2 (two) times daily. To affected areas) 22 g 2  . pantoprazole (PROTONIX) 40 MG tablet TAKE 1 TABLET ONCE DAILY FOR REFLUX (Patient taking differently: Take 40 mg by mouth daily.) 90 tablet 1  . potassium chloride (KLOR-CON) 10 MEQ tablet Take 1 tablet (10 mEq total) by mouth daily. Take While taking Lasix/furosemide 30 tablet 2  . pravastatin (PRAVACHOL) 40 MG tablet Take 1 tablet (40 mg total) by mouth daily. 90 tablet 1  . sertraline (ZOLOFT) 50 MG tablet Take 1 tablet (50 mg total) by mouth daily. 90 tablet 1  . tamsulosin (FLOMAX) 0.4 MG CAPS capsule TAKE (2) CAPSULES DAILY. (Patient taking differently: Take 0.8 mg by mouth daily.) 180 capsule 1  . traMADol (ULTRAM) 50 MG tablet Take 1 tablet (50 mg total) by mouth 2 (two) times daily. For back pain 60 tablet 5   No current facility-administered medications for this visit.   Allergies:  Neosporin [neomycin-bacitracin zn-polymyx] and Voltaren [diclofenac sodium]   Social History: The patient  reports that he has never smoked. He has never used smokeless tobacco. He reports that he does not drink alcohol and does not use drugs.   Family History: The patient's family history includes COPD in  his father; Diabetes in his mother; Heart attack (age of onset: 12) in his brother; Heart disease in his mother; Heart failure in his mother.   ROS:  Please see the history of present illness. Otherwise, complete review of systems is positive for none.  All other systems are reviewed and negative.   Physical Exam: VS:  BP 116/64   Pulse 62   Ht _0  (1.727 m)   Wt 182 lb 3.2 oz (82.6 kg)   SpO2 97%   BMI 27.70 kg/m , BMI Body mass index is 27.7 kg/m.  Wt Readings from Last 3 Encounters:  09/22/20 182 lb 3.2 oz (82.6 kg)  08/05/20 178 lb 6.4 oz (80.9 kg)  08/03/20 179 lb (81.2 kg)    General: Patient appears comfortable at rest. Neck: Supple, no elevated JVP or carotid bruits, no thyromegaly. Lungs: Clear to auscultation,  nonlabored breathing at rest. Cardiac: Regular rate and rhythm, no S3 or significant systolic murmur, no pericardial rub. Extremities: No pitting edema, distal pulses 2+. Skin: Warm and dry. Musculoskeletal: No kyphosis. Neuropsychiatric: Alert and oriented x3, affect grossly appropriate.  ECG:  EKG 07/19/2020 atrial fibrillation with rate of 105.  Nonspecific interventricular conduction delay, lateral infarct, age indeterminate, anteroseptal infarct, old  Recent Labwork: 07/19/2020: B Natriuretic Peptide 913.0 08/05/2020: ALT 10; AST 17; BUN 34; Creatinine, Ser 1.63; Hemoglobin 12.3; Platelets 206; Potassium 4.8; Sodium 136     Component Value Date/Time   CHOL 158 04/21/2020 1034   TRIG 57 07/19/2020 0742   TRIG 143 09/30/2014 0845   HDL 53 04/21/2020 1034   HDL 49 09/30/2014 0845   CHOLHDL 3.0 04/21/2020 1034   LDLCALC 90 04/21/2020 1034    Other Studies Reviewed Today:   Echocardiogram 09/07/2020 1. Left ventricular ejection fraction, by estimation, is 40 to 45%. The left ventricle has mildly decreased function. The left ventricle demonstrates global hypokinesis. There is mild left ventricular hypertrophy. Left ventricular diastolic parameters are  consistent with Grade I diastolic dysfunction (impaired relaxation). 2. Right ventricular systolic function is normal. The right ventricular size is normal. 3. Left atrial size was severely dilated. 4. The mitral valve is normal in structure. Trivial mitral valve regurgitation. No evidence of mitral stenosis. 5. The aortic valve is tricuspid. There is mild calcification of the aortic valve. There is mild thickening of the aortic valve. Aortic valve regurgitation is not visualized. No aortic stenosis is present. 6. The inferior vena cava is normal in size with greater than 50% respiratory variability, suggesting right atrial pressure of 3 mmHg. Comparison(s): Echocardiogram done 07/20/20 showed an EF of 35-40%.     Diagnostic Studies 10/2013 Echo Left ventricle: The cavity size was mildly dilated. Wall thickness was normal. Systolic function was mildly to moderately reduced. The estimated ejection fraction is 40%. Features are consistent with a pseudonormal left ventricular filling pattern, with concomitant abnormal relaxation and increased filling pressure (grade 2 diastolic dysfunction). Doppler parameters are consistent with high ventricular filling pressure. - Regional wall motion abnormality: Mild to moderate hypokinesis of the mid inferolateral myocardium. The remaining walls are mildly hypokinetic. - Aortic valve: Trileaflet; mildly thickened, mildly calcified leaflets. There was no stenosis. - Mitral valve: Mildly thickened leaflets . Mild to moderate regurgitation. - Left atrium: The atrium was moderately dilated. - Tricuspid valve: Mild regurgitation. Inadequate TR jet to accurately assess pulmonary pressures. - Pericardium, extracardiac: There was a left pleural effusion.  11/11/13 Lexiscan MPI Gated imaging reveals an EDV of 223, ESV of 153, TID ratio 0.99, and LVEF of 31% with global hypokinesis, most prominent in the apex and inferolateral  walls.  IMPRESSION: Intermediate risk Lexiscan Cardiolite. No diagnostic ST segment changes were noted to indicate ischemia, frequent PVCs were seen without sustained arrhythmia. Perfusion imaging is most consistent with scar affecting the inferior wall and inferolateral wall as outlined, no large ischemic zones however. LVEF is calculated at 31% with global hypokinesis that is most prominent at the apex and inferior wall. EDV indicates moderate to severe chamber dilatation. Findings are consistent with probable ischemic cardiomyopathy.   08/2014 echo Study Conclusions - Left ventricle: The cavity size was mildly dilated. Wall thickness was increased in a pattern of moderate LVH. Systolic function was moderately reduced. The estimated ejection fraction was in the range of 35% to 40%. There is hypokinesis to akinesis of the basal-mid inferolateral and inferior myocardium. Doppler parameters are consistent with  abnormal left ventricular relaxation (grade 1 diastolic dysfunction). - Aortic valve: Mildly to moderately calcified annulus. Trileaflet. - Mitral valve: Mildly calcified annulus. There was mild regurgitation. - Left atrium: The atrium was mildly to moderately dilated. - Right atrium: Central venous pressure (est): 3 mm Hg. - Atrial septum: Possible PFO or secundum ASD with apparent left to right flow noted by color Doppler. Cannot exclude vigorous venous return adjacent to septum. Agitated saline study could be considered for further evaluation. - Tricuspid valve: There was trivial regurgitation. - Pulmonary arteries: PA peak pressure: 17 mm Hg (S). - Pericardium, extracardiac: There was no pericardial effusion.  Recommendations: Moderate LVH with mild LV chamber dilatation and LVEF approximately 35-40%. Hypokinesis to akinesis of the mid to basal inferolateral/inferior walls noted suggestive of ischemic cardiomyopathy. Grade 1 diastolic dysfunction.  Compared to prior study in April 2015, wall motion has improved somewhat and ejection fraction looks to be higher, although graded in similar range last time. Mild to moderate left atrial enlargement. Mild MAC with mild mitral regurgitation. Possible PFO or secundum ASD with apparent left to right flow noted by color Doppler. Cannot exclude vigorous venous return adjacent to septum. Agitated saline study could be considered for further evaluation. Trivial tricuspid regurgitation with PASP normal range.       12/2018 echo 1. The left ventricle has low normal systolic function, with an ejection fraction of 50-55%. The cavity size was normal. There is mildly increased left ventricular wall thickness. Left ventricular diastolic Doppler parameters are consistent with  impaired relaxation. Elevated mean left atrial pressure. 2. The right ventricle has normal systolic function. The cavity was normal. There is no increase in right ventricular wall thickness. 3. Left atrial size was moderately dilated. 4. No evidence of mitral valve stenosis. 5. The aortic valve is tricuspid. No stenosis of the aortic valve. 6. The aortic root is normal in size and structure. 7. Pulmonary hypertension is indeterminant, inadequate TR jet.   06/2020 echo IMPRESSIONS  1. Left ventricular ejection fraction, by estimation, is 35 to 40%. The  left ventricle has moderately decreased function. The left ventricle  demonstrates regional wall motion abnormalities (see scoring  diagram/findings for description). There is mild  left ventricular hypertrophy. Left ventricular diastolic parameters are  indeterminate.  2. Right ventricular systolic function is normal. The right ventricular  size is normal. Tricuspid regurgitation signal is inadequate for assessing  PA pressure.  3. Left atrial size was mild to moderately dilated.  4. The mitral valve is grossly normal. Mild mitral valve regurgitation.  5.  The aortic valve is tricuspid. There is moderate calcification of the  aortic valve. Aortic valve regurgitation is not visualized. Mild aortic  valve stenosis. Aortic valve mean gradient measures 10.0 mmHg.  6. The inferior vena cava is normal in size with greater than 50%  respiratory variability, suggesting right atrial pressure of 3 mmHg.  7. Left pleural effusion is noted.    Assessment and Plan:  1. Chronic systolic heart failure (HCC)   2. Stage 3 chronic kidney disease, unspecified whether stage 3a or 3b CKD ([place])   3. Mixed hyperlipidemia    1. Chronic systolic heart failure (HCC) Patient denies any shortness of breath, weight gain, PND, orthopnea, or lower extremity edema.  Discussed recent echocardiogram results with him.  EF has improved to 40 to 45%.LV demonstrates global hypokinesis.  Mild LVH.  G1 DD.  Trivial MR.  Severely dilated LA. Dr. Harl Bowie had mentioned possibly doing an ischemic work-up.  I  discussed this with the patient.  He prefers to defer for now.  He states since increasing the carvedilol to 25 mg his blood pressures have been on the low side.  Also states since increasing the medication dosage he feels groggy.  Please decrease carvedilol dose from 25 mg p.o. twice daily down to 12.5 mg p.o. twice daily.  Continue Lasix 20 mg daily and.  Continue potassium supplementation  2. Stage 3 chronic kidney disease, unspecified whether stage 3a or 3b CKD ([place]) Recent labs on 08/05/2020: BUN 34, creatinine 1.63, GFR 41.  3.  Hyperlipidemia Continue pravastatin 40 mg daily.  Continue aspirin 81 mg daily.  Medication Adjustments/Labs and Tests Ordered: Current medicines are reviewed at length with the patient today.  Concerns regarding medicines are outlined above.   Disposition: Follow-up with Dr. Harl Bowie or APP 6 months  Signed, Levell July, NP 09/22/2020 2:19 PM    [facility] [place], [place], [place] [address] Phone: [phone]; Fax: [phone]

## 2020-09-21 NOTE — Patient Instructions (Signed)
Follow-up with RNCM on 09/29/20 by telephone Pick up furosemide at [facility] at [phone] as needed  Demetrios Loll, BSN, RN-BC Embedded Chronic Care Manager [facility] Care Management Direct Dial: [phone]

## 2020-09-21 NOTE — Chronic Care Management (AMB) (Signed)
  Chronic Care Management   Note  09/21/2020 Name: Taylor Cross MRN: [id number] DOB: Sep 02, 1945  Incoming call from patient regarding furosemide that was prescribed by Dr Wyline Mood at hospital discharge on 08/03/20. Patient has been unable to fill this at [facility]. Chart review shows that Carvedilol transmitted electronically but furosemide was set to "not print". Advised patient to call Dr Verna Czech office and ask them to send furosemide electronically to [facility].   Chart reviewed again and it is noted that this medication was sent in today appropriately and that [facility] did receive it electronically.   Follow up plan: Telephone follow up appointment with care management team member scheduled for: 09/29/20 with [facility] The patient has been provided with contact information for the care management team and has been advised to call with any health related questions or concerns.   Demetrios Loll, BSN, RN-BC Embedded Chronic Care Manager [facility] Care Management Direct Dial: [phone]

## 2020-09-21 NOTE — Telephone Encounter (Signed)
Medication sent to pharmacy  

## 2020-09-21 NOTE — Telephone Encounter (Signed)
*  STAT* If patient is at the pharmacy, call can be transferred to refill team.   1. Which medications need to be refilled? (please list name of each medication and dose if known)  (LASIX) 20 MG tablet    2. Which pharmacy/location (including street and city if local pharmacy) is medication to be sent to?  [facility]   3. Do they need a 30 day or 90 day supply?   Patient is out of his medication

## 2020-09-22 ENCOUNTER — Ambulatory Visit (INDEPENDENT_AMBULATORY_CARE_PROVIDER_SITE_OTHER): Payer: Medicare Other | Admitting: Family Medicine

## 2020-09-22 ENCOUNTER — Encounter: Payer: Self-pay | Admitting: Family Medicine

## 2020-09-22 VITALS — BP 116/64 | HR 62 | Ht 68.0 in | Wt 182.2 lb

## 2020-09-22 DIAGNOSIS — N183 Chronic kidney disease, stage 3 unspecified: Secondary | ICD-10-CM | POA: Diagnosis not present

## 2020-09-22 DIAGNOSIS — E782 Mixed hyperlipidemia: Secondary | ICD-10-CM

## 2020-09-22 DIAGNOSIS — I5022 Chronic systolic (congestive) heart failure: Secondary | ICD-10-CM | POA: Diagnosis not present

## 2020-09-22 MED ORDER — CARVEDILOL 12.5 MG PO TABS: 12.5000 mg | ORAL_TABLET | Freq: Two times a day (BID) | ORAL | 3 refills | Status: DC

## 2020-09-22 NOTE — Patient Instructions (Addendum)
Medication Instructions:   Your physician has recommended you make the following change in your medication:   Decrease carvedilol to 12.5 mg by mouth twice daily  Continue other medications the same  Labwork:  none  Testing/Procedures:  none  Follow-Up:  Your physician recommends that you schedule a follow-up appointment in: 6 months.  Any Other Special Instructions Will Be Listed Below (If Applicable).  If you need a refill on your cardiac medications before your next appointment, please call your pharmacy.

## 2020-09-29 ENCOUNTER — Telehealth: Payer: Medicare Other | Admitting: *Deleted

## 2020-09-30 ENCOUNTER — Telehealth: Payer: Self-pay | Admitting: *Deleted

## 2020-09-30 NOTE — Telephone Encounter (Signed)
  Chronic Care Management   Outreach Note  09/30/2020 Name: Taylor Cross MRN: [id number] DOB: March 08, 1946  Referred by: [name], MD Reason for referral : Chronic Care Management (Unsuccessful RN Follow up)   A second unsuccessful follow-up Telephone Visit was attempted today. The patient was referred to the case management team for assistance with care management and care coordination.   Clinical Goals: . Over the next 30 days, patient will be contacted by a Care Guide to reschedule their CCM Visit  Interventions and Plan . Chart reviewed in preparation for telephone visit . Collaboration with other care team members as needed . A HIPAA compliant phone message was left for the patient providing contact information and requesting a return call.  . Request sent to care guides to reach out and reschedule patient's telephone visit   Demetrios Loll, BSN, RN-BC Embedded Chronic Care Manager [facility] Care Management Direct Dial: [phone]

## 2020-10-03 NOTE — Telephone Encounter (Signed)
Patient has been rescheduled.

## 2020-10-13 ENCOUNTER — Telehealth: Payer: Self-pay | Admitting: *Deleted

## 2020-10-13 NOTE — Chronic Care Management (AMB) (Signed)
  Care Management   Note  10/13/2020 Name: Taylor Cross MRN: [id number] DOB: 1945/12/03  Taylor Cross is a 75 y.o. year old male who is a primary care patient of [name], [name], MD and is actively engaged with the care management team. I reached out to [name] by phone today to assist with re-scheduling a follow up visit with the RN Case Manager.  Follow up plan: Telephone appointment with care management team member scheduled for:10/27/2020  [facility] Guide, Embedded Care Coordination [facility] Management

## 2020-10-13 NOTE — Chronic Care Management (AMB) (Signed)
  Care Management   Note  10/13/2020 Name: Taylor Cross MRN: [id number] DOB: June 04, 1946  Abrham Maslowski [place] is a 75 y.o. year old male who is a primary care patient of [name], [name], MD and is actively engaged with the care management team. I reached out to [name] by phone today to assist with re-scheduling a follow up visit with the RN Case Manager  Follow up plan: Unsuccessful telephone outreach attempt made. A HIPAA compliant phone message was left for the patient providing contact information and requesting a return call.  The care management team will reach out to the patient again over the next 7 days.  If patient returns call to provider office, please advise to call Embedded Care Management Care Guide Gwenevere Ghazi at [phone].  Gwenevere Ghazi  Care Guide, Embedded Care Coordination [facility] Management

## 2020-10-17 ENCOUNTER — Telehealth: Payer: Medicare Other

## 2020-10-19 ENCOUNTER — Ambulatory Visit (INDEPENDENT_AMBULATORY_CARE_PROVIDER_SITE_OTHER): Payer: Medicare Other | Admitting: Family Medicine

## 2020-10-19 ENCOUNTER — Encounter: Payer: Self-pay | Admitting: Family Medicine

## 2020-10-19 ENCOUNTER — Other Ambulatory Visit: Payer: Self-pay

## 2020-10-19 VITALS — BP 119/63 | HR 60 | Temp 97.3°F | Ht 68.0 in | Wt 182.8 lb

## 2020-10-19 DIAGNOSIS — R351 Nocturia: Secondary | ICD-10-CM

## 2020-10-19 DIAGNOSIS — M1A30X0 Chronic gout due to renal impairment, unspecified site, without tophus (tophi): Secondary | ICD-10-CM

## 2020-10-19 DIAGNOSIS — N401 Enlarged prostate with lower urinary tract symptoms: Secondary | ICD-10-CM | POA: Diagnosis not present

## 2020-10-19 DIAGNOSIS — I10 Essential (primary) hypertension: Secondary | ICD-10-CM | POA: Diagnosis not present

## 2020-10-19 DIAGNOSIS — I5189 Other ill-defined heart diseases: Secondary | ICD-10-CM | POA: Diagnosis not present

## 2020-10-19 DIAGNOSIS — E782 Mixed hyperlipidemia: Secondary | ICD-10-CM

## 2020-10-19 DIAGNOSIS — K2100 Gastro-esophageal reflux disease with esophagitis, without bleeding: Secondary | ICD-10-CM

## 2020-10-19 DIAGNOSIS — N183 Chronic kidney disease, stage 3 unspecified: Secondary | ICD-10-CM | POA: Diagnosis not present

## 2020-10-19 DIAGNOSIS — M47816 Spondylosis without myelopathy or radiculopathy, lumbar region: Secondary | ICD-10-CM

## 2020-10-19 MED ORDER — CARVEDILOL 12.5 MG PO TABS: 12.5000 mg | ORAL_TABLET | Freq: Two times a day (BID) | ORAL | 3 refills | Status: DC

## 2020-10-19 MED ORDER — FINASTERIDE 5 MG PO TABS: 5.0000 mg | ORAL_TABLET | Freq: Every day | ORAL | 1 refills | Status: DC

## 2020-10-19 MED ORDER — ALLOPURINOL 100 MG PO TABS: 100.0000 mg | ORAL_TABLET | Freq: Every day | ORAL | 3 refills | Status: DC

## 2020-10-19 MED ORDER — PANTOPRAZOLE SODIUM 40 MG PO TBEC: 40.0000 mg | DELAYED_RELEASE_TABLET | Freq: Every day | ORAL | 3 refills | Status: DC

## 2020-10-19 MED ORDER — FLUTICASONE PROPIONATE 50 MCG/ACT NA SUSP: 2.0000 | Freq: Every day | NASAL | 11 refills | Status: DC

## 2020-10-19 MED ORDER — TAMSULOSIN HCL 0.4 MG PO CAPS: ORAL_CAPSULE | ORAL | 1 refills | Status: DC

## 2020-10-19 NOTE — Progress Notes (Signed)
Subjective:  Patient ID: Taylor Cross, male    DOB: [birth year]  Age: 75 y.o. MRN: [id number]  CC: Medical Management of Chronic Issues   HPI Taylor Cross presents for 24-monthfollow-up exam.  He has been stressed out by Covid and the [place]  He is having some depression symptoms but they do not really show up on the PHQ noted below.  He thinks the sertraline is working out well for him.  He just wants to have that renewed.     Prostate doing well - for age. Not having frequency. Up to bathroom 2-3 times  A night.  He is satisfied with this.  He is taking the finasteride and the tamsulosin without any concerns.  No side effects noted.   Pt. Wants to DC tramadol. Pain is dull. Located at joints, mainly knees wants to change to tylenol instead. He is using gabapentin with good relief. Back pain is mild lately.   He is very concerned about the use of furosemide and potassium.  He does not think he needs it.  He says that he started taking it while he was in the hospital.  He does have a history of diastolic and systolic dysfunction but has had no shortness of breath or edema.  He wants to take a trial off of the medicine.  History of hyperuricemia.  For this he takes allopurinol.  He has had no gout attacks in a long time.  He does have some joint pains but they are more chronic and achy also.  You scenario of gout.  He has no tophi.  Patient in for follow-up of GERD. Currently asymptomatic taking  PPI daily. There is no chest pain or heartburn. No hematemesis and no melena. No dysphagia or choking. Onset is remote. Progression is stable. Complicating factors, none.   Follow-up of hypertension. Patient has no history of headache chest pain or shortness of breath or recent cough. Patient also denies symptoms of TIA such as numbness weakness lateralizing. Patient checks  blood pressure at home and has not had any elevated readings recently. Patient denies side effects from his  medication. States taking it regularly.  Patient in for follow-up of elevated cholesterol. Doing well without complaints on current medication. Denies side effects of statin including myalgia and arthralgia and nausea. Also in today for liver function testing. Currently no chest pain, shortness of breath or other cardiovascular related symptoms noted.  Exam.  [facility] 10/19/2020 08/05/2020 04/21/2020  Decreased Interest 0 0 0  Down, Depressed, Hopeless 0 0 0  PHQ - 2 Score 0 0 0  Altered sleeping - - -  Tired, decreased energy - - -  Change in appetite - - -  Feeling bad or failure about yourself  - - -  Trouble concentrating - - -  Moving slowly or fidgety/restless - - -  Suicidal thoughts - - -  PHQ-9 Score - - -    History FCobainhas a past medical history of Acute respiratory failure with hypoxia and hypercapnia ([facility] (148/07/6551, Acute systolic CHF (congestive heart failure) ([place] (07/19/2020), Combined systolic and diastolic heart failure ([place], and Hyperlipidemia.   He has a past surgical history that includes Tonsillectomy and Hernia repair.   His family history includes COPD in his father; Diabetes in his mother; Heart attack (age of onset: 887 in his brother; Heart disease in his mother; Heart failure in his mother.He reports that he has never smoked. He  has never used smokeless tobacco. He reports that he does not drink alcohol and does not use drugs.    ROS Review of Systems  Constitutional: Negative.   HENT: Positive for congestion (Getting good relief with Flonase taken night). Negative for nosebleeds, postnasal drip and rhinorrhea.   Eyes: Negative for visual disturbance.  Respiratory: Negative for cough and shortness of breath.   Cardiovascular: Negative for chest pain and leg swelling.  Gastrointestinal: Negative for abdominal pain, diarrhea, nausea and vomiting.  Genitourinary: Negative for difficulty urinating.  Musculoskeletal: Negative for  arthralgias and myalgias.  Skin: Negative for rash.  Neurological: Negative for headaches.  Psychiatric/Behavioral: Negative for sleep disturbance.    Objective:  BP 119/63   Pulse 60   Temp (!) 97.3 F (36.3 C)   Ht 5' 8" (1.727 m)   Wt 182 lb 12.8 oz (82.9 kg)   SpO2 98%   BMI 27.79 kg/m   BP Readings from Last 3 Encounters:  10/19/20 119/63  09/22/20 116/64  08/05/20 133/81    Wt Readings from Last 3 Encounters:  10/19/20 182 lb 12.8 oz (82.9 kg)  09/22/20 182 lb 3.2 oz (82.6 kg)  08/05/20 178 lb 6.4 oz (80.9 kg)     Physical Exam Constitutional:      General: He is not in acute distress.    Appearance: He is well-developed.  HENT:     Head: Normocephalic and atraumatic.     Right Ear: External ear normal.     Left Ear: External ear normal.     Nose: Nose normal.  Eyes:     Conjunctiva/sclera: Conjunctivae normal.     Pupils: Pupils are equal, round, and reactive to light.  Cardiovascular:     Rate and Rhythm: Normal rate and regular rhythm.     Heart sounds: Normal heart sounds. No murmur heard.   Pulmonary:     Effort: Pulmonary effort is normal. No respiratory distress.     Breath sounds: Normal breath sounds. No wheezing or rales.  Abdominal:     Palpations: Abdomen is soft.     Tenderness: There is no abdominal tenderness.  Musculoskeletal:        General: Normal range of motion.     Cervical back: Normal range of motion and neck supple.  Skin:    General: Skin is warm and dry.  Neurological:     Mental Status: He is alert and oriented to person, place, and time.     Deep Tendon Reflexes: Reflexes are normal and symmetric.  Psychiatric:        Behavior: Behavior normal.        Thought Content: Thought content normal.        Judgment: Judgment normal.       Assessment & Plan:   Taylor Cross was seen today for medical management of chronic issues.  Diagnoses and all orders for this visit:  Essential hypertension -     carvedilol (COREG) 12.5  MG tablet; Take 1 tablet (12.5 mg total) by mouth 2 (two) times daily. -     CBC with Differential/Platelet -     CMP14+EGFR  Chronic renal impairment, stage 3 (moderate), unspecified whether stage 3a or 3b CKD (HCC) -     allopurinol (ZYLOPRIM) 100 MG tablet; Take 1 tablet (100 mg total) by mouth daily. -     CBC with Differential/Platelet -     CMP14+EGFR  Benign prostatic hyperplasia with nocturia -     finasteride (PROSCAR) 5 MG tablet;  Take 1 tablet (5 mg total) by mouth daily. -     tamsulosin (FLOMAX) 0.4 MG CAPS capsule; TAKE (2) CAPSULES DAILY. -     CBC with Differential/Platelet -     CWC37+SEGB  Diastolic dysfunction -     carvedilol (COREG) 12.5 MG tablet; Take 1 tablet (12.5 mg total) by mouth 2 (two) times daily. -     CBC with Differential/Platelet -     CMP14+EGFR  Gastroesophageal reflux disease with esophagitis without hemorrhage -     pantoprazole (PROTONIX) 40 MG tablet; Take 1 tablet (40 mg total) by mouth daily. TAKE 1 TABLET ONCE DAILY FOR REFLUX -     CBC with Differential/Platelet -     CMP14+EGFR  Mixed hyperlipidemia -     CBC with Differential/Platelet -     CMP14+EGFR -     Lipid panel  Spondylosis of lumbar region without myelopathy or radiculopathy -     CBC with Differential/Platelet -     CMP14+EGFR  Chronic gout due to renal impairment without tophus, unspecified site -     Uric acid -     CBC with Differential/Platelet -     CMP14+EGFR  Other orders -     fluticasone (FLONASE) 50 MCG/ACT nasal spray; Place 2 sprays into both nostrils daily.       I have discontinued Almond Lint. Lahmann's traMADol, albuterol, potassium chloride, and furosemide. I have also changed his allopurinol, pantoprazole, and fluticasone. Additionally, I am having him maintain his hydroquinone, mupirocin ointment, gabapentin, pravastatin, sertraline, loratadine, aspirin, guaiFENesin, guaiFENesin-dextromethorphan, acetaminophen, EQ Aspirin Adult Low Dose, finasteride,  tamsulosin, and carvedilol.  Allergies as of 10/19/2020      Reactions   Neosporin [neomycin-bacitracin Zn-polymyx] Rash   Voltaren [diclofenac Sodium] Other (See Comments)   GI upset      Medication List       Accurate as of October 19, 2020 11:15 AM. If you have any questions, ask your nurse or doctor.        STOP taking these medications   albuterol 108 (90 Base) MCG/ACT inhaler Commonly known as: VENTOLIN HFA Stopped by: Claretta Fraise, MD   furosemide 20 MG tablet Commonly known as: LASIX Stopped by: Claretta Fraise, MD   potassium chloride 10 MEQ tablet Commonly known as: KLOR-CON Stopped by: Claretta Fraise, MD   traMADol 50 MG tablet Commonly known as: ULTRAM Stopped by: Claretta Fraise, MD     TAKE these medications   acetaminophen 325 MG tablet Commonly known as: TYLENOL Take 2 tablets (650 mg total) by mouth every 6 (six) hours as needed for mild pain, fever or headache (or Fever >/= 101).   allopurinol 100 MG tablet Commonly known as: ZYLOPRIM Take 1 tablet (100 mg total) by mouth daily.   aspirin 81 MG chewable tablet Chew 1 tablet (81 mg total) by mouth daily with breakfast.   EQ Aspirin Adult Low Dose 81 MG EC tablet Generic drug: aspirin Take 81 mg by mouth daily.   carvedilol 12.5 MG tablet Commonly known as: COREG Take 1 tablet (12.5 mg total) by mouth 2 (two) times daily.   finasteride 5 MG tablet Commonly known as: PROSCAR Take 1 tablet (5 mg total) by mouth daily.   fluticasone 50 MCG/ACT nasal spray Commonly known as: FLONASE Place 2 sprays into both nostrils daily.   gabapentin 300 MG capsule Commonly known as: NEURONTIN TAKE  (1)  CAPSULE  TWICE DAILY. What changed:   how much to take  how  to take this  when to take this  additional instructions   guaiFENesin 600 MG 12 hr tablet Commonly known as: MUCINEX Take 1 tablet (600 mg total) by mouth 2 (two) times daily.   guaiFENesin-dextromethorphan 100-10 MG/5ML syrup Commonly  known as: ROBITUSSIN DM Take 5 mLs by mouth every 4 (four) hours as needed for cough.   hydroquinone 4 % cream APPLY TO AFFECTED AREAS TWICE A DAY What changed: See the new instructions.   loratadine 10 MG tablet Commonly known as: CLARITIN TAKE ONE TABLET AT BEDTIME What changed: when to take this   mupirocin ointment 2 % Commonly known as: BACTROBAN Apply 2 (two) times daily. To affected areas What changed:   how much to take  how to take this  when to take this  additional instructions   pantoprazole 40 MG tablet Commonly known as: PROTONIX Take 1 tablet (40 mg total) by mouth daily. TAKE 1 TABLET ONCE DAILY FOR REFLUX What changed:   how much to take  how to take this  when to take this Changed by: Claretta Fraise, MD   pravastatin 40 MG tablet Commonly known as: PRAVACHOL Take 1 tablet (40 mg total) by mouth daily.   sertraline 50 MG tablet Commonly known as: ZOLOFT Take 1 tablet (50 mg total) by mouth daily.   tamsulosin 0.4 MG Caps capsule Commonly known as: FLOMAX TAKE (2) CAPSULES DAILY. What changed:   how much to take  how to take this  when to take this  additional instructions        Follow-up: Return in about 6 months (around 04/21/2021) for [employer]ete physical.  Claretta Fraise, M.D.

## 2020-10-20 LAB — CMP14+EGFR
ALT: 12 IU/L (ref 0–44)
AST: 10 IU/L (ref 0–40)
Albumin/Globulin Ratio: 1.8 (ref 1.2–2.2)
Albumin: 4.4 g/dL (ref 3.7–4.7)
Alkaline Phosphatase: 72 IU/L (ref 44–121)
BUN/Creatinine Ratio: 19 (ref 10–24)
BUN: 29 mg/dL — ABNORMAL HIGH (ref 8–27)
Bilirubin Total: 0.5 mg/dL (ref 0.0–1.2)
CO2: 21 mmol/L (ref 20–29)
Calcium: 9.1 mg/dL (ref 8.6–10.2)
Chloride: 101 mmol/L (ref 96–106)
Creatinine, Ser: 1.54 mg/dL — ABNORMAL HIGH (ref 0.76–1.27)
Globulin, Total: 2.5 g/dL (ref 1.5–4.5)
Glucose: 87 mg/dL (ref 65–99)
Potassium: 4.8 mmol/L (ref 3.5–5.2)
Sodium: 141 mmol/L (ref 134–144)
Total Protein: 6.9 g/dL (ref 6.0–8.5)
eGFR: 47 mL/min/{1.73_m2} — ABNORMAL LOW (ref >59)

## 2020-10-20 LAB — CBC WITH DIFFERENTIAL/PLATELET
Basophils Absolute: 0 10*3/uL (ref 0.0–0.2)
Basos: 0 %
EOS (ABSOLUTE): 0.2 10*3/uL (ref 0.0–0.4)
Eos: 4 %
Hematocrit: 38.4 % (ref 37.5–51.0)
Hemoglobin: 12.1 g/dL — ABNORMAL LOW (ref 13.0–17.7)
Immature Grans (Abs): 0 10*3/uL (ref 0.0–0.1)
Immature Granulocytes: 0 %
Lymphocytes Absolute: 2 10*3/uL (ref 0.7–3.1)
Lymphs: 36 %
MCH: 27.6 pg (ref 26.6–33.0)
MCHC: 31.5 g/dL (ref 31.5–35.7)
MCV: 88 fL (ref 79–97)
Monocytes Absolute: 0.6 10*3/uL (ref 0.1–0.9)
Monocytes: 12 %
Neutrophils Absolute: 2.7 10*3/uL (ref 1.4–7.0)
Neutrophils: 48 %
Platelets: 184 10*3/uL (ref 150–450)
RBC: 4.38 x10E6/uL (ref 4.14–5.80)
RDW: 15.9 % — ABNORMAL HIGH (ref 11.6–15.4)
WBC: 5.6 10*3/uL (ref 3.4–10.8)

## 2020-10-20 LAB — LIPID PANEL
Chol/HDL Ratio: 3.3 ratio (ref 0.0–5.0)
Cholesterol, Total: 176 mg/dL (ref 100–199)
HDL: 53 mg/dL (ref >39)
LDL Chol Calc (NIH): 108 mg/dL — ABNORMAL HIGH (ref 0–99)
Triglycerides: 79 mg/dL (ref 0–149)
VLDL Cholesterol Cal: 15 mg/dL (ref 5–40)

## 2020-10-20 LAB — URIC ACID: Uric Acid: 7.6 mg/dL (ref 3.8–8.4)

## 2020-10-21 ENCOUNTER — Other Ambulatory Visit: Payer: Self-pay | Admitting: Family Medicine

## 2020-10-24 NOTE — Progress Notes (Signed)
Hello Taylor Cross,  Your lab result is normal and/or stable.Some minor variations that are not significant are commonly marked abnormal, but do not represent any medical problem for you.  Best regards, Janee Ureste, M.D.

## 2020-10-27 ENCOUNTER — Ambulatory Visit (INDEPENDENT_AMBULATORY_CARE_PROVIDER_SITE_OTHER): Payer: Medicare Other | Admitting: *Deleted

## 2020-10-27 DIAGNOSIS — I5042 Chronic combined systolic (congestive) and diastolic (congestive) heart failure: Secondary | ICD-10-CM

## 2020-10-27 DIAGNOSIS — J438 Other emphysema: Secondary | ICD-10-CM

## 2020-10-27 DIAGNOSIS — I10 Essential (primary) hypertension: Secondary | ICD-10-CM | POA: Diagnosis not present

## 2020-10-27 NOTE — Chronic Care Management (AMB) (Signed)
Chronic Care Management   CCM RN Visit Note  10/27/2020 Name: Taylor Cross MRN: [id number] DOB: Jan 11, 1946  Subjective: Taylor Cross is a 75 y.o. year old male who is a primary care patient of [name], [name], MD. The care management team was consulted for assistance with disease management and care coordination needs.    Engaged with patient by telephone for follow up visit in response to provider referral for case management and/or care coordination services.   Consent to Services:  The patient was given information about Chronic Care Management services, agreed to services, and gave verbal consent prior to initiation of services.  Please see initial visit note for detailed documentation.   Patient agreed to services and verbal consent obtained.   Assessment: Review of patient past medical history, allergies, medications, health status, including review of consultants reports, laboratory and other test data, was performed as part of comprehensive evaluation and provision of chronic care management services.   SDOH (Social Determinants of Health) assessments and interventions performed:    CCM Care Plan  Allergies  Allergen Reactions  . Neosporin [Neomycin-Bacitracin Zn-Polymyx] Rash  . Voltaren [Diclofenac Sodium] Other (See Comments)    GI upset    Outpatient Encounter Medications as of 10/27/2020  Medication Sig  . acetaminophen (TYLENOL) 325 MG tablet Take 2 tablets (650 mg total) by mouth every 6 (six) hours as needed for mild pain, fever or headache (or Fever >/= 101).  Marland Kitchen allopurinol (ZYLOPRIM) 100 MG tablet Take 1 tablet (100 mg total) by mouth daily.  Marland Kitchen aspirin 81 MG chewable tablet Chew 1 tablet (81 mg total) by mouth daily with breakfast.  . carvedilol (COREG) 12.5 MG tablet Take 1 tablet (12.5 mg total) by mouth 2 (two) times daily.  Burman Blacksmith ASPIRIN ADULT LOW DOSE 81 MG EC tablet Take 81 mg by mouth daily.  . finasteride (PROSCAR) 5 MG tablet Take 1 tablet (5 mg total) by  mouth daily.  . fluticasone (FLONASE) 50 MCG/ACT nasal spray Place 2 sprays into both nostrils daily.  Marland Kitchen gabapentin (NEURONTIN) 300 MG capsule TAKE  (1)  CAPSULE  TWICE DAILY. (Patient taking differently: Take 300 mg by mouth 2 (two) times daily.)  . guaiFENesin (MUCINEX) 600 MG 12 hr tablet Take 1 tablet (600 mg total) by mouth 2 (two) times daily.  Marland Kitchen guaiFENesin-dextromethorphan (ROBITUSSIN DM) 100-10 MG/5ML syrup Take 5 mLs by mouth every 4 (four) hours as needed for cough.  . hydroquinone 4 % cream APPLY TO AFFECTED AREAS TWICE A DAY (Patient taking differently: Apply 1 application topically in the morning and at bedtime.)  . loratadine (CLARITIN) 10 MG tablet TAKE ONE TABLET AT BEDTIME (Patient taking differently: Take 10 mg by mouth at bedtime.)  . mupirocin ointment (BACTROBAN) 2 % Apply 2 (two) times daily. To affected areas (Patient taking differently: Apply 1 application topically 2 (two) times daily. To affected areas)  . pantoprazole (PROTONIX) 40 MG tablet Take 1 tablet (40 mg total) by mouth daily. TAKE 1 TABLET ONCE DAILY FOR REFLUX  . pravastatin (PRAVACHOL) 40 MG tablet Take 1 tablet (40 mg total) by mouth daily.  . sertraline (ZOLOFT) 50 MG tablet Take 1 tablet (50 mg total) by mouth daily.  . tamsulosin (FLOMAX) 0.4 MG CAPS capsule TAKE (2) CAPSULES DAILY.   No facility-administered encounter medications on file as of 10/27/2020.    Patient Active Problem List   Diagnosis Date Noted  . Benign prostatic hyperplasia with nocturia 10/20/2019  . Spondylosis of lumbar region  without myelopathy or radiculopathy 11/10/2017  . Gastroesophageal reflux disease with esophagitis 06/11/2017  . Hernia of abdominal cavity 08/16/2015  . Essential hypertension 08/04/2015  . Inguinal hernia 08/04/2015  . Snoring 02/04/2014  . Emphysema lung (HCC) 12/02/2013  . Systolic dysfunction 11/11/2013  . Diastolic dysfunction 11/11/2013  . PVC's (premature ventricular contractions) 10/20/2013  .  Mixed hyperlipidemia 10/20/2013    Conditions to be addressed/monitored:CHF, HTN and Pulmonary Disease  Care Plan : RNCM: Heart Failure (Adult)  Updates made by [name], RN since 10/27/2020 12:00 AM    Problem: Heart Failure   Priority: Medium    Goal: RNCM: HF Exacerbation Completed 10/27/2020  Start Date: 07/25/2020  Expected End Date: 11/22/2020  This Visit's Progress: On track  Priority: Medium    Task: RNCM: Identify and Minimize Risk of Heart Failure Exacerbation Completed 10/27/2020  Note:   Care Management Activities:    - barriers to lifestyle changes reviewed and addressed - barriers to treatment reviewed and addressed - cognitive screening completed and reviewed - healthy lifestyle promoted - medication-adherence assessment completed - self-awareness of signs/symptoms of worsening disease encouraged       Long-Range Goal: Heart Failure Managed   Start Date: 10/27/2020  This Visit's Progress: On track  Priority: Medium  Note:   Current Barriers:  . Chronic Disease Management support and education needs related to heart failure . Lacks social connections . Does not do daily weights  Nurse Case Manager Clinical Goal(s):  . patient will work with cardiologist to address needs related to medical management of heart failure . patient will meet with RN Care Manager to address self-management of heart failure  Interventions:  . 1:1 collaboration with [name], MD regarding development and update of comprehensive plan of care as evidenced by provider attestation and co-signature . Inter-disciplinary care team collaboration (see longitudinal plan of care) . Chart reviewed including recent cardiology and PCP office notes, hospital notes, lab results, and imaging reports . Reviewed and discussed medications o Dr Darlyn Read d/c lasix at most recent office visit . Reinforced need for daily weights each morning at urinating and to call cardiologist with any weight  gain of more than 3 lbs overnight or 5 lbs in one week . Reinforced need for low sodium, DASH, diet . Reviewed upcoming appointments with PCP and cardiologist in Sept 2022 . Assessed knowledge of heart failure and worsening symptoms o Patient reports that he feels well at present . Encouraged patient to reach out to cardiologist or seek medical attention for any symptoms of worsening heart failure . Encouraged patient to reach out to [facility] Manager as needed  Patient Goals/Self-Care Activities Over the next 60 days, patient will: . Take medication as prescribed . Weight each morning at urinating . Call Cardiologist with any weight gain of more than 3 lbs overnight or more than 5 lbs in one week . Follow a low sodium, DASH, diet . Call RN Care Manager as needed [phone]    Care Plan : RNCM: COPD (Adult)  Updates made by [name], RN since 10/27/2020 12:00 AM    Problem: Respiratory Disorder   Priority: Medium    Long-Range Goal: Management of COPD   This Visit's Progress: On track  Priority: Medium  Note:   Current Barriers:  . Chronic Disease Management support and education needs related to COPD . Lacks Sports administrator Case Manager Clinical Goal(s):  . patient will verbalize understanding of plan for COPD management and when to  seek appropriate levels of medical care . patient will work with PCP to address needs related to medical management of COPD . patient will meet with RN Care Manager to address self-management of COPD  Interventions:  . 1:1 collaboration with [name], MD regarding development and update of comprehensive plan of care as evidenced by provider attestation and co-signature . Inter-disciplinary care team collaboration (see longitudinal plan of care) . Evaluation of current treatment plan related to COPD and patient's adherence to plan as established by provider. . Chart reviewed including relevant office notes, hospital notes, lab  results, and imaging reports . Discussed recent office visit with PCP . Discussed self-management of COPD o Patient feels well at this time and had no complaints of COPD exacerbation . Discussed immunizations o Has had 3 covid vaccines o Has had pneumovax and prevnar - Patient questioned if he needed another pneumonia vaccine - Advised that 03/2021 will be 5 years since Prevnar administration and that PCP may recommend a dose of pneumovax at that point - Patient will talk with PCP at Sept 2022 office visit about whether pneumovax is needed . Reviewed and discussed medications o Compliant with medications and affordability is not an issue . Encouraged patient to increase activity level as tolerated with an ultimate goal of at least 150 minutes a week  Patient Goals/Self-Care Activities Over the next 60 days, patient will: . Take mediations as prescribed . Monitor for signs/symptoms of COPD exacerbation . Call PCP office/provider on-call [phone]) or seek medical attention for any signs of exacerbation . Call RN Care Manager as needed [phone]    Care Plan : RNCM: Hypertension (Adult)  Updates made by [name], RN since 10/27/2020 12:00 AM    Problem: Hypertension (Hypertension)   Priority: Medium    Long-Range Goal: Hypertension Management   This Visit's Progress: On track  Recent Progress: Not on track  Priority: Medium  Note:   Objective: BP Readings from Last 3 Encounters:  10/19/20 119/63  09/22/20 116/64  08/05/20 133/81   Current Barriers:  . Chronic Disease Management support and education needs related to hypertension in a patient with heart failure  Nurse Case Manager Clinical Goal(s):  . patient will work with PCP and cardiologist to address needs related to Medical management of hypertension . patient will meet with RN Care Manager to address self-management of hypertension . Over the next 30 days, patient will demonstrate continued self-care  management as evidenced by checking and recording blood pressure at least 3 times per week  Interventions:  . 1:1 collaboration with [name], MD regarding development and update of comprehensive plan of care as evidenced by provider attestation and co-signature . Inter-disciplinary care team collaboration (see longitudinal plan of care) . Evaluation of current treatment plan related to hypertension and patient's adherence to plan as established by provider. . Chart reviewed including relevant office notes, lab results, and hospital reports . Reviewed and discussed medications and carvedilol dose change . Discussed home blood pressure monitoring o Patient is monitoring blood pressure every other day  o Reports that readings are better and averaging 120/70 o Pulse rate is averaging 65-70 . Discussed symptoms of orthostatic hypotension o Patient previously felt lightheaded and weak upon standing but that has improved . Previously provided education provided on orthostatic hypotension and how to avoid it o Stand up slowly and wait until he feels more balanced and less lightheaded before he starts walking o Encouraged patient to use cane or assistive device when feeling  lightheaded . Reviewed upcoming appointments with cardiologist and PCP Sept 2022 . Encouraged patient to continue checking blood pressure at least 3 times per week and to write it down in a log . Advised to take log to PCP and cardiology appointments . Encouraged patient to reach out to cardiologist or PCP with any readings outside of recommended range . Discussed overall feeling of well-being o Reports that he feels well at present . Provided with RN Care Manager contact number and encouraged to reach out as needed  Patient Goals/Self-Care Activities Over the next 60 days, patient will: . Take blood pressure at least 3 times per week . Record blood pressure, pulse, and time of day in a log . Call cardiologist with any  returning symptoms of lightheadedness or dizziness . Call cardiologist if you blood pressure is running lower or higher than he recommended . Call RN Care Manager as needed [phone] . Take medications as prescribed . Low sodium, DASH, diet     Follow Up Plan:  . Telephone follow up appointment with care management team member scheduled for: 12/14/20 with RNCM . The patient has been provided with contact information for the care management team and has been advised to call with any health related questions or concerns.  . Next PCP appointment scheduled for: 04/19/21 with Dr Darlyn Read . Next cardiology appointment scheduled for: 04/13/21 with Dr Wyline Mood  Demetrios Loll, BSN, RN-BC Embedded Chronic Care Manager [facility] Care Management Direct Dial: [phone]

## 2020-10-27 NOTE — Patient Instructions (Signed)
Visit Information  PATIENT GOALS: Goals Addressed            This Visit's Progress   . COPD Managed   On track    Timeframe:  Long-Range Goal Priority:  Medium Start Date:   10/27/20                          Expected End Date: 10/19/21                       Follow-up: 12/14/20  . Take mediations as prescribed . Monitor for signs/symptoms of COPD exacerbation . Call PCP office/provider on-call [phone]) or seek medical attention for any signs of exacerbation . Call RN Care Manager as needed [phone]    . Heart Failure Managed   On track    Timeframe:  Long-Range Goal Priority:  Medium Start Date:  07-25-2020                           Expected End Date:    09/26/2020                   Follow-up: 12/14/20  .  Take medication as prescribed . Weight each morning at urinating . Call Cardiologist with any weight gain of more than 3 lbs overnight or more than 5 lbs in one week . Follow a low sodium, DASH, diet . Call RN Care Manager as needed [phone]   Why is this important?    You will be able to handle your symptoms better if you keep track of them.   Making some simple changes to your lifestyle will help.   Eating healthy is one thing you can do to take good care of yourself.        . Hypertension Management   On track    Timeframe:  Long-Range Goal Priority:  Medium Start Date: 09/01/20                            Expected End Date: 03/01/21                      Follow-up: 12/14/20  . Take blood pressure at least 3 times per week . Record blood pressure, pulse, and time of day in a log . Call cardiologist with any returning symptoms of lightheadedness or dizziness . Call cardiologist if you blood pressure is running lower or higher than he recommended . Call RN Care Manager as needed [phone] . Take medications as prescribed . Low sodium, DASH, diet       Patient verbalizes understanding of instructions provided today and agrees to view in MyChart.    Follow Up Plan:  . Telephone follow up appointment with care management team member scheduled for: 12/14/20 with RNCM . The patient has been provided with contact information for the care management team and has been advised to call with any health related questions or concerns.  . Next PCP appointment scheduled for: 04/19/21 with Dr Darlyn Read . Next cardiology appointment scheduled for: 04/13/21 with Dr Wyline Mood  Demetrios Loll, BSN, RN-BC Embedded Chronic Care Manager [facility] / [facility] Care Management Direct Dial: [phone]

## 2020-11-06 ENCOUNTER — Other Ambulatory Visit: Payer: Self-pay | Admitting: Family Medicine

## 2020-11-21 ENCOUNTER — Other Ambulatory Visit: Payer: Self-pay | Admitting: Family Medicine

## 2020-12-13 ENCOUNTER — Ambulatory Visit (INDEPENDENT_AMBULATORY_CARE_PROVIDER_SITE_OTHER): Payer: Medicare Other | Admitting: *Deleted

## 2020-12-13 ENCOUNTER — Other Ambulatory Visit: Payer: Self-pay | Admitting: Family Medicine

## 2020-12-13 DIAGNOSIS — I10 Essential (primary) hypertension: Secondary | ICD-10-CM | POA: Diagnosis not present

## 2020-12-13 DIAGNOSIS — J438 Other emphysema: Secondary | ICD-10-CM | POA: Diagnosis not present

## 2020-12-13 DIAGNOSIS — I5042 Chronic combined systolic (congestive) and diastolic (congestive) heart failure: Secondary | ICD-10-CM | POA: Diagnosis not present

## 2020-12-13 NOTE — Patient Instructions (Signed)
Visit Information  PATIENT GOALS: Goals Addressed            This Visit's Progress   . COPD Managed   On track    Timeframe:  Long-Range Goal Priority:  Medium Start Date:   10/27/20                          Expected End Date: 10/19/21                       Follow-up: 01/16/21  . Take mediations as prescribed . Monitor for signs/symptoms of COPD exacerbation . Call PCP office/provider on-call [phone]) or seek appropriate medical attention for any signs of exacerbation . Call RN Care Manager as needed [phone]    . Heart Failure Managed   On track    Timeframe:  Long-Range Goal Priority:  Medium Start Date:  07-25-2020                           Expected End Date:    09/26/2020                   Follow-up: 01/16/21  . Take medication as prescribed . Weight each morning after urinating . Call Cardiologist with any weight gain of more than 3 lbs overnight or more than 5 lbs in one week . Call PCP or cardiologist or seek appropriate medical attention for any new or worsening symptoms . Keep all medical appointments . Follow a low sodium, DASH, diet . Call RN Care Manager as needed [phone]   Why is this important?    You will be able to handle your symptoms better if you keep track of them.   Making some simple changes to your lifestyle will help.   Eating healthy is one thing you can do to take good care of yourself.        . Hypertension Management   On track    Timeframe:  Long-Range Goal Priority:  Medium Start Date: 09/01/20                            Expected End Date: 03/01/21                      Follow-up: 01/16/21  . Take blood pressure at least 3 times per week . Record blood pressure, pulse, and time of day in a log . Call cardiologist with any returning symptoms of lightheadedness or dizziness . Call cardiologist if you blood pressure is running lower or higher than he recommended . Call RN Care Manager as needed [phone] . Take medications  as prescribed . Low sodium, DASH, diet       Patient verbalizes understanding of instructions provided today and agrees to view in MyChart.   Follow Up Plan:  . Telephone follow up appointment with care management team member scheduled for: 01/16/21 with RNCM . The patient has been provided with contact information for the care management team and has been advised to call with any health related questions or concerns.  . Next PCP appointment scheduled for: 04/19/21 with Dr Darlyn Read . Next cardiology appointment scheduled for: 04/13/21 with Dr Wyline Mood  Demetrios Loll, BSN, RN-BC Embedded Chronic Care Manager [facility] Care Management Direct Dial: [phone]

## 2020-12-13 NOTE — Chronic Care Management (AMB) (Signed)
Chronic Care Management   CCM RN Visit Note  12/13/2020 Name: MAXIMILLIANO KERSH MRN: [id number] DOB: 17-Nov-1945  Subjective: PRICE LACHAPELLE is a 75 y.o. year old male who is a primary care patient of [name], [name], MD. The care management team was consulted for assistance with disease management and care coordination needs.    Engaged with patient by telephone for follow up visit in response to provider referral for case management and/or care coordination services.   Consent to Services:  The patient was given information about Chronic Care Management services, agreed to services, and gave verbal consent prior to initiation of services.  Please see initial visit note for detailed documentation.   Patient agreed to services and verbal consent obtained.   Assessment: Review of patient past medical history, allergies, medications, health status, including review of consultants reports, laboratory and other test data, was performed as part of comprehensive evaluation and provision of chronic care management services.   SDOH (Social Determinants of Health) assessments and interventions performed:    CCM Care Plan  Allergies  Allergen Reactions  . Neosporin [Neomycin-Bacitracin Zn-Polymyx] Rash  . Voltaren [Diclofenac Sodium] Other (See Comments)    GI upset    Outpatient Encounter Medications as of 12/13/2020  Medication Sig  . acetaminophen (TYLENOL) 325 MG tablet Take 2 tablets (650 mg total) by mouth every 6 (six) hours as needed for mild pain, fever or headache (or Fever >/= 101).  Marland Kitchen allopurinol (ZYLOPRIM) 100 MG tablet Take 1 tablet (100 mg total) by mouth daily.  Marland Kitchen aspirin 81 MG chewable tablet Chew 1 tablet (81 mg total) by mouth daily with breakfast.  . carvedilol (COREG) 12.5 MG tablet Take 1 tablet (12.5 mg total) by mouth 2 (two) times daily.  Burman Blacksmith ASPIRIN ADULT LOW DOSE 81 MG EC tablet Take 81 mg by mouth daily.  . finasteride (PROSCAR) 5 MG tablet Take 1 tablet (5 mg total) by  mouth daily.  . fluticasone (FLONASE) 50 MCG/ACT nasal spray Place 2 sprays into both nostrils daily.  Marland Kitchen gabapentin (NEURONTIN) 300 MG capsule TAKE (1) CAPSULE TWICE DAILY.  Marland Kitchen guaiFENesin (MUCINEX) 600 MG 12 hr tablet Take 1 tablet (600 mg total) by mouth 2 (two) times daily.  Marland Kitchen guaiFENesin-dextromethorphan (ROBITUSSIN DM) 100-10 MG/5ML syrup Take 5 mLs by mouth every 4 (four) hours as needed for cough.  . hydroquinone 4 % cream APPLY TO AFFECTED AREAS TWICE A DAY (Patient taking differently: Apply 1 application topically in the morning and at bedtime.)  . loratadine (CLARITIN) 10 MG tablet TAKE ONE TABLET AT BEDTIME  . mupirocin ointment (BACTROBAN) 2 % Apply 2 (two) times daily. To affected areas (Patient taking differently: Apply 1 application topically 2 (two) times daily. To affected areas)  . pantoprazole (PROTONIX) 40 MG tablet Take 1 tablet (40 mg total) by mouth daily. TAKE 1 TABLET ONCE DAILY FOR REFLUX  . pravastatin (PRAVACHOL) 40 MG tablet Take 1 tablet (40 mg total) by mouth daily.  . sertraline (ZOLOFT) 50 MG tablet Take 1 tablet (50 mg total) by mouth daily.  . tamsulosin (FLOMAX) 0.4 MG CAPS capsule TAKE (2) CAPSULES DAILY.  . [DISCONTINUED] gabapentin (NEURONTIN) 300 MG capsule TAKE  (1)  CAPSULE  TWICE DAILY. (Patient taking differently: Take 300 mg by mouth 2 (two) times daily.)   No facility-administered encounter medications on file as of 12/13/2020.    Patient Active Problem List   Diagnosis Date Noted  . Benign prostatic hyperplasia with nocturia 10/20/2019  . Spondylosis  of lumbar region without myelopathy or radiculopathy 11/10/2017  . Gastroesophageal reflux disease with esophagitis 06/11/2017  . Hernia of abdominal cavity 08/16/2015  . Essential hypertension 08/04/2015  . Inguinal hernia 08/04/2015  . Snoring 02/04/2014  . Emphysema lung (HCC) 12/02/2013  . Systolic dysfunction 11/11/2013  . Diastolic dysfunction 11/11/2013  . PVC's (premature ventricular  contractions) 10/20/2013  . Mixed hyperlipidemia 10/20/2013    Conditions to be addressed/monitored:CHF, HTN and COPD  Care Plan : RNCM: Heart Failure (Adult)  Updates made by [name], RN since 12/13/2020 12:00 AM    Problem: Heart Failure   Priority: Medium    Long-Range Goal: Heart Failure Managed   Start Date: 10/27/2020  This Visit's Progress: On track  Recent Progress: On track  Priority: Medium  Note:   Current Barriers:  . Chronic Disease Management support and education needs related to heart failure . Lacks social connections . Does not do daily weights  Nurse Case Manager Clinical Goal(s):  . patient will work with cardiologist to address needs related to medical management of heart failure . patient will meet with RN Care Manager to address self-management of heart failure . Patient will demonstrate improved self-health management by weighing and recording weight each morning and by calling cardiologist with a weight gain of more than 3 lbs in 24 hours or 5 lbs in one week  Interventions:  . 1:1 collaboration with [name], MD regarding development and update of comprehensive plan of care as evidenced by provider attestation and co-signature . Inter-disciplinary care team collaboration (see longitudinal plan of care) . Chart reviewed including recent cardiology and PCP office notes, hospital notes, lab results, and imaging reports . Reviewed and discussed medications . Reinforced need for daily weights each morning after urinating and to call cardiologist with any weight gain of more than 3 lbs overnight or 5 lbs in one week . Discussed HPI with patient o No lower extremity swelling, SOB, or orthopnea . Reinforced need for low sodium, DASH, diet . Reviewed upcoming appointments with PCP and cardiologist in Sept 2022 . Assessed knowledge of heart failure and worsening symptoms o Patient reports that he feels well at present . Encouraged patient to reach  out to cardiologist or seek medical attention for any symptoms of worsening heart failure . Encouraged patient to reach out to [facility] Manager as needed  Patient Goals/Self-Care Activities Over the next 60 days, patient will: . Take medication as prescribed . Weight each morning after urinating . Call Cardiologist with any weight gain of more than 3 lbs overnight or more than 5 lbs in one week . Call PCP or cardiologist or seek appropriate medical attention for any new or worsening symptoms . Keep all medical appointments . Follow a low sodium, DASH, diet . Call RN Care Manager as needed [phone]    Care Plan : RNCM: COPD (Adult)  Updates made by [name], RN since 12/13/2020 12:00 AM    Problem: Respiratory Disorder   Priority: Medium    Long-Range Goal: Management of COPD   This Visit's Progress: On track  Recent Progress: On track  Priority: Medium  Note:   Current Barriers:  . Chronic Disease Management support and education needs related to COPD . Lacks Sports administrator Case Manager Clinical Goal(s):  . patient will work with PCP to address needs related to medical management of COPD . patient will meet with RN Care Manager to address self-management of COPD . the patient will demonstrate  ongoing self health care management ability as evidenced by monitoring for signs/symptoms of COPD exacerbation and by timely utilizing appropriate interventions *  Interventions:  . 1:1 collaboration with [name], MD regarding development and update of comprehensive plan of care as evidenced by provider attestation and co-signature . Inter-disciplinary care team collaboration (see longitudinal plan of care) . Evaluation of current treatment plan related to COPD and patient's adherence to plan as established by provider. . Chart reviewed including relevant notes, lab results, and imaging reports . Discussed self-management of COPD o Patient feels well at this  time and had no complaints of COPD exacerbation o He did have "cold" symptoms for about 5 days several weeks ago but that has resolved - Used recommended OTC treatments . Discussed immunizations o Has had 3 covid vaccines o Has had pneumovax and prevnar - Talk with PCP about whether another dose of Pneumovax if appropriate in Sept 2022 . Reviewed and discussed medications o Compliant with medications and affordability is not an issue . Encouraged patient to increase activity level as tolerated with an ultimate goal of at least 150 minutes a week . Provided with RN Care Manager contact information and encouraged to reach out as needed  Patient Goals/Self-Care Activities Over the next 60 days, patient will: . Take mediations as prescribed . Monitor for signs/symptoms of COPD exacerbation . Call PCP office/provider on-call [phone]) or seek appropriate medical attention for any signs of exacerbation . Call RN Care Manager as needed [phone]    Care Plan : RNCM: Hypertension (Adult)  Updates made by [name], RN since 12/13/2020 12:00 AM    Problem: Hypertension (Hypertension)   Priority: Medium    Long-Range Goal: Hypertension Management   This Visit's Progress: On track  Recent Progress: On track  Priority: Medium  Note:   Objective: BP Readings from Last 3 Encounters:  10/19/20 119/63  09/22/20 116/64  08/05/20 133/81   Current Barriers:  . Chronic Disease Management support and education needs related to hypertension in a patient with heart failure  Nurse Case Manager Clinical Goal(s):  . patient will work with PCP and cardiologist to address needs related to Medical management of hypertension . patient will meet with RN Care Manager to address self-management of hypertension . Patient will demonstrate continued self-care management as evidenced by checking and recording blood pressure at least 3 times per week  Interventions:  . 1:1 collaboration with  [name], MD regarding development and update of comprehensive plan of care as evidenced by provider attestation and co-signature . Inter-disciplinary care team collaboration (see longitudinal plan of care) . Evaluation of current treatment plan related to hypertension and patient's adherence to plan as established by provider. . Chart reviewed including relevant office notes, lab results, and hospital reports . Reviewed and discussed medications o Compliant with medications . Reinforced need for home blood pressure monitoring . Encouraged patient to continue checking blood pressure at least 3 times per week and to write it down in a log . Advised to take log to PCP and cardiology appointments . Previously provided education provided on orthostatic hypotension and how to avoid it o Stand up slowly and wait until he feels more balanced and less lightheaded before he starts walking o Encouraged patient to use cane or assistive device when feeling lightheaded . Reviewed upcoming appointments with cardiologist and PCP Sept 2022 . Encouraged patient to reach out to cardiologist or PCP with any readings outside of recommended range . Discussed overall feeling of  well-being o Reports that he feels well at present . Provided with RN Care Manager contact number and encouraged to reach out as needed  Patient Goals/Self-Care Activities Over the next 60 days, patient will: . Take blood pressure at least 3 times per week . Record blood pressure, pulse, and time of day in a log . Call cardiologist with any returning symptoms of lightheadedness or dizziness . Call cardiologist if you blood pressure is running lower or higher than he recommended . Call RN Care Manager as needed [phone] . Take medications as prescribed . Low sodium, DASH, diet    Follow Up Plan:  . Telephone follow up appointment with care management team member scheduled for: 01/16/21 with RNCM . The patient has been  provided with contact information for the care management team and has been advised to call with any health related questions or concerns.  . Next PCP appointment scheduled for: 04/19/21 with Dr Darlyn Read . Next cardiology appointment scheduled for: 04/13/21 with Dr Wyline Mood  Demetrios Loll, BSN, RN-BC Embedded Chronic Care Manager [facility] / [facility] Care Management Direct Dial: [phone]

## 2020-12-14 ENCOUNTER — Telehealth: Payer: Medicare Other

## 2020-12-27 ENCOUNTER — Other Ambulatory Visit: Payer: Self-pay | Admitting: Family Medicine

## 2021-01-16 ENCOUNTER — Ambulatory Visit (INDEPENDENT_AMBULATORY_CARE_PROVIDER_SITE_OTHER): Payer: Medicare Other | Admitting: *Deleted

## 2021-01-16 DIAGNOSIS — J438 Other emphysema: Secondary | ICD-10-CM | POA: Diagnosis not present

## 2021-01-16 DIAGNOSIS — I10 Essential (primary) hypertension: Secondary | ICD-10-CM

## 2021-01-16 DIAGNOSIS — I5042 Chronic combined systolic (congestive) and diastolic (congestive) heart failure: Secondary | ICD-10-CM | POA: Diagnosis not present

## 2021-01-16 NOTE — Chronic Care Management (AMB) (Signed)
Chronic Care Management   CCM RN Visit Note  01/16/2021 Name: Taylor Cross MRN: [id number] DOB: 1945/12/17  Subjective: Taylor Cross is a 75 y.o. year old male who is a primary care patient of [name], [name], MD. The care management team was consulted for assistance with disease management and care coordination needs.    Engaged with patient by telephone for follow up visit in response to provider referral for case management and/or care coordination services.   Consent to Services:  The patient was given information about Chronic Care Management services, agreed to services, and gave verbal consent prior to initiation of services.  Please see initial visit note for detailed documentation.   Patient agreed to services and verbal consent obtained.   Assessment:  Review of patient past medical history, allergies, medications, health status, including review of consultants reports, laboratory and other test data, was performed as part of comprehensive evaluation and provision of chronic care management services.   Patient reports feeling well. He is checking his blood pressure several times a week and it is typically WNL. He is not checking his weight. He does not have any COPD complaints.   SDOH (Social Determinants of Health) assessments and interventions performed:  SDOH Interventions    Flowsheet Row Most Recent Value  SDOH Interventions   Physical Activity Interventions [facility]       Plan:  Follow-up with RN Care Manager 02/23/21 regarding self-management of HTN, COPD, and CHF   CCM Care Plan  Allergies  Allergen Reactions   Neosporin [Neomycin-Bacitracin Zn-Polymyx] Rash   Voltaren [Diclofenac Sodium] Other (See Comments)    GI upset    Outpatient Encounter Medications as of 01/16/2021  Medication Sig   acetaminophen (TYLENOL) 325 MG tablet Take 2 tablets (650 mg total) by mouth every 6 (six) hours as needed for mild pain, fever or headache (or Fever >/= 101).    allopurinol (ZYLOPRIM) 100 MG tablet Take 1 tablet (100 mg total) by mouth daily.   aspirin 81 MG chewable tablet Chew 1 tablet (81 mg total) by mouth daily with breakfast.   carvedilol (COREG) 12.5 MG tablet Take 1 tablet (12.5 mg total) by mouth 2 (two) times daily.   EQ ASPIRIN ADULT LOW DOSE 81 MG EC tablet Take 81 mg by mouth daily.   finasteride (PROSCAR) 5 MG tablet Take 1 tablet (5 mg total) by mouth daily.   fluticasone (FLONASE) 50 MCG/ACT nasal spray Place 2 sprays into both nostrils daily.   gabapentin (NEURONTIN) 300 MG capsule TAKE (1) CAPSULE TWICE DAILY.   guaiFENesin (MUCINEX) 600 MG 12 hr tablet Take 1 tablet (600 mg total) by mouth 2 (two) times daily.   guaiFENesin-dextromethorphan (ROBITUSSIN DM) 100-10 MG/5ML syrup Take 5 mLs by mouth every 4 (four) hours as needed for cough.   hydroquinone 4 % cream APPLY TO AFFECTED AREAS TWICE A DAY (Patient taking differently: Apply 1 application topically in the morning and at bedtime.)   loratadine (CLARITIN) 10 MG tablet TAKE ONE TABLET AT BEDTIME   mupirocin ointment (BACTROBAN) 2 % Apply 2 (two) times daily. To affected areas (Patient taking differently: Apply 1 application topically 2 (two) times daily. To affected areas)   pantoprazole (PROTONIX) 40 MG tablet Take 1 tablet (40 mg total) by mouth daily. TAKE 1 TABLET ONCE DAILY FOR REFLUX   pravastatin (PRAVACHOL) 40 MG tablet TAKE 1 TABLET EVERY DAY   sertraline (ZOLOFT) 50 MG tablet TAKE 1 TABLET DAILY   tamsulosin (FLOMAX) 0.4 MG CAPS  capsule TAKE (2) CAPSULES DAILY.   No facility-administered encounter medications on file as of 01/16/2021.    Patient Active Problem List   Diagnosis Date Noted   Benign prostatic hyperplasia with nocturia 10/20/2019   Spondylosis of lumbar region without myelopathy or radiculopathy 11/10/2017   Gastroesophageal reflux disease with esophagitis 06/11/2017   Hernia of abdominal cavity 08/16/2015   Essential hypertension 08/04/2015   Inguinal  hernia 08/04/2015   Snoring 02/04/2014   Emphysema lung (HCC) 12/02/2013   Systolic dysfunction 11/11/2013   Diastolic dysfunction 11/11/2013   PVC's (premature ventricular contractions) 10/20/2013   Mixed hyperlipidemia 10/20/2013    Conditions to be addressed/monitored:CHF, HTN, and COPD  Care Plan : RNCM: Heart Failure (Adult)  Updates made by [name], RN since 01/16/2021 12:00 AM     Problem: Heart Failure   Priority: Medium     Long-Range Goal: Heart Failure Managed   Start Date: 10/27/2020  This Visit's Progress: Not on track  Recent Progress: On track  Priority: Medium  Note:   Current Barriers:  Chronic Disease Management support and education needs related to heart failure Lacks social connections Does not do daily weights  Nurse Case Manager Clinical Goal(s):  patient will work with cardiologist to address needs related to medical management of heart failure patient will meet with RN Care Manager to address self-management of heart failure Patient will demonstrate improved self-health management by weighing and recording weight each morning and by calling cardiologist with a weight gain of more than 3 lbs in 24 hours or 5 lbs in one week  Interventions:  1:1 collaboration with [name], MD regarding development and update of comprehensive plan of care as evidenced by provider attestation and co-signature Inter-disciplinary care team collaboration (see longitudinal plan of care) Chart reviewed including recent cardiology and PCP office notes, hospital notes, lab results, and imaging reports Reviewed and discussed medications Reinforced need for daily weights each morning after urinating and to call cardiologist with any weight gain of more than 3 lbs overnight or 5 lbs in one week Discussed HPI with patient No lower extremity swelling, SOB, or orthopnea Feels well at present Reinforced need for low sodium, DASH, diet Reviewed upcoming  appointments Encouraged patient to reach out to cardiologist or seek medical attention for any symptoms of worsening heart failure Encouraged patient to reach out to RN Care Manager as needed  Patient Goals/Self-Care Activities Over the next 90 days, patient will: Take medication as prescribed Weight each morning after urinating Call Cardiologist with any weight gain of more than 3 lbs overnight or more than 5 lbs in one week Call PCP or cardiologist or seek appropriate medical attention for any new or worsening symptoms Keep all medical appointments Follow a low sodium, DASH, diet Call RN Care Manager as needed [phone]  Follow Up Plan:  Telephone follow up appointment with care management team member scheduled for: 02/23/21 with RNCM The patient has been provided with contact information for the care management team and has been advised to call with any health related questions or concerns.  Next PCP appointment scheduled for: 04/19/21 with Dr Darlyn Read Next cardiology appointment scheduled for: 04/13/21 with Dr Wyline Mood     Care Plan : RNCM: COPD (Adult)  Updates made by [name], RN since 01/16/2021 12:00 AM     Problem: Respiratory Disorder   Priority: Medium     Long-Range Goal: Management of COPD   This Visit's Progress: On track  Recent Progress: On track  Priority: Medium  Note:   Current Barriers:  Chronic Disease Management support and education needs related to COPD Lacks social connections  Nurse Case Manager Clinical Goal(s):  patient will work with PCP to address needs related to medical management of COPD patient will meet with RN Care Manager to address self-management of COPD the patient will demonstrate ongoing self health care management ability as evidenced by monitoring for signs/symptoms of COPD exacerbation and by timely utilizing appropriate interventions *  Interventions:  1:1 collaboration with [name], MD regarding development and  update of comprehensive plan of care as evidenced by provider attestation and co-signature Inter-disciplinary care team collaboration (see longitudinal plan of care) Evaluation of current treatment plan related to COPD and patient's adherence to plan as established by provider. Chart reviewed including relevant notes, lab results, and imaging reports Discussed self-management of COPD Discussed immunizations Has had 3 covid vaccines Recommended 2nd Covid booster Discussed importance of booster and potential side effects Has had pneumovax and prevnar Reminded to talk with PCP about whether another dose of Pneumovax if appropriate in Sept 2022 Reviewed and discussed medications and importance of compliance Reinforced need to increase activity level as tolerated with an ultimate goal of at least 150 minutes a week Provided with RN Care Manager contact information and encouraged to reach out as needed  Patient Goals/Self-Care Activities Over the next 90 days, patient will: Take mediations as prescribed Monitor for signs/symptoms of COPD exacerbation Call PCP office/provider on-call [phone]) or seek appropriate medical attention for any signs of exacerbation Talk with PCP regarding need for Pneumovax vaccine at next office visit Schedule 2nd Covid booster any time now Call RN Care Manager as needed [phone]  Follow Up Plan:  Telephone follow up appointment with care management team member scheduled for: 02/23/21 with [facility] The patient has been provided with contact information for the care management team and has been advised to call with any health related questions or concerns.  Next PCP appointment scheduled for: 04/19/21 with Dr Darlyn Read Next cardiology appointment scheduled for: 04/13/21 with Dr Wyline Mood     Care Plan : RNCM: Hypertension (Adult)  Updates made by [name], RN since 01/16/2021 12:00 AM     Problem: Hypertension (Hypertension)   Priority: Medium      Long-Range Goal: Hypertension Management   This Visit's Progress: On track  Recent Progress: On track  Priority: Medium  Note:   Objective: BP Readings from Last 3 Encounters:  10/19/20 119/63  09/22/20 116/64  08/05/20 133/81   Current Barriers:  Chronic Disease Management support and education needs related to hypertension in a patient with heart failure and COPD  Nurse Case Manager Clinical Goal(s):  patient will work with PCP and cardiologist to address needs related to Medical management of hypertension patient will meet with RN Care Manager to address self-management of hypertension Patient will demonstrate continued self-care management as evidenced by checking and recording blood pressure at least 3 times per week  Interventions:  1:1 collaboration with [name], MD regarding development and update of comprehensive plan of care as evidenced by provider attestation and co-signature Inter-disciplinary care team collaboration (see longitudinal plan of care) Evaluation of current treatment plan related to hypertension and patient's adherence to plan as established by provider. Chart reviewed including relevant office notes, lab results, and hospital reports Reviewed and discussed medications and importance of compliance Encouraged patient to continue checking and recording blood pressure at least 3 times per week  Encouraged patient to reach out  to cardiologist or PCP with any readings outside of recommended range Advised to take log to PCP and cardiology appointments Assessed for recent episodes of orthostatic hypotension Reviewed upcoming appointments with cardiologist and PCP in Sept 2022 Provided with RN Care Manager contact number and encouraged to reach out as needed  Patient Goals/Self-Care Activities Over the next 90 days, patient will: Take blood pressure at least 3 times per week Record blood pressure, pulse, and time of day in a log Call cardiologist with  any returning symptoms of lightheadedness or dizziness Call cardiologist if you blood pressure is running lower or higher than he recommended Call RN Care Manager as needed [phone] Keep all medical appointments Take medications as prescribed Low sodium, DASH, diet  Follow Up Plan:  Telephone follow up appointment with care management team member scheduled for: 02/23/21 with [facility] The patient has been provided with contact information for the care management team and has been advised to call with any health related questions or concerns.  Next PCP appointment scheduled for: 04/19/21 with Dr Darlyn Read Next cardiology appointment scheduled for: 04/13/21 with Dr Wyline Mood     Demetrios Loll, BSN, RN-BC Embedded Chronic Care Manager [facility] Care Management Direct Dial: [phone]

## 2021-01-16 NOTE — Patient Instructions (Signed)
Visit Information  PATIENT GOALS:  Goals Addressed             This Visit's Progress    COPD Managed   On track    Timeframe:  Long-Range Goal Priority:  Medium Start Date:   10/27/20                          Expected End Date: 10/19/21                       Follow-up: 02/23/21  Take mediations as prescribed Monitor for signs/symptoms of COPD exacerbation Call PCP office/provider on-call [phone]) or seek appropriate medical attention for any signs of exacerbation Talk with PCP regarding need for Pneumovax vaccine at next office visit Schedule 2nd Covid booster any time now Call RN Care Manager as needed [phone]      Heart Failure Managed   Not on track    Timeframe:  Long-Range Goal Priority:  Medium Start Date:  07/25/2020                 Expected End Date:    09/26/2020                   Follow-up: 02/23/21 with RNCM  Take medication as prescribed Weight each morning after urinating Call Cardiologist with any weight gain of more than 3 lbs overnight or more than 5 lbs in one week Call PCP or cardiologist or seek appropriate medical attention for any new or worsening symptoms Keep all medical appointments Follow a low sodium, DASH, diet Call RN Care Manager as needed [phone]   Why is this important?   You will be able to handle your symptoms better if you keep track of them.  Making some simple changes to your lifestyle will help.  Eating healthy is one thing you can do to take good care of yourself.          Hypertension Management   On track    Timeframe:  Long-Range Goal Priority:  Medium Start Date: 09/01/20                            Expected End Date: 03/01/21                      Follow-up: 02/23/21  Take blood pressure at least 3 times per week Record blood pressure, pulse, and time of day in a log Call cardiologist with any returning symptoms of lightheadedness or dizziness Call cardiologist if you blood pressure is running lower or higher  than he recommended Call RN Care Manager as needed [phone] Keep all medical appointments Take medications as prescribed Low sodium, DASH, diet         The patient verbalized understanding of instructions, educational materials, and care plan provided today and declined offer to receive copy of patient instructions, educational materials, and care plan.   Follow Up Plan:  Telephone follow up appointment with care management team member scheduled for: 02/23/21 with [facility] The patient has been provided with contact information for the care management team and has been advised to call with any health related questions or concerns.  Next PCP appointment scheduled for: 04/19/21 with Dr Darlyn Read Next cardiology appointment scheduled for: 04/13/21 with Dr Wyline Mood  Demetrios Loll, BSN, RN-BC Embedded Chronic Care Manager [facility] / [facility] Care Management Direct Dial:  [phone]   

## 2021-01-31 ENCOUNTER — Encounter: Payer: Self-pay | Admitting: Nurse Practitioner

## 2021-01-31 ENCOUNTER — Encounter: Payer: Self-pay | Admitting: *Deleted

## 2021-01-31 ENCOUNTER — Ambulatory Visit (INDEPENDENT_AMBULATORY_CARE_PROVIDER_SITE_OTHER): Payer: Medicare Other | Admitting: *Deleted

## 2021-01-31 ENCOUNTER — Telehealth: Payer: Self-pay | Admitting: *Deleted

## 2021-01-31 ENCOUNTER — Ambulatory Visit (INDEPENDENT_AMBULATORY_CARE_PROVIDER_SITE_OTHER): Payer: Medicare Other | Admitting: Nurse Practitioner

## 2021-01-31 VITALS — BP 160/72 | HR 92 | Temp 99.1°F | Ht 68.0 in | Wt 182.0 lb

## 2021-01-31 DIAGNOSIS — I10 Essential (primary) hypertension: Secondary | ICD-10-CM

## 2021-01-31 DIAGNOSIS — J438 Other emphysema: Secondary | ICD-10-CM

## 2021-01-31 DIAGNOSIS — R509 Fever, unspecified: Secondary | ICD-10-CM | POA: Diagnosis not present

## 2021-01-31 DIAGNOSIS — R059 Cough, unspecified: Secondary | ICD-10-CM

## 2021-01-31 MED ORDER — BENZONATATE 100 MG PO CAPS: 100.0000 mg | ORAL_CAPSULE | Freq: Three times a day (TID) | ORAL | 0 refills | Status: DC | PRN

## 2021-01-31 NOTE — Patient Instructions (Signed)
COVID-19: What to Do if You Are Sick CDC has updated isolation and quarantine recommendations for the public, and is revising the CDC website to reflect these changes. These recommendations do not apply to healthcare personnel and do not supersede state, local, tribal, or territorial laws, rules, andregulations. If you have a fever, cough or other symptoms, you might have COVID-19. Most people have mild illness and are able to recover at home. If you are sick: Keep track of your symptoms. If you have an emergency warning sign (including trouble breathing), call 911. Steps to help prevent the spread of COVID-19 if you are sick If you are sick with COVID-19 or think you might have COVID-19, follow the steps below to care for yourself and to help protect other peoplein your home and community. Stay home except to get medical care Stay home. Most people with COVID-19 have mild illness and can recover at home without medical care. Do not leave your home, except to get medical care. Do not visit public areas. Take care of yourself. Get rest and stay hydrated. Take over-the-counter medicines, such as acetaminophen, to help you feel better. Stay in touch with your doctor. Call before you get medical care. Be sure to get care if you have trouble breathing, or have any other emergency warning signs, or if you think it is an emergency. Avoid public transportation, ride-sharing, or taxis. Separate yourself from other people As much as possible, stay in a specific room and away from other people and pets in your home. If possible, you should use a separate bathroom. If you need to be around other people or animals in oroutside of the home, wear a mask. Tell your close contactsthat they may have been exposed to COVID-19. An infected person can spread COVID-19 starting 48 hours (or 2 days) before the person has any symptoms or tests positive. By letting your close contacts know they may have been exposed to COVID-19,  you are helping to protect everyone. Additional guidance is available for those living in close quarters and shared housing. See COVID-19 and Animals if you have questions about pets. If you are diagnosed with COVID-19, someone from the health department may call you. Answer the call to slow the spread. Monitor your symptoms Symptoms of COVID-19 include fever, cough, or other symptoms. Follow care instructions from your healthcare provider and local health department. Your local health authorities may give instructions on checking your symptoms and reporting information. When to seek emergency medical attention Look for emergency warning signs* for COVID-19. If someone is showing any of these signs, seek emergency medical care immediately: Trouble breathing Persistent pain or pressure in the chest New confusion Inability to wake or stay awake Pale, gray, or blue-colored skin, lips, or nail beds, depending on skin tone *This list is not all possible symptoms. Please call your medical provider forany other symptoms that are severe or concerning to you. Call 911 or call ahead to your local emergency facility: Notify the operator that you are seeking care for someone who has or may haveCOVID-19. Call ahead before visiting your doctor Call ahead. Many medical visits for routine care are being postponed or done by phone or telemedicine. If you have a medical appointment that cannot be postponed, call your doctor's office, and tell them you have or may have COVID-19. This will help the office protect themselves and other patients. Get tested If you have symptoms of COVID-19, get tested. While waiting for test results, you stay away from others,   including staying apart from those living in your household. Self-tests are one of several options for testing for the virus that causes COVID-19 and may be more convenient than laboratory-based tests and point-of-care tests. Ask your healthcare provider or  your local health department if you need help interpreting your test results. You can visit your state, tribal, local, and territorial health department's website to look for the latest local information on testing sites. If you are sick, wear a mask over your nose and mouth You should wear a mask over your nose and mouth if you must be around other people or animals, including pets (even at home). You don't need to wear the mask if you are alone. If you can't put on a mask (because of trouble breathing, for example), cover your coughs and sneezes in some other way. Try to stay at least 6 feet away from other people. This will help protect the people around you. Masks should not be placed on young children under age 2 years, anyone who has trouble breathing, or anyone who is not able to remove the mask without help. Note: During the COVID-19 pandemic, medical grade facemasks are reserved forhealthcare workers and some first responders. Cover your coughs and sneezes Cover your mouth and nose with a tissue when you cough or sneeze. Throw away used tissues in a lined trash can. Immediately wash your hands with soap and water for at least 20 seconds. If soap and water are not available, clean your hands with an alcohol-based hand sanitizer that contains at least 60% alcohol. Clean your hands often Wash your hands often with soap and water for at least 20 seconds. This is especially important after blowing your nose, coughing, or sneezing; going to the bathroom; and before eating or preparing food. Use hand sanitizer if soap and water are not available. Use an alcohol-based hand sanitizer with at least 60% alcohol, covering all surfaces of your hands and rubbing them together until they feel dry. Soap and water are the best option, especially if hands are visibly dirty. Avoid touching your eyes, nose, and mouth with unwashed hands. Handwashing Tips Avoid sharing personal household items Do not share  dishes, drinking glasses, cups, eating utensils, towels, or bedding with other people in your home. Wash these items thoroughly after using them with soap and water or put in the dishwasher. Clean all "high-touch" surfaces every day Clean and disinfect high-touch surfaces in your "sick room" and bathroom; wear disposable gloves. Let someone else clean and disinfect surfaces in common areas, but you should clean your bedroom and bathroom, if possible. If a caregiver or other person needs to clean and disinfect a sick person's bedroom or bathroom, they should do so on an as-needed basis. The caregiver/other person should wear a mask and disposable gloves prior to cleaning. They should wait as long as possible after the person who is sick has used the bathroom before coming in to clean and use the bathroom. High-touch surfaces include phones, remote controls, counters, tabletops, doorknobs, bathroom fixtures, toilets, keyboards, tablets, and bedside tables. Clean and disinfect areas that may have blood, stool, or body fluids on them. Use household cleaners and disinfectants. Clean the area or item with soap and water or another detergent if it is dirty. Then, use a household disinfectant. Be sure to follow the instructions on the label to ensure safe and effective use of the product. Many products recommend keeping the surface wet for several minutes to ensure germs are killed. Many   also recommend precautions such as wearing gloves and making sure you have good ventilation during use of the product. Use a product from EPA's List N: Disinfectants for Coronavirus (COVID-19). Complete Disinfection Guidance When you can be around others after being sick with COVID-19 Deciding when you can be around others is different for different situations. Find out when you can safely end home isolation. For any additional questions about your care,contact your healthcare provider or state or local health  department. 07/06/2020 Content source: National Center for Immunization and Respiratory Diseases (NCIRD), Division of Viral Diseases This information is not intended to replace advice given to you by your health care provider. Make sure you discuss any questions you have with your healthcare provider. Document Revised: 09/02/2020 Document Reviewed: 09/02/2020 Elsevier Patient Education  2022 [employer]  

## 2021-01-31 NOTE — Chronic Care Management (AMB) (Signed)
Chronic Care Management   CCM RN Visit Note  01/31/2021 Name: Taylor Cross MRN: [id number] DOB: [birth year]  Subjective: Taylor Cross is a 75 y.o. year old male who is a primary care patient of [name], [name], MD. The care management team was consulted for assistance with disease management and care coordination needs.    Engaged with patient by telephone for follow up visit in response to provider referral for case management and/or care coordination services.   Consent to Services:  The patient was given information about Chronic Care Management services, agreed to services, and gave verbal consent prior to initiation of services.  Please see initial visit note for detailed documentation.   Patient agreed to services and verbal consent obtained.   Assessment: Review of patient past medical history, allergies, medications, health status, including review of consultants reports, laboratory and other test data, was performed as part of comprehensive evaluation and provision of chronic care management services.   SDOH (Social Determinants of Health) assessments and interventions performed:    CCM Care Plan  Allergies  Allergen Reactions   Neosporin [Neomycin-Bacitracin Zn-Polymyx] Rash   Voltaren [Diclofenac Sodium] Other (See Comments)    GI upset    Outpatient Encounter Medications as of 01/31/2021  Medication Sig   acetaminophen (TYLENOL) 325 MG tablet Take 2 tablets (650 mg total) by mouth every 6 (six) hours as needed for mild pain, fever or headache (or Fever >/= 101).   allopurinol (ZYLOPRIM) 100 MG tablet Take 1 tablet (100 mg total) by mouth daily.   aspirin 81 MG chewable tablet Chew 1 tablet (81 mg total) by mouth daily with breakfast.   carvedilol (COREG) 12.5 MG tablet Take 1 tablet (12.5 mg total) by mouth 2 (two) times daily.   EQ ASPIRIN ADULT LOW DOSE 81 MG EC tablet Take 81 mg by mouth daily.   finasteride (PROSCAR) 5 MG tablet Take 1 tablet (5 mg total) by mouth  daily.   fluticasone (FLONASE) 50 MCG/ACT nasal spray Place 2 sprays into both nostrils daily.   gabapentin (NEURONTIN) 300 MG capsule TAKE (1) CAPSULE TWICE DAILY.   guaiFENesin (MUCINEX) 600 MG 12 hr tablet Take 1 tablet (600 mg total) by mouth 2 (two) times daily.   guaiFENesin-dextromethorphan (ROBITUSSIN DM) 100-10 MG/5ML syrup Take 5 mLs by mouth every 4 (four) hours as needed for cough.   hydroquinone 4 % cream APPLY TO AFFECTED AREAS TWICE A DAY (Patient taking differently: Apply 1 application topically in the morning and at bedtime.)   loratadine (CLARITIN) 10 MG tablet TAKE ONE TABLET AT BEDTIME   mupirocin ointment (BACTROBAN) 2 % Apply 2 (two) times daily. To affected areas (Patient taking differently: Apply 1 application topically 2 (two) times daily. To affected areas)   pantoprazole (PROTONIX) 40 MG tablet Take 1 tablet (40 mg total) by mouth daily. TAKE 1 TABLET ONCE DAILY FOR REFLUX   pravastatin (PRAVACHOL) 40 MG tablet TAKE 1 TABLET EVERY DAY   sertraline (ZOLOFT) 50 MG tablet TAKE 1 TABLET DAILY   tamsulosin (FLOMAX) 0.4 MG CAPS capsule TAKE (2) CAPSULES DAILY.   No facility-administered encounter medications on file as of 01/31/2021.    Patient Active Problem List   Diagnosis Date Noted   Benign prostatic hyperplasia with nocturia 10/20/2019   Spondylosis of lumbar region without myelopathy or radiculopathy 11/10/2017   Gastroesophageal reflux disease with esophagitis 06/11/2017   Hernia of abdominal cavity 08/16/2015   Essential hypertension 08/04/2015   Inguinal hernia 08/04/2015   Snoring 02/04/2014  Emphysema lung (HCC) 12/02/2013   Systolic dysfunction 11/11/2013   Diastolic dysfunction 11/11/2013   PVC's (premature ventricular contractions) 10/20/2013   Mixed hyperlipidemia 10/20/2013    Conditions to be addressed/monitored:COPD & HTN  Care Plan : RNCM: COPD (Adult)  Updates made by [name], RN since 01/31/2021 12:00 AM     Problem: Respiratory  Disorder   Priority: Medium     Long-Range Goal: Management of COPD   This Visit's Progress: On track  Recent Progress: On track  Priority: Medium  Note:   Current Barriers:  Chronic Disease Management support and education needs related to COPD in a patient with HTN and diastolic and systolic dysfunction Lacks social connections  Nurse Case Manager Clinical Goal(s):  patient will work with PCP to address needs related to medical management of COPD patient will meet with RN Care Manager to address self-management of COPD the patient will demonstrate ongoing self health care management ability as evidenced by monitoring for signs/symptoms of COPD exacerbation and by timely utilizing appropriate interventions *  Interventions:  1:1 collaboration with [name], MD regarding development and update of comprehensive plan of care as evidenced by provider attestation and co-signature Inter-disciplinary care team collaboration (see longitudinal plan of care) Evaluation of current treatment plan related to COPD and patient's adherence to plan as established by provider. Chart reviewed including relevant notes, lab results, and imaging reports Reviewed and discussed medications and usage Discussed self-management of COPD and early recognition of exacerbation symptoms Discussed current symptoms of cough, chest congestion, and temp of 99.9 Patient feels similar to how he felt prior to hospitalization in December Collaborated with [facility] front office staff regarding office visit Reviewed appointment with [facility] Provider for this afternoon in the [facility] Provided praise and encouragement for reaching out regarding early symptoms of exacerbation and for not waiting until they worsened  Provided with RN Care Manager contact information and encouraged to reach out as needed  Patient Goals/Self-Care Activities Over the next 90 days, patient will: Take mediations as prescribed Monitor for  signs/symptoms of COPD exacerbation Call PCP office/provider on-call [phone]) or seek appropriate medical attention for any signs of exacerbation Talk with PCP regarding need for Pneumovax vaccine at next office visit Schedule 2nd Covid booster any time now Keep appointment at [facility] on 01/31/21 for evaluation of acute on chronic COPD symptoms Call RN Care Manager as needed [phone]  Follow Up Plan:  Telephone follow up appointment with care management team member scheduled for: 02/23/21 with [facility] The patient has been provided with contact information for the care management team and has been advised to call with any health related questions or concerns.       Plan:Telephone follow up appointment with care management team member scheduled for:  02/23/21 with RNCM and appointment with [facility] provider scheduled for this afternoon for evaluation of acute on chronic symptoms  Demetrios Loll, BSN, RN-BC Embedded Chronic Care Manager [facility] Care Management Direct Dial: [phone]

## 2021-01-31 NOTE — Progress Notes (Signed)
   Subjective:    Patient ID: Taylor Cross, male    DOB: 05/17/1946, 75 y.o.   MRN: [id number]  Chief Complaint: Cough (Sneezing, rattle in chest, low grade fever  - started Sat )   HPI Patient comes in today c/o cough and sneezing that started last night. [place] Spaniel it he sounded like he had rattling in his chest. Has fever 99.8 last night.     Review of Systems  Constitutional:  Positive for fever (low grade 99.8).  HENT:  Positive for congestion, postnasal drip and rhinorrhea. Negative for sinus pressure, sinus pain and sore throat.   Respiratory:  Positive for cough. Negative for shortness of breath and wheezing.   Musculoskeletal:  Negative for myalgias.  Neurological:  Negative for dizziness and headaches.      Objective:   Physical Exam Vitals and nursing note reviewed.  Constitutional:      Appearance: Normal appearance. He is obese.  Cardiovascular:     Rate and Rhythm: Normal rate and regular rhythm.     Heart sounds: Normal heart sounds.  Pulmonary:     Effort: Pulmonary effort is normal.     Breath sounds: Normal breath sounds.  Skin:    General: Skin is warm.  Neurological:     General: No focal deficit present.     Mental Status: He is alert and oriented to person, place, and time.  Psychiatric:        Mood and Affect: Mood normal.        Behavior: Behavior normal.    BP (!) 160/72   Pulse 92   Temp 99.1 F (37.3 C)   Ht 5\' 8"  (1.727 m)   Wt 182 lb (82.6 kg)   SpO2 97%   BMI 27.67 kg/m        Assessment & Plan:   Taylor Cross in today with chief complaint of Cough (Sneezing, rattle in chest, low grade fever  - started Sat )   1. Fever, unspecified fever cause - Novel Coronavirus, NAA ([facility])  2. Cough Covid test pending Force fluids Rest Motrin or tylenol for body aches or fever Covid test pending- quarantine until test results are  back - Novel Coronavirus, NAA ([facility])    The above assessment and management plan was discussed with  the patient. The patient verbalized understanding of and has agreed to the management plan. Patient is aware to call the clinic if symptoms persist or worsen. Patient is aware when to return to the clinic for a follow-up visit. Patient educated on when it is appropriate to go to the emergency department.   Mary-Margaret Mon, FNP

## 2021-01-31 NOTE — Patient Instructions (Signed)
Visit Information  PATIENT GOALS:  Goals Addressed             This Visit's Progress    COPD Managed   On track    Timeframe:  Long-Range Goal Priority:  Medium Start Date:   10/27/20                          Expected End Date: 10/19/21                       Follow-up: 02/23/21  Take mediations as prescribed Monitor for signs/symptoms of COPD exacerbation Call PCP office/provider on-call [phone]) or seek appropriate medical attention for any signs of exacerbation Talk with PCP regarding need for Pneumovax vaccine at next office visit Schedule 2nd Covid booster any time now Keep appointment at [facility] on 01/31/21 for evaluation of acute on chronic COPD symptoms Call RN Care Manager as needed [phone]         Patient verbalizes understanding of instructions provided today and agrees to view in MyChart.     Plan:Telephone follow up appointment with care management team member scheduled for:  02/23/21 with RNCM and appointment with [facility] provider scheduled for this afternoon for evaluation of acute on chronic symptoms   Demetrios Loll, BSN, RN-BC Embedded Chronic Care Manager [facility] Care Management Direct Dial: [phone]

## 2021-02-01 LAB — SARS-COV-2, NAA 2 DAY TAT

## 2021-02-01 LAB — NOVEL CORONAVIRUS, NAA: SARS-CoV-2, NAA: NOT DETECTED

## 2021-02-01 NOTE — Telephone Encounter (Signed)
Erroneous Encounter. Please disregard.    

## 2021-02-02 ENCOUNTER — Telehealth: Payer: Self-pay | Admitting: *Deleted

## 2021-02-02 NOTE — Telephone Encounter (Signed)
MMM already seen for this - forwarding to her

## 2021-02-02 NOTE — Telephone Encounter (Signed)
Pt. Needs to be seen for this. Thanks, WS 

## 2021-02-02 NOTE — Telephone Encounter (Signed)
02/02/2021  Incoming call regarding acute illness. Visit on 01/31/21 for cough and congestion. Negative covid test. Now complains of nasal congestion with white discharge, pain/pressure around eyes, and post nasal drainage. Believes cough is from post nasal drainage and tessalon perles aren't helping. Cough is worse at night. States that it feels like prior sinus infections and he questions if he needs an antibiotic. He is using several nasal sprays daily but is not using anything else OTC.  Forwarding to PCP for review and response regarding this acute issue.  Demetrios Loll, BSN, RN-BC Embedded Chronic Care Manager [facility] Care Management Direct Dial: [phone]

## 2021-02-03 MED ORDER — AMOXICILLIN-POT CLAVULANATE 875-125 MG PO TABS: 1.0000 | ORAL_TABLET | Freq: Two times a day (BID) | ORAL | 0 refills | Status: DC

## 2021-02-03 NOTE — Telephone Encounter (Signed)
Antibiotic sent to [facility]  Meds ordered this encounter  Medications   amoxicillin-clavulanate (AUGMENTIN) 875-125 MG tablet    Sig: Take 1 tablet by mouth 2 (two) times daily.    Dispense:  14 tablet    Refill:  0    Order Specific Question:   Supervising Provider    Answer:   Arville Care A [id number]

## 2021-02-03 NOTE — Telephone Encounter (Signed)
Pt aware.

## 2021-02-23 ENCOUNTER — Ambulatory Visit: Payer: Medicare Other | Admitting: *Deleted

## 2021-02-23 DIAGNOSIS — J438 Other emphysema: Secondary | ICD-10-CM

## 2021-02-23 DIAGNOSIS — I10 Essential (primary) hypertension: Secondary | ICD-10-CM | POA: Diagnosis not present

## 2021-02-23 DIAGNOSIS — I5042 Chronic combined systolic (congestive) and diastolic (congestive) heart failure: Secondary | ICD-10-CM | POA: Diagnosis not present

## 2021-02-23 NOTE — Patient Instructions (Signed)
Visit Information  PATIENT GOALS:  Goals Addressed             This Visit's Progress    COPD Managed   On track    Timeframe:  Long-Range Goal Priority:  Medium Start Date:   10/27/20                          Expected End Date: 10/19/21                       Follow-up: 03/27/21  Take mediations as prescribed Monitor for signs/symptoms of COPD exacerbation Call PCP office/provider on-call [phone]) or seek appropriate medical attention for any signs of exacerbation Talk with PCP regarding need for Pneumovax vaccine at next office visit Schedule 2nd Covid booster any time now Keep appointment at [facility] on 01/31/21 for evaluation of acute on chronic COPD symptoms Call RN Care Manager as needed [phone]     Heart Failure Managed   Not on track    Timeframe:  Long-Range Goal Priority:  Medium Start Date:  07/25/2020                 Expected End Date:    09/26/2020                   Follow-up: 03/27/21 with RNCM  Take medication as prescribed Weigh each morning after urinating Call Cardiologist with any weight gain of more than 3 lbs overnight or more than 5 lbs in one week Call PCP or cardiologist or seek appropriate medical attention for any new or worsening symptoms Keep all medical appointments Follow a low sodium, DASH, diet Call RN Care Manager as needed [phone]   Why is this important?   You will be able to handle your symptoms better if you keep track of them.  Making some simple changes to your lifestyle will help.  Eating healthy is one thing you can do to take good care of yourself.         Hypertension Management   On track    Timeframe:  Long-Range Goal Priority:  Medium Start Date: 09/01/20                            Expected End Date: 03/01/21                      Follow-up: 03/27/21  Take blood pressure at least 3 times per week Record blood pressure, pulse, and time of day in a log Call cardiologist with any returning symptoms of lightheadedness  or dizziness Call cardiologist if you blood pressure is running lower or higher than he recommended Call RN Care Manager as needed [phone] Keep all medical appointments Take medications as prescribed Low sodium, DASH, diet        Patient verbalizes understanding of instructions provided today and agrees to view in MyChart.   Plan: Telephone follow up appointment with care management team member scheduled for: 03/27/21 with RNCM The patient has been provided with contact information for the care management team and has been advised to call with any health related questions or concerns.   Demetrios Loll, BSN, RN-BC Embedded Chronic Care Manager [facility] Care Management Direct Dial: [phone]

## 2021-02-23 NOTE — Chronic Care Management (AMB) (Signed)
Chronic Care Management   CCM RN Visit Note  02/23/2021 Name: Taylor Cross MRN: [id number] DOB: Nov 25, 1945  Subjective: MCKAY Cross is a 75 y.o. year old male who is a primary care patient of [name], [name], MD. The care management team was consulted for assistance with disease management and care coordination needs.    Engaged with patient by telephone for follow up visit in response to provider referral for case management and/or care coordination services.   Consent to Services:  The patient was given information about Chronic Care Management services, agreed to services, and gave verbal consent prior to initiation of services.  Please see initial visit note for detailed documentation.   Patient agreed to services and verbal consent obtained.   Assessment: Review of patient past medical history, allergies, medications, health status, including review of consultants reports, laboratory and other test data, was performed as part of comprehensive evaluation and provision of chronic care management services.   SDOH (Social Determinants of Health) assessments and interventions performed:    CCM Care Plan  Allergies  Allergen Reactions   Neosporin [Neomycin-Bacitracin Zn-Polymyx] Rash   Voltaren [Diclofenac Sodium] Other (See Comments)    GI upset    Outpatient Encounter Medications as of 02/23/2021  Medication Sig   acetaminophen (TYLENOL) 325 MG tablet Take 2 tablets (650 mg total) by mouth every 6 (six) hours as needed for mild pain, fever or headache (or Fever >/= 101).   allopurinol (ZYLOPRIM) 100 MG tablet Take 1 tablet (100 mg total) by mouth daily.   amoxicillin-clavulanate (AUGMENTIN) 875-125 MG tablet Take 1 tablet by mouth 2 (two) times daily.   aspirin 81 MG chewable tablet Chew 1 tablet (81 mg total) by mouth daily with breakfast.   benzonatate (TESSALON) 100 MG capsule Take 1 capsule (100 mg total) by mouth 3 (three) times daily as needed for cough.   carvedilol  (COREG) 12.5 MG tablet Take 1 tablet (12.5 mg total) by mouth 2 (two) times daily.   EQ ASPIRIN ADULT LOW DOSE 81 MG EC tablet Take 81 mg by mouth daily. (Patient not taking: Reported on 01/31/2021)   finasteride (PROSCAR) 5 MG tablet Take 1 tablet (5 mg total) by mouth daily.   fluticasone (FLONASE) 50 MCG/ACT nasal spray Place 2 sprays into both nostrils daily.   gabapentin (NEURONTIN) 300 MG capsule TAKE (1) CAPSULE TWICE DAILY.   guaiFENesin (MUCINEX) 600 MG 12 hr tablet Take 1 tablet (600 mg total) by mouth 2 (two) times daily.   guaiFENesin-dextromethorphan (ROBITUSSIN DM) 100-10 MG/5ML syrup Take 5 mLs by mouth every 4 (four) hours as needed for cough. (Patient not taking: Reported on 01/31/2021)   hydroquinone 4 % cream APPLY TO AFFECTED AREAS TWICE A DAY (Patient not taking: Reported on 01/31/2021)   loratadine (CLARITIN) 10 MG tablet TAKE ONE TABLET AT BEDTIME   mupirocin ointment (BACTROBAN) 2 % Apply 2 (two) times daily. To affected areas (Patient not taking: Reported on 01/31/2021)   pantoprazole (PROTONIX) 40 MG tablet Take 1 tablet (40 mg total) by mouth daily. TAKE 1 TABLET ONCE DAILY FOR REFLUX   pravastatin (PRAVACHOL) 40 MG tablet TAKE 1 TABLET EVERY DAY   sertraline (ZOLOFT) 50 MG tablet TAKE 1 TABLET DAILY   tamsulosin (FLOMAX) 0.4 MG CAPS capsule TAKE (2) CAPSULES DAILY.   No facility-administered encounter medications on file as of 02/23/2021.    Patient Active Problem List   Diagnosis Date Noted   Benign prostatic hyperplasia with nocturia 10/20/2019   Spondylosis of  lumbar region without myelopathy or radiculopathy 11/10/2017   Gastroesophageal reflux disease with esophagitis 06/11/2017   Hernia of abdominal cavity 08/16/2015   Essential hypertension 08/04/2015   Inguinal hernia 08/04/2015   Snoring 02/04/2014   Emphysema lung (HCC) 12/02/2013   Systolic dysfunction 11/11/2013   Diastolic dysfunction 11/11/2013   PVC's (premature ventricular contractions) 10/20/2013    Mixed hyperlipidemia 10/20/2013    Conditions to be addressed/monitored:CHF, HTN, and COPD  Care Plan : RNCM: Heart Failure (Adult)  Updates made by [name], RN since 02/23/2021 12:00 AM     Problem: Heart Failure   Priority: Medium     Long-Range Goal: Heart Failure Managed   Start Date: 10/27/2020  This Visit's Progress: Not on track  Recent Progress: Not on track  Priority: Medium  Note:   Current Barriers:  Chronic Disease Management support and education needs related to heart failure Lacks social connections Does not do daily weights  Nurse Case Manager Clinical Goal(s):  patient will work with cardiologist to address needs related to medical management of heart failure patient will meet with RN Care Manager to address self-management of heart failure Patient will demonstrate improved self-health management by weighing and recording weight each morning and by calling cardiologist with a weight gain of more than 3 lbs in 24 hours or 5 lbs in one week  Interventions:  1:1 collaboration with [name], MD regarding development and update of comprehensive plan of care as evidenced by provider attestation and co-signature Inter-disciplinary care team collaboration (see longitudinal plan of care) Chart reviewed including recent cardiology and PCP office notes, hospital notes, lab results, and imaging reports Reviewed and discussed medications Reinforced need for daily weights each morning after urinating and to call cardiologist with any weight gain of more than 3 lbs overnight or 5 lbs in one week Discussed HPI with patient No lower extremity swelling, SOB, or orthopnea Feels well at present Reinforced need for low sodium, DASH, diet Reviewed upcoming appointments: Dr Wyline Mood 04/13/21 Encouraged patient to reach out to cardiologist or seek medical attention for any symptoms of worsening heart failure Encouraged patient to reach out to RN Care Manager as  needed  Patient Goals/Self-Care Activities Over the next 90 days, patient will: Take medication as prescribed Weigh each morning after urinating Call Cardiologist with any weight gain of more than 3 lbs overnight or more than 5 lbs in one week Call PCP or cardiologist or seek appropriate medical attention for any new or worsening symptoms Keep all medical appointments Follow a low sodium, DASH, diet Call RN Care Manager as needed [phone]     Care Plan : RNCM: COPD (Adult)  Updates made by [name], RN since 02/23/2021 12:00 AM     Problem: Respiratory Disorder   Priority: Medium     Long-Range Goal: Management of COPD   This Visit's Progress: On track  Recent Progress: On track  Priority: Medium  Note:   Current Barriers:  Chronic Disease Management support and education needs related to COPD in a patient with HTN and diastolic and systolic dysfunction Lacks social connections  Nurse Case Manager Clinical Goal(s):  patient will work with PCP to address needs related to medical management of COPD patient will meet with RN Care Manager to address self-management of COPD the patient will demonstrate ongoing self health care management ability as evidenced by monitoring for signs/symptoms of COPD exacerbation and by timely utilizing appropriate interventions *  Interventions:  1:1 collaboration with [name], MD  regarding development and update of comprehensive plan of care as evidenced by provider attestation and co-signature Inter-disciplinary care team collaboration (see longitudinal plan of care) Evaluation of current treatment plan related to COPD and patient's adherence to plan as established by provider. Reviewed scheduled/upcoming provider appointments including: 04/19/21 with Dr Darlyn Read Discussed plans with patient for ongoing care management follow up and provided patient with direct contact information for care management team Chart reviewed including  relevant notes, lab results, and imaging reports Reviewed and discussed medications and usage Discussed symptoms of infection prior this month. Patient reports that symptoms have resolved and that he feels well.  Praised for early recognition of worsening symptoms during last acute illness and encouraged patient to seek timely and appropriate medical care as needed Provided with RN Care Manager contact information and encouraged to reach out as needed  Patient Goals/Self-Care Activities Over the next 90 days, patient will: Take medications as prescribed Monitor for signs/symptoms of COPD exacerbation Call PCP office/provider on-call [phone]) or seek appropriate medical attention for any signs of exacerbation Talk with PCP regarding need for Pneumovax vaccine at next office visit Call RN Care Manager as needed [phone]     Care Plan : RNCM: Hypertension (Adult)  Updates made by [name], RN since 02/23/2021 12:00 AM     Problem: Hypertension (Hypertension)   Priority: Medium     Long-Range Goal: Hypertension Management   This Visit's Progress: On track  Recent Progress: On track  Priority: Medium  Note:   Objective: BP Readings from Last 3 Encounters:  01/31/21 (!) 160/72  10/19/20 119/63  09/22/20 116/64   Current Barriers:  Chronic Disease Management support and education needs related to hypertension in a patient with heart failure and COPD  Nurse Case Manager Clinical Goal(s):  patient will work with PCP and cardiologist to address needs related to Medical management of hypertension patient will meet with RN Care Manager to address self-management of hypertension Patient will demonstrate continued self-care management as evidenced by checking and recording blood pressure at least 3 times per week  Interventions:  1:1 collaboration with [name], MD regarding development and update of comprehensive plan of care as evidenced by provider attestation  and co-signature Inter-disciplinary care team collaboration (see longitudinal plan of care) Evaluation of current treatment plan related to hypertension and patient's adherence to plan as established by provider. Chart reviewed including relevant office notes, lab results, and hospital reports Reviewed and discussed medications and importance of compliance Encouraged patient to continue checking and recording blood pressure at least 3 times per week  Encouraged patient to reach out to cardiologist or PCP with any readings outside of recommended range Advised to take log to PCP and cardiology appointments Assessed for recent episodes of orthostatic hypotension Reviewed upcoming appointments with cardiologist and PCP in Sept 2022 Provided with RN Care Manager contact number and encouraged to reach out as needed  Patient Goals/Self-Care Activities Over the next 90 days, patient will: Take blood pressure at least 3 times per week Record blood pressure, pulse, and time of day in a log Call cardiologist with any returning symptoms of lightheadedness or dizziness Call cardiologist if you blood pressure is running lower or higher than he recommended Call RN Care Manager as needed [phone] Keep all medical appointments Take medications as prescribed Low sodium, DASH, diet      Plan: Telephone follow up appointment with care management team member scheduled for: 03/27/21 with RNCM The patient has been provided with contact information  for the care management team and has been advised to call with any health related questions or concerns.   Demetrios Loll, BSN, RN-BC Embedded Chronic Care Manager [facility] Care Management Direct Dial: [phone]

## 2021-03-15 ENCOUNTER — Other Ambulatory Visit: Payer: Self-pay | Admitting: Family Medicine

## 2021-03-27 ENCOUNTER — Telehealth: Payer: Medicare Other | Admitting: *Deleted

## 2021-03-30 ENCOUNTER — Other Ambulatory Visit: Payer: Self-pay | Admitting: Family Medicine

## 2021-04-09 ENCOUNTER — Emergency Department (HOSPITAL_COMMUNITY): Payer: Medicare Other

## 2021-04-09 ENCOUNTER — Encounter (HOSPITAL_COMMUNITY): Payer: Self-pay | Admitting: *Deleted

## 2021-04-09 ENCOUNTER — Emergency Department (HOSPITAL_COMMUNITY)
Admission: EM | Admit: 2021-04-09 | Discharge: 2021-04-10 | Disposition: A | Discharge status: Home / Self Care | Payer: Medicare Other | Attending: Emergency Medicine | Admitting: Emergency Medicine

## 2021-04-09 ENCOUNTER — Other Ambulatory Visit: Payer: Self-pay

## 2021-04-09 DIAGNOSIS — Z7982 Long term (current) use of aspirin: Secondary | ICD-10-CM | POA: Diagnosis not present

## 2021-04-09 DIAGNOSIS — R739 Hyperglycemia, unspecified: Secondary | ICD-10-CM | POA: Insufficient documentation

## 2021-04-09 DIAGNOSIS — D649 Anemia, unspecified: Secondary | ICD-10-CM | POA: Insufficient documentation

## 2021-04-09 DIAGNOSIS — I130 Hypertensive heart and chronic kidney disease with heart failure and stage 1 through stage 4 chronic kidney disease, or unspecified chronic kidney disease: Secondary | ICD-10-CM | POA: Insufficient documentation

## 2021-04-09 DIAGNOSIS — Z7951 Long term (current) use of inhaled steroids: Secondary | ICD-10-CM | POA: Diagnosis not present

## 2021-04-09 DIAGNOSIS — Z20822 Contact with and (suspected) exposure to covid-19: Secondary | ICD-10-CM | POA: Insufficient documentation

## 2021-04-09 DIAGNOSIS — E871 Hypo-osmolality and hyponatremia: Secondary | ICD-10-CM | POA: Diagnosis not present

## 2021-04-09 DIAGNOSIS — N189 Chronic kidney disease, unspecified: Secondary | ICD-10-CM | POA: Insufficient documentation

## 2021-04-09 DIAGNOSIS — R0981 Nasal congestion: Secondary | ICD-10-CM | POA: Diagnosis not present

## 2021-04-09 DIAGNOSIS — R0602 Shortness of breath: Secondary | ICD-10-CM | POA: Diagnosis present

## 2021-04-09 DIAGNOSIS — J449 Chronic obstructive pulmonary disease, unspecified: Secondary | ICD-10-CM | POA: Diagnosis not present

## 2021-04-09 DIAGNOSIS — I5040 Unspecified combined systolic (congestive) and diastolic (congestive) heart failure: Secondary | ICD-10-CM | POA: Insufficient documentation

## 2021-04-09 DIAGNOSIS — Z79899 Other long term (current) drug therapy: Secondary | ICD-10-CM | POA: Insufficient documentation

## 2021-04-09 DIAGNOSIS — R059 Cough, unspecified: Secondary | ICD-10-CM | POA: Insufficient documentation

## 2021-04-09 LAB — COMPREHENSIVE METABOLIC PANEL
ALT: 17 U/L (ref 0–44)
AST: 19 U/L (ref 15–41)
Albumin: 4 g/dL (ref 3.5–5.0)
Alkaline Phosphatase: 76 U/L (ref 38–126)
Anion gap: 5 (ref 5–15)
BUN: 18 mg/dL (ref 8–23)
CO2: 26 mmol/L (ref 22–32)
Calcium: 8.5 mg/dL — ABNORMAL LOW (ref 8.9–10.3)
Chloride: 102 mmol/L (ref 98–111)
Creatinine, Ser: 1.17 mg/dL (ref 0.61–1.24)
GFR, Estimated: >60 mL/min (ref >60)
Glucose, Bld: 119 mg/dL — ABNORMAL HIGH (ref 70–99)
Potassium: 4.2 mmol/L (ref 3.5–5.1)
Sodium: 133 mmol/L — ABNORMAL LOW (ref 135–145)
Total Bilirubin: 0.7 mg/dL (ref 0.3–1.2)
Total Protein: 7 g/dL (ref 6.5–8.1)

## 2021-04-09 LAB — CBC WITH DIFFERENTIAL/PLATELET
Abs Immature Granulocytes: 0.04 10*3/uL (ref 0.00–0.07)
Basophils Absolute: 0 10*3/uL (ref 0.0–0.1)
Basophils Relative: 1 %
Eosinophils Absolute: 0.1 10*3/uL (ref 0.0–0.5)
Eosinophils Relative: 2 %
HCT: 36.2 % — ABNORMAL LOW (ref 39.0–52.0)
Hemoglobin: 11.2 g/dL — ABNORMAL LOW (ref 13.0–17.0)
Immature Granulocytes: 1 %
Lymphocytes Relative: 19 %
Lymphs Abs: 1.4 10*3/uL (ref 0.7–4.0)
MCH: 27.5 pg (ref 26.0–34.0)
MCHC: 30.9 g/dL (ref 30.0–36.0)
MCV: 88.7 fL (ref 80.0–100.0)
Monocytes Absolute: 0.7 10*3/uL (ref 0.1–1.0)
Monocytes Relative: 9 %
Neutro Abs: 5 10*3/uL (ref 1.7–7.7)
Neutrophils Relative %: 68 %
Platelets: 207 10*3/uL (ref 150–400)
RBC: 4.08 MIL/uL — ABNORMAL LOW (ref 4.22–5.81)
RDW: 16.9 % — ABNORMAL HIGH (ref 11.5–15.5)
WBC: 7.3 10*3/uL (ref 4.0–10.5)
nRBC: 0 % (ref 0.0–0.2)

## 2021-04-09 LAB — TROPONIN I (HIGH SENSITIVITY)
Troponin I (High Sensitivity): 14 ng/L (ref <18)
Troponin I (High Sensitivity): 15 ng/L (ref <18)

## 2021-04-09 LAB — RESP PANEL BY RT-PCR (FLU A&B, COVID) ARPGX2
Influenza A by PCR: NEGATIVE
Influenza B by PCR: NEGATIVE
SARS Coronavirus 2 by RT PCR: NEGATIVE

## 2021-04-09 LAB — BRAIN NATRIURETIC PEPTIDE: B Natriuretic Peptide: 850 pg/mL — ABNORMAL HIGH (ref 0.0–100.0)

## 2021-04-09 MED ORDER — FUROSEMIDE 40 MG PO TABS: 40.0000 mg | ORAL_TABLET | Freq: Every day | ORAL | 0 refills | Status: DC

## 2021-04-09 MED ORDER — FUROSEMIDE 10 MG/ML IJ SOLN
40.0000 mg | Freq: Once | INTRAMUSCULAR | Status: AC
  Administered 2021-04-09: 40 mg via INTRAVENOUS
  Filled 2021-04-09: qty 4

## 2021-04-09 NOTE — ED Notes (Signed)
ED Provider at bedside. 

## 2021-04-09 NOTE — ED Triage Notes (Signed)
Pt c/o trouble breathing, coughing, sneezing and swallowing x several weeks, worsening over the last 2-3 days.

## 2021-04-09 NOTE — Discharge Instructions (Addendum)
Lab work imaging showed that you have fluid in your lungs, I have started you on Lasix please take as prescribed.  Please continue all home medication as prescribed.  I would like to follow-up with your cardiologist please call them tomorrow to schedule a follow-up appointment.  Come back to the emergency department if you develop chest pain, shortness of breath, severe abdominal pain, uncontrolled nausea, vomiting, diarrhea.

## 2021-04-09 NOTE — ED Notes (Signed)
Pt ambulated lowest Spo2 95. Pt stated mild labored. But stats stayed the same

## 2021-04-09 NOTE — ED Provider Notes (Signed)
[facility] EMERGENCY DEPARTMENT Provider Note   CSN: [id number] Arrival date & time: 04/09/21  1646     History Chief Complaint  Patient presents with   Shortness of Breath    Taylor Cross is a 75 y.o. male.  HPI  Patient with significant medical history of systolic CHF with an EF of 40 to 45% hyperlipidemia, CKD, BPH COPD presents to the emergency department with chief complaint of shortness of breath.  Patient states this has been going on for last couple weeks but the last 3 days has gotten worse.  He states that he feels short of breath when he lays down, he states that he begins to cough up some whitish phlegm and starts to sneeze.  He states he feels better when he sitting up and/or standing.  He also notes that he has become more short of breath on exertion, states the shortness of breath begins after he walks approximately 40 feet.  He denies associated chest pain, becoming diaphoretic, lightheaded, dizziness nausea or vomiting, denies worsening pedal edema.  He denies systemic infection like fevers or chills, denies worsening nasal congestion, sore throat, abdominal pain, nausea, vomiting, diarrhea.  He denies recent sick contacts, is up-to-date on his COVID vaccines.  He has no other complaints at this time.    Past Medical History:  Diagnosis Date   Acute respiratory failure with hypoxia and hypercapnia (HCC) 07/19/2020   Acute systolic CHF (congestive heart failure) (HCC) 07/19/2020   Combined systolic and diastolic heart failure (HCC)    Hyperlipidemia     Patient Active Problem List   Diagnosis Date Noted   Benign prostatic hyperplasia with nocturia 10/20/2019   Spondylosis of lumbar region without myelopathy or radiculopathy 11/10/2017   Gastroesophageal reflux disease with esophagitis 06/11/2017   Hernia of abdominal cavity 08/16/2015   Essential hypertension 08/04/2015   Inguinal hernia 08/04/2015   Snoring 02/04/2014   Emphysema lung (HCC) 12/02/2013    Systolic dysfunction 11/11/2013   Diastolic dysfunction 11/11/2013   PVC's (premature ventricular contractions) 10/20/2013   Mixed hyperlipidemia 10/20/2013    Past Surgical History:  Procedure Laterality Date   HERNIA REPAIR     TONSILLECTOMY         Family History  Problem Relation Age of Onset   Heart disease Mother    Diabetes Mother    Heart failure Mother    COPD Father    Heart attack Brother 60    Social History   Tobacco Use   Smoking status: Never   Smokeless tobacco: Never  Vaping Use   Vaping Use: Never used  Substance Use Topics   Alcohol use: No    Alcohol/week: 0.0 standard drinks   Drug use: No    Home Medications Prior to Admission medications   Medication Sig Start Date End Date Taking? Authorizing Provider  allopurinol (ZYLOPRIM) 100 MG tablet Take 1 tablet (100 mg total) by mouth daily. 10/19/20  Yes [name], MD  carvedilol (COREG) 12.5 MG tablet Take 1 tablet (12.5 mg total) by mouth 2 (two) times daily. 10/19/20 04/09/21 Yes [name], [name], MD  finasteride (PROSCAR) 5 MG tablet Take 1 tablet (5 mg total) by mouth daily. Patient taking differently: Take 5 mg by mouth at bedtime. 10/19/20  Yes [name], [name], MD  fluticasone (FLONASE) 50 MCG/ACT nasal spray Place 2 sprays into both nostrils daily. Patient taking differently: Place 2 sprays into both nostrils at bedtime. 10/19/20  Yes [name], MD  furosemide (LASIX) 40 MG tablet  Take 1 tablet (40 mg total) by mouth daily for 3 days. 04/09/21 04/12/21 Yes Carroll Sage, PA-C  gabapentin (NEURONTIN) 300 MG capsule TAKE (1) CAPSULE TWICE DAILY. Patient taking differently: Take 300 mg by mouth 2 (two) times daily. 12/13/20  Yes [name], [name], MD  loratadine (CLARITIN) 10 MG tablet TAKE ONE TABLET AT BEDTIME Patient taking differently: Take 10 mg by mouth at bedtime. 11/21/20  Yes [name], MD  pantoprazole (PROTONIX) 40 MG tablet Take 1 tablet (40 mg total) by mouth daily. TAKE 1  TABLET ONCE DAILY FOR REFLUX Patient taking differently: Take 40 mg by mouth daily. 10/19/20  Yes [name], [name], MD  pravastatin (PRAVACHOL) 40 MG tablet TAKE 1 TABLET EVERY DAY Patient taking differently: Take 40 mg by mouth daily. 03/30/21  Yes [name], [name], MD  sertraline (ZOLOFT) 50 MG tablet TAKE 1 TABLET DAILY Patient taking differently: Take 50 mg by mouth daily. 03/30/21  Yes [name], [name], MD  tamsulosin (FLOMAX) 0.4 MG CAPS capsule TAKE (2) CAPSULES DAILY. Patient taking differently: Take 0.8 mg by mouth daily. 10/19/20  Yes [name], MD  acetaminophen (TYLENOL) 325 MG tablet Take 2 tablets (650 mg total) by mouth every 6 (six) hours as needed for mild pain, fever or headache (or Fever >/= 101). 07/22/20   Shon Hale, MD  amoxicillin-clavulanate (AUGMENTIN) 875-125 MG tablet Take 1 tablet by mouth 2 (two) times daily. 02/03/21   Bennie Pierini, FNP  aspirin 81 MG chewable tablet Chew 1 tablet (81 mg total) by mouth daily with breakfast. 07/22/20   Mariea Clonts, Courage, MD  benzonatate (TESSALON) 100 MG capsule Take 1 capsule (100 mg total) by mouth 3 (three) times daily as needed for cough. 01/31/21   Daphine Deutscher, Mary-Margaret, FNP  guaiFENesin (MUCINEX) 600 MG 12 hr tablet Take 1 tablet (600 mg total) by mouth 2 (two) times daily. 07/22/20   Shon Hale, MD  guaiFENesin-dextromethorphan (ROBITUSSIN DM) 100-10 MG/5ML syrup Take 5 mLs by mouth every 4 (four) hours as needed for cough. Patient not taking: Reported on 01/31/2021 07/22/20   Shon Hale, MD  hydroquinone 4 % cream APPLY TO AFFECTED AREAS TWICE A DAY Patient not taking: Reported on 01/31/2021 09/09/18   [name], MD  mupirocin ointment (BACTROBAN) 2 % Apply 2 (two) times daily. To affected areas Patient not taking: Reported on 01/31/2021 07/16/19   [name], MD    Allergies    Neosporin [neomycin-bacitracin zn-polymyx] and Voltaren [diclofenac sodium]  Review of Systems   Review of Systems   Constitutional:  Negative for chills and fever.  HENT:  Positive for congestion. Negative for sore throat.   Respiratory:  Positive for cough and shortness of breath.   Cardiovascular:  Negative for chest pain.  Gastrointestinal:  Negative for abdominal pain, diarrhea, nausea and vomiting.  Genitourinary:  Negative for enuresis.  Musculoskeletal:  Negative for back pain.  Skin:  Negative for rash.  Neurological:  Negative for dizziness and headaches.  Hematological:  Does not bruise/bleed easily.   Physical Exam Updated Vital Signs BP (!) 128/95 (BP Location: Left Arm)   Pulse 74   Temp 99.3 F (37.4 C) (Oral)   Resp 19   Ht 5\' 8"  (1.727 m)   Wt 83.5 kg   SpO2 96%   BMI 27.98 kg/m   Physical Exam Vitals and nursing note reviewed.  Constitutional:      General: He is not in acute distress.    Appearance: He is not ill-appearing.  HENT:  Head: Normocephalic and atraumatic.     Nose: Congestion present.  Eyes:     Conjunctiva/sclera: Conjunctivae normal.  Cardiovascular:     Rate and Rhythm: Normal rate and regular rhythm.     Pulses: Normal pulses.     Heart sounds: No murmur heard.   No friction rub. No gallop.  Pulmonary:     Effort: No respiratory distress.     Breath sounds: No wheezing, rhonchi or rales.     Comments: Patient is noted bibasilar Rales worse in the left versus the right, no wheezing or rhonchi present. Abdominal:     Palpations: Abdomen is soft.     Tenderness: There is no abdominal tenderness. There is no right CVA tenderness or left CVA tenderness.  Musculoskeletal:     Right lower leg: No edema.     Left lower leg: No edema.  Skin:    General: Skin is warm and dry.  Neurological:     Mental Status: He is alert.  Psychiatric:        Mood and Affect: Mood normal.    ED Results / Procedures / Treatments   Labs (all labs ordered are listed, but only abnormal results are displayed) Labs Reviewed  COMPREHENSIVE METABOLIC PANEL -  Abnormal; Notable for the following components:      Result Value   Sodium 133 (*)    Glucose, Bld 119 (*)    Calcium 8.5 (*)    All other components within normal limits  CBC WITH DIFFERENTIAL/PLATELET - Abnormal; Notable for the following components:   RBC 4.08 (*)    Hemoglobin 11.2 (*)    HCT 36.2 (*)    RDW 16.9 (*)    All other components within normal limits  BRAIN NATRIURETIC PEPTIDE - Abnormal; Notable for the following components:   B Natriuretic Peptide 850.0 (*)    All other components within normal limits  RESP PANEL BY RT-PCR (FLU A&B, COVID) [id number]  TROPONIN I (HIGH SENSITIVITY)  TROPONIN I (HIGH SENSITIVITY)    EKG EKG Interpretation  Date/Time:  Sunday April 09 2021 17:34:35 EDT Ventricular Rate:  68 PR Interval:  260 QRS Duration: 108 QT Interval:  414 QTC Calculation: 441 R Axis:   84 Text Interpretation: Sinus rhythm Multiform ventricular premature complexes Prolonged PR interval Borderline right axis deviation Low voltage, precordial leads Nonspecific T abnormalities, lateral leads similar to 2021 Confirmed by Pricilla Loveless [phone]) on 04/09/2021 7:07:25 PM  Radiology DG Chest Port 1 View  Result Date: 04/09/2021 CLINICAL DATA:  Short of breath, cough, sneezing EXAM: PORTABLE CHEST 1 VIEW COMPARISON:  03/04/2021 FINDINGS: Single frontal view of the chest demonstrates a stable cardiac silhouette. There is chronic central vascular congestion and diffuse interstitial prominence. Trace bilateral pleural effusions. No pneumothorax. No acute bony abnormalities. IMPRESSION: 1. Findings consistent with mild volume overload superimposed upon background emphysema. Electronically Signed   By: Sharlet Salina M.D.   On: 04/09/2021 19:16    Procedures Procedures   Medications Ordered in ED Medications  furosemide (LASIX) injection 40 mg (40 mg Intravenous Given 04/09/21 2111)    ED Course  I have reviewed the triage vital signs and the nursing  notes.  Pertinent labs & imaging results that were available during my care of the patient were reviewed by me and considered in my medical decision making (see chart for details).    MDM Rules/Calculators/A&P  Initial impression-patient presents with shortness of breath, he is alert, does not appear in distress, vital signs reassuring.  Concern for CHF exacerbation versus viral URI will obtain basic lab work-up, chest x-ray EKG and reassess.  Work-up-CBC shows normocytic anemia with hemoglobin 11.2, CMP shows slight hyponatremia of 133, hyperglycemia 119, first troponin was 14, second troponin was 15, BNP is 850, respiratory panel unremarkable chest x-ray shows mild volume overload superimposed upon background of emphysema.  EKG sinus without signs of ischemia.  Reassessment-patient noted rales on my exam which correlates with imaging and BNP will provide patient with Lasix and reassess.  Patient currently has no complaints this time, mentating at difficulty, will ambulate and reassess.  Patient was ambulated remained above 95% room air he is agreed for discharge at this time.  Rule out- I have low suspicion for ACS as history is atypical, EKG was sinus rhythm without signs of ischemia, patient had a negative delta troponin.  Low suspicion for PE as patient denies pleuritic chest pain, patient denies leg pain, no pedal edema noted on exam, patient's nontachycardic, nontachypneic, nonhypoxic.  Low suspicion for CHF exacerbation requiring admission as not hypoxic my exam, not requiring oxygen,  no difficulty with ambulation.  Low suspicion for AAA or aortic dissection as history is atypical, patient has low risk factors.  Low suspicion for systemic infection as patient is nontoxic-appearing, vital signs reassuring, no obvious source infection noted on exam.  I have low suspicion for pneumonia and/or viral URI as chest x-ray is negative for acute findings, vital signs  reassuring, patient nontoxic-appearing, no leukocytosis present.   Plan-  Shortness of breath-I suspect possible from volume overloaded, will start him on few days of Lasix, have him follow-up with his cardiologist for further evaluation.  Vital signs have remained stable, no indication for hospital admission.  Patient discussed with attending and they agreed with assessment and plan.  Patient given at home care as well strict return precautions.  Patient verbalized that they understood agreed to said plan.  Final Clinical Impression(s) / ED Diagnoses Final diagnoses:  Shortness of breath    Rx / DC Orders ED Discharge Orders          Ordered    furosemide (LASIX) 40 MG tablet  Daily        04/09/21 2326             Carroll Sage, PA-C 04/09/21 2329    Pricilla Loveless, MD 04/10/21 1614

## 2021-04-12 ENCOUNTER — Telehealth: Payer: Self-pay | Admitting: Family Medicine

## 2021-04-12 NOTE — Telephone Encounter (Signed)
Left message for patient to call back and schedule Medicare Annual Wellness Visit (AWV) either virtually or in office. Left both office number and my number [phone]   Last AWV ;10/17/17  please schedule at anytime with health coach  This should be a 45 minute visit.

## 2021-04-13 ENCOUNTER — Encounter: Payer: Self-pay | Admitting: Cardiology

## 2021-04-13 ENCOUNTER — Ambulatory Visit (INDEPENDENT_AMBULATORY_CARE_PROVIDER_SITE_OTHER): Payer: Medicare Other | Admitting: Cardiology

## 2021-04-13 VITALS — BP 108/68 | HR 63 | Ht 68.0 in | Wt 191.8 lb

## 2021-04-13 DIAGNOSIS — I5022 Chronic systolic (congestive) heart failure: Secondary | ICD-10-CM | POA: Diagnosis not present

## 2021-04-13 DIAGNOSIS — I10 Essential (primary) hypertension: Secondary | ICD-10-CM

## 2021-04-13 MED ORDER — DAPAGLIFLOZIN PROPANEDIOL 10 MG PO TABS: 10.0000 mg | ORAL_TABLET | Freq: Every day | ORAL | 6 refills | Status: DC

## 2021-04-13 MED ORDER — FUROSEMIDE 40 MG PO TABS: 40.0000 mg | ORAL_TABLET | Freq: Every day | ORAL | 6 refills | Status: DC

## 2021-04-13 MED ORDER — DAPAGLIFLOZIN PROPANEDIOL 10 MG PO TABS: 10.0000 mg | ORAL_TABLET | Freq: Every day | ORAL | 0 refills | Status: DC

## 2021-04-13 NOTE — Progress Notes (Signed)
Clinical Summary Taylor Cross is a 75 y.o.male seen today for follow up of the following medical problems.  1. Chronic Systolic/Diastolic heart failure   - 10/2013 echo which showed LVEF 40-45%, mid-inferolateral wall hypokinesis, and grade II diastolic dysfunction.   - repeat echo 08/2014 LVEF 35-40%, basal to mid inferolateral and inferior walls, grade I diastolic dysfunction - completed lexiscan which showed moderate sized moderate to severe intensity inferolateral wall defect and small apical to mid inferior wall defect. Both areas were fixed and consistent with scar.      - lisinopril  stopped by other provider due to decreasing renal function.    12/2018 echo: LVEF 50-55%     - admitted 06/2020 with CHF exacerbation and pneumonia - echo from 07/20/2020 shows that EF is down to 35 to 40% from 50 to 55% back in June 2020, - home SBP's 100s.  - some orthostatic symptoms at times - breathing has done well since discharge     08/2020 echo LVEF 40-45%, grade I dd, normal RV  Low bp's and fatigue on coresg 60m bid, lowered to 12.585mbid 04/09/21 ER visit with SOB. Elevated BNP and CXR with volume overload Started on lasix 4081maily. He had not been on lasix at the time - symptoms much improved with diuretic    2. CKD III - Cr has improved off ACE-I.      3. HL    4. HTN  Past Medical History:  Diagnosis Date   [place]ute respiratory failure with hypoxia and [place]capnia ([place] [facility] systolic CHF (congestive heart failure[place]   Combined systolic and diastolic heart failure (HCC)    Hyperlipidemia      Allergies  Allergen Reactions   Neosporin [Neomycin-Bacitracin Zn-Polymyx] Rash   Voltaren [Diclofenac Sodium] Other (See Comments)    GI upset     Current Outpatient Medications  Medication Sig Dispense Refill   acetaminophen (TYLENOL) 325 MG tablet Take 2 tablets (650 mg total) by mouth every 6 (six) hours as needed for mild pain, fever or  headache (or Fever >/= 101). 12 tablet 0   allopurinol (ZYLOPRIM) 100 MG tablet Take 1 tablet (100 mg total) by mouth daily. 90 tablet 3   amoxicillin-clavulanate (AUGMENTIN) 875-125 MG tablet Take 1 tablet by mouth 2 (two) times daily. 14 tablet 0   aspirin 81 MG chewable tablet Chew 1 tablet (81 mg total) by mouth daily with breakfast. 120 tablet 3   benzonatate (TESSALON) 100 MG capsule Take 1 capsule (100 mg total) by mouth 3 (three) times daily as needed for cough. 30 capsule 0   carvedilol (COREG) 12.5 MG tablet Take 1 tablet (12.5 mg total) by mouth 2 (two) times daily. 180 tablet 3   finasteride (PROSCAR) 5 MG tablet Take 1 tablet (5 mg total) by mouth daily. (Patient taking differently: Take 5 mg by mouth at bedtime.) 90 tablet 1   fluticasone (FLONASE) 50 MCG/ACT nasal spray Place 2 sprays into both nostrils daily. (Patient taking differently: Place 2 sprays into both nostrils at bedtime.) 16 g 11   furosemide (LASIX) 40 MG tablet Take 1 tablet (40 mg total) by mouth daily for 3 days. 3 tablet 0   gabapentin (NEURONTIN) 300 MG capsule TAKE (1) CAPSULE TWICE DAILY. (Patient taking differently: Take 300 mg by mouth 2 (two) times daily.) 180 capsule 1   guaiFENesin (MUCINEX) 600 MG 12 hr tablet Take 1 tablet (600 mg total) by mouth 2 (two) times  daily. 20 tablet 0   guaiFENesin-dextromethorphan (ROBITUSSIN DM) 100-10 MG/5ML syrup Take 5 mLs by mouth every 4 (four) hours as needed for cough. (Patient not taking: Reported on 01/31/2021) 118 mL 0   hydroquinone 4 % cream APPLY TO AFFECTED AREAS TWICE A DAY (Patient not taking: Reported on 01/31/2021) 28.35 g 0   loratadine (CLARITIN) 10 MG tablet TAKE ONE TABLET AT BEDTIME (Patient taking differently: Take 10 mg by mouth at bedtime.) 90 tablet 3   mupirocin ointment (BACTROBAN) 2 % Apply 2 (two) times daily. To affected areas (Patient not taking: Reported on 01/31/2021) 22 g 2   pantoprazole (PROTONIX) 40 MG tablet Take 1 tablet (40 mg total) by mouth  daily. TAKE 1 TABLET ONCE DAILY FOR REFLUX (Patient taking differently: Take 40 mg by mouth daily.) 90 tablet 3   pravastatin (PRAVACHOL) 40 MG tablet TAKE 1 TABLET EVERY DAY (Patient taking differently: Take 40 mg by mouth daily.) 90 tablet 0   sertraline (ZOLOFT) 50 MG tablet TAKE 1 TABLET DAILY (Patient taking differently: Take 50 mg by mouth daily.) 90 tablet 0   tamsulosin (FLOMAX) 0.4 MG CAPS capsule TAKE (2) CAPSULES DAILY. (Patient taking differently: Take 0.8 mg by mouth daily.) 180 capsule 1   No current facility-administered medications for this visit.     Past Surgical History:  Procedure Laterality Date   HERNIA REPAIR     TONSILLECTOMY       Allergies  Allergen Reactions   Neosporin [Neomycin-Bacitracin Zn-Polymyx] Rash   Voltaren [Diclofenac Sodium] Other (See Comments)    GI upset      Family History  Problem Relation Age of Onset   Heart disease Mother    Diabetes Mother    Heart failure Mother    COPD Father    Heart attack Brother 87     Social History Mr. Bondarenko reports that he has never smoked. He has never used smokeless tobacco. Mr. Krotzer reports no history of alcohol use.   Review of Systems CONSTITUTIONAL: No weight loss, fever, chills, weakness or fatigue.  HEENT: Eyes: No visual loss, blurred vision, double vision or yellow sclerae.No hearing loss, sneezing, congestion, runny nose or sore throat.  SKIN: No rash or itching.  CARDIOVASCULAR: per hpi RESPIRATORY: No shortness of breath, cough or sputum.  GASTROINTESTINAL: No anorexia, nausea, vomiting or diarrhea. No abdominal pain or blood.  GENITOURINARY: No burning on urination, no polyuria NEUROLOGICAL: No headache, dizziness, syncope, paralysis, ataxia, numbness or tingling in the extremities. No change in bowel or bladder control.  MUSCULOSKELETAL: No muscle, back pain, joint pain or stiffness.  LYMPHATICS: No enlarged nodes. No history of splenectomy.  PSYCHIATRIC: No history of  depression or anxiety.  ENDOCRINOLOGIC: No reports of sweating, cold or heat intolerance. No polyuria or polydipsia.  Marland Kitchen   Physical Examination Today's Vitals   04/13/21 1324  BP: 108/68  Pulse: 63  SpO2: 94%  Weight: 191 lb 12.8 oz (87 kg)  Height: _0  (1.727 m)   Body mass index is 29.16 kg/m.  Gen: resting comfortably, no acute distress HEENT: no scleral icterus, pupils equal round and reactive, no palptable cervical adenopathy,  CV: RRR, no m/r/g no jvd Resp: Clear to auscultation bilaterally GI: abdomen is soft, non-tender, non-distended, normal bowel sounds, no hepatosplenomegaly MSK: extremities are warm, no edema.  Skin: warm, no rash Neuro:  no focal deficits Psych: appropriate affect   Diagnostic Studies 10/2013 Echo   Left ventricle: The cavity size was mildly dilated. Wall thickness was  normal. Systolic function was mildly to moderately reduced. The estimated ejection fraction is 40%. Features are consistent with a pseudonormal left ventricular filling pattern, with concomitant abnormal relaxation and increased filling pressure (grade 2 diastolic dysfunction). Doppler parameters are consistent with high ventricular filling pressure. - Regional wall motion abnormality: Mild to moderate hypokinesis of the mid inferolateral myocardium. The remaining walls are mildly hypokinetic. - Aortic valve: Trileaflet; mildly thickened, mildly calcified leaflets. There was no stenosis. - Mitral valve: Mildly thickened leaflets . Mild to moderate regurgitation. - Left atrium: The atrium was moderately dilated. - Tricuspid valve: Mild regurgitation. Inadequate TR jet to accurately assess pulmonary pressures. - Pericardium, extracardiac: There was a left pleural effusion.   11/11/13 Lexiscan MPI   Gated imaging reveals an EDV of 223, ESV of 153, TID ratio 0.99, and LVEF of 31% with global hypokinesis, most prominent in the apex and inferolateral  walls.  IMPRESSION: Intermediate risk Lexiscan Cardiolite. No diagnostic ST segment changes were noted to indicate ischemia, frequent PVCs were seen without sustained arrhythmia. Perfusion imaging is most consistent with scar affecting the inferior wall and inferolateral wall as outlined, no large ischemic zones however. LVEF is calculated at 31% with global hypokinesis that is most prominent at the apex and inferior wall. EDV indicates moderate to severe chamber dilatation. Findings are consistent with probable ischemic cardiomyopathy.     08/2014 echo Study Conclusions  - Left ventricle: The cavity size was mildly dilated. Wall   thickness was increased in a pattern of moderate LVH. Systolic   function was moderately reduced. The estimated ejection fraction   was in the range of 35% to 40%. There is hypokinesis to akinesis   of the basal-mid inferolateral and inferior myocardium. Doppler   parameters are consistent with abnormal left ventricular   relaxation (grade 1 diastolic dysfunction). - Aortic valve: Mildly to moderately calcified annulus. Trileaflet. - Mitral valve: Mildly calcified annulus. There was mild   regurgitation. - Left atrium: The atrium was mildly to moderately dilated. - Right atrium: Central venous pressure (est): 3 mm Hg. - Atrial septum: Possible PFO or secundum ASD with apparent left to   right flow noted by color Doppler. Cannot exclude vigorous venous   return adjacent to septum. Agitated saline study could be   considered for further evaluation. - Tricuspid valve: There was trivial regurgitation. - Pulmonary arteries: PA peak pressure: 17 mm Hg (S). - Pericardium, extracardiac: There was no pericardial effusion.  Recommendations:  Moderate LVH with mild LV chamber dilatation and LVEF approximately 35-40%. Hypokinesis to akinesis of the mid to basal inferolateral/inferior walls noted suggestive of ischemic cardiomyopathy. Grade 1 diastolic  dysfunction. Compared to prior study in April 2015, wall motion has improved somewhat and ejection fraction looks to be higher, although graded in similar range last time. Mild to moderate left atrial enlargement. Mild MAC with mild mitral regurgitation. Possible PFO or secundum ASD with apparent left to right flow noted by color Doppler. Cannot exclude vigorous venous return adjacent to septum. Agitated saline study could be considered for further evaluation. Trivial tricuspid regurgitation with PASP normal range.             12/2018 echo 1. The left ventricle has low normal systolic function, with an ejection fraction of 50-55%. The cavity size was normal. There is mildly increased left ventricular wall thickness. Left ventricular diastolic Doppler parameters are consistent with  impaired relaxation. Elevated mean left atrial pressure.  2. The right ventricle has normal systolic function. The cavity  was normal. There is no increase in right ventricular wall thickness.  3. Left atrial size was moderately dilated.  4. No evidence of mitral valve stenosis.  5. The aortic valve is tricuspid. No stenosis of the aortic valve.  6. The aortic root is normal in size and structure.  7. Pulmonary hypertension is indeterminant, inadequate TR jet.     06/2020 echo IMPRESSIONS     1. Left ventricular ejection fraction, by estimation, is 35 to 40%. The  left ventricle has moderately decreased function. The left ventricle  demonstrates regional wall motion abnormalities (see scoring  diagram/findings for description). There is mild  left ventricular hypertrophy. Left ventricular diastolic parameters are  indeterminate.   2. Right ventricular systolic function is normal. The right ventricular  size is normal. Tricuspid regurgitation signal is inadequate for assessing  PA pressure.   3. Left atrial size was mild to moderately dilated.   4. The mitral valve is grossly normal. Mild mitral valve  regurgitation.   5. The aortic valve is tricuspid. There is moderate calcification of the  aortic valve. Aortic valve regurgitation is not visualized. Mild aortic  valve stenosis. Aortic valve mean gradient measures 10.0 mmHg.   6. The inferior vena cava is normal in size with greater than 50%  respiratory variability, suggesting right atrial pressure of 3 mmHg.   7. Left pleural effusion is noted.     08/2020 echo IMPRESSIONS     1. Left ventricular ejection fraction, by estimation, is 40 to 45%. The  left ventricle has mildly decreased function. The left ventricle  demonstrates global hypokinesis. There is mild left ventricular  hypertrophy. Left ventricular diastolic parameters  are consistent with Grade I diastolic dysfunction (impaired relaxation).   2. Right ventricular systolic function is normal. The right ventricular  size is normal.   3. Left atrial size was severely dilated.   4. The mitral valve is normal in structure. Trivial mitral valve  regurgitation. No evidence of mitral stenosis.   5. The aortic valve is tricuspid. There is mild calcification of the  aortic valve. There is mild thickening of the aortic valve. Aortic valve  regurgitation is not visualized. No aortic stenosis is present.   6. The inferior vena cava is normal in size with greater than 50%  respiratory variability, suggesting right atrial pressure of 3 mmHg.     Assessment and Plan  1. Chronic Systolic/diastolic heart failure -Medical therapy has been limited due to orthostatic dizziness, renal dysfunction -LVEF had previously normalized, now with recurrent dysfunction noted during recent admission with pneumonia - history of CKD, prior AKI on ACEI has not been on ACE/ARB/ARNI - start farxiga 75m daily - last Cr best its been in awhile, could consider trial of low dose ARb in the future - will be on lasix 46mdaily, additional 2043mrn. BMET/Mg 1-2 weeks.  - if persistent LV dysfunction may  consider ishcemic evaluation depending on renal function, he had been hesitant in the past     JonArnoldo Lenis.D.

## 2021-04-13 NOTE — Patient Instructions (Addendum)
Medication Instructions:  Begin Farxiga 10mg  daily  Begin Lasix 40mg  daily - may take 20mg  extra if needed for swelling or shortness of breath. Continue all other medications.     Labwork: BMET, Mg - orders given today.  Please do in 2 weeks (around 04/27/2021).   Office will contact with results via phone or letter.    Testing/Procedures: none  Follow-Up: 1 month   Any Other Special Instructions Will Be Listed Below (If Applicable).   If you need a refill on your cardiac medications before your next appointment, please call your pharmacy.

## 2021-04-19 ENCOUNTER — Other Ambulatory Visit: Payer: Self-pay

## 2021-04-19 ENCOUNTER — Encounter: Payer: Self-pay | Admitting: Family Medicine

## 2021-04-19 ENCOUNTER — Ambulatory Visit (INDEPENDENT_AMBULATORY_CARE_PROVIDER_SITE_OTHER): Payer: Medicare Other | Admitting: Family Medicine

## 2021-04-19 VITALS — BP 107/64 | HR 64 | Temp 97.3°F | Ht 68.0 in | Wt 187.6 lb

## 2021-04-19 DIAGNOSIS — Z23 Encounter for immunization: Secondary | ICD-10-CM

## 2021-04-19 DIAGNOSIS — R351 Nocturia: Secondary | ICD-10-CM | POA: Diagnosis not present

## 2021-04-19 DIAGNOSIS — E782 Mixed hyperlipidemia: Secondary | ICD-10-CM

## 2021-04-19 DIAGNOSIS — I10 Essential (primary) hypertension: Secondary | ICD-10-CM | POA: Diagnosis not present

## 2021-04-19 DIAGNOSIS — N401 Enlarged prostate with lower urinary tract symptoms: Secondary | ICD-10-CM

## 2021-04-19 LAB — CMP14+EGFR
ALT: 8 IU/L (ref 0–44)
AST: 9 IU/L (ref 0–40)
Albumin/Globulin Ratio: 1.7 (ref 1.2–2.2)
Albumin: 4.8 g/dL — ABNORMAL HIGH (ref 3.7–4.7)
Alkaline Phosphatase: 89 IU/L (ref 44–121)
BUN/Creatinine Ratio: 20 (ref 10–24)
BUN: 38 mg/dL — ABNORMAL HIGH (ref 8–27)
Bilirubin Total: 0.5 mg/dL (ref 0.0–1.2)
CO2: 25 mmol/L (ref 20–29)
Calcium: 9.6 mg/dL (ref 8.6–10.2)
Chloride: 98 mmol/L (ref 96–106)
Creatinine, Ser: 1.9 mg/dL — ABNORMAL HIGH (ref 0.76–1.27)
Globulin, Total: 2.8 g/dL (ref 1.5–4.5)
Glucose: 103 mg/dL — ABNORMAL HIGH (ref 65–99)
Potassium: 4.4 mmol/L (ref 3.5–5.2)
Sodium: 140 mmol/L (ref 134–144)
Total Protein: 7.6 g/dL (ref 6.0–8.5)
eGFR: 36 mL/min/{1.73_m2} — ABNORMAL LOW (ref >59)

## 2021-04-19 LAB — CBC WITH DIFFERENTIAL/PLATELET
Basophils Absolute: 0 10*3/uL (ref 0.0–0.2)
Basos: 1 %
EOS (ABSOLUTE): 0.4 10*3/uL (ref 0.0–0.4)
Eos: 5 %
Hematocrit: 37.5 % (ref 37.5–51.0)
Hemoglobin: 11.9 g/dL — ABNORMAL LOW (ref 13.0–17.7)
Immature Grans (Abs): 0 10*3/uL (ref 0.0–0.1)
Immature Granulocytes: 0 %
Lymphocytes Absolute: 2 10*3/uL (ref 0.7–3.1)
Lymphs: 28 %
MCH: 27.5 pg (ref 26.6–33.0)
MCHC: 31.7 g/dL (ref 31.5–35.7)
MCV: 87 fL (ref 79–97)
Monocytes Absolute: 0.7 10*3/uL (ref 0.1–0.9)
Monocytes: 10 %
Neutrophils Absolute: 4.1 10*3/uL (ref 1.4–7.0)
Neutrophils: 56 %
Platelets: 234 10*3/uL (ref 150–450)
RBC: 4.33 x10E6/uL (ref 4.14–5.80)
RDW: 16.3 % — ABNORMAL HIGH (ref 11.6–15.4)
WBC: 7.3 10*3/uL (ref 3.4–10.8)

## 2021-04-19 LAB — LIPID PANEL
Chol/HDL Ratio: 3.8 ratio (ref 0.0–5.0)
Cholesterol, Total: 211 mg/dL — ABNORMAL HIGH (ref 100–199)
HDL: 55 mg/dL (ref >39)
LDL Chol Calc (NIH): 127 mg/dL — ABNORMAL HIGH (ref 0–99)
Triglycerides: 164 mg/dL — ABNORMAL HIGH (ref 0–149)
VLDL Cholesterol Cal: 29 mg/dL (ref 5–40)

## 2021-04-19 MED ORDER — SERTRALINE HCL 50 MG PO TABS: 50.0000 mg | ORAL_TABLET | Freq: Every day | ORAL | 2 refills | Status: DC

## 2021-04-19 MED ORDER — TAMSULOSIN HCL 0.4 MG PO CAPS: ORAL_CAPSULE | ORAL | 3 refills | Status: DC

## 2021-04-19 MED ORDER — FINASTERIDE 5 MG PO TABS: 5.0000 mg | ORAL_TABLET | Freq: Every day | ORAL | 3 refills | Status: DC

## 2021-04-19 MED ORDER — PRAVASTATIN SODIUM 40 MG PO TABS: 40.0000 mg | ORAL_TABLET | Freq: Every day | ORAL | 2 refills | Status: DC

## 2021-04-19 NOTE — Progress Notes (Signed)
Subjective:  Patient ID: Taylor Cross,  male    DOB: 05-31-1946  Age: 75 y.o.    CC: Medical Management of Chronic Issues   HPI Taylor Cross presents for  follow-up of hypertension. Patient has no history of headache chest pain or shortness of breath or recent cough. Patient also denies symptoms of TIA such as numbness weakness lateralizing. Patient denies side effects from medication. States taking it regularly.   in for follow-up of elevated cholesterol. Doing well without complaints on current medication. Denies side effects of statin including myalgia and arthralgia and nausea. Currently no chest pain, shortness of breath or other cardiovascular related symptoms noted.  Hx CHF recent E.D. evaluation and follow up with Dr. Harl Bowie last week. Placed on Farxiga.  Urine flow is good so BPH is well controlled with med.   History Taylor Cross has a past medical history of Acute respiratory failure with hypoxia and hypercapnia ([place]) (29/52/8413), Acute systolic CHF (congestive heart failure) ([place]) (07/19/2020), Combined systolic and diastolic heart failure ([place]), and Hyperlipidemia.   He has a past surgical history that includes Tonsillectomy and Hernia repair.   His family history includes COPD in his father; Diabetes in his mother; Heart attack (age of onset: 45) in his brother; Heart disease in his mother; Heart failure in his mother.He reports that he has never smoked. He has never used smokeless tobacco. He reports that he does not drink alcohol and does not use drugs.  Current Outpatient Medications on File Prior to Visit  Medication Sig Dispense Refill   acetaminophen (TYLENOL) 325 MG tablet Take 2 tablets (650 mg total) by mouth every 6 (six) hours as needed for mild pain, fever or headache (or Fever >/= 101). 12 tablet 0   allopurinol (ZYLOPRIM) 100 MG tablet Take 1 tablet (100 mg total) by mouth daily. 90 tablet 3   aspirin 81 MG chewable tablet Chew 1 tablet (81 mg total) by mouth  daily with breakfast. 120 tablet 3   dapagliflozin propanediol (FARXIGA) 10 MG TABS tablet Take 1 tablet (10 mg total) by mouth daily before breakfast. 30 tablet 6   fluticasone (FLONASE) 50 MCG/ACT nasal spray Place 2 sprays into both nostrils daily. (Patient taking differently: Place 2 sprays into both nostrils at bedtime.) 16 g 11   furosemide (LASIX) 40 MG tablet Take 1 tablet (40 mg total) by mouth daily. (MAY TAKE AN ADDITIONAL 1/2 TABLET (50m) AS NEEDED FOR SOB OR SWELLING 45 tablet 6   gabapentin (NEURONTIN) 300 MG capsule TAKE (1) CAPSULE TWICE DAILY. (Patient taking differently: TAKE  (1)  CAPSULE  TWICE DAILY.) 180 capsule 1   hydroquinone 4 % cream APPLY TO AFFECTED AREAS TWICE A DAY 28.35 g 0   loratadine (CLARITIN) 10 MG tablet TAKE ONE TABLET AT BEDTIME (Patient taking differently: Take 10 mg by mouth at bedtime.) 90 tablet 3   mupirocin ointment (BACTROBAN) 2 % Apply 2 (two) times daily. To affected areas 22 g 2   pantoprazole (PROTONIX) 40 MG tablet Take 1 tablet (40 mg total) by mouth daily. TAKE 1 TABLET ONCE DAILY FOR REFLUX (Patient taking differently: Take 40 mg by mouth daily.) 90 tablet 3   carvedilol (COREG) 12.5 MG tablet Take 1 tablet (12.5 mg total) by mouth 2 (two) times daily. 180 tablet 3   No current facility-administered medications on file prior to visit.    ROS Review of Systems  Constitutional:  Negative for fever.  Respiratory:  Negative for shortness of breath.  Cardiovascular:  Negative for chest pain.  Musculoskeletal:  Negative for arthralgias.  Skin:  Negative for rash.   Objective:  BP 107/64   Pulse 64   Temp (!) 97.3 F (36.3 C)   Ht 5' 8"  (1.727 m)   Wt 187 lb 9.6 oz (85.1 kg)   SpO2 97%   BMI 28.52 kg/m   BP Readings from Last 3 Encounters:  04/19/21 107/64  04/13/21 108/68  04/10/21 134/70    Wt Readings from Last 3 Encounters:  04/19/21 187 lb 9.6 oz (85.1 kg)  04/13/21 191 lb 12.8 oz (87 kg)  04/09/21 184 lb (83.5 kg)      Physical Exam Vitals reviewed.  Constitutional:      Appearance: He is well-developed.  HENT:     Head: Normocephalic and atraumatic.     Right Ear: External ear normal.     Left Ear: External ear normal.     Mouth/Throat:     Pharynx: No oropharyngeal exudate or posterior oropharyngeal erythema.  Eyes:     Pupils: Pupils are equal, round, and reactive to light.  Cardiovascular:     Rate and Rhythm: Normal rate and regular rhythm.     Heart sounds: No murmur heard. Pulmonary:     Effort: No respiratory distress.     Breath sounds: Normal breath sounds.  Musculoskeletal:     Cervical back: Normal range of motion and neck supple.  Neurological:     Mental Status: He is alert and oriented to person, place, and time.    Diabetic Foot Exam - Simple   No data filed       Assessment & Plan:   Kieon was seen today for medical management of chronic issues.  Diagnoses and all orders for this visit:  Essential hypertension -     CBC with Differential/Platelet -     CMP14+EGFR  Mixed hyperlipidemia -     Lipid panel  Benign prostatic hyperplasia with nocturia -     finasteride (PROSCAR) 5 MG tablet; Take 1 tablet (5 mg total) by mouth daily. -     tamsulosin (FLOMAX) 0.4 MG CAPS capsule; TAKE (2) CAPSULES DAILY.  Need for immunization against influenza -     Flu Vaccine QUAD High Dose(Fluad)  Other orders -     pravastatin (PRAVACHOL) 40 MG tablet; Take 1 tablet (40 mg total) by mouth daily. -     sertraline (ZOLOFT) 50 MG tablet; Take 1 tablet (50 mg total) by mouth daily.  I have discontinued Almond Lint. [place]'s guaiFENesin, guaiFENesin-dextromethorphan, benzonatate, and amoxicillin-clavulanate. I have also changed his pravastatin and sertraline. Additionally, I am having him maintain his hydroquinone, mupirocin ointment, aspirin, acetaminophen, allopurinol, carvedilol, pantoprazole, fluticasone, loratadine, gabapentin, furosemide, dapagliflozin propanediol,  finasteride, and tamsulosin.  Meds ordered this encounter  Medications   finasteride (PROSCAR) 5 MG tablet    Sig: Take 1 tablet (5 mg total) by mouth daily.    Dispense:  90 tablet    Refill:  3   pravastatin (PRAVACHOL) 40 MG tablet    Sig: Take 1 tablet (40 mg total) by mouth daily.    Dispense:  90 tablet    Refill:  2   sertraline (ZOLOFT) 50 MG tablet    Sig: Take 1 tablet (50 mg total) by mouth daily.    Dispense:  90 tablet    Refill:  2   tamsulosin (FLOMAX) 0.4 MG CAPS capsule    Sig: TAKE (2) CAPSULES DAILY.    Dispense:  180 capsule    Refill:  3     Follow-up: Return in about 3 months (around 07/19/2021).  Claretta Fraise, M.D.

## 2021-04-25 ENCOUNTER — Ambulatory Visit: Payer: Medicare Other | Admitting: Family Medicine

## 2021-04-26 ENCOUNTER — Other Ambulatory Visit: Payer: Medicare Other

## 2021-04-26 ENCOUNTER — Other Ambulatory Visit: Payer: Self-pay

## 2021-04-27 LAB — BASIC METABOLIC PANEL
BUN/Creatinine Ratio: 18 (ref 10–24)
BUN: 35 mg/dL — ABNORMAL HIGH (ref 8–27)
CO2: 26 mmol/L (ref 20–29)
Calcium: 9.5 mg/dL (ref 8.6–10.2)
Chloride: 100 mmol/L (ref 96–106)
Creatinine, Ser: 1.9 mg/dL — ABNORMAL HIGH (ref 0.76–1.27)
Glucose: 103 mg/dL — ABNORMAL HIGH (ref 70–99)
Potassium: 4.2 mmol/L (ref 3.5–5.2)
Sodium: 141 mmol/L (ref 134–144)
eGFR: 36 mL/min/{1.73_m2} — ABNORMAL LOW (ref >59)

## 2021-04-27 LAB — MAGNESIUM: Magnesium: 2.3 mg/dL (ref 1.6–2.3)

## 2021-05-10 NOTE — Progress Notes (Signed)
Cardiology Office Note  Date: 05/11/2021   ID: Taylor Cross, DOB 1945-09-08, MRN [id number]  PCP:  Claretta Fraise, MD  Cardiologist:  Carlyle Dolly, MD Electrophysiologist:  None   Chief Complaint: 1 month follow-up  History of Present Illness: Taylor Cross is a 75 y.o. male with a history of PVCs, HTN, emphysema, GERD, HLD, chronic systolic heart failure dysfunction.  He was last seen by Dr. Harl Bowie on 04/13/2021.  Medical therapy for chronic systolic/diastolic heart failure had been limited due to orthostatic dizziness and renal dysfunction.  EF had previously normalized now with recurrent dysfunction noted during recent admission with pneumonia.  History of CKD with prior AKI on ACE inhibitor.  Had not been on ACE/ARB/ARNI.   Wilder Glade was started 10 mg daily.  Could try a low-dose of ARB in the future.  Plan to be on Lasix 40 mg daily an additional 20 mg as needed.  Basic metabolic panel and magnesium in 1 to 2 weeks.  If persistent LV dysfunction could consider ischemic evaluation depending on renal function.  He had been hesitant in the past.  He is here for 1 month follow-up.  At last visit he was started on Farxiga 10 mg daily.  States he recently saw his primary care provider who told him to decrease his dose of Lasix down to 20 mg daily due to it affecting his kidney function.  Recent lab work demonstrated creatinine of 1.90 and GFR of 36 on 04/26/2021.  A week prior he had labs showing the very same numbers.  On 04/09/2021 his creatinine was 1.17 he has gained some weight in the interim.  He blames it on eating too much ice cream and cookies.  He has no shortness of breath or lower extremity edema.  Past Medical History:  Diagnosis Date   Acute respiratory failure with hypoxia and hypercapnia (HCC) 37/48/2707   Acute systolic CHF (congestive heart failure) ([place]) 07/19/2020   Combined systolic and diastolic heart failure (HCC)    Hyperlipidemia     Past Surgical History:   Procedure Laterality Date   HERNIA REPAIR     TONSILLECTOMY      Current Outpatient Medications  Medication Sig Dispense Refill   acetaminophen (TYLENOL) 325 MG tablet Take 2 tablets (650 mg total) by mouth every 6 (six) hours as needed for mild pain, fever or headache (or Fever >/= 101). 12 tablet 0   allopurinol (ZYLOPRIM) 100 MG tablet Take 1 tablet (100 mg total) by mouth daily. 90 tablet 3   aspirin 81 MG chewable tablet Chew 1 tablet (81 mg total) by mouth daily with breakfast. 120 tablet 3   finasteride (PROSCAR) 5 MG tablet Take 1 tablet (5 mg total) by mouth daily. 90 tablet 3   fluticasone (FLONASE) 50 MCG/ACT nasal spray Place 2 sprays into both nostrils daily. (Patient taking differently: Place 2 sprays into both nostrils at bedtime.) 16 g 11   furosemide (LASIX) 40 MG tablet Take 20 mg by mouth daily.     gabapentin (NEURONTIN) 300 MG capsule TAKE (1) CAPSULE TWICE DAILY. (Patient taking differently: TAKE  (1)  CAPSULE  TWICE DAILY.) 180 capsule 1   hydroquinone 4 % cream APPLY TO AFFECTED AREAS TWICE A DAY 28.35 g 0   loratadine (CLARITIN) 10 MG tablet TAKE ONE TABLET AT BEDTIME (Patient taking differently: Take 10 mg by mouth at bedtime.) 90 tablet 3   mupirocin ointment (BACTROBAN) 2 % Apply 2 (two) times daily. To affected areas 22  g 2   pantoprazole (PROTONIX) 40 MG tablet Take 1 tablet (40 mg total) by mouth daily. TAKE 1 TABLET ONCE DAILY FOR REFLUX (Patient taking differently: Take 40 mg by mouth daily.) 90 tablet 3   pravastatin (PRAVACHOL) 40 MG tablet Take 1 tablet (40 mg total) by mouth daily. 90 tablet 2   sertraline (ZOLOFT) 50 MG tablet Take 1 tablet (50 mg total) by mouth daily. 90 tablet 2   tamsulosin (FLOMAX) 0.4 MG CAPS capsule TAKE (2) CAPSULES DAILY. 180 capsule 3   carvedilol (COREG) 12.5 MG tablet Take 1 tablet (12.5 mg total) by mouth 2 (two) times daily. 180 tablet 3   No current facility-administered medications for this visit.   Allergies:   Neosporin [neomycin-bacitracin zn-polymyx] and Voltaren [diclofenac sodium]   Social History: The patient  reports that he has never smoked. He has never used smokeless tobacco. He reports that he does not drink alcohol and does not use drugs.   Family History: The patient's family history includes COPD in his father; Diabetes in his mother; Heart attack (age of onset: 77) in his brother; Heart disease in his mother; Heart failure in his mother.   ROS:  Please see the history of present illness. Otherwise, complete review of systems is positive for none.  All other systems are reviewed and negative.   Physical Exam: VS:  BP 128/74   Pulse 74   Ht _0  (1.727 m)   Wt 198 lb 12.8 oz (90.2 kg)   SpO2 98%   BMI 30.23 kg/m , BMI Body mass index is 30.23 kg/m.  Wt Readings from Last 3 Encounters:  05/11/21 198 lb 12.8 oz (90.2 kg)  04/19/21 187 lb 9.6 oz (85.1 kg)  04/13/21 191 lb 12.8 oz (87 kg)    General: Patient appears comfortable at rest. Neck: Supple, no elevated JVP or carotid bruits, no thyromegaly. Lungs: Clear to auscultation, nonlabored breathing at rest. Cardiac: Regular rate and rhythm, no S3 or significant systolic murmur, no pericardial rub. Extremities: No pitting edema, distal pulses 2+. Skin: Warm and dry. Musculoskeletal: No kyphosis. Neuropsychiatric: Alert and oriented x3, affect grossly appropriate.  ECG:    Recent Labwork: 04/09/2021: B Natriuretic Peptide 850.0 04/19/2021: ALT 8; AST 9; Hemoglobin 11.9; Platelets 234 04/26/2021: BUN 35; Creatinine, Ser 1.90; Magnesium 2.3; Potassium 4.2; Sodium 141     Component Value Date/Time   CHOL 211 (H) 04/19/2021 1054   TRIG 164 (H) 04/19/2021 1054   TRIG 143 09/30/2014 0845   HDL 55 04/19/2021 1054   HDL 49 09/30/2014 0845   CHOLHDL 3.8 04/19/2021 1054   LDLCALC 127 (H) 04/19/2021 1054    Other Studies Reviewed Today: 10/2013 Echo   Left ventricle: The cavity size was mildly dilated. Wall thickness was  normal. Systolic function was mildly to moderately reduced. The estimated ejection fraction is 40%. Features are consistent with a pseudonormal left ventricular filling pattern, with concomitant abnormal relaxation and increased filling pressure (grade 2 diastolic dysfunction). Doppler parameters are consistent with high ventricular filling pressure. - Regional wall motion abnormality: Mild to moderate hypokinesis of the mid inferolateral myocardium. The remaining walls are mildly hypokinetic. - Aortic valve: Trileaflet; mildly thickened, mildly calcified leaflets. There was no stenosis. - Mitral valve: Mildly thickened leaflets . Mild to moderate regurgitation. - Left atrium: The atrium was moderately dilated. - Tricuspid valve: Mild regurgitation. Inadequate TR jet to accurately assess pulmonary pressures. - Pericardium, extracardiac: There was a left pleural effusion.   11/11/13 Lexiscan MPI  Gated imaging reveals an EDV of 223, ESV of 153, TID ratio 0.99, and LVEF of 31% with global hypokinesis, most prominent in the apex and inferolateral walls.  IMPRESSION: Intermediate risk Lexiscan Cardiolite. No diagnostic ST segment changes were noted to indicate ischemia, frequent PVCs were seen without sustained arrhythmia. Perfusion imaging is most consistent with scar affecting the inferior wall and inferolateral wall as outlined, no large ischemic zones however. LVEF is calculated at 31% with global hypokinesis that is most prominent at the apex and inferior wall. EDV indicates moderate to severe chamber dilatation. Findings are consistent with probable ischemic cardiomyopathy.     08/2014 echo Study Conclusions  - Left ventricle: The cavity size was mildly dilated. Wall   thickness was increased in a pattern of moderate LVH. Systolic   function was moderately reduced. The estimated ejection fraction   was in the range of 35% to 40%. There is hypokinesis to akinesis   of the  basal-mid inferolateral and inferior myocardium. Doppler   parameters are consistent with abnormal left ventricular   relaxation (grade 1 diastolic dysfunction). - Aortic valve: Mildly to moderately calcified annulus. Trileaflet. - Mitral valve: Mildly calcified annulus. There was mild   regurgitation. - Left atrium: The atrium was mildly to moderately dilated. - Right atrium: Central venous pressure (est): 3 mm Hg. - Atrial septum: Possible PFO or secundum ASD with apparent left to   right flow noted by color Doppler. Cannot exclude vigorous venous   return adjacent to septum. Agitated saline study could be   considered for further evaluation. - Tricuspid valve: There was trivial regurgitation. - Pulmonary arteries: PA peak pressure: 17 mm Hg (S). - Pericardium, extracardiac: There was no pericardial effusion.  Recommendations:  Moderate LVH with mild LV chamber dilatation and LVEF approximately 35-40%. Hypokinesis to akinesis of the mid to basal inferolateral/inferior walls noted suggestive of ischemic cardiomyopathy. Grade 1 diastolic dysfunction. Compared to prior study in April 2015, wall motion has improved somewhat and ejection fraction looks to be higher, although graded in similar range last time. Mild to moderate left atrial enlargement. Mild MAC with mild mitral regurgitation. Possible PFO or secundum ASD with apparent left to right flow noted by color Doppler. Cannot exclude vigorous venous return adjacent to septum. Agitated saline study could be considered for further evaluation. Trivial tricuspid regurgitation with PASP normal range.             12/2018 echo 1. The left ventricle has low normal systolic function, with an ejection fraction of 50-55%. The cavity size was normal. There is mildly increased left ventricular wall thickness. Left ventricular diastolic Doppler parameters are consistent with  impaired relaxation. Elevated mean left atrial pressure.  2. The  right ventricle has normal systolic function. The cavity was normal. There is no increase in right ventricular wall thickness.  3. Left atrial size was moderately dilated.  4. No evidence of mitral valve stenosis.  5. The aortic valve is tricuspid. No stenosis of the aortic valve.  6. The aortic root is normal in size and structure.  7. Pulmonary hypertension is indeterminant, inadequate TR jet.     06/2020 echo IMPRESSIONS     1. Left ventricular ejection fraction, by estimation, is 35 to 40%. The  left ventricle has moderately decreased function. The left ventricle  demonstrates regional wall motion abnormalities (see scoring  diagram/findings for description). There is mild  left ventricular hypertrophy. Left ventricular diastolic parameters are  indeterminate.   2. Right ventricular systolic function  is normal. The right ventricular  size is normal. Tricuspid regurgitation signal is inadequate for assessing  PA pressure.   3. Left atrial size was mild to moderately dilated.   4. The mitral valve is grossly normal. Mild mitral valve regurgitation.   5. The aortic valve is tricuspid. There is moderate calcification of the  aortic valve. Aortic valve regurgitation is not visualized. Mild aortic  valve stenosis. Aortic valve mean gradient measures 10.0 mmHg.   6. The inferior vena cava is normal in size with greater than 50%  respiratory variability, suggesting right atrial pressure of 3 mmHg.   7. Left pleural effusion is noted.     08/2020 echo IMPRESSIONS     1. Left ventricular ejection fraction, by estimation, is 40 to 45%. The  left ventricle has mildly decreased function. The left ventricle  demonstrates global hypokinesis. There is mild left ventricular  hypertrophy. Left ventricular diastolic parameters  are consistent with Grade I diastolic dysfunction (impaired relaxation).   2. Right ventricular systolic function is normal. The right ventricular  size is normal.    3. Left atrial size was severely dilated.   4. The mitral valve is normal in structure. Trivial mitral valve  regurgitation. No evidence of mitral stenosis.   5. The aortic valve is tricuspid. There is mild calcification of the  aortic valve. There is mild thickening of the aortic valve. Aortic valve  regurgitation is not visualized. No aortic stenosis is present.   6. The inferior vena cava is normal in size with greater than 50%  respiratory variability, suggesting right atrial pressure of 3 mmHg.       Assessment and Plan:  1. Chronic systolic heart failure ([place])   2. Essential hypertension   3. Mixed hyperlipidemia    1. Chronic systolic heart failure ([place]) He denies any DOE or SOB, PND, orthopnea or lower extremity edema.  Lungs are clear to auscultation all fields.  He has gained some weight since 04/19/2021 when he weighed 187.  Today he weighs 198.  He blames it on eating too many cookies and ice cream.  His creatinine had increased to 1.90 and GFR was 36.  His PCP had already decreased his Lasix dosage down to 20 mg daily and as needed.  Given his decreased renal function we will stop [place] for now.  2. Essential hypertension Blood pressure well controlled today at 128/74.  Continue carvedilol 12.5 mg p.o. twice daily.  3.  Hyperlipidemia Continue pravastatin 40 mg daily, aspirin 81 mg daily.  Medication Adjustments/Labs and Tests Ordered: Current medicines are reviewed at length with the patient today.  Concerns regarding medicines are outlined above.   Disposition: Follow-up with Dr. Harl Bowie or APP 6 months  Signed, Levell July, NP 05/11/2021 4:36 PM    [facility] [place], [place], [place] [address] Phone: [phone]; Fax: [phone]

## 2021-05-11 ENCOUNTER — Other Ambulatory Visit: Payer: Self-pay

## 2021-05-11 ENCOUNTER — Other Ambulatory Visit: Payer: Self-pay | Admitting: *Deleted

## 2021-05-11 ENCOUNTER — Ambulatory Visit (INDEPENDENT_AMBULATORY_CARE_PROVIDER_SITE_OTHER): Payer: Medicare Other | Admitting: Family Medicine

## 2021-05-11 ENCOUNTER — Encounter: Payer: Self-pay | Admitting: Family Medicine

## 2021-05-11 VITALS — BP 128/74 | HR 74 | Ht 68.0 in | Wt 198.8 lb

## 2021-05-11 DIAGNOSIS — I5022 Chronic systolic (congestive) heart failure: Secondary | ICD-10-CM | POA: Diagnosis not present

## 2021-05-11 DIAGNOSIS — E782 Mixed hyperlipidemia: Secondary | ICD-10-CM

## 2021-05-11 DIAGNOSIS — I10 Essential (primary) hypertension: Secondary | ICD-10-CM | POA: Diagnosis not present

## 2021-05-11 NOTE — Patient Instructions (Addendum)
Medication Instructions:  Your physician has recommended you make the following change in your medication:   STOP [name]    *If you need a refill on your cardiac medications before your next appointment, please call your pharmacy*   Lab Work: 2-3 WEEKS:  GO TO YOUR GP'S OFFICE AND GET A BMET AND MAGNESIUM DONE  If you have labs (blood work) drawn today and your tests are completely normal, you will receive your results only by: MyChart Message (if you have MyChart) OR A paper copy in the mail If you have any lab test that is abnormal or we need to change your treatment, we will call you to review the results.   Testing/Procedures: None ordered   Follow-Up: At [facility], you and your health needs are our priority.  As part of our continuing mission to provide you with exceptional heart care, we have created designated Provider Care Teams.  These Care Teams include your primary Cardiologist (physician) and Advanced Practice Providers (APPs -  Physician Assistants and Nurse Practitioners) who all work together to provide you with the care you need, when you need it.  We recommend signing up for the patient portal called "MyChart".  Sign up information is provided on this After Visit Summary.  MyChart is used to connect with patients for Virtual Visits (Telemedicine).  Patients are able to view lab/test results, encounter notes, upcoming appointments, etc.  Non-urgent messages can be sent to your provider as well.   To learn more about what you can do with MyChart, go to [email or web address].    Your next appointment:   6 month(s)  The format for your next appointment:   In Person  Provider:   You may see Dina Rich, MD or the following Advanced Practice Provider on your designated Care Team:   Nena Polio, NP   Other Instructions

## 2021-05-25 ENCOUNTER — Telehealth: Payer: Medicare Other | Admitting: *Deleted

## 2021-05-25 ENCOUNTER — Telehealth: Payer: Self-pay | Admitting: *Deleted

## 2021-05-25 NOTE — Telephone Encounter (Signed)
  Care Management   Follow Up Note   05/25/2021 Name: Taylor Cross MRN: [id number] DOB: 01/15/1946   Referred by: [name], MD Reason for referral : Chronic Care Management (Unsuccessful outreach)   An unsuccessful telephone outreach was attempted today. The patient was referred to the case management team for assistance with care management and care coordination.   Follow Up Plan: A HIPPA compliant phone message was left for the patient providing contact information and requesting a return call. Forwarding to [facility] Care Guide for outreach and rescheduling.   Demetrios Loll, BSN, RN-BC Embedded Chronic Care Manager [facility] Care Management Direct Dial: [phone]

## 2021-05-29 ENCOUNTER — Telehealth: Payer: Medicare Other | Admitting: *Deleted

## 2021-05-30 ENCOUNTER — Ambulatory Visit (INDEPENDENT_AMBULATORY_CARE_PROVIDER_SITE_OTHER): Payer: Medicare Other | Admitting: *Deleted

## 2021-05-30 DIAGNOSIS — I10 Essential (primary) hypertension: Secondary | ICD-10-CM

## 2021-05-30 DIAGNOSIS — J438 Other emphysema: Secondary | ICD-10-CM

## 2021-05-30 DIAGNOSIS — I5042 Chronic combined systolic (congestive) and diastolic (congestive) heart failure: Secondary | ICD-10-CM

## 2021-06-07 ENCOUNTER — Other Ambulatory Visit: Payer: Medicare Other

## 2021-06-07 ENCOUNTER — Other Ambulatory Visit: Payer: Self-pay

## 2021-06-08 ENCOUNTER — Telehealth: Payer: Self-pay | Admitting: Family Medicine

## 2021-06-08 ENCOUNTER — Telehealth: Payer: Self-pay | Admitting: *Deleted

## 2021-06-08 LAB — BASIC METABOLIC PANEL
BUN/Creatinine Ratio: 21 (ref 10–24)
BUN: 32 mg/dL — ABNORMAL HIGH (ref 8–27)
CO2: 23 mmol/L (ref 20–29)
Calcium: 9.2 mg/dL (ref 8.6–10.2)
Chloride: 102 mmol/L (ref 96–106)
Creatinine, Ser: 1.54 mg/dL — ABNORMAL HIGH (ref 0.76–1.27)
Glucose: 102 mg/dL — ABNORMAL HIGH (ref 70–99)
Potassium: 4.6 mmol/L (ref 3.5–5.2)
Sodium: 140 mmol/L (ref 134–144)
eGFR: 47 mL/min/{1.73_m2} — ABNORMAL LOW (ref >59)

## 2021-06-08 LAB — MAGNESIUM: Magnesium: 2.3 mg/dL (ref 1.6–2.3)

## 2021-06-08 NOTE — Telephone Encounter (Signed)
Left message for patient to call back and schedule Medicare Annual Wellness Visit (AWV) to be completed by video or phone.   Last AWV: 10/17/2017  Please schedule at anytime with [facility] Health Advisor.  45 minute appointment  Any questions, please contact me at [phone]

## 2021-06-08 NOTE — Telephone Encounter (Signed)
-----   Message from Netta Neat., NP sent at 06/08/2021  7:49 AM EST ----- Lab work shows kidney function is improving.  Continue current medications.  Do not restart [place].    Netta Neat, NP  06/08/2021 7:47 AM

## 2021-06-09 NOTE — Telephone Encounter (Signed)
Pt on the line returning phone call... please advise

## 2021-06-09 NOTE — Telephone Encounter (Signed)
Patient notified of test results and voiced understanding.  

## 2021-06-14 ENCOUNTER — Other Ambulatory Visit: Payer: Self-pay | Admitting: Family Medicine

## 2021-06-15 ENCOUNTER — Telehealth: Payer: Self-pay | Admitting: *Deleted

## 2021-06-15 NOTE — Telephone Encounter (Signed)
Lesle Chris, LPN  76/14/7092 10:07 AM EST Back to Top    Notified, copy to pcp.    Lesle Chris, LPN  95/74/7340  4:12 PM EST     Left message to return call.   Eustace Moore, RN  06/08/2021  4:21 PM EST     Left message for patient to call office.   Netta Neat., NP  06/08/2021  7:49 AM EST     Lab work shows kidney function is improving.  Continue current medications.  Do not restart [place].     Netta Neat, NP  06/08/2021 7:47 AM

## 2021-06-26 ENCOUNTER — Telehealth: Payer: Self-pay

## 2021-06-26 NOTE — Chronic Care Management (AMB) (Signed)
  Care Management   Note  06/26/2021 Name: Taylor Cross MRN: [id number] DOB: 1946/06/14  [name] is a 75 y.o. year old male who is a primary care patient of [name], [name], MD and is actively engaged with the care management team. I reached out to [name] by phone today to assist with re-scheduling a follow up visit with the RN Case Manager  Follow up plan: Telephone appointment with care management team member scheduled for:07/06/2021  Penne Lash, RMA Care Guide, Embedded Care Coordination [facility]  [place], [place] [address] Direct Dial: [phone] [email or web address]: [email or web address]

## 2021-06-26 NOTE — Chronic Care Management (AMB) (Signed)
  Care Management   Note  06/26/2021 Name: Taylor Cross MRN: [id number] DOB: [birth year]  Taylor Cross is a 75 y.o. year old male who is a primary care patient of [name], [name], MD and is actively engaged with the care management team. I reached out to [name] by phone today to assist with re-scheduling a follow up visit with the RN Case Manager  Follow up plan: Unsuccessful telephone outreach attempt made. A HIPAA compliant phone message was left for the patient providing contact information and requesting a return call.  The care management team will reach out to the patient again over the next 7 days.  If patient returns call to provider office, please advise to call Embedded Care Management Care Guide Taylor Cross  at [phone]  Taylor Cross, RMA Care Guide, Embedded Care Coordination [facility]  [place], [place] [address] Direct Dial: [phone] [email or web address]

## 2021-06-28 DIAGNOSIS — J438 Other emphysema: Secondary | ICD-10-CM

## 2021-06-28 DIAGNOSIS — I5042 Chronic combined systolic (congestive) and diastolic (congestive) heart failure: Secondary | ICD-10-CM

## 2021-06-28 DIAGNOSIS — I10 Essential (primary) hypertension: Secondary | ICD-10-CM

## 2021-06-29 ENCOUNTER — Telehealth: Payer: Medicare Other

## 2021-07-03 NOTE — Chronic Care Management (AMB) (Signed)
Chronic Care Management   CCM RN Visit Note  05/30/2021 Name: Taylor Cross MRN: [id number] DOB: [birth year]  Subjective: Taylor Cross is a 75 y.o. year old male who is a primary care patient of [name], Cletus Gash, MD. The care management team was consulted for assistance with disease management and care coordination needs.    Engaged with patient by telephone for follow up visit in response to provider referral for case management and/or care coordination services.   Consent to Services:  The patient was given information about Chronic Care Management services, agreed to services, and gave verbal consent prior to initiation of services.  Please see initial visit note for detailed documentation.   Patient agreed to services and verbal consent obtained.   Assessment: Review of patient past medical history, allergies, medications, health status, including review of consultants reports, laboratory and other test data, was performed as part of comprehensive evaluation and provision of chronic care management services.   SDOH (Social Determinants of Health) assessments and interventions performed:    CCM Care Plan  Allergies  Allergen Reactions   Neosporin [Neomycin-Bacitracin Zn-Polymyx] Rash   Voltaren [Diclofenac Sodium] Other (See Comments)    GI upset    Outpatient Encounter Medications as of 05/30/2021  Medication Sig   acetaminophen (TYLENOL) 325 MG tablet Take 2 tablets (650 mg total) by mouth every 6 (six) hours as needed for mild pain, fever or headache (or Fever >/= 101).   allopurinol (ZYLOPRIM) 100 MG tablet Take 1 tablet (100 mg total) by mouth daily.   aspirin 81 MG chewable tablet Chew 1 tablet (81 mg total) by mouth daily with breakfast.   carvedilol (COREG) 12.5 MG tablet Take 1 tablet (12.5 mg total) by mouth 2 (two) times daily.   finasteride (PROSCAR) 5 MG tablet Take 1 tablet (5 mg total) by mouth daily.   fluticasone (FLONASE) 50 MCG/ACT nasal spray Place 2 sprays  into both nostrils daily. (Patient taking differently: Place 2 sprays into both nostrils at bedtime.)   furosemide (LASIX) 40 MG tablet Take 20 mg by mouth daily.   hydroquinone 4 % cream APPLY TO AFFECTED AREAS TWICE A DAY   loratadine (CLARITIN) 10 MG tablet TAKE ONE TABLET AT BEDTIME (Patient taking differently: Take 10 mg by mouth at bedtime.)   mupirocin ointment (BACTROBAN) 2 % Apply 2 (two) times daily. To affected areas   pantoprazole (PROTONIX) 40 MG tablet Take 1 tablet (40 mg total) by mouth daily. TAKE 1 TABLET ONCE DAILY FOR REFLUX (Patient taking differently: Take 40 mg by mouth daily.)   pravastatin (PRAVACHOL) 40 MG tablet Take 1 tablet (40 mg total) by mouth daily.   sertraline (ZOLOFT) 50 MG tablet Take 1 tablet (50 mg total) by mouth daily.   tamsulosin (FLOMAX) 0.4 MG CAPS capsule TAKE (2) CAPSULES DAILY.   [DISCONTINUED] gabapentin (NEURONTIN) 300 MG capsule TAKE (1) CAPSULE TWICE DAILY. (Patient taking differently: TAKE  (1)  CAPSULE  TWICE DAILY.)   No facility-administered encounter medications on file as of 05/30/2021.    Patient Active Problem List   Diagnosis Date Noted   Benign prostatic hyperplasia with nocturia 10/20/2019   Spondylosis of lumbar region without myelopathy or radiculopathy 11/10/2017   Gastroesophageal reflux disease with esophagitis 06/11/2017   Hernia of abdominal cavity 08/16/2015   Essential hypertension 08/04/2015   Inguinal hernia 08/04/2015   Snoring 02/04/2014   Emphysema lung ([place]) 77/41/2878   Systolic dysfunction 67/67/2094   Diastolic dysfunction 70/96/2836   PVC's (premature ventricular contractions)  10/20/2013   Mixed hyperlipidemia 10/20/2013    Conditions to be addressed/monitored:CHF, HTN, and COPD   Care Plan : [facility] Care Plan   Problem: Chronic Disease Management Needs   Priority: High  Onset Date: 05/30/2021     Long-Range Goal: Work with RN Care Manager Regarding Care Management and [place]  with HTN, CHF, and COPD   Start Date: 05/30/2021  Expected End Date: 05/30/2022  This Visit's Progress: On track  Priority: High  Note:   Current Barriers:  Chronic Disease Management support and education needs related to CHF, HTN, and Pulmonary Disease  RNCM Clinical Goal(s):  Patient will continue to work with RN Care Manager and/or Social Worker to address care management and care coordination needs related to CHF, HTN, and Pulmonary Disease as evidenced by adherence to CM Team Scheduled appointments     through collaboration with RN Care manager, provider, and care team.   Interventions: 1:1 collaboration with primary care provider regarding development and update of comprehensive plan of care as evidenced by provider attestation and co-signature Inter-disciplinary care team collaboration (see longitudinal plan of care) Evaluation of current treatment plan related to  self management and patient's adherence to plan as established by provider   Heart Failure Interventions:  (Status: Goal on Track (progressing): YES.)  Long Term Goal  Basic overview and discussion of pathophysiology of Heart Failure reviewed Assessed need for readable accurate scales in home Provided education about placing scale on hard, flat surface Advised patient to weigh each morning after emptying bladder Discussed importance of daily weight and advised patient to weigh and record daily Reviewed role of diuretics in prevention of fluid overload and management of heart failure Discussed the importance of keeping all appointments with provider Provided patient with education about the role of exercise in the management of heart failure Assessed social determinant of health barriers  COPD: (Status: Goal on Track (progressing): YES.) Long Term Goal  Reviewed medications with patient, including use of prescribed maintenance and rescue inhalers, and provided instruction on medication management and the importance of  adherence Provided patient with basic written and verbal COPD education on self care/management/and exacerbation prevention Advised patient to track and manage COPD triggers Provided written and verbal instructions on pursed lip breathing and utilized returned demonstration as teach back Provided instruction about proper use of medications used for management of COPD including inhalers Advised patient to self assesses COPD action plan zone and make appointment with provider if in the yellow zone for 48 hours without improvement Advised patient to engage in light exercise as tolerated 3-5 days a week to aid in the the management of COPD Assessed social determinant of health barriers  Hypertension: (Status: Goal on Track (progressing): YES.) Last practice recorded BP readings:  BP Readings from Last 3 Encounters:  05/11/21 128/74  04/19/21 107/64  04/13/21 108/68  Most recent eGFR/CrCl:  Lab Results  Component Value Date   EGFR 47 (L) 06/07/2021    No components found for: CRCL  Evaluation of current treatment plan related to hypertension self management and patient's adherence to plan as established by provider;   Reviewed medications with patient and discussed importance of compliance;  Counseled on the importance of exercise goals with target of 150 minutes per week Advised patient, providing education and rationale, to monitor blood pressure daily and record, calling PCP for findings outside established parameters;  Discussed complications of poorly controlled blood pressure such as heart disease, stroke, circulatory complications, vision complications, kidney impairment, sexual  dysfunction;  Assessed social determinant of health barriers;   Patient Goals/Self-Care Activities: Take medications as prescribed   Attend all scheduled provider appointments Perform all self care activities independently  Perform IADL's (shopping, preparing meals, housekeeping, managing finances)  independently Call provider office for new concerns or questions  check blood pressure 3 times per week write blood pressure results in a log or diary take blood pressure log to all doctor appointments call doctor for signs and symptoms of high blood pressure begin an exercise program report new symptoms to your doctor eat more whole grains, fruits and vegetables, lean meats and healthy fats  Plan:Telephone follow up appointment with care management team member scheduled for:  07/06/2021 with RNCM The patient has been provided with contact information for the care management team and has been advised to call with any health related questions or concerns.   Chong Sicilian, BSN, RN-BC Embedded Chronic Care Manager [facility] / [place] [facility] Direct Dial: [phone]

## 2021-07-03 NOTE — Patient Instructions (Signed)
Visit Information  Patient Goals/Self-Care Activities: Take medications as prescribed   Attend all scheduled provider appointments Perform all self care activities independently  Perform IADL's (shopping, preparing meals, housekeeping, managing finances) independently Call provider office for new concerns or questions  check blood pressure 3 times per week write blood pressure results in a log or diary take blood pressure log to all doctor appointments call doctor for signs and symptoms of high blood pressure begin an exercise program report new symptoms to your doctor eat more whole grains, fruits and vegetables, lean meats and healthy fats  Patient verbalizes understanding of instructions provided today and agrees to view in MyChart.   Plan:Telephone follow up appointment with care management team member scheduled for:  07/06/2021 with RNCM The patient has been provided with contact information for the care management team and has been advised to call with any health related questions or concerns.   Demetrios Loll, BSN, RN-BC Embedded Chronic Care Manager [facility] / [facility] Care Management Direct Dial: [phone]

## 2021-07-06 ENCOUNTER — Ambulatory Visit: Payer: Medicare Other | Admitting: *Deleted

## 2021-07-06 ENCOUNTER — Encounter: Payer: Self-pay | Admitting: *Deleted

## 2021-07-06 DIAGNOSIS — I5042 Chronic combined systolic (congestive) and diastolic (congestive) heart failure: Secondary | ICD-10-CM

## 2021-07-06 DIAGNOSIS — J438 Other emphysema: Secondary | ICD-10-CM

## 2021-07-06 DIAGNOSIS — I10 Essential (primary) hypertension: Secondary | ICD-10-CM

## 2021-07-06 NOTE — Patient Instructions (Signed)
Visit Information  Patient Goals/Self-Care Activities: Take medications as prescribed   Attend all scheduled provider appointments Perform all self care activities independently  Perform IADL's (shopping, preparing meals, housekeeping, managing finances) independently Call provider office for new concerns or questions  check blood pressure 3 times per week write blood pressure results in a log or diary take blood pressure log to all doctor appointments call doctor for signs and symptoms of high blood pressure begin an exercise program report new symptoms to your doctor eat more whole grains, fruits and vegetables, lean meats and healthy fats Increase activity level with a goal of 150 minutes a week Talk with PCP about whether a pneumovax is appropriate for you Call RN Care Manager as needed [phone]  Patient verbalizes understanding of instructions provided today and agrees to view in MyChart.   Plan:Telephone follow up appointment with care management team member scheduled for:  08/08/21 with RNCM The patient has been provided with contact information for the care management team and has been advised to call with any health related questions or concerns.   Demetrios Loll, BSN, RN-BC Embedded Chronic Care Manager [facility] / [facility] Care Management Direct Dial: [phone]

## 2021-07-06 NOTE — Chronic Care Management (AMB) (Signed)
Chronic Care Management   CCM RN Visit Note  07/06/2021 Name: Taylor Cross MRN: [id number] DOB: October 25, 1945  Subjective: Taylor Cross is a 75 y.o. year old male who is a primary care patient of [name], Cletus Gash, MD. The care management team was consulted for assistance with disease management and care coordination needs.    Engaged with patient by telephone for follow up visit in response to provider referral for case management and/or care coordination services.   Consent to Services:  The patient was given information about Chronic Care Management services, agreed to services, and gave verbal consent prior to initiation of services.  Please see initial visit note for detailed documentation.   Patient agreed to services and verbal consent obtained.   Assessment: Review of patient past medical history, allergies, medications, health status, including review of consultants reports, laboratory and other test data, was performed as part of comprehensive evaluation and provision of chronic care management services.   SDOH (Social Determinants of Health) assessments and interventions performed:    CCM Care Plan  Allergies  Allergen Reactions   Neosporin [Neomycin-Bacitracin Zn-Polymyx] Rash   Voltaren [Diclofenac Sodium] Other (See Comments)    GI upset    Outpatient Encounter Medications as of 07/06/2021  Medication Sig   acetaminophen (TYLENOL) 325 MG tablet Take 2 tablets (650 mg total) by mouth every 6 (six) hours as needed for mild pain, fever or headache (or Fever >/= 101).   allopurinol (ZYLOPRIM) 100 MG tablet Take 1 tablet (100 mg total) by mouth daily.   aspirin 81 MG chewable tablet Chew 1 tablet (81 mg total) by mouth daily with breakfast.   carvedilol (COREG) 12.5 MG tablet Take 1 tablet (12.5 mg total) by mouth 2 (two) times daily.   finasteride (PROSCAR) 5 MG tablet Take 1 tablet (5 mg total) by mouth daily.   fluticasone (FLONASE) 50 MCG/ACT nasal spray Place 2 sprays  into both nostrils daily. (Patient taking differently: Place 2 sprays into both nostrils at bedtime.)   furosemide (LASIX) 40 MG tablet Take 20 mg by mouth daily.   gabapentin (NEURONTIN) 300 MG capsule TAKE (1) CAPSULE TWICE DAILY.   hydroquinone 4 % cream APPLY TO AFFECTED AREAS TWICE A DAY   loratadine (CLARITIN) 10 MG tablet TAKE ONE TABLET AT BEDTIME (Patient taking differently: Take 10 mg by mouth at bedtime.)   mupirocin ointment (BACTROBAN) 2 % Apply 2 (two) times daily. To affected areas   pantoprazole (PROTONIX) 40 MG tablet Take 1 tablet (40 mg total) by mouth daily. TAKE 1 TABLET ONCE DAILY FOR REFLUX (Patient taking differently: Take 40 mg by mouth daily.)   pravastatin (PRAVACHOL) 40 MG tablet Take 1 tablet (40 mg total) by mouth daily.   sertraline (ZOLOFT) 50 MG tablet Take 1 tablet (50 mg total) by mouth daily.   tamsulosin (FLOMAX) 0.4 MG CAPS capsule TAKE (2) CAPSULES DAILY.   No facility-administered encounter medications on file as of 07/06/2021.    Patient Active Problem List   Diagnosis Date Noted   Benign prostatic hyperplasia with nocturia 10/20/2019   Spondylosis of lumbar region without myelopathy or radiculopathy 11/10/2017   Gastroesophageal reflux disease with esophagitis 06/11/2017   Hernia of abdominal cavity 08/16/2015   Essential hypertension 08/04/2015   Inguinal hernia 08/04/2015   Snoring 02/04/2014   Emphysema lung ([place]) [id number]   Systolic dysfunction A999333   Diastolic dysfunction A999333   PVC's (premature ventricular contractions) 10/20/2013   Mixed hyperlipidemia 10/20/2013    Conditions to be  addressed/monitored:CHF, HTN, and COPD  Care Plan : [facility] Care Plan  Updates made by Ilean China, RN since 07/06/2021 12:00 AM     Problem: Chronic Disease Management Needs   Priority: High  Onset Date: 05/30/2021     Long-Range Goal: Work with RN Care Manager Regarding Care Management and [place] with HTN, CHF,  and COPD   Start Date: 05/30/2021  Expected End Date: 05/30/2022  This Visit's Progress: On track  Recent Progress: On track  Priority: High  Note:   Current Barriers:  Chronic Disease Management support and education needs related to CHF, HTN, and Pulmonary Disease  RNCM Clinical Goal(s):  Patient will continue to work with RN Care Manager and/or Social Worker to address care management and care coordination needs related to CHF, HTN, and Pulmonary Disease as evidenced by adherence to CM Team Scheduled appointments     through collaboration with RN Care manager, provider, and care team.   Interventions: 1:1 collaboration with primary care provider regarding development and update of comprehensive plan of care as evidenced by provider attestation and co-signature Inter-disciplinary care team collaboration (see longitudinal plan of care) Evaluation of current treatment plan related to  self management and patient's adherence to plan as established by provider   Heart Failure Interventions:  (Status: Goal on Track (progressing): YES.)  Long Term Goal  Advised patient to weigh each morning after emptying bladder Discussed importance of daily weight and advised patient to weigh and record daily Reviewed role of diuretics in prevention of fluid overload and management of heart failure Discussed the importance of keeping all appointments with provider Provided patient with education about the role of exercise in the management of heart failure Assessed social determinant of health barriers Reviewed and discussed medications and importance of compliance Encouraged patient to reach out to cardiologist or PCP with any new symptoms Encouraged increased activity level as tolerated with a goal of 150 minutes a week  COPD: (Status: Goal on Track (progressing): YES.) Long Term Goal  Reviewed medications with patient, including use of prescribed maintenance and rescue inhalers, and provided instruction  on medication management and the importance of adherence Advised patient to track and manage COPD triggers Advised patient to self assesses COPD action plan zone and make appointment with provider if in the yellow zone for 48 hours without improvement Advised patient to engage in light exercise as tolerated 3-5 days a week to aid in the the management of COPD Provided education about and advised patient to utilize infection prevention strategies to reduce risk of respiratory infection Assessed social determinant of health barriers Reviewed health maintenance recommendations and discussed Covid, pneumonia, and flu vaccines. Patient has had yearly flu vaccine. He has had both prevnar and pneumovax with Prevnar being the most recent in 2017. Advised to discuss with PCP if a pneumovax is needed after 5 years.  Hypertension: (Status: Goal on Track (progressing): YES.) Last practice recorded BP readings:  BP Readings from Last 3 Encounters:  05/11/21 128/74  04/19/21 107/64  04/13/21 108/68  Evaluation of current treatment plan related to hypertension self management and patient's adherence to plan as established by provider;   Reviewed medications with patient and discussed importance of compliance;  Counseled on the importance of exercise goals with target of 150 minutes per week Advised patient, providing education and rationale, to monitor blood pressure daily and record, calling PCP for findings outside established parameters;  Discussed complications of poorly controlled blood pressure such as heart disease, stroke,  circulatory complications, vision complications, kidney impairment, sexual dysfunction;  Assessed social determinant of health barriers;   Patient Goals/Self-Care Activities: Take medications as prescribed   Attend all scheduled provider appointments Perform all self care activities independently  Perform IADL's (shopping, preparing meals, housekeeping, managing finances)  independently Call provider office for new concerns or questions  check blood pressure 3 times per week write blood pressure results in a log or diary take blood pressure log to all doctor appointments call doctor for signs and symptoms of high blood pressure begin an exercise program report new symptoms to your doctor eat more whole grains, fruits and vegetables, lean meats and healthy fats Increase activity level with a goal of 150 minutes a week Talk with PCP about whether a pneumovax is appropriate for you Call RN Care Manager as needed [phone]  Plan:Telephone follow up appointment with care management team member scheduled for:  08/08/21 with RNCM The patient has been provided with contact information for the care management team and has been advised to call with any health related questions or concerns.   Demetrios Loll, BSN, RN-BC Embedded Chronic Care Manager [facility] / [facility] Care Management Direct Dial: [phone]

## 2021-07-26 ENCOUNTER — Ambulatory Visit (INDEPENDENT_AMBULATORY_CARE_PROVIDER_SITE_OTHER): Payer: Medicare Other

## 2021-07-26 VITALS — Ht 68.0 in | Wt 195.0 lb

## 2021-07-26 DIAGNOSIS — Z0000 Encounter for general adult medical examination without abnormal findings: Secondary | ICD-10-CM

## 2021-07-26 NOTE — Patient Instructions (Addendum)
[name] , Thank you for taking time to come for your Medicare Wellness Visit. I appreciate your ongoing commitment to your health goals. Please review the following plan we discussed and let me know if I can assist you in the future.   Screening recommendations/referrals: Colonoscopy: No longer required due to age.   Recommended yearly ophthalmology/optometry visit for glaucoma screening and checkup Recommended yearly dental visit for hygiene and checkup  Vaccinations: Influenza vaccine: Done 04/19/2021. Repeat annually  Pneumococcal vaccine: Done 04/07/2013 and 04/06/2016 Tdap vaccine: Done 10/30/2017 Repeat in 10 years  Shingles vaccine: Done 05/08/2018. Second dose due.    Covid-19: Done 09/14/2019, 10/12/2019, 05/31/2020 and 02/28/2021.  Advanced directives: Advance directive discussed with you today. Even though you declined this today, please call our office should you change your mind, and we can give you the proper paperwork for you to fill out.   Conditions/risks identified: Aim for 30 minutes of exercise or brisk walking each day, drink 6-8 glasses of water and eat lots of fruits and vegetables.   Next appointment: Follow up in one year for your annual wellness visit. 08/01/2022 @ 2:45 pm.  Preventive Care 65 Years and Older, Male  Preventive care refers to lifestyle choices and visits with your health care provider that can promote health and wellness. What does preventive care include? A yearly physical exam. This is also called an annual well check. Dental exams once or twice a year. Routine eye exams. Ask your health care provider how often you should have your eyes checked. Personal lifestyle choices, including: Daily care of your teeth and gums. Regular physical activity. Eating a healthy diet. Avoiding tobacco and drug use. Limiting alcohol use. Practicing safe sex. Taking low doses of aspirin every day. Taking vitamin and mineral supplements as recommended by your  health care provider. What happens during an annual well check? The services and screenings done by your health care provider during your annual well check will depend on your age, overall health, lifestyle risk factors, and family history of disease. Counseling  Your health care provider may ask you questions about your: Alcohol use. Tobacco use. Drug use. Emotional well-being. Home and relationship well-being. Sexual activity. Eating habits. History of falls. Memory and ability to understand (cognition). Work and work Astronomer. Screening  You may have the following tests or measurements: Height, weight, and BMI. Blood pressure. Lipid and cholesterol levels. These may be checked every 5 years, or more frequently if you are over 49 years old. Skin check. Lung cancer screening. You may have this screening every year starting at age 58 if you have a 30-pack-year history of smoking and currently smoke or have quit within the past 15 years. Fecal occult blood test (FOBT) of the stool. You may have this test every year starting at age 53. Flexible sigmoidoscopy or colonoscopy. You may have a sigmoidoscopy every 5 years or a colonoscopy every 10 years starting at age 5. Prostate cancer screening. Recommendations will vary depending on your family history and other risks. Hepatitis C blood test. Hepatitis B blood test. Sexually transmitted disease (STD) testing. Diabetes screening. This is done by checking your blood sugar (glucose) after you have not eaten for a while (fasting). You may have this done every 1-3 years. Abdominal aortic aneurysm (AAA) screening. You may need this if you are a current or former smoker. Osteoporosis. You may be screened starting at age 29 if you are at high risk. Talk with your health care provider about your test  results, treatment options, and if necessary, the need for more tests. Vaccines  Your health care provider may recommend certain vaccines, such  as: Influenza vaccine. This is recommended every year. Tetanus, diphtheria, and acellular pertussis (Tdap, Td) vaccine. You may need a Td booster every 10 years. Zoster vaccine. You may need this after age 21. Pneumococcal 13-valent conjugate (PCV13) vaccine. One dose is recommended after age 6. Pneumococcal polysaccharide (PPSV23) vaccine. One dose is recommended after age 53. Talk to your health care provider about which screenings and vaccines you need and how often you need them. This information is not intended to replace advice given to you by your health care provider. Make sure you discuss any questions you have with your health care provider. Document Released: 08/12/2015 Document Revised: 04/04/2016 Document Reviewed: 05/17/2015 Elsevier Interactive Patient Education  2017 [employer] Prevention in the Home Falls can cause injuries. They can happen to people of all ages. There are many things you can do to make your home safe and to help prevent falls. What can I do on the outside of my home? Regularly fix the edges of walkways and driveways and fix any cracks. Remove anything that might make you trip as you walk through a door, such as a raised step or threshold. Trim any bushes or trees on the path to your home. Use bright outdoor lighting. Clear any walking paths of anything that might make someone trip, such as rocks or tools. Regularly check to see if handrails are loose or broken. Make sure that both sides of any steps have handrails. Any raised decks and porches should have guardrails on the edges. Have any leaves, snow, or ice cleared regularly. Use sand or salt on walking paths during winter. Clean up any spills in your garage right away. This includes oil or grease spills. What can I do in the bathroom? Use night lights. Install grab bars by the toilet and in the tub and shower. Do not use towel bars as grab bars. Use non-skid mats or decals in the tub or  shower. If you need to sit down in the shower, use a plastic, non-slip stool. Keep the floor dry. Clean up any water that spills on the floor as soon as it happens. Remove soap buildup in the tub or shower regularly. Attach bath mats securely with double-sided non-slip rug tape. Do not have throw rugs and other things on the floor that can make you trip. What can I do in the bedroom? Use night lights. Make sure that you have a light by your bed that is easy to reach. Do not use any sheets or blankets that are too big for your bed. They should not hang down onto the floor. Have a firm chair that has side arms. You can use this for support while you get dressed. Do not have throw rugs and other things on the floor that can make you trip. What can I do in the kitchen? Clean up any spills right away. Avoid walking on wet floors. Keep items that you use a lot in easy-to-reach places. If you need to reach something above you, use a strong step stool that has a grab bar. Keep electrical cords out of the way. Do not use floor polish or wax that makes floors slippery. If you must use wax, use non-skid floor wax. Do not have throw rugs and other things on the floor that can make you trip. What can I do with my stairs?  Do not leave any items on the stairs. Make sure that there are handrails on both sides of the stairs and use them. Fix handrails that are broken or loose. Make sure that handrails are as long as the stairways. Check any carpeting to make sure that it is firmly attached to the stairs. Fix any carpet that is loose or worn. Avoid having throw rugs at the top or bottom of the stairs. If you do have throw rugs, attach them to the floor with carpet tape. Make sure that you have a light switch at the top of the stairs and the bottom of the stairs. If you do not have them, ask someone to add them for you. What else can I do to help prevent falls? Wear shoes that: Do not have high heels. Have  rubber bottoms. Are comfortable and fit you well. Are closed at the toe. Do not wear sandals. If you use a stepladder: Make sure that it is fully opened. Do not climb a closed stepladder. Make sure that both sides of the stepladder are locked into place. Ask someone to hold it for you, if possible. Clearly mark and make sure that you can see: Any grab bars or handrails. First and last steps. Where the edge of each step is. Use tools that help you move around (mobility aids) if they are needed. These include: Canes. Walkers. Scooters. Crutches. Turn on the lights when you go into a dark area. Replace any light bulbs as soon as they burn out. Set up your furniture so you have a clear path. Avoid moving your furniture around. If any of your floors are uneven, fix them. If there are any pets around you, be aware of where they are. Review your medicines with your doctor. Some medicines can make you feel dizzy. This can increase your chance of falling. Ask your doctor what other things that you can do to help prevent falls. This information is not intended to replace advice given to you by your health care provider. Make sure you discuss any questions you have with your health care provider. Document Released: 05/12/2009 Document Revised: 12/22/2015 Document Reviewed: 08/20/2014 Elsevier Interactive Patient Education  2017 [employer].

## 2021-07-26 NOTE — Progress Notes (Signed)
Subjective:   Taylor Cross is a 75 y.o. male who presents for Medicare Annual/Subsequent preventive examination. Virtual Visit via Telephone Note  I connected with  Taylor Cross on 07/26/21 at  2:45 PM EST by telephone and verified that I am speaking with the correct person using two identifiers.  Location: Patient: HOME Provider: [facility] Persons participating in the virtual visit: patient/Nurse Health Advisor   I discussed the limitations, risks, security and privacy concerns of performing an evaluation and management service by telephone and the availability of in person appointments. The patient expressed understanding and agreed to proceed.  Interactive audio and video telecommunications were attempted between this nurse and patient, however failed, due to patient having technical difficulties OR patient did not have access to video capability.  We continued and completed visit with audio only.  Some vital signs may be absent or patient reported.   Darral Dash, LPN  Review of Systems     Cardiac Risk Factors include: advanced age (>11men, >65 women);hypertension;sedentary lifestyle;male gender;dyslipidemia     Objective:    Today's Vitals   07/26/21 1440 07/26/21 1441  Weight: 195 lb (88.5 kg)   Height: 5\' 8"  (1.727 m)   PainSc:  5    Body mass index is 29.65 kg/m.  Advanced Directives 07/26/2021 04/09/2021 07/19/2020 07/19/2020 10/17/2017  Does Patient Have a Medical Advance Directive? No No No Unable to assess, patient is non-responsive or altered mental status No  Would patient like information on creating a medical advance directive? No - Patient declined No - Patient declined No - Patient declined - No - Patient declined    Current Medications (verified) Outpatient Encounter Medications as of 07/26/2021  Medication Sig   acetaminophen (TYLENOL) 325 MG tablet Take 2 tablets (650 mg total) by mouth every 6 (six) hours as needed for mild pain, fever or  headache (or Fever >/= 101).   allopurinol (ZYLOPRIM) 100 MG tablet Take 1 tablet (100 mg total) by mouth daily.   aspirin 81 MG chewable tablet Chew 1 tablet (81 mg total) by mouth daily with breakfast.   finasteride (PROSCAR) 5 MG tablet Take 1 tablet (5 mg total) by mouth daily.   fluticasone (FLONASE) 50 MCG/ACT nasal spray Place 2 sprays into both nostrils daily. (Patient taking differently: Place 2 sprays into both nostrils at bedtime.)   furosemide (LASIX) 40 MG tablet Take 20 mg by mouth daily.   gabapentin (NEURONTIN) 300 MG capsule TAKE (1) CAPSULE TWICE DAILY.   hydroquinone 4 % cream APPLY TO AFFECTED AREAS TWICE A DAY   loratadine (CLARITIN) 10 MG tablet TAKE ONE TABLET AT BEDTIME (Patient taking differently: Take 10 mg by mouth at bedtime.)   mupirocin ointment (BACTROBAN) 2 % Apply 2 (two) times daily. To affected areas   pantoprazole (PROTONIX) 40 MG tablet Take 1 tablet (40 mg total) by mouth daily. TAKE 1 TABLET ONCE DAILY FOR REFLUX (Patient taking differently: Take 40 mg by mouth daily.)   pravastatin (PRAVACHOL) 40 MG tablet Take 1 tablet (40 mg total) by mouth daily.   sertraline (ZOLOFT) 50 MG tablet Take 1 tablet (50 mg total) by mouth daily.   tamsulosin (FLOMAX) 0.4 MG CAPS capsule TAKE (2) CAPSULES DAILY.   carvedilol (COREG) 12.5 MG tablet Take 1 tablet (12.5 mg total) by mouth 2 (two) times daily.   No facility-administered encounter medications on file as of 07/26/2021.    Allergies (verified) Neosporin [neomycin-bacitracin zn-polymyx] and Voltaren [diclofenac sodium]   History: Past  Medical History:  Diagnosis Date   Acute respiratory failure with hypoxia and hypercapnia (HCC) 07/19/2020   Acute systolic CHF (congestive heart failure) (HCC) 07/19/2020   Combined systolic and diastolic heart failure (HCC)    Hyperlipidemia    Past Surgical History:  Procedure Laterality Date   HERNIA REPAIR     TONSILLECTOMY     Family History  Problem Relation Age of  Onset   Heart disease Mother    Diabetes Mother    Heart failure Mother    COPD Father    Heart attack Brother 40   Social History   Socioeconomic History   Marital status: Single    Spouse name: Not on file   Number of children: 0   Years of education: some college   Highest education level: Some college, no degree  Occupational History   Occupation: Retired    Comment: Probation officer  Tobacco Use   Smoking status: Never   Smokeless tobacco: Never  Building services engineer Use: Never used  Substance and Sexual Activity   Alcohol use: No    Alcohol/week: 0.0 standard drinks   Drug use: No   Sexual activity: Not Currently  Other Topics Concern   Not on file  Social History Narrative   Not on file   Social Determinants of Health   Financial Resource Strain: Low Risk    Difficulty of Paying Living Expenses: Not hard at all  Food Insecurity: No Food Insecurity   Worried About Programme researcher, broadcasting/film/video in the Last Year: Never true   Ran Out of Food in the Last Year: Never true  Transportation Needs: No Transportation Needs   Lack of Transportation (Medical): No   Lack of Transportation (Non-Medical): No  Physical Activity: Insufficiently Active   Days of Exercise per Week: 3 days   Minutes of Exercise per Session: 20 min  Stress: No Stress Concern Present   Feeling of Stress : Not at all  Social Connections: Moderately Integrated   Frequency of Communication with Friends and Family: More than three times a week   Frequency of Social Gatherings with Friends and Family: Once a week   Attends Religious Services: More than 4 times per year   Active Member of [employer] or Organizations: Yes   Attends Engineer, structural: More than 4 times per year   Marital Status: Never married    Tobacco Counseling Counseling given: Not Answered   Clinical Intake:  Pre-visit preparation completed: Yes  Pain : 0-10 Pain Score: 5  Pain Type: Chronic pain Pain Location:  Back Pain Descriptors / Indicators: Aching Pain Onset: More than a month ago Pain Frequency: Intermittent     BMI - recorded: 29.65 Nutritional Status: BMI 25 -29 Overweight Nutritional Risks: None Diabetes: No  How often do you need to have someone help you when you read instructions, pamphlets, or other written materials from your doctor or pharmacy?: 1 - Never  Diabetic?No  Interpreter Needed?: No  Information entered by :: MJ Nahum Sherrer, LPN   Activities of Daily Living In your present state of health, do you have any difficulty performing the following activities: 07/26/2021  Hearing? N  Vision? N  Difficulty concentrating or making decisions? N  Walking or climbing stairs? N  Dressing or bathing? N  Doing errands, shopping? N  Preparing Food and eating ? N  Using the Toilet? N  In the past six months, have you accidently leaked urine? N  Do you have problems  with loss of bowel control? N  Managing your Medications? N  Managing your Finances? N  Housekeeping or managing your Housekeeping? N  Some recent data might be hidden    Patient Care Team: [name], MD as PCP - General (Family Medicine) Wyline Mood, Dorothe Pea, MD as PCP - Cardiology (Cardiology) [name], RN as Case Manager  Indicate any recent Medical Services you may have received from other than [facility] providers in the past year (date may be approximate).     Assessment:   This is a routine wellness examination for Krishav.  Hearing/Vision screen Hearing Screening - Comments:: Hearing issues in R ear.  Vision Screening - Comments:: No glasses. Dr. Nile Riggs. 2020.  Dietary issues and exercise activities discussed: Current Exercise Habits: Home exercise routine, Type of exercise: walking, Time (Minutes): 20, Frequency (Times/Week): 3, Weekly Exercise (Minutes/Week): 60, Intensity: Mild, Exercise limited by: cardiac condition(s);respiratory conditions(s)   Goals Addressed             This  Visit's Progress    Exercise 150 min/wk Moderate Activity   Not on track      Depression Screen PHQ 2/9 Scores 07/26/2021 04/19/2021 01/31/2021 10/19/2020 10/19/2020 08/05/2020 04/21/2020  PHQ - 2 Score 1 0 0 0 0 0 0  PHQ- 9 Score - - - 0 - - -    Fall Risk Fall Risk  07/26/2021 04/19/2021 04/19/2021 01/31/2021 10/19/2020  Falls in the past year? 0 1 0 0 1  Comment - - - - -  Number falls in past yr: 0 0 - - 0  Injury with Fall? 0 0 - - 0  Risk for fall due to : History of fall(s);Impaired balance/gait;Impaired mobility History of fall(s) - - History of fall(s)  Follow up Falls prevention discussed Falls evaluation completed - - Falls evaluation completed    FALL RISK PREVENTION PERTAINING TO THE HOME:  Any stairs in or around the home? Yes  If so, are there any without handrails? No  Home free of loose throw rugs in walkways, pet beds, electrical cords, etc? Yes  Adequate lighting in your home to reduce risk of falls? Yes   ASSISTIVE DEVICES UTILIZED TO PREVENT FALLS:  Life alert? No  Use of a cane, walker or w/c? No  Grab bars in the bathroom? Yes  Shower chair or bench in shower? Yes  Elevated toilet seat or a handicapped toilet? No   TIMED UP AND GO:  Was the test performed? No .  Phone visit.   Cognitive Function: MMSE - Mini Mental State Exam 10/17/2017  Orientation to time 5  Orientation to Place 5  Registration 3  Attention/ Calculation 5  Recall 3  Language- name 2 objects 1  Language- repeat 1  Language- follow 3 step command 3  Language- read & follow direction 1  Copy design 1     6CIT Screen 07/26/2021  What Year? 0 points  What month? 0 points  What time? 0 points  Count back from 20 0 points  Months in reverse 2 points  Repeat phrase 0 points  Total Score 2    Immunizations Immunization History  Administered Date(s) Administered   Fluad Quad(high Dose 65+) 04/16/2019, 04/21/2020, 04/19/2021   Influenza, Seasonal, Injecte, Preservative Fre  05/08/2018   Influenza,inj,Quad PF,6+ Mos 05/02/2015, 04/29/2017   Influenza,inj,quad, With Preservative 04/24/2016   Influenza-Unspecified 04/29/2013, 05/05/2014, 05/02/2015, 04/24/2016, 05/08/2018   Moderna Sars-Covid-2 Vaccination 09/14/2019, 10/12/2019, 05/31/2020, 02/28/2021   Pneumococcal Conjugate-13 04/06/2016   Pneumococcal Polysaccharide-23  04/07/2013   Tdap 10/06/2013, 10/30/2017   Zoster Recombinat (Shingrix) 05/08/2018    TDAP status: Up to date  Flu Vaccine status: Up to date  Pneumococcal vaccine status: Up to date  Covid-19 vaccine status: Completed vaccines  Qualifies for Shingles Vaccine? Yes   Zostavax completed Yes   Shingrix Completed?: No.    Education has been provided regarding the importance of this vaccine. Patient has been advised to call insurance company to determine out of pocket expense if they have not yet received this vaccine. Advised may also receive vaccine at local pharmacy or Health Dept. Verbalized acceptance and understanding.  Screening Tests Health Maintenance  Topic Date Due   Zoster Vaccines- Shingrix (2 of 2) 07/03/2018   COVID-19 Vaccine (5 - Booster for Moderna series) 04/25/2021   TETANUS/TDAP  10/31/2027   Pneumonia Vaccine 74+ Years old  Completed   INFLUENZA VACCINE  Completed   Hepatitis C Screening  Completed   HPV VACCINES  Aged Out   COLONOSCOPY (Pts 45-52yrs Insurance coverage will need to be confirmed)  Discontinued    Health Maintenance  Health Maintenance Due  Topic Date Due   Zoster Vaccines- Shingrix (2 of 2) 07/03/2018   COVID-19 Vaccine (5 - Booster for Moderna series) 04/25/2021    Colorectal cancer screening: No longer required.   Lung Cancer Screening: (Low Dose CT Chest recommended if Age 38-80 years, 30 pack-year currently smoking OR have quit w/in 15years.) does not qualify.   Additional Screening:  Hepatitis C Screening: does qualify; Completed 12/11/2017  Vision Screening: Recommended annual  ophthalmology exams for early detection of glaucoma and other disorders of the eye. Is the patient up to date with their annual eye exam?  No  Who is the provider or what is the name of the office in which the patient attends annual eye exams? Dr. Nile Riggs If pt is not established with a provider, would they like to be referred to a provider to establish care? No .   Dental Screening: Recommended annual dental exams for proper oral hygiene  Community Resource Referral / Chronic Care Management: CRR required this visit?  No   CCM required this visit?  No      Plan:     I have personally reviewed and noted the following in the patients chart:   Medical and social history Use of alcohol, tobacco or illicit drugs  Current medications and supplements including opioid prescriptions. Patient is not currently taking opioid prescriptions. Functional ability and status Nutritional status Physical activity Advanced directives List of other physicians Hospitalizations, surgeries, and ER visits in previous 12 months Vitals Screenings to include cognitive, depression, and falls Referrals and appointments  In addition, I have reviewed and discussed with patient certain preventive protocols, quality metrics, and best practice recommendations. A written personalized care plan for preventive services as well as general preventive health recommendations were provided to patient.     Darral Dash, LPN   62/13/0865   Nurse Notes: Pt up to date on age appropriate vaccines and health maintenance, except for eye exam. Encouraged pt to call Dr. Ashley Royalty office to schedule. Pt verbalized understanding. 6CIT score of 2.  PHONE VISIT. PT AT HOME. NURSE AT [facility]

## 2021-08-08 ENCOUNTER — Telehealth: Payer: Medicare Other | Admitting: *Deleted

## 2021-08-11 ENCOUNTER — Telehealth: Payer: Self-pay | Admitting: *Deleted

## 2021-08-11 NOTE — Telephone Encounter (Signed)
°  Care Management   Follow Up Note   08/08/2021 Name: Taylor Cross MRN: [id number] DOB: [birth year]   Referred by: Claretta Fraise, MD Reason for referral : Chronic Care Management (Unsuccessful outreach)   An unsuccessful telephone outreach was attempted today. The patient was referred to the case management team for assistance with care management and care coordination.   Follow Up Plan: A HIPPA compliant phone message was left for the patient providing contact information and requesting a return call.  Forwarding to [place] for outreach and rescheduling.   Chong Sicilian, BSN, RN-BC Embedded Chronic Care Manager [facility] Direct Dial: [phone]

## 2021-08-18 ENCOUNTER — Telehealth: Payer: Self-pay

## 2021-08-18 NOTE — Chronic Care Management (AMB) (Signed)
°  Care Management   Note  08/18/2021 Name: Taylor Cross MRN: [id number] DOB: May 21, 1946  Taylor Cross is a 76 y.o. year old male who is a primary care patient of [name], [name], MD and is actively engaged with the care management team. I reached out to [name] by phone today to assist with re-scheduling a follow up visit with the RN Case Manager  Follow up plan: Telephone appointment with care management team member scheduled for:08/30/2021  Penne Lash, RMA Care Guide, Embedded Care Coordination [facility]  [place], [place] [address] Direct Dial: [phone] [email or web address]: [email or web address]

## 2021-08-18 NOTE — Chronic Care Management (AMB) (Signed)
°  Care Management   Note  08/18/2021 Name: Taylor Cross MRN: [id number] DOB: 1945/12/02  Taylor Cross is a 76 y.o. year old male who is a primary care patient of [name], [name], MD and is actively engaged with the care management team. I reached out to [name] by phone today to assist with re-scheduling a follow up visit with the RN Case Manager  Follow up plan: Unsuccessful telephone outreach attempt made. A HIPAA compliant phone message was left for the patient providing contact information and requesting a return call.  The care management team will reach out to the patient again over the next 7 days.  If patient returns call to provider office, please advise to call Embedded Care Management Care Guide Penne Lash  at [phone]  Penne Lash, RMA Care Guide, Embedded Care Coordination [facility]  [place], [place] [address] Direct Dial: [phone] [email or web address]

## 2021-08-30 ENCOUNTER — Encounter: Payer: Self-pay | Admitting: *Deleted

## 2021-08-30 ENCOUNTER — Ambulatory Visit (INDEPENDENT_AMBULATORY_CARE_PROVIDER_SITE_OTHER): Payer: Medicare Other | Admitting: *Deleted

## 2021-08-30 DIAGNOSIS — I5042 Chronic combined systolic (congestive) and diastolic (congestive) heart failure: Secondary | ICD-10-CM

## 2021-08-30 DIAGNOSIS — I10 Essential (primary) hypertension: Secondary | ICD-10-CM

## 2021-08-30 DIAGNOSIS — J438 Other emphysema: Secondary | ICD-10-CM

## 2021-08-30 NOTE — Patient Instructions (Signed)
Visit Information  Patient Goals/Self-Care Activities: Take medications as prescribed   Attend all scheduled provider appointments Perform all self care activities independently  Perform IADL's (shopping, preparing meals, housekeeping, managing finances) independently Call provider office for new concerns or questions  check blood pressure 3 times per week write blood pressure results in a log or diary take blood pressure log to all doctor appointments call doctor for signs and symptoms of high blood pressure begin an exercise program report new symptoms to your doctor eat more whole grains, fruits and vegetables, lean meats and healthy fats Increase activity level with a goal of 150 minutes a week Talk with PCP about whether a pneumovax is appropriate for you Call RN Care Manager as needed [phone] Schedule appointment with cardiologist for 10/2021  Patient verbalizes understanding of instructions and care plan provided today and agrees to view in MyChart. Active MyChart status confirmed with patient.    Plan:Telephone follow up appointment with care management team member scheduled for:  10/30/2021 with RNCM The patient has been provided with contact information for the care management team and has been advised to call with any health related questions or concerns.   Demetrios Loll, BSN, RN-BC Embedded Chronic Care Manager [facility] / [facility] Care Management Direct Dial: [phone]

## 2021-08-30 NOTE — Chronic Care Management (AMB) (Signed)
Chronic Care Management   CCM RN Visit Note  08/30/2021 Name: Taylor Cross MRN: [id number] DOB: 03/04/1946  Subjective: Taylor Cross is a 76 y.o. year old male who is a primary care patient of [name], Cletus Gash, MD. The care management team was consulted for assistance with disease management and care coordination needs.    Engaged with patient by telephone for follow up visit in response to provider referral for case management and/or care coordination services.   Consent to Services:  The patient was given information about Chronic Care Management services, agreed to services, and gave verbal consent prior to initiation of services.  Please see initial visit note for detailed documentation.   Patient agreed to services and verbal consent obtained.   Assessment: Review of patient past medical history, allergies, medications, health status, including review of consultants reports, laboratory and other test data, was performed as part of comprehensive evaluation and provision of chronic care management services.   SDOH (Social Determinants of Health) assessments and interventions performed:    CCM Care Plan  Allergies  Allergen Reactions   Neosporin [Neomycin-Bacitracin Zn-Polymyx] Rash   Voltaren [Diclofenac Sodium] Other (See Comments)    GI upset    Outpatient Encounter Medications as of 08/30/2021  Medication Sig   acetaminophen (TYLENOL) 325 MG tablet Take 2 tablets (650 mg total) by mouth every 6 (six) hours as needed for mild pain, fever or headache (or Fever >/= 101).   allopurinol (ZYLOPRIM) 100 MG tablet Take 1 tablet (100 mg total) by mouth daily.   aspirin 81 MG chewable tablet Chew 1 tablet (81 mg total) by mouth daily with breakfast.   carvedilol (COREG) 12.5 MG tablet Take 1 tablet (12.5 mg total) by mouth 2 (two) times daily.   finasteride (PROSCAR) 5 MG tablet Take 1 tablet (5 mg total) by mouth daily.   fluticasone (FLONASE) 50 MCG/ACT nasal spray Place 2 sprays into  both nostrils daily. (Patient taking differently: Place 2 sprays into both nostrils at bedtime.)   furosemide (LASIX) 40 MG tablet Take 20 mg by mouth daily.   gabapentin (NEURONTIN) 300 MG capsule TAKE (1) CAPSULE TWICE DAILY.   hydroquinone 4 % cream APPLY TO AFFECTED AREAS TWICE A DAY   loratadine (CLARITIN) 10 MG tablet TAKE ONE TABLET AT BEDTIME (Patient taking differently: Take 10 mg by mouth at bedtime.)   mupirocin ointment (BACTROBAN) 2 % Apply 2 (two) times daily. To affected areas   pantoprazole (PROTONIX) 40 MG tablet Take 1 tablet (40 mg total) by mouth daily. TAKE 1 TABLET ONCE DAILY FOR REFLUX (Patient taking differently: Take 40 mg by mouth daily.)   pravastatin (PRAVACHOL) 40 MG tablet Take 1 tablet (40 mg total) by mouth daily.   sertraline (ZOLOFT) 50 MG tablet Take 1 tablet (50 mg total) by mouth daily.   tamsulosin (FLOMAX) 0.4 MG CAPS capsule TAKE (2) CAPSULES DAILY.   No facility-administered encounter medications on file as of 08/30/2021.    Patient Active Problem List   Diagnosis Date Noted   Benign prostatic hyperplasia with nocturia 10/20/2019   Spondylosis of lumbar region without myelopathy or radiculopathy 11/10/2017   Lumbosacral spondylosis without myelopathy 11/10/2017   Gastroesophageal reflux disease with esophagitis 06/11/2017   Balanitis 10/17/2015   Hernia of abdominal cavity 08/16/2015   Essential hypertension 08/04/2015   Inguinal hernia 08/04/2015   Snoring 02/04/2014   Emphysema lung ([place]) [id number]   Systolic dysfunction [id number]   Diastolic dysfunction A999333   PVC's (premature ventricular contractions) 10/20/2013  Mixed hyperlipidemia 10/20/2013   Benign prostatic hyperplasia 05/13/2013   Lower urinary tract symptoms 05/13/2013    Conditions to be addressed/monitored:CHF, HTN, and COPD  Care Plan : [place]  Updates made by Ilean China, RN since 08/30/2021 12:00 AM     Problem: Chronic Disease Management Needs    Priority: High  Onset Date: 05/30/2021     Long-Range Goal: Work with RN Care Manager Regarding Care Management and [place] with HTN, CHF, and COPD   Start Date: 05/30/2021  Expected End Date: 05/30/2022  This Visit's Progress: On track  Recent Progress: On track  Priority: High  Note:   Current Barriers:  Chronic Disease Management support and education needs related to CHF, HTN, and Pulmonary Disease  RNCM Clinical Goal(s):  Patient will continue to work with RN Care Manager and/or Social Worker to address care management and care coordination needs related to CHF, HTN, and Pulmonary Disease as evidenced by adherence to CM Team Scheduled appointments     through collaboration with RN Care manager, provider, and care team.   Interventions: 1:1 collaboration with primary care provider regarding development and update of comprehensive plan of care as evidenced by provider attestation and co-signature Inter-disciplinary care team collaboration (see longitudinal plan of care) Evaluation of current treatment plan related to  self management and patient's adherence to plan as established by provider   Heart Failure Interventions:  (Status: Goal on Track (progressing): YES.)  Long Term Goal  Reminded of importance of daily weight and advised patient to weigh and record each morning after emptying bladder Discussed the importance of keeping all appointments with provider Provided patient with education about the role of exercise in the management of heart failure Assessed social determinant of health barriers Reviewed and discussed medications and importance of compliance Encouraged patient to reach out to cardiologist or PCP with any new symptoms Encouraged increased activity level as tolerated with a goal of 150 minutes a week Discussed f/u with cardiologist is due in April 2023 and that he is on their recall list for March to schedule this   COPD: (Status: Goal on  Track (progressing): YES.) Long Term Goal  Reviewed medications with patient, including use of prescribed maintenance and rescue inhalers, and provided instruction on medication management and the importance of adherence Advised patient to track and manage COPD triggers Advised patient to self assesses COPD action plan zone and make appointment with provider if in the yellow zone for 48 hours without improvement Advised patient to engage in light exercise as tolerated 3-5 days a week to aid in the the management of COPD Provided education about and advised patient to utilize infection prevention strategies to reduce risk of respiratory infection Assessed social determinant of health barriers Previously reviewed health maintenance recommendations and discussed Covid, pneumonia, and flu vaccines. Patient has had yearly flu vaccine. He has had both prevnar and pneumovax with Prevnar being the most recent in 2017. Advised to discuss with PCP if a pneumovax is needed after 5 years. Discussed persistent cough x 1 year and potential causes. He has talked with his provider about the cough. Advised to follow-up with PCP if he has any new or worsening symptoms   Hypertension: (Status: Goal on Track (progressing): YES.) Last practice recorded BP readings:  BP Readings from Last 3 Encounters:  05/11/21 128/74  04/19/21 107/64  04/13/21 108/68   Evaluation of current treatment plan related to hypertension self management and patient's adherence to plan as established  by provider;   Reviewed medications with patient and discussed importance of compliance;  Counseled on the importance of exercise goals with target of 150 minutes per week Advised patient, providing education and rationale, to monitor blood pressure daily and record, calling PCP for findings outside established parameters;  Discussed complications of poorly controlled blood pressure such as heart disease, stroke, circulatory complications,  vision complications, kidney impairment, sexual dysfunction;  Assessed social determinant of health barriers;    Patient Goals/Self-Care Activities: Take medications as prescribed   Attend all scheduled provider appointments Perform all self care activities independently  Perform IADL's (shopping, preparing meals, housekeeping, managing finances) independently Call provider office for new concerns or questions  check blood pressure 3 times per week write blood pressure results in a log or diary take blood pressure log to all doctor appointments call doctor for signs and symptoms of high blood pressure begin an exercise program report new symptoms to your doctor eat more whole grains, fruits and vegetables, lean meats and healthy fats Increase activity level with a goal of 150 minutes a week Talk with PCP about whether a pneumovax is appropriate for you Call RN Care Manager as needed [phone] Schedule appointment with cardiologist for 10/2021  Plan:Telephone follow up appointment with care management team member scheduled for:  10/30/2021 with RNCM The patient has been provided with contact information for the care management team and has been advised to call with any health related questions or concerns.   Chong Sicilian, BSN, RN-BC Embedded Chronic Care Manager [facility] [place] [facility] Direct Dial: [phone]

## 2021-09-26 DIAGNOSIS — I5042 Chronic combined systolic (congestive) and diastolic (congestive) heart failure: Secondary | ICD-10-CM | POA: Diagnosis not present

## 2021-09-26 DIAGNOSIS — J438 Other emphysema: Secondary | ICD-10-CM

## 2021-09-26 DIAGNOSIS — I110 Hypertensive heart disease with heart failure: Secondary | ICD-10-CM

## 2021-10-18 ENCOUNTER — Encounter: Payer: Self-pay | Admitting: Family Medicine

## 2021-10-18 ENCOUNTER — Ambulatory Visit (INDEPENDENT_AMBULATORY_CARE_PROVIDER_SITE_OTHER): Payer: Medicare Other | Admitting: Family Medicine

## 2021-10-18 VITALS — BP 114/66 | HR 68 | Temp 97.0°F | Ht 68.0 in | Wt 197.2 lb

## 2021-10-18 DIAGNOSIS — E782 Mixed hyperlipidemia: Secondary | ICD-10-CM

## 2021-10-18 DIAGNOSIS — K2100 Gastro-esophageal reflux disease with esophagitis, without bleeding: Secondary | ICD-10-CM

## 2021-10-18 DIAGNOSIS — R053 Chronic cough: Secondary | ICD-10-CM

## 2021-10-18 DIAGNOSIS — I5189 Other ill-defined heart diseases: Secondary | ICD-10-CM

## 2021-10-18 DIAGNOSIS — N183 Chronic kidney disease, stage 3 unspecified: Secondary | ICD-10-CM | POA: Diagnosis not present

## 2021-10-18 DIAGNOSIS — I10 Essential (primary) hypertension: Secondary | ICD-10-CM

## 2021-10-18 MED ORDER — PANTOPRAZOLE SODIUM 40 MG PO TBEC: 40.0000 mg | DELAYED_RELEASE_TABLET | Freq: Every day | ORAL | 3 refills | Status: DC

## 2021-10-18 MED ORDER — FEXOFENADINE HCL 180 MG PO TABS: 180.0000 mg | ORAL_TABLET | Freq: Every day | ORAL | 11 refills | Status: DC

## 2021-10-18 MED ORDER — FLUTICASONE PROPIONATE 50 MCG/ACT NA SUSP: 2.0000 | Freq: Every day | NASAL | 11 refills | Status: DC

## 2021-10-18 MED ORDER — CARVEDILOL 12.5 MG PO TABS: 12.5000 mg | ORAL_TABLET | Freq: Two times a day (BID) | ORAL | 3 refills | Status: DC

## 2021-10-18 MED ORDER — ALLOPURINOL 100 MG PO TABS: 100.0000 mg | ORAL_TABLET | Freq: Every day | ORAL | 3 refills | Status: DC

## 2021-10-18 NOTE — Progress Notes (Signed)
Subjective:  Patient ID: Taylor Cross, male    DOB: [birth year]  Age: 76 y.o. MRN: [id number]  CC: Medical Management of Chronic Issues   HPI ROBT GUNNELL presents for  follow-up of hypertension. Patient has no history of headache chest pain or shortness of breath or recent cough. Patient also denies symptoms of TIA such as focal numbness or weakness. Patient denies side effects from medication. States taking it regularly.  Patient in for follow-up of GERD. Currently asymptomatic taking  PPI daily. There is no chest pain or heartburn. No hematemesis and no melena. No dysphagia or choking. Onset is remote. Progression is stable. Complicating factors, none.   in for follow-up of elevated cholesterol. Doing well without complaints on current medication. Denies side effects of statin including myalgia and arthralgia and nausea. Currently no chest pain, shortness of breath or other cardiovascular related symptoms noted.    History Mariana has a past medical history of Acute respiratory failure with hypoxia and hypercapnia (HCC) (07/19/2020), Acute systolic CHF (congestive heart failure) (HCC) (07/19/2020), Combined systolic and diastolic heart failure (HCC), and Hyperlipidemia.   He has a past surgical history that includes Tonsillectomy and Hernia repair.   His family history includes COPD in his father; Diabetes in his mother; Heart attack (age of onset: 12) in his brother; Heart disease in his mother; Heart failure in his mother.He reports that he has never smoked. He has never used smokeless tobacco. He reports that he does not drink alcohol and does not use drugs.  Current Outpatient Medications on File Prior to Visit  Medication Sig Dispense Refill   acetaminophen (TYLENOL) 325 MG tablet Take 2 tablets (650 mg total) by mouth every 6 (six) hours as needed for mild pain, fever or headache (or Fever >/= 101). 12 tablet 0   aspirin 81 MG chewable tablet Chew 1 tablet (81 mg total) by mouth  daily with breakfast. 120 tablet 3   finasteride (PROSCAR) 5 MG tablet Take 1 tablet (5 mg total) by mouth daily. 90 tablet 3   furosemide (LASIX) 40 MG tablet Take 20 mg by mouth daily.     gabapentin (NEURONTIN) 300 MG capsule TAKE (1) CAPSULE TWICE DAILY. 180 capsule 1   hydroquinone 4 % cream APPLY TO AFFECTED AREAS TWICE A DAY 28.35 g 0   mupirocin ointment (BACTROBAN) 2 % Apply 2 (two) times daily. To affected areas 22 g 2   pravastatin (PRAVACHOL) 40 MG tablet Take 1 tablet (40 mg total) by mouth daily. 90 tablet 2   sertraline (ZOLOFT) 50 MG tablet Take 1 tablet (50 mg total) by mouth daily. 90 tablet 2   tamsulosin (FLOMAX) 0.4 MG CAPS capsule TAKE (2) CAPSULES DAILY. 180 capsule 3   No current facility-administered medications on file prior to visit.    ROS Review of Systems  Constitutional:  Negative for fever.  Respiratory:  Positive for cough (for years. Worse for a year). Negative for shortness of breath.   Cardiovascular:  Negative for chest pain.  Musculoskeletal:  Negative for arthralgias.  Skin:  Negative for rash.   Objective:  BP 114/66   Pulse 68   Temp (!) 97 F (36.1 C)   Ht 5\' 8"  (1.727 m)   Wt 197 lb 3.2 oz (89.4 kg)   SpO2 91%   BMI 29.98 kg/m   BP Readings from Last 3 Encounters:  10/18/21 114/66  05/11/21 128/74  04/19/21 107/64    Wt Readings from Last 3 Encounters:  10/18/21  197 lb 3.2 oz (89.4 kg)  07/26/21 195 lb (88.5 kg)  05/11/21 198 lb 12.8 oz (90.2 kg)     Physical Exam Constitutional:      General: He is not in acute distress.    Appearance: He is well-developed.  HENT:     Head: Normocephalic and atraumatic.     Right Ear: External ear normal.     Left Ear: External ear normal.     Nose: Nose normal.  Eyes:     Conjunctiva/sclera: Conjunctivae normal.     Pupils: Pupils are equal, round, and reactive to light.  Cardiovascular:     Rate and Rhythm: Normal rate and regular rhythm.     Heart sounds: Normal heart sounds. No  murmur heard. Pulmonary:     Effort: Pulmonary effort is normal. No respiratory distress.     Breath sounds: Normal breath sounds. No wheezing or rales.  Abdominal:     Palpations: Abdomen is soft.     Tenderness: There is no abdominal tenderness.  Musculoskeletal:        General: Normal range of motion.     Cervical back: Normal range of motion and neck supple.  Skin:    General: Skin is warm and dry.  Neurological:     Mental Status: He is alert and oriented to person, place, and time.     Deep Tendon Reflexes: Reflexes are normal and symmetric.  Psychiatric:        Behavior: Behavior normal.        Thought Content: Thought content normal.        Judgment: Judgment normal.      Assessment & Plan:   Hallie was seen today for medical management of chronic issues.  Diagnoses and all orders for this visit:  Essential hypertension -     CBC with Differential/Platelet -     CMP14+EGFR -     carvedilol (COREG) 12.5 MG tablet; Take 1 tablet (12.5 mg total) by mouth 2 (two) times daily.  Mixed hyperlipidemia -     Lipid panel  Chronic renal impairment, stage 3 (moderate), unspecified whether stage 3a or 3b CKD (HCC) -     allopurinol (ZYLOPRIM) 100 MG tablet; Take 1 tablet (100 mg total) by mouth daily.  Gastroesophageal reflux disease with esophagitis without hemorrhage -     pantoprazole (PROTONIX) 40 MG tablet; Take 1 tablet (40 mg total) by mouth daily. TAKE 1 TABLET ONCE DAILY FOR REFLUX  Diastolic dysfunction -     carvedilol (COREG) 12.5 MG tablet; Take 1 tablet (12.5 mg total) by mouth 2 (two) times daily.  Chronic cough -     Allergen Profile, Mold  Other orders -     fluticasone (FLONASE) 50 MCG/ACT nasal spray; Place 2 sprays into both nostrils daily. -     fexofenadine (ALLEGRA) 180 MG tablet; Take 1 tablet (180 mg total) by mouth daily. For allergy symptoms   Allergies as of 10/18/2021       Reactions   Neosporin [neomycin-bacitracin Zn-polymyx] Rash    Voltaren [diclofenac Sodium] Other (See Comments)   GI upset        Medication List        Accurate as of October 18, 2021 11:06 AM. If you have any questions, ask your nurse or doctor.          STOP taking these medications    loratadine 10 MG tablet Commonly known as: CLARITIN Stopped by: [name], MD  TAKE these medications    acetaminophen 325 MG tablet Commonly known as: TYLENOL Take 2 tablets (650 mg total) by mouth every 6 (six) hours as needed for mild pain, fever or headache (or Fever >/= 101).   allopurinol 100 MG tablet Commonly known as: ZYLOPRIM Take 1 tablet (100 mg total) by mouth daily.   aspirin 81 MG chewable tablet Chew 1 tablet (81 mg total) by mouth daily with breakfast.   carvedilol 12.5 MG tablet Commonly known as: COREG Take 1 tablet (12.5 mg total) by mouth 2 (two) times daily.   fexofenadine 180 MG tablet Commonly known as: ALLEGRA Take 1 tablet (180 mg total) by mouth daily. For allergy symptoms Started by: [name], MD   finasteride 5 MG tablet Commonly known as: PROSCAR Take 1 tablet (5 mg total) by mouth daily.   fluticasone 50 MCG/ACT nasal spray Commonly known as: FLONASE Place 2 sprays into both nostrils daily. What changed: when to take this   furosemide 40 MG tablet Commonly known as: LASIX Take 20 mg by mouth daily.   gabapentin 300 MG capsule Commonly known as: NEURONTIN TAKE (1) CAPSULE TWICE DAILY.   hydroquinone 4 % cream APPLY TO AFFECTED AREAS TWICE A DAY   mupirocin ointment 2 % Commonly known as: BACTROBAN Apply 2 (two) times daily. To affected areas   pantoprazole 40 MG tablet Commonly known as: PROTONIX Take 1 tablet (40 mg total) by mouth daily. TAKE 1 TABLET ONCE DAILY FOR REFLUX What changed: additional instructions   pravastatin 40 MG tablet Commonly known as: PRAVACHOL Take 1 tablet (40 mg total) by mouth daily.   sertraline 50 MG tablet Commonly known as: ZOLOFT Take 1  tablet (50 mg total) by mouth daily.   tamsulosin 0.4 MG Caps capsule Commonly known as: FLOMAX TAKE (2) CAPSULES DAILY.        Meds ordered this encounter  Medications   allopurinol (ZYLOPRIM) 100 MG tablet    Sig: Take 1 tablet (100 mg total) by mouth daily.    Dispense:  90 tablet    Refill:  3   fluticasone (FLONASE) 50 MCG/ACT nasal spray    Sig: Place 2 sprays into both nostrils daily.    Dispense:  16 g    Refill:  11   pantoprazole (PROTONIX) 40 MG tablet    Sig: Take 1 tablet (40 mg total) by mouth daily. TAKE 1 TABLET ONCE DAILY FOR REFLUX    Dispense:  90 tablet    Refill:  3   carvedilol (COREG) 12.5 MG tablet    Sig: Take 1 tablet (12.5 mg total) by mouth 2 (two) times daily.    Dispense:  180 tablet    Refill:  3    09/22/2020 dose decrease   fexofenadine (ALLEGRA) 180 MG tablet    Sig: Take 1 tablet (180 mg total) by mouth daily. For allergy symptoms    Dispense:  30 tablet    Refill:  11    Cough will be checked for allergy to mold since he has a damp basement.  Follow-up: Return in about 6 months (around 04/20/2022).  [name], M.D.

## 2021-10-20 LAB — CMP14+EGFR
ALT: 9 IU/L (ref 0–44)
AST: 12 IU/L (ref 0–40)
Albumin/Globulin Ratio: 1.6 (ref 1.2–2.2)
Albumin: 4.1 g/dL (ref 3.7–4.7)
Alkaline Phosphatase: 83 IU/L (ref 44–121)
BUN/Creatinine Ratio: 16 (ref 10–24)
BUN: 27 mg/dL (ref 8–27)
Bilirubin Total: 0.4 mg/dL (ref 0.0–1.2)
CO2: 24 mmol/L (ref 20–29)
Calcium: 9.1 mg/dL (ref 8.6–10.2)
Chloride: 102 mmol/L (ref 96–106)
Creatinine, Ser: 1.71 mg/dL — ABNORMAL HIGH (ref 0.76–1.27)
Globulin, Total: 2.6 g/dL (ref 1.5–4.5)
Glucose: 109 mg/dL — ABNORMAL HIGH (ref 70–99)
Potassium: 5.2 mmol/L (ref 3.5–5.2)
Sodium: 142 mmol/L (ref 134–144)
Total Protein: 6.7 g/dL (ref 6.0–8.5)
eGFR: 41 mL/min/{1.73_m2} — ABNORMAL LOW (ref >59)

## 2021-10-20 LAB — CBC WITH DIFFERENTIAL/PLATELET
Basophils Absolute: 0 10*3/uL (ref 0.0–0.2)
Basos: 1 %
EOS (ABSOLUTE): 0.3 10*3/uL (ref 0.0–0.4)
Eos: 5 %
Hematocrit: 32.4 % — ABNORMAL LOW (ref 37.5–51.0)
Hemoglobin: 10.4 g/dL — ABNORMAL LOW (ref 13.0–17.7)
Immature Grans (Abs): 0 10*3/uL (ref 0.0–0.1)
Immature Granulocytes: 0 %
Lymphocytes Absolute: 1.9 10*3/uL (ref 0.7–3.1)
Lymphs: 30 %
MCH: 26.7 pg (ref 26.6–33.0)
MCHC: 32.1 g/dL (ref 31.5–35.7)
MCV: 83 fL (ref 79–97)
Monocytes Absolute: 0.8 10*3/uL (ref 0.1–0.9)
Monocytes: 12 %
Neutrophils Absolute: 3.2 10*3/uL (ref 1.4–7.0)
Neutrophils: 52 %
Platelets: 202 10*3/uL (ref 150–450)
RBC: 3.9 x10E6/uL — ABNORMAL LOW (ref 4.14–5.80)
RDW: 15.8 % — ABNORMAL HIGH (ref 11.6–15.4)
WBC: 6.2 10*3/uL (ref 3.4–10.8)

## 2021-10-20 LAB — ALLERGEN PROFILE, MOLD
Alternaria Alternata IgE: <0.1 kU/L
Aspergillus Fumigatus IgE: <0.1 kU/L
Aureobasidi Pullulans IgE: <0.1 kU/L
Candida Albicans IgE: 0.64 kU/L — AB
Cladosporium Herbarum IgE: <0.1 kU/L
M009-IgE Fusarium proliferatum: <0.1 kU/L
M014-IgE Epicoccum purpur: <0.1 kU/L
Mucor Racemosus IgE: <0.1 kU/L
Penicillium Chrysogen IgE: <0.1 kU/L
Phoma Betae IgE: <0.1 kU/L
Setomelanomma Rostrat: <0.1 kU/L
Stemphylium Herbarum IgE: <0.1 kU/L

## 2021-10-20 LAB — LIPID PANEL
Chol/HDL Ratio: 3.7 ratio (ref 0.0–5.0)
Cholesterol, Total: 185 mg/dL (ref 100–199)
HDL: 50 mg/dL (ref >39)
LDL Chol Calc (NIH): 109 mg/dL — ABNORMAL HIGH (ref 0–99)
Triglycerides: 145 mg/dL (ref 0–149)
VLDL Cholesterol Cal: 26 mg/dL (ref 5–40)

## 2021-10-21 LAB — IRON AND TIBC
Iron Saturation: 10 % — ABNORMAL LOW (ref 15–55)
Iron: 45 ug/dL (ref 38–169)
Total Iron Binding Capacity: 448 ug/dL (ref 250–450)
UIBC: 403 ug/dL — ABNORMAL HIGH (ref 111–343)

## 2021-10-21 LAB — RETICULOCYTES: Retic Ct Pct: 1.9 % (ref 0.6–2.6)

## 2021-10-21 LAB — SPECIMEN STATUS REPORT

## 2021-10-21 LAB — FERRITIN: Ferritin: 18 ng/mL — ABNORMAL LOW (ref 30–400)

## 2021-10-30 ENCOUNTER — Telehealth: Payer: Self-pay | Admitting: *Deleted

## 2021-10-30 ENCOUNTER — Telehealth: Payer: Medicare Other | Admitting: *Deleted

## 2021-11-09 ENCOUNTER — Telehealth: Payer: Medicare Other | Admitting: *Deleted

## 2021-11-22 ENCOUNTER — Telehealth: Payer: Self-pay | Admitting: Family Medicine

## 2021-11-22 ENCOUNTER — Other Ambulatory Visit: Payer: Self-pay | Admitting: Family Medicine

## 2021-11-22 NOTE — Telephone Encounter (Signed)
?  Prescription Request ? ?11/22/2021 ? ?Is this a "Controlled Substance" medicine? no ? ?Have you seen your PCP in the last 2 weeks? No pt was last week on 10/18/2021 next appt with PCP is on 04/19/2022 ? ?If YES, route message to pool  -  If NO, patient needs to be scheduled for appointment. ? ?What is the name of the medication or equipment? loratadine (CLARITIN) 10 MG tablet [Pharmacy Med Name: loratadine 10 mg tablet, pt needs for his allergies and is cheaper with a prescription than OTC ? ?Have you contacted your pharmacy to request a refill? yes  ? ?Which pharmacy would you like this sent to? [facility]  ? ? ?Patient notified that their request is being sent to the clinical staff for review and that they should receive a response within 2 business days.  ? ? ?

## 2021-11-23 ENCOUNTER — Other Ambulatory Visit: Payer: Self-pay | Admitting: Family Medicine

## 2021-11-23 MED ORDER — LORATADINE 10 MG PO TABS: 10.0000 mg | ORAL_TABLET | Freq: Every day | ORAL | 3 refills | Status: DC

## 2021-11-23 NOTE — Telephone Encounter (Signed)
Please let the patient know that I sent their prescription to their pharmacy. Thanks, WS 

## 2021-12-18 ENCOUNTER — Other Ambulatory Visit: Payer: Self-pay | Admitting: Family Medicine

## 2022-01-02 ENCOUNTER — Ambulatory Visit (INDEPENDENT_AMBULATORY_CARE_PROVIDER_SITE_OTHER): Payer: Medicare Other | Admitting: Cardiology

## 2022-01-02 ENCOUNTER — Encounter: Payer: Self-pay | Admitting: Cardiology

## 2022-01-02 VITALS — BP 118/70 | HR 50 | Ht 68.0 in | Wt 201.4 lb

## 2022-01-02 DIAGNOSIS — N1832 Chronic kidney disease, stage 3b: Secondary | ICD-10-CM | POA: Diagnosis not present

## 2022-01-02 DIAGNOSIS — Z79899 Other long term (current) drug therapy: Secondary | ICD-10-CM | POA: Diagnosis not present

## 2022-01-02 DIAGNOSIS — I5022 Chronic systolic (congestive) heart failure: Secondary | ICD-10-CM | POA: Diagnosis not present

## 2022-01-02 NOTE — Patient Instructions (Addendum)
Medication Instructions:  Your physician recommends that you continue on your current medications as directed. Please refer to the Current Medication list given to you today.  Labwork: BMET & MG in 2 weeks around 01/16/2022 [place] Non-fasting  Testing/Procedures: none  Follow-Up: Your physician recommends that you schedule a follow-up appointment in: 6 weeks  Any Other Special Instructions Will Be Listed Below (If Applicable).  If you need a refill on your cardiac medications before your next appointment, please call your pharmacy.

## 2022-01-02 NOTE — Progress Notes (Signed)
Clinical Summary Taylor Cross is a 76 y.o.male seen today for follow up of the following medical problems.   1. Chronic Systolic/Diastolic heart failure   - 10/2013 echo which showed LVEF 40-45%, mid-inferolateral wall hypokinesis, and grade II diastolic dysfunction.   - repeat echo 08/2014 LVEF 35-40%, basal to mid inferolateral and inferior walls, grade I diastolic dysfunction - completed lexiscan which showed moderate sized moderate to severe intensity inferolateral wall defect and small apical to mid inferior wall defect. Both areas were fixed and consistent with scar.      - lisinopril  stopped by other provider due to decreasing renal function.    12/2018 echo: LVEF 50-55%     - admitted 06/2020 with CHF exacerbation and pneumonia - echo from 07/20/2020 shows that EF is down to 35 to 40% from 50 to 55% back in June 2020, - home SBP's 100s.  - some orthostatic symptoms at times - breathing has done well since discharge     08/2020 echo LVEF 40-45%, grade I dd, normal RV   Low bp's and fatigue on coresg 14m bid, lowered to 12.528mbid 04/09/21 ER visit with SOB. Elevated BNP and CXR with volume overload Started on lasix 4067maily. He had not been on lasix at the time - symptoms much improved with diuretic    - elevation in Cr, off farxiga and lasix lowered. Cr was up to 1.9, more recently varying 1.5-1.7 - no recent edema, controlled with lasix - no SOB/DOE      2. CKD III - Cr has improved off ACE-I.        3. HL - 09/2021 TC 185 TG 145 HDL 50 LDL 109     4. HTN - compliant with meds   Past Medical History:  Diagnosis Date   Acute respiratory failure with hypoxia and hypercapnia ([facility]/06[facility]26/9485Acute systolic CHF (congestive heart failure) ([facility]/21/2021   Combined systolic and diastolic heart failure (HCC)    Hyperlipidemia      Allergies  Allergen Reactions   Neosporin [Neomycin-Bacitracin Zn-Polymyx] Rash   Voltaren [Diclofenac Sodium] Other (See  Comments)    GI upset     Current Outpatient Medications  Medication Sig Dispense Refill   acetaminophen (TYLENOL) 325 MG tablet Take 2 tablets (650 mg total) by mouth every 6 (six) hours as needed for mild pain, fever or headache (or Fever >/= 101). 12 tablet 0   allopurinol (ZYLOPRIM) 100 MG tablet Take 1 tablet (100 mg total) by mouth daily. 90 tablet 3   aspirin 81 MG chewable tablet Chew 1 tablet (81 mg total) by mouth daily with breakfast. 120 tablet 3   carvedilol (COREG) 12.5 MG tablet Take 1 tablet (12.5 mg total) by mouth 2 (two) times daily. 180 tablet 3   finasteride (PROSCAR) 5 MG tablet Take 1 tablet (5 mg total) by mouth daily. 90 tablet 3   fluticasone (FLONASE) 50 MCG/ACT nasal spray Place 2 sprays into both nostrils daily. 16 g 11   furosemide (LASIX) 40 MG tablet Take 20 mg by mouth daily.     gabapentin (NEURONTIN) 300 MG capsule TAKE (1) CAPSULE TWICE DAILY. 180 capsule 0   hydroquinone 4 % cream APPLY TO AFFECTED AREAS TWICE A DAY 28.35 g 0   loratadine (CLARITIN) 10 MG tablet Take 1 tablet (10 mg total) by mouth daily. 90 tablet 3   mupirocin ointment (BACTROBAN) 2 % Apply 2 (two) times daily. To affected areas 22 g  2   pantoprazole (PROTONIX) 40 MG tablet Take 1 tablet (40 mg total) by mouth daily. TAKE 1 TABLET ONCE DAILY FOR REFLUX 90 tablet 3   pravastatin (PRAVACHOL) 40 MG tablet Take 1 tablet (40 mg total) by mouth daily. 90 tablet 2   sertraline (ZOLOFT) 50 MG tablet Take 1 tablet (50 mg total) by mouth daily. 90 tablet 2   tamsulosin (FLOMAX) 0.4 MG CAPS capsule TAKE (2) CAPSULES DAILY. 180 capsule 3   No current facility-administered medications for this visit.     Past Surgical History:  Procedure Laterality Date   HERNIA REPAIR     TONSILLECTOMY       Allergies  Allergen Reactions   Neosporin [Neomycin-Bacitracin Zn-Polymyx] Rash   Voltaren [Diclofenac Sodium] Other (See Comments)    GI upset      Family History  Problem Relation Age of  Onset   Heart disease Mother    Diabetes Mother    Heart failure Mother    COPD Father    Heart attack Brother 42     Social History [name] reports that he has never smoked. He has never used smokeless tobacco. [name] reports no history of alcohol use.   Review of Systems CONSTITUTIONAL: No weight loss, fever, chills, weakness or fatigue.  HEENT: Eyes: No visual loss, blurred vision, double vision or yellow sclerae.No hearing loss, sneezing, congestion, runny nose or sore throat.  SKIN: No rash or itching.  CARDIOVASCULAR: per hpi RESPIRATORY: No shortness of breath, cough or sputum.  GASTROINTESTINAL: No anorexia, nausea, vomiting or diarrhea. No abdominal pain or blood.  GENITOURINARY: No burning on urination, no polyuria NEUROLOGICAL: No headache, dizziness, syncope, paralysis, ataxia, numbness or tingling in the extremities. No change in bowel or bladder control.  MUSCULOSKELETAL: No muscle, back pain, joint pain or stiffness.  LYMPHATICS: No enlarged nodes. No history of splenectomy.  PSYCHIATRIC: No history of depression or anxiety.  ENDOCRINOLOGIC: No reports of sweating, cold or heat intolerance. No polyuria or polydipsia.  Marland Kitchen   Physical Examination Today's Vitals   01/02/22 1334  BP: 118/70  Pulse: (!) 50  SpO2: 96%  Weight: 201 lb 6.4 oz (91.4 kg)  Height: _0  (1.727 m)   Body mass index is 30.62 kg/m.  Gen: resting comfortably, no acute distress HEENT: no scleral icterus, pupils equal round and reactive, no palptable cervical adenopathy,  CV: RRR, no m/r/g, no jvd Resp: Clear to auscultation bilaterally GI: abdomen is soft, non-tender, non-distended, normal bowel sounds, no hepatosplenomegaly MSK: extremities are warm, no edema.  Skin: warm, no rash Neuro:  no focal deficits Psych: appropriate affect   Diagnostic Studies  10/2013 Echo   Left ventricle: The cavity size was mildly dilated. Wall thickness was normal. Systolic function was  mildly to moderately reduced. The estimated ejection fraction is 40%. Features are consistent with a pseudonormal left ventricular filling pattern, with concomitant abnormal relaxation and increased filling pressure (grade 2 diastolic dysfunction). Doppler parameters are consistent with high ventricular filling pressure. - Regional wall motion abnormality: Mild to moderate hypokinesis of the mid inferolateral myocardium. The remaining walls are mildly hypokinetic. - Aortic valve: Trileaflet; mildly thickened, mildly calcified leaflets. There was no stenosis. - Mitral valve: Mildly thickened leaflets . Mild to moderate regurgitation. - Left atrium: The atrium was moderately dilated. - Tricuspid valve: Mild regurgitation. Inadequate TR jet to accurately assess pulmonary pressures. - Pericardium, extracardiac: There was a left pleural effusion.   11/11/13 Lexiscan MPI   Gated imaging reveals  an EDV of 223, ESV of 153, TID ratio 0.99, and LVEF of 31% with global hypokinesis, most prominent in the apex and inferolateral walls.  IMPRESSION: Intermediate risk Lexiscan Cardiolite. No diagnostic ST segment changes were noted to indicate ischemia, frequent PVCs were seen without sustained arrhythmia. Perfusion imaging is most consistent with scar affecting the inferior wall and inferolateral wall as outlined, no large ischemic zones however. LVEF is calculated at 31% with global hypokinesis that is most prominent at the apex and inferior wall. EDV indicates moderate to severe chamber dilatation. Findings are consistent with probable ischemic cardiomyopathy.     08/2014 echo Study Conclusions  - Left ventricle: The cavity size was mildly dilated. Wall   thickness was increased in a pattern of moderate LVH. Systolic   function was moderately reduced. The estimated ejection fraction   was in the range of 35% to 40%. There is hypokinesis to akinesis   of the basal-mid inferolateral and  inferior myocardium. Doppler   parameters are consistent with abnormal left ventricular   relaxation (grade 1 diastolic dysfunction). - Aortic valve: Mildly to moderately calcified annulus. Trileaflet. - Mitral valve: Mildly calcified annulus. There was mild   regurgitation. - Left atrium: The atrium was mildly to moderately dilated. - Right atrium: Central venous pressure (est): 3 mm Hg. - Atrial septum: Possible PFO or secundum ASD with apparent left to   right flow noted by color Doppler. Cannot exclude vigorous venous   return adjacent to septum. Agitated saline study could be   considered for further evaluation. - Tricuspid valve: There was trivial regurgitation. - Pulmonary arteries: PA peak pressure: 17 mm Hg (S). - Pericardium, extracardiac: There was no pericardial effusion.  Recommendations:  Moderate LVH with mild LV chamber dilatation and LVEF approximately 35-40%. Hypokinesis to akinesis of the mid to basal inferolateral/inferior walls noted suggestive of ischemic cardiomyopathy. Grade 1 diastolic dysfunction. Compared to prior study in April 2015, wall motion has improved somewhat and ejection fraction looks to be higher, although graded in similar range last time. Mild to moderate left atrial enlargement. Mild MAC with mild mitral regurgitation. Possible PFO or secundum ASD with apparent left to right flow noted by color Doppler. Cannot exclude vigorous venous return adjacent to septum. Agitated saline study could be considered for further evaluation. Trivial tricuspid regurgitation with PASP normal range.             12/2018 echo 1. The left ventricle has low normal systolic function, with an ejection fraction of 50-55%. The cavity size was normal. There is mildly increased left ventricular wall thickness. Left ventricular diastolic Doppler parameters are consistent with  impaired relaxation. Elevated mean left atrial pressure.  2. The right ventricle has normal  systolic function. The cavity was normal. There is no increase in right ventricular wall thickness.  3. Left atrial size was moderately dilated.  4. No evidence of mitral valve stenosis.  5. The aortic valve is tricuspid. No stenosis of the aortic valve.  6. The aortic root is normal in size and structure.  7. Pulmonary hypertension is indeterminant, inadequate TR jet.     06/2020 echo IMPRESSIONS     1. Left ventricular ejection fraction, by estimation, is 35 to 40%. The  left ventricle has moderately decreased function. The left ventricle  demonstrates regional wall motion abnormalities (see scoring  diagram/findings for description). There is mild  left ventricular hypertrophy. Left ventricular diastolic parameters are  indeterminate.   2. Right ventricular systolic function is normal. The  right ventricular  size is normal. Tricuspid regurgitation signal is inadequate for assessing  PA pressure.   3. Left atrial size was mild to moderately dilated.   4. The mitral valve is grossly normal. Mild mitral valve regurgitation.   5. The aortic valve is tricuspid. There is moderate calcification of the  aortic valve. Aortic valve regurgitation is not visualized. Mild aortic  valve stenosis. Aortic valve mean gradient measures 10.0 mmHg.   6. The inferior vena cava is normal in size with greater than 50%  respiratory variability, suggesting right atrial pressure of 3 mmHg.   7. Left pleural effusion is noted.     08/2020 echo IMPRESSIONS     1. Left ventricular ejection fraction, by estimation, is 40 to 45%. The  left ventricle has mildly decreased function. The left ventricle  demonstrates global hypokinesis. There is mild left ventricular  hypertrophy. Left ventricular diastolic parameters  are consistent with Grade I diastolic dysfunction (impaired relaxation).   2. Right ventricular systolic function is normal. The right ventricular  size is normal.   3. Left atrial size was  severely dilated.   4. The mitral valve is normal in structure. Trivial mitral valve  regurgitation. No evidence of mitral stenosis.   5. The aortic valve is tricuspid. There is mild calcification of the  aortic valve. There is mild thickening of the aortic valve. Aortic valve  regurgitation is not visualized. No aortic stenosis is present.   6. The inferior vena cava is normal in size with greater than 50%  respiratory variability, suggesting right atrial pressure of 3 mmHg.    Assessment and Plan  1. Chronic Systolic/diastolic heart failure -Medical therapy has been limited due to orthostatic dizziness, renal dysfunction - history of CKD, prior AKI on ACEI has not been on ACE/ARB/ARNI - more recently trial of farxiga led to elevation in Cr, we discontinued - cntinue coreg at this time, could consider hydral/nitrates over time.  - no symptoms, continue current meds  2. CKD 3B - repeat bmet in 2 weeks - limit nephrotoxic meds        Arnoldo Lenis, M.D.

## 2022-01-03 NOTE — Telephone Encounter (Signed)
    Care Management   Follow Up Note   10/30/2021  Name: Taylor Cross MRN: [id number] DOB: 1945-09-01   Referred by: Claretta Fraise, MD Reason for referral : Chronic Care Management (Unsuccessful outreach)   An unsuccessful telephone outreach was attempted today. The patient was referred to the case management team for assistance with care management and care coordination.   Follow Up Plan: A HIPPA compliant phone message was left for the patient providing contact information and requesting a return call. Forwarding to [facility] Care Guides for outreach and rescheduling.   Chong Sicilian, BSN, RN-BC Embedded Chronic Care Manager [facility] [place] [facility] Direct Dial: [phone]

## 2022-01-16 ENCOUNTER — Other Ambulatory Visit: Payer: Medicare Other

## 2022-01-17 LAB — MAGNESIUM: Magnesium: 2.6 mg/dL — ABNORMAL HIGH (ref 1.6–2.3)

## 2022-01-17 LAB — BASIC METABOLIC PANEL WITH GFR
BUN/Creatinine Ratio: 12 (ref 10–24)
BUN: 17 mg/dL (ref 8–27)
CO2: 23 mmol/L (ref 20–29)
Calcium: 9.3 mg/dL (ref 8.6–10.2)
Chloride: 95 mmol/L — ABNORMAL LOW (ref 96–106)
Creatinine, Ser: 1.42 mg/dL — ABNORMAL HIGH (ref 0.76–1.27)
Glucose: 110 mg/dL — ABNORMAL HIGH (ref 70–99)
Potassium: 4.4 mmol/L (ref 3.5–5.2)
Sodium: 136 mmol/L (ref 134–144)
eGFR: 51 mL/min/{1.73_m2} — ABNORMAL LOW

## 2022-01-23 ENCOUNTER — Ambulatory Visit (INDEPENDENT_AMBULATORY_CARE_PROVIDER_SITE_OTHER): Payer: Medicare Other | Admitting: *Deleted

## 2022-01-23 DIAGNOSIS — I10 Essential (primary) hypertension: Secondary | ICD-10-CM

## 2022-01-23 DIAGNOSIS — J438 Other emphysema: Secondary | ICD-10-CM

## 2022-01-23 NOTE — Chronic Care Management (AMB) (Signed)
Care Management    RN Visit Note  01/23/2022 Name: Taylor Cross MRN: [id number] DOB: Jan 28, 1946  Subjective: Taylor Cross is a 76 y.o. year old male who is a primary care patient of [name], [name], MD. The care management team was consulted for assistance with disease management and care coordination needs.    Engaged with patient by telephone for  final CCM visit  in response to provider referral for case management and/or care coordination services.   Consent to Services:   Taylor Cross was given information about Care Management services today including:  Care Management services includes personalized support from designated clinical staff supervised by his physician, including individualized plan of care and coordination with other care providers 24/7 contact phone numbers for assistance for urgent and routine care needs. The patient may stop case management services at any time by phone call to the office staff.  Patient agreed to services and consent obtained.   Assessment: Review of patient past medical history, allergies, medications, health status, including review of consultants reports, laboratory and other test data, was performed as part of comprehensive evaluation and provision of chronic care management services.   SDOH (Social Determinants of Health) assessments and interventions performed:    Care Plan  Allergies  Allergen Reactions   Neosporin [Neomycin-Bacitracin Zn-Polymyx] Rash   Voltaren [Diclofenac Sodium] Other (See Comments)    GI upset    Outpatient Encounter Medications as of 01/23/2022  Medication Sig   acetaminophen (TYLENOL) 325 MG tablet Take 2 tablets (650 mg total) by mouth every 6 (six) hours as needed for mild pain, fever or headache (or Fever >/= 101).   allopurinol (ZYLOPRIM) 100 MG tablet Take 1 tablet (100 mg total) by mouth Cross.   aspirin 81 MG chewable tablet Chew 1 tablet (81 mg total) by mouth Cross with breakfast.   carvedilol (COREG)  12.5 MG tablet Take 1 tablet (12.5 mg total) by mouth 2 (two) times Cross.   finasteride (PROSCAR) 5 MG tablet Take 1 tablet (5 mg total) by mouth Cross.   fluticasone (FLONASE) 50 MCG/ACT nasal spray Place 2 sprays into both nostrils Cross.   furosemide (LASIX) 40 MG tablet Take 20 mg by mouth Cross.   gabapentin (NEURONTIN) 300 MG capsule TAKE (1) CAPSULE TWICE Cross.   hydroquinone 4 % cream APPLY TO AFFECTED AREAS TWICE A DAY   loratadine (CLARITIN) 10 MG tablet Take 1 tablet (10 mg total) by mouth Cross.   mupirocin ointment (BACTROBAN) 2 % Apply 2 (two) times Cross. To affected areas   pantoprazole (PROTONIX) 40 MG tablet Take 1 tablet (40 mg total) by mouth Cross. TAKE 1 TABLET ONCE Cross FOR REFLUX   pravastatin (PRAVACHOL) 40 MG tablet Take 1 tablet (40 mg total) by mouth Cross.   sertraline (ZOLOFT) 50 MG tablet Take 1 tablet (50 mg total) by mouth Cross.   tamsulosin (FLOMAX) 0.4 MG CAPS capsule TAKE (2) CAPSULES Cross.   No facility-administered encounter medications on file as of 01/23/2022.    Patient Active Problem List   Diagnosis Date Noted   Benign prostatic hyperplasia with nocturia 10/20/2019   Spondylosis of lumbar region without myelopathy or radiculopathy 11/10/2017   Lumbosacral spondylosis without myelopathy 11/10/2017   Gastroesophageal reflux disease with esophagitis 06/11/2017   Balanitis 10/17/2015   Hernia of abdominal cavity 08/16/2015   Essential hypertension 08/04/2015   Inguinal hernia 08/04/2015   Snoring 02/04/2014   Emphysema lung (HCC) 12/02/2013   Systolic dysfunction 11/11/2013   Diastolic  dysfunction 11/11/2013   PVC's (premature ventricular contractions) 10/20/2013   Mixed hyperlipidemia 10/20/2013   Benign prostatic hyperplasia 05/13/2013   Lower urinary tract symptoms 05/13/2013    Conditions to be addressed/monitored: CHF and HTN  Care Plan : [facility] Care Plan  Updates made by Taylor Daily, RN since 01/23/2022 12:00 AM     Problem:  Chronic Disease Management Needs Resolved 01/23/2022  Priority: High  Onset Date: 05/30/2021     Long-Range Goal: Work with RN Care Manager Regarding Care Management and Care Coordination Associated with HTN, CHF, and COPD Completed 01/23/2022  Start Date: 05/30/2021  Expected End Date: 05/30/2022  This Visit's Progress: On track  Recent Progress: On track  Priority: High  Note:   Current Barriers:  Chronic Disease Management support and education needs related to CHF, HTN, and Pulmonary Disease  RNCM Clinical Goal(s):  Patient will continue to work with RN Care Manager and/or Social Worker to address care management and care coordination needs related to CHF, HTN, and Pulmonary Disease as evidenced by adherence to CM Team Scheduled appointments     through collaboration with RN Care manager, provider, and care team.   Interventions: 1:1 collaboration with primary care provider regarding development and update of comprehensive plan of care as evidenced by provider attestation and co-signature Inter-disciplinary care team collaboration (see longitudinal plan of care) Evaluation of current treatment plan related to  self management and patient's adherence to plan as established by provider   Heart Failure Interventions:  (Status: Goal Met.)  Long Term Goal  Reminded of importance of Cross weight and advised patient to weigh and record each morning after emptying bladder Discussed the importance of keeping all appointments with provider Provided patient with education about the role of exercise in the management of heart failure Assessed social determinant of health barriers Reviewed and discussed medications and importance of compliance Encouraged patient to reach out to cardiologist or PCP with any new symptoms Encouraged increased activity level as tolerated with a goal of 150 minutes a week Reviewed and discussed recent cardiology visit   COPD: (Status: Goal Met.) Long Term Goal   Reviewed medications with patient, including use of prescribed maintenance and rescue inhalers, and provided instruction on medication management and the importance of adherence Advised patient to track and manage COPD triggers Advised patient to self assesses COPD action plan zone and make appointment with provider if in the yellow zone for 48 hours without improvement Advised patient to engage in light exercise as tolerated 3-5 days a week to aid in the the management of COPD Provided education about and advised patient to utilize infection prevention strategies to reduce risk of respiratory infection Assessed social determinant of health barriers Previously reviewed health maintenance recommendations and discussed Covid, pneumonia, and flu vaccines. Patient has had yearly flu vaccine. He has had both prevnar and pneumovax with Prevnar being the most recent in 2017. Advised to discuss with PCP if a pneumovax is needed after 5 years. Discussed persistent cough x 1 year and potential causes. He has talked with his provider about the cough. Advised to follow-up with PCP if he has any new or worsening symptoms   Hypertension: (Status: Goal Met.) Last practice recorded BP readings:  BP Readings from Last 3 Encounters:  05/11/21 128/74  04/19/21 107/64  04/13/21 108/68   Evaluation of current treatment plan related to hypertension self management and patient's adherence to plan as established by provider;   Reviewed medications with patient and discussed importance of  compliance;  Counseled on the importance of exercise goals with target of 150 minutes per week Advised patient, providing education and rationale, to monitor blood pressure Cross and record, calling PCP for findings outside established parameters;  Discussed complications of poorly controlled blood pressure such as heart disease, stroke, circulatory complications, vision complications, kidney impairment, sexual dysfunction;   Assessed social determinant of health barriers;    Patient Goals/Self-Care Activities: Take medications as prescribed   Attend all scheduled provider appointments Perform all self care activities independently  Perform IADL's (shopping, preparing meals, housekeeping, managing finances) independently Call provider office for new concerns or questions  check blood pressure 3 times per week write blood pressure results in a log or diary take blood pressure log to all doctor appointments call doctor for signs and symptoms of high blood pressure begin an exercise program report new symptoms to your doctor eat more whole grains, fruits and vegetables, lean meats and healthy fats Increase activity level with a goal of 150 minutes a week Talk with PCP about whether a pneumovax is appropriate for you  Plan: The patient has been provided with contact information for the care management team and has been advised to call with any health related questions or concerns.  No further follow up required: Goals met. Patient has been removed from CCM program at [facility]. He can be added back at any time if necessary.   Demetrios Loll, BSN, RN-BC Embedded Chronic Care Manager [facility] Care Management Direct Dial: [phone]

## 2022-01-26 DIAGNOSIS — I110 Hypertensive heart disease with heart failure: Secondary | ICD-10-CM

## 2022-01-26 DIAGNOSIS — J449 Chronic obstructive pulmonary disease, unspecified: Secondary | ICD-10-CM | POA: Diagnosis not present

## 2022-01-26 DIAGNOSIS — I509 Heart failure, unspecified: Secondary | ICD-10-CM

## 2022-02-09 ENCOUNTER — Telehealth: Payer: Self-pay | Admitting: *Deleted

## 2022-02-09 NOTE — Telephone Encounter (Signed)
-----   Message from Antoine Poche, MD sent at 01/31/2022  4:37 PM EDT ----- Labs look fine. Renal function remains decreased but significantly improved from prior check  Dominga Ferry MD

## 2022-02-09 NOTE — Telephone Encounter (Signed)
Lesle Chris, LPN  4/74/2595  2:30 PM EDT Back to Top    Notified, copy to pcp.

## 2022-02-20 ENCOUNTER — Ambulatory Visit (INDEPENDENT_AMBULATORY_CARE_PROVIDER_SITE_OTHER): Payer: Medicare Other | Admitting: Cardiology

## 2022-02-20 ENCOUNTER — Encounter: Payer: Self-pay | Admitting: Cardiology

## 2022-02-20 VITALS — BP 124/70 | HR 56 | Ht 68.0 in | Wt 202.8 lb

## 2022-02-20 DIAGNOSIS — I5022 Chronic systolic (congestive) heart failure: Secondary | ICD-10-CM

## 2022-02-20 MED ORDER — ISOSORBIDE MONONITRATE ER 30 MG PO TB24: 15.0000 mg | ORAL_TABLET | Freq: Every day | ORAL | 6 refills | Status: DC

## 2022-02-20 MED ORDER — HYDRALAZINE HCL 25 MG PO TABS: 12.5000 mg | ORAL_TABLET | Freq: Two times a day (BID) | ORAL | 6 refills | Status: DC

## 2022-02-20 NOTE — Progress Notes (Signed)
Clinical Summary Taylor Cross is a 76 y.o.male seen today for follow up of the following medical problems.   1. Chronic Systolic/Diastolic heart failure   - 10/2013 echo which showed LVEF 40-45%, mid-inferolateral wall hypokinesis, and grade II diastolic dysfunction.   - repeat echo 08/2014 LVEF 35-40%, basal to mid inferolateral and inferior walls, grade I diastolic dysfunction - completed lexiscan which showed moderate sized moderate to severe intensity inferolateral wall defect and small apical to mid inferior wall defect. Both areas were fixed and consistent with scar.      - lisinopril  stopped by other provider due to decreasing renal function.    12/2018 echo: LVEF 50-55%     - admitted 06/2020 with CHF exacerbation and pneumonia - echo from 07/20/2020 shows that EF is down to 35 to 40% from 50 to 55% back in June 2020, - home SBP's 100s.  - some orthostatic symptoms at times - breathing has done well since discharge     08/2020 echo LVEF 40-45%, grade I dd, normal RV   Low bp's and fatigue on coresg 72m bid, lowered to 12.523mbid 04/09/21 ER visit with SOB. Elevated BNP and CXR with volume overload Started on lasix 4077maily. He had not been on lasix at the time - symptoms much improved with diuretic     - elevation in Cr, off farxiga and lasix lowered. Cr was up to 1.9, more recently varying 1.5-1.7 - most recent labs 6/20 Cr 1.42 - chronic SOB mild. No recent edema.  Takes lasix 71m42mily, occasionaly 40mg76m Other medical issues not addressed this visit       2. CKD III - Cr has improved off ACE-I.        3. HL - 09/2021 TC 185 TG 145 HDL 50 LDL 109     4. HTN - compliant with meds Past Medical History:  Diagnosis Date   Acute respiratory failure with hypoxia and hypercapnia (HCC) [place]2165/68/1275ute systolic CHF (congestive heart failure) (HCC) [place]21/2021   Combined systolic and diastolic heart failure (HCC)    Hyperlipidemia      Allergies   Allergen Reactions   Neosporin [Neomycin-Bacitracin Zn-Polymyx] Rash   Voltaren [Diclofenac Sodium] Other (See Comments)    GI upset     Current Outpatient Medications  Medication Sig Dispense Refill   acetaminophen (TYLENOL) 325 MG tablet Take 2 tablets (650 mg total) by mouth every 6 (six) hours as needed for mild pain, fever or headache (or Fever >/= 101). 12 tablet 0   allopurinol (ZYLOPRIM) 100 MG tablet Take 1 tablet (100 mg total) by mouth daily. 90 tablet 3   aspirin 81 MG chewable tablet Chew 1 tablet (81 mg total) by mouth daily with breakfast. 120 tablet 3   carvedilol (COREG) 12.5 MG tablet Take 1 tablet (12.5 mg total) by mouth 2 (two) times daily. 180 tablet 3   finasteride (PROSCAR) 5 MG tablet Take 1 tablet (5 mg total) by mouth daily. 90 tablet 3   fluticasone (FLONASE) 50 MCG/ACT nasal spray Place 2 sprays into both nostrils daily. 16 g 11   furosemide (LASIX) 40 MG tablet Take 20 mg by mouth daily.     gabapentin (NEURONTIN) 300 MG capsule TAKE (1) CAPSULE TWICE DAILY. 180 capsule 0   hydroquinone 4 % cream APPLY TO AFFECTED AREAS TWICE A DAY 28.35 g 0   loratadine (CLARITIN) 10 MG tablet Take 1 tablet (10 mg total) by  mouth daily. 90 tablet 3   mupirocin ointment (BACTROBAN) 2 % Apply 2 (two) times daily. To affected areas 22 g 2   pantoprazole (PROTONIX) 40 MG tablet Take 1 tablet (40 mg total) by mouth daily. TAKE 1 TABLET ONCE DAILY FOR REFLUX 90 tablet 3   pravastatin (PRAVACHOL) 40 MG tablet Take 1 tablet (40 mg total) by mouth daily. 90 tablet 2   sertraline (ZOLOFT) 50 MG tablet Take 1 tablet (50 mg total) by mouth daily. 90 tablet 2   tamsulosin (FLOMAX) 0.4 MG CAPS capsule TAKE (2) CAPSULES DAILY. 180 capsule 3   No current facility-administered medications for this visit.     Past Surgical History:  Procedure Laterality Date   HERNIA REPAIR     TONSILLECTOMY       Allergies  Allergen Reactions   Neosporin [Neomycin-Bacitracin Zn-Polymyx] Rash    Voltaren [Diclofenac Sodium] Other (See Comments)    GI upset      Family History  Problem Relation Age of Onset   Heart disease Mother    Diabetes Mother    Heart failure Mother    COPD Father    Heart attack Brother 55     Social History Taylor Cross reports that he has never smoked. He has never used smokeless tobacco. Taylor Cross reports no history of alcohol use.   Review of Systems CONSTITUTIONAL: No weight loss, fever, chills, weakness or fatigue.  HEENT: Eyes: No visual loss, blurred vision, double vision or yellow sclerae.No hearing loss, sneezing, congestion, runny nose or sore throat.  SKIN: No rash or itching.  CARDIOVASCULAR: per hpi RESPIRATORY: No shortness of breath, cough or sputum.  GASTROINTESTINAL: No anorexia, nausea, vomiting or diarrhea. No abdominal pain or blood.  GENITOURINARY: No burning on urination, no polyuria NEUROLOGICAL: No headache, dizziness, syncope, paralysis, ataxia, numbness or tingling in the extremities. No change in bowel or bladder control.  MUSCULOSKELETAL: No muscle, back pain, joint pain or stiffness.  LYMPHATICS: No enlarged nodes. No history of splenectomy.  PSYCHIATRIC: No history of depression or anxiety.  ENDOCRINOLOGIC: No reports of sweating, cold or heat intolerance. No polyuria or polydipsia.  Marland Kitchen   Physical Examination Today's Vitals   02/20/22 1445  BP: 124/70  Pulse: (!) 56  SpO2: 96%  Weight: 202 lb 12.8 oz (92 kg)  Height: _0  (1.727 m)   Body mass index is 30.84 kg/m.  Gen: resting comfortably, no acute distress HEENT: no scleral icterus, pupils equal round and reactive, no palptable cervical adenopathy,  CV: RRR, no m/r/ gno jvd Resp: Clear to auscultation bilaterally GI: abdomen is soft, non-tender, non-distended, normal bowel sounds, no hepatosplenomegaly MSK: extremities are warm, no edema.  Skin: warm, no rash Neuro:  no focal deficits Psych: appropriate affect   Diagnostic Studies 10/2013 Echo    Left ventricle: The cavity size was mildly dilated. Wall thickness was normal. Systolic function was mildly to moderately reduced. The estimated ejection fraction is 40%. Features are consistent with a pseudonormal left ventricular filling pattern, with concomitant abnormal relaxation and increased filling pressure (grade 2 diastolic dysfunction). Doppler parameters are consistent with high ventricular filling pressure. - Regional wall motion abnormality: Mild to moderate hypokinesis of the mid inferolateral myocardium. The remaining walls are mildly hypokinetic. - Aortic valve: Trileaflet; mildly thickened, mildly calcified leaflets. There was no stenosis. - Mitral valve: Mildly thickened leaflets . Mild to moderate regurgitation. - Left atrium: The atrium was moderately dilated. - Tricuspid valve: Mild regurgitation. Inadequate TR jet to accurately assess  pulmonary pressures. - Pericardium, extracardiac: There was a left pleural effusion.   11/11/13 Lexiscan MPI   Gated imaging reveals an EDV of 223, ESV of 153, TID ratio 0.99, and LVEF of 31% with global hypokinesis, most prominent in the apex and inferolateral walls.  IMPRESSION: Intermediate risk Lexiscan Cardiolite. No diagnostic ST segment changes were noted to indicate ischemia, frequent PVCs were seen without sustained arrhythmia. Perfusion imaging is most consistent with scar affecting the inferior wall and inferolateral wall as outlined, no large ischemic zones however. LVEF is calculated at 31% with global hypokinesis that is most prominent at the apex and inferior wall. EDV indicates moderate to severe chamber dilatation. Findings are consistent with probable ischemic cardiomyopathy.     08/2014 echo Study Conclusions  - Left ventricle: The cavity size was mildly dilated. Wall   thickness was increased in a pattern of moderate LVH. Systolic   function was moderately reduced. The estimated ejection fraction    was in the range of 35% to 40%. There is hypokinesis to akinesis   of the basal-mid inferolateral and inferior myocardium. Doppler   parameters are consistent with abnormal left ventricular   relaxation (grade 1 diastolic dysfunction). - Aortic valve: Mildly to moderately calcified annulus. Trileaflet. - Mitral valve: Mildly calcified annulus. There was mild   regurgitation. - Left atrium: The atrium was mildly to moderately dilated. - Right atrium: Central venous pressure (est): 3 mm Hg. - Atrial septum: Possible PFO or secundum ASD with apparent left to   right flow noted by color Doppler. Cannot exclude vigorous venous   return adjacent to septum. Agitated saline study could be   considered for further evaluation. - Tricuspid valve: There was trivial regurgitation. - Pulmonary arteries: PA peak pressure: 17 mm Hg (S). - Pericardium, extracardiac: There was no pericardial effusion.  Recommendations:  Moderate LVH with mild LV chamber dilatation and LVEF approximately 35-40%. Hypokinesis to akinesis of the mid to basal inferolateral/inferior walls noted suggestive of ischemic cardiomyopathy. Grade 1 diastolic dysfunction. Compared to prior study in April 2015, wall motion has improved somewhat and ejection fraction looks to be higher, although graded in similar range last time. Mild to moderate left atrial enlargement. Mild MAC with mild mitral regurgitation. Possible PFO or secundum ASD with apparent left to right flow noted by color Doppler. Cannot exclude vigorous venous return adjacent to septum. Agitated saline study could be considered for further evaluation. Trivial tricuspid regurgitation with PASP normal range.             12/2018 echo 1. The left ventricle has low normal systolic function, with an ejection fraction of 50-55%. The cavity size was normal. There is mildly increased left ventricular wall thickness. Left ventricular diastolic Doppler parameters are consistent  with  impaired relaxation. Elevated mean left atrial pressure.  2. The right ventricle has normal systolic function. The cavity was normal. There is no increase in right ventricular wall thickness.  3. Left atrial size was moderately dilated.  4. No evidence of mitral valve stenosis.  5. The aortic valve is tricuspid. No stenosis of the aortic valve.  6. The aortic root is normal in size and structure.  7. Pulmonary hypertension is indeterminant, inadequate TR jet.     06/2020 echo IMPRESSIONS     1. Left ventricular ejection fraction, by estimation, is 35 to 40%. The  left ventricle has moderately decreased function. The left ventricle  demonstrates regional wall motion abnormalities (see scoring  diagram/findings for description). There is mild  left ventricular hypertrophy. Left ventricular diastolic parameters are  indeterminate.   2. Right ventricular systolic function is normal. The right ventricular  size is normal. Tricuspid regurgitation signal is inadequate for assessing  PA pressure.   3. Left atrial size was mild to moderately dilated.   4. The mitral valve is grossly normal. Mild mitral valve regurgitation.   5. The aortic valve is tricuspid. There is moderate calcification of the  aortic valve. Aortic valve regurgitation is not visualized. Mild aortic  valve stenosis. Aortic valve mean gradient measures 10.0 mmHg.   6. The inferior vena cava is normal in size with greater than 50%  respiratory variability, suggesting right atrial pressure of 3 mmHg.   7. Left pleural effusion is noted.     08/2020 echo IMPRESSIONS     1. Left ventricular ejection fraction, by estimation, is 40 to 45%. The  left ventricle has mildly decreased function. The left ventricle  demonstrates global hypokinesis. There is mild left ventricular  hypertrophy. Left ventricular diastolic parameters  are consistent with Grade I diastolic dysfunction (impaired relaxation).   2. Right ventricular  systolic function is normal. The right ventricular  size is normal.   3. Left atrial size was severely dilated.   4. The mitral valve is normal in structure. Trivial mitral valve  regurgitation. No evidence of mitral stenosis.   5. The aortic valve is tricuspid. There is mild calcification of the  aortic valve. There is mild thickening of the aortic valve. Aortic valve  regurgitation is not visualized. No aortic stenosis is present.   6. The inferior vena cava is normal in size with greater than 50%  respiratory variability, suggesting right atrial pressure of 3 mmHg.       Assessment and Plan   1. Chronic Systolic/diastolic heart failure -Medical therapy has been limited due to orthostatic dizziness, renal dysfunction - history of CKD, prior AKI on ACEI has not been on ACE/ARB/ARNI. AKI on farxiga, was discontinued. Given CKD and very sensitive renal function to meds have not tried aldactone. Will start hydral 12.27m bid and imdur 122mdaily.         JoCarlyle DollyD

## 2022-02-20 NOTE — Patient Instructions (Addendum)
Medication Instructions:  Begin Hydralazine 12.5mg  twice a day   Begin Imdur 15mg  daily  Continue all other medications.     Labwork: none  Testing/Procedures: none  Follow-Up: 4 months   Any Other Special Instructions Will Be Listed Below (If Applicable).   If you need a refill on your cardiac medications before your next appointment, please call your pharmacy.

## 2022-03-19 ENCOUNTER — Other Ambulatory Visit: Payer: Self-pay | Admitting: Family Medicine

## 2022-03-19 NOTE — Telephone Encounter (Signed)
Last office visit 10/18/2021 Last refill 12/19/2021, #180, no refills

## 2022-03-26 ENCOUNTER — Other Ambulatory Visit: Payer: Self-pay | Admitting: Family Medicine

## 2022-03-26 ENCOUNTER — Telehealth: Payer: Self-pay | Admitting: *Deleted

## 2022-03-26 NOTE — Patient Outreach (Signed)
  Care Coordination   03/26/2022 Name: Taylor Cross MRN: [id number] DOB: 04-01-1946   Care Coordination Outreach Attempts:  An unsuccessful telephone outreach was attempted today to offer the patient information about available care coordination services as a benefit of their health plan.   Follow Up Plan:  Additional outreach attempts will be made to offer the patient care coordination information and services.   Encounter Outcome:  No Answer  Care Coordination Interventions Activated:  No   Care Coordination Interventions:  No, not indicated    Blanchie Serve RN, BSN [facility] Care Management [facility] [phone] [name]

## 2022-04-03 ENCOUNTER — Other Ambulatory Visit: Payer: Self-pay | Admitting: Family Medicine

## 2022-04-19 ENCOUNTER — Ambulatory Visit (INDEPENDENT_AMBULATORY_CARE_PROVIDER_SITE_OTHER): Payer: Medicare Other | Admitting: Family Medicine

## 2022-04-19 ENCOUNTER — Encounter: Payer: Self-pay | Admitting: Family Medicine

## 2022-04-19 VITALS — BP 103/63 | HR 58 | Temp 97.0°F | Ht 68.0 in | Wt 196.2 lb

## 2022-04-19 DIAGNOSIS — N401 Enlarged prostate with lower urinary tract symptoms: Secondary | ICD-10-CM | POA: Diagnosis not present

## 2022-04-19 DIAGNOSIS — Z23 Encounter for immunization: Secondary | ICD-10-CM | POA: Diagnosis not present

## 2022-04-19 DIAGNOSIS — R351 Nocturia: Secondary | ICD-10-CM | POA: Diagnosis not present

## 2022-04-19 DIAGNOSIS — I10 Essential (primary) hypertension: Secondary | ICD-10-CM

## 2022-04-19 DIAGNOSIS — E782 Mixed hyperlipidemia: Secondary | ICD-10-CM | POA: Diagnosis not present

## 2022-04-19 MED ORDER — FINASTERIDE 5 MG PO TABS: 5.0000 mg | ORAL_TABLET | Freq: Every day | ORAL | 3 refills | Status: DC

## 2022-04-19 MED ORDER — TAMSULOSIN HCL 0.4 MG PO CAPS: ORAL_CAPSULE | ORAL | 3 refills | Status: DC

## 2022-04-19 NOTE — Progress Notes (Signed)
Subjective:  Patient ID: Taylor Cross, male    DOB: March 22, 1946  Age: 76 y.o. MRN: [id number]  CC: Medical Management of Chronic Issues   HPI Taylor Cross presents for  follow-up of hypertension. Patient has no history of headache chest pain or shortness of breath or recent cough. Patient also denies symptoms of TIA such as focal numbness or weakness. Patient denies side effects from medication. States taking it regularly.   in for follow-up of elevated cholesterol. Doing well without complaints on current medication. Denies side effects of statin including myalgia and arthralgia and nausea. Currently no chest pain, shortness of breath or other cardiovascular related symptoms noted.     History Taylor Cross has a past medical history of Acute respiratory failure with hypoxia and hypercapnia ([place]) (77/41/2878), Acute systolic CHF (congestive heart failure) ([place]) (07/19/2020), Combined systolic and diastolic heart failure ([place]), and Hyperlipidemia.   He has a past surgical history that includes Tonsillectomy and Hernia repair.   His family history includes COPD in his father; Diabetes in his mother; Heart attack (age of onset: 39) in his brother; Heart disease in his mother; Heart failure in his mother.He reports that he has never smoked. He has never used smokeless tobacco. He reports that he does not drink alcohol and does not use drugs.  Current Outpatient Medications on File Prior to Visit  Medication Sig Dispense Refill   acetaminophen (TYLENOL) 325 MG tablet Take 2 tablets (650 mg total) by mouth every 6 (six) hours as needed for mild pain, fever or headache (or Fever >/= 101). 12 tablet 0   allopurinol (ZYLOPRIM) 100 MG tablet Take 1 tablet (100 mg total) by mouth daily. 90 tablet 3   aspirin 81 MG chewable tablet Chew 1 tablet (81 mg total) by mouth daily with breakfast. 120 tablet 3   carvedilol (COREG) 12.5 MG tablet Take 1 tablet (12.5 mg total) by mouth 2 (two) times daily. 180 tablet  3   fluticasone (FLONASE) 50 MCG/ACT nasal spray Place 2 sprays into both nostrils daily. 16 g 11   furosemide (LASIX) 40 MG tablet Take 20 mg by mouth daily.     gabapentin (NEURONTIN) 300 MG capsule TAKE (1) CAPSULE TWICE DAILY. 180 capsule 0   hydrALAZINE (APRESOLINE) 25 MG tablet Take 0.5 tablets (12.5 mg total) by mouth 2 (two) times daily. 30 tablet 6   hydroquinone 4 % cream APPLY TO AFFECTED AREAS TWICE A DAY 28.35 g 0   isosorbide mononitrate (IMDUR) 30 MG 24 hr tablet Take 0.5 tablets (15 mg total) by mouth daily. 15 tablet 6   loratadine (CLARITIN) 10 MG tablet Take 1 tablet (10 mg total) by mouth daily. 90 tablet 3   mupirocin ointment (BACTROBAN) 2 % Apply 2 (two) times daily. To affected areas 22 g 2   pantoprazole (PROTONIX) 40 MG tablet Take 1 tablet (40 mg total) by mouth daily. TAKE 1 TABLET ONCE DAILY FOR REFLUX 90 tablet 3   pravastatin (PRAVACHOL) 40 MG tablet TAKE ONE TABLET ONCE DAILY 90 tablet 0   sertraline (ZOLOFT) 50 MG tablet TAKE ONE TABLET ONCE DAILY 90 tablet 0   No current facility-administered medications on file prior to visit.    ROS Review of Systems  Constitutional:  Negative for fever.  Respiratory:  Negative for shortness of breath.   Cardiovascular:  Negative for chest pain.  Musculoskeletal:  Negative for arthralgias.  Skin:  Negative for rash.    Objective:  BP 103/63   Pulse Marland Kitchen)  58   Temp (!) 97 F (36.1 C)   Ht 5' 8"  (1.727 m)   Wt 196 lb 3.2 oz (89 kg)   SpO2 93%   BMI 29.83 kg/m   BP Readings from Last 3 Encounters:  04/19/22 103/63  02/20/22 124/70  01/02/22 118/70    Wt Readings from Last 3 Encounters:  04/19/22 196 lb 3.2 oz (89 kg)  02/20/22 202 lb 12.8 oz (92 kg)  01/02/22 201 lb 6.4 oz (91.4 kg)     Physical Exam Vitals reviewed.  Constitutional:      Appearance: He is well-developed.  HENT:     Head: Normocephalic and atraumatic.     Right Ear: External ear normal.     Left Ear: External ear normal.      Mouth/Throat:     Pharynx: No oropharyngeal exudate or posterior oropharyngeal erythema.  Eyes:     Pupils: Pupils are equal, round, and reactive to light.  Cardiovascular:     Rate and Rhythm: Normal rate and regular rhythm.     Heart sounds: No murmur heard. Pulmonary:     Effort: No respiratory distress.     Breath sounds: Normal breath sounds.  Musculoskeletal:     Cervical back: Normal range of motion and neck supple.  Neurological:     Mental Status: He is alert and oriented to person, place, and time.       Assessment & Plan:   Taylor Cross was seen today for medical management of chronic issues.  Diagnoses and all orders for this visit:  Essential hypertension -     CBC with Differential/Platelet -     CMP14+EGFR  Mixed hyperlipidemia -     Lipid panel  Benign prostatic hyperplasia with nocturia -     finasteride (PROSCAR) 5 MG tablet; Take 1 tablet (5 mg total) by mouth daily. -     tamsulosin (FLOMAX) 0.4 MG CAPS capsule; TAKE (2) CAPSULES DAILY.   Allergies as of 04/19/2022       Reactions   Neosporin [neomycin-bacitracin Zn-polymyx] Rash   Voltaren [diclofenac Sodium] Other (See Comments)   GI upset        Medication List        Accurate as of April 19, 2022 11:06 AM. If you have any questions, ask your nurse or doctor.          acetaminophen 325 MG tablet Commonly known as: TYLENOL Take 2 tablets (650 mg total) by mouth every 6 (six) hours as needed for mild pain, fever or headache (or Fever >/= 101).   allopurinol 100 MG tablet Commonly known as: ZYLOPRIM Take 1 tablet (100 mg total) by mouth daily.   aspirin 81 MG chewable tablet Chew 1 tablet (81 mg total) by mouth daily with breakfast.   carvedilol 12.5 MG tablet Commonly known as: COREG Take 1 tablet (12.5 mg total) by mouth 2 (two) times daily.   finasteride 5 MG tablet Commonly known as: PROSCAR Take 1 tablet (5 mg total) by mouth daily.   fluticasone 50 MCG/ACT nasal  spray Commonly known as: FLONASE Place 2 sprays into both nostrils daily.   furosemide 40 MG tablet Commonly known as: LASIX Take 20 mg by mouth daily.   gabapentin 300 MG capsule Commonly known as: NEURONTIN TAKE (1) CAPSULE TWICE DAILY.   hydrALAZINE 25 MG tablet Commonly known as: APRESOLINE Take 0.5 tablets (12.5 mg total) by mouth 2 (two) times daily.   hydroquinone 4 % cream APPLY TO AFFECTED AREAS  TWICE A DAY   isosorbide mononitrate 30 MG 24 hr tablet Commonly known as: IMDUR Take 0.5 tablets (15 mg total) by mouth daily.   loratadine 10 MG tablet Commonly known as: CLARITIN Take 1 tablet (10 mg total) by mouth daily.   mupirocin ointment 2 % Commonly known as: BACTROBAN Apply 2 (two) times daily. To affected areas   pantoprazole 40 MG tablet Commonly known as: PROTONIX Take 1 tablet (40 mg total) by mouth daily. TAKE 1 TABLET ONCE DAILY FOR REFLUX   pravastatin 40 MG tablet Commonly known as: PRAVACHOL TAKE ONE TABLET ONCE DAILY   sertraline 50 MG tablet Commonly known as: ZOLOFT TAKE ONE TABLET ONCE DAILY   tamsulosin 0.4 MG Caps capsule Commonly known as: FLOMAX TAKE (2) CAPSULES DAILY.        Meds ordered this encounter  Medications   finasteride (PROSCAR) 5 MG tablet    Sig: Take 1 tablet (5 mg total) by mouth daily.    Dispense:  90 tablet    Refill:  3   tamsulosin (FLOMAX) 0.4 MG CAPS capsule    Sig: TAKE (2) CAPSULES DAILY.    Dispense:  180 capsule    Refill:  3    Arthrits manageable without med.  Is taking tylenol prn. Suggested 1 gm TID.  Follow-up: Return in about 6 months (around 10/18/2022).  Claretta Fraise, M.D.

## 2022-04-20 LAB — CMP14+EGFR
ALT: 14 IU/L (ref 0–44)
AST: 15 IU/L (ref 0–40)
Albumin/Globulin Ratio: 1.7 (ref 1.2–2.2)
Albumin: 4.3 g/dL (ref 3.8–4.8)
Alkaline Phosphatase: 93 IU/L (ref 44–121)
BUN/Creatinine Ratio: 14 (ref 10–24)
BUN: 23 mg/dL (ref 8–27)
Bilirubin Total: 0.6 mg/dL (ref 0.0–1.2)
CO2: 24 mmol/L (ref 20–29)
Calcium: 9.1 mg/dL (ref 8.6–10.2)
Chloride: 96 mmol/L (ref 96–106)
Creatinine, Ser: 1.62 mg/dL — ABNORMAL HIGH (ref 0.76–1.27)
Globulin, Total: 2.5 g/dL (ref 1.5–4.5)
Glucose: 104 mg/dL — ABNORMAL HIGH (ref 70–99)
Potassium: 4.6 mmol/L (ref 3.5–5.2)
Sodium: 137 mmol/L (ref 134–144)
Total Protein: 6.8 g/dL (ref 6.0–8.5)
eGFR: 44 mL/min/{1.73_m2} — ABNORMAL LOW (ref >59)

## 2022-04-20 LAB — CBC WITH DIFFERENTIAL/PLATELET
Basophils Absolute: 0 10*3/uL (ref 0.0–0.2)
Basos: 1 %
EOS (ABSOLUTE): 0.3 10*3/uL (ref 0.0–0.4)
Eos: 4 %
Hematocrit: 40.6 % (ref 37.5–51.0)
Hemoglobin: 14 g/dL (ref 13.0–17.7)
Immature Grans (Abs): 0 10*3/uL (ref 0.0–0.1)
Immature Granulocytes: 1 %
Lymphocytes Absolute: 1.7 10*3/uL (ref 0.7–3.1)
Lymphs: 27 %
MCH: 33.7 pg — ABNORMAL HIGH (ref 26.6–33.0)
MCHC: 34.5 g/dL (ref 31.5–35.7)
MCV: 98 fL — ABNORMAL HIGH (ref 79–97)
Monocytes Absolute: 0.7 10*3/uL (ref 0.1–0.9)
Monocytes: 11 %
Neutrophils Absolute: 3.5 10*3/uL (ref 1.4–7.0)
Neutrophils: 56 %
Platelets: 180 10*3/uL (ref 150–450)
RBC: 4.16 x10E6/uL (ref 4.14–5.80)
RDW: 13 % (ref 11.6–15.4)
WBC: 6.1 10*3/uL (ref 3.4–10.8)

## 2022-04-20 LAB — LIPID PANEL
Chol/HDL Ratio: 3.5 ratio (ref 0.0–5.0)
Cholesterol, Total: 163 mg/dL (ref 100–199)
HDL: 47 mg/dL (ref >39)
LDL Chol Calc (NIH): 91 mg/dL (ref 0–99)
Triglycerides: 140 mg/dL (ref 0–149)
VLDL Cholesterol Cal: 25 mg/dL (ref 5–40)

## 2022-05-08 ENCOUNTER — Other Ambulatory Visit: Payer: Self-pay | Admitting: *Deleted

## 2022-05-08 ENCOUNTER — Other Ambulatory Visit: Payer: Self-pay | Admitting: Cardiology

## 2022-05-08 MED ORDER — FUROSEMIDE 40 MG PO TABS: 20.0000 mg | ORAL_TABLET | Freq: Every day | ORAL | 3 refills | Status: DC

## 2022-05-14 ENCOUNTER — Other Ambulatory Visit: Payer: Self-pay | Admitting: *Deleted

## 2022-05-14 NOTE — Patient Outreach (Signed)
  Care Coordination   05/14/2022  Name: Taylor Cross MRN: [id number] DOB: 08-18-1945   Care Coordination Outreach Attempts:  A second unsuccessful outreach was attempted today to offer the patient with information about available care coordination services as a benefit of their health plan.   HIPAA compliant messages left on voicemail, providing contact information for CSW, encouraging patient to return CSW's call at his earliest convenience.    Follow Up Plan:  Additional outreach attempts will be made to offer the patient care coordination information and services.   Encounter Outcome:  No Answer.   Care Coordination Interventions Activated:  No.    Care Coordination Interventions:  No, not indicated.    Nat Christen, BSW, MSW, LCSW  Licensed Education officer, [facility]  Mailing [place] [address], [place], [place] [address] Physical Address[address], [place], [place] [address] Toll Free Main # [phone] Fax # [phone] Cell # [phone] [email or web address]

## 2022-05-21 NOTE — Telephone Encounter (Signed)
  Care Management   Outreach Note  05/21/2022  Name: Taylor Cross MRN: [id number] DOB: September 04, 1945  Care Coordination Outreach Attempts:  A third unsuccessful outreach was attempted today to offer the patient with information about available care coordination services as a benefit of their health plan. HIPAA compliant messages left on voicemail, providing contact information for CSW, encouraging patient to return CSW's call at his earliest convenience.  Follow Up Plan:  No further outreach attempts will be made at this time. We have been unable to contact the patient to offer or enroll patient in care coordination services.  Encounter Outcome:  No Answer.   Care Coordination Interventions Activated:  No.    Care Coordination Interventions:  No, not indicated.    Nat Christen, BSW, MSW, LCSW  Licensed Education officer, environmental [facility]  Mailing [place] [address], [place], [place] [address] Physical Address[address], [place], [place] [address] Toll Free Main # [phone] Fax # [phone] Cell # [phone] [email or web address]

## 2022-06-18 ENCOUNTER — Other Ambulatory Visit: Payer: Self-pay | Admitting: Family Medicine

## 2022-06-27 ENCOUNTER — Ambulatory Visit: Payer: Medicare Other | Attending: Cardiology | Admitting: Cardiology

## 2022-06-27 ENCOUNTER — Encounter: Payer: Self-pay | Admitting: Cardiology

## 2022-06-27 VITALS — BP 108/60 | HR 56 | Ht 68.0 in | Wt 199.4 lb

## 2022-06-27 DIAGNOSIS — I10 Essential (primary) hypertension: Secondary | ICD-10-CM | POA: Diagnosis present

## 2022-06-27 DIAGNOSIS — I5022 Chronic systolic (congestive) heart failure: Secondary | ICD-10-CM | POA: Insufficient documentation

## 2022-06-27 DIAGNOSIS — E782 Mixed hyperlipidemia: Secondary | ICD-10-CM | POA: Diagnosis present

## 2022-06-27 NOTE — Patient Instructions (Signed)

## 2022-06-27 NOTE — Progress Notes (Signed)
Clinical Summary Taylor Cross is a 76 y.o.male seen today for follow up of the following medical problems.   1. Chronic Systolic/Diastolic heart failure   - 10/2013 echo which showed LVEF 40-45%, mid-inferolateral wall hypokinesis, and grade II diastolic dysfunction.   - repeat echo 08/2014 LVEF 35-40%, basal to mid inferolateral and inferior walls, grade I diastolic dysfunction - completed lexiscan which showed moderate sized moderate to severe intensity inferolateral wall defect and small apical to mid inferior wall defect. Both areas were fixed and consistent with scar.      - lisinopril  stopped by other provider due to decreasing renal function.    12/2018 echo: LVEF 50-55%     - admitted 06/2020 with CHF exacerbation and pneumonia - echo from 07/20/2020 shows that EF is down to 35 to 40% from 50 to 55% back in June 2020, - home SBP's 100s.  - some orthostatic symptoms at times - breathing has done well since discharge     08/2020 echo LVEF 40-45%, grade I dd, normal RV   Low bp's and fatigue on coresg 23m bid, lowered to 12.563mbid 04/09/21 ER visit with SOB. Elevated BNP and CXR with volume overload Started on lasix 40109maily. He had not been on lasix at the time - symptoms much improved with diuretic     - elevation in Cr, off farxiga and lasix lowered. Cr was up to 1.9, more recently varying 1.5-1.7 - most recent labs 6/20 Cr 1.42 - chronic SOB mild. No recent edema.  Takes lasix 64m84mily, occasionaly 40mg81m  - mild chronic stable SOB, no chest pain, no LE edema - compliant with meds        2. CKD III - Cr has improved off ACE-I.        3. HL - 09/2021 TC 185 TG 145 HDL 50 LDL 109 03/2022 TC 163 TG 140 HDL 47 LDL 91   4. HTN - compliant with meds    Past Medical History:  Diagnosis Date   Acute respiratory failure with hypoxia and hypercapnia (HCC) [id number] systolic CHF (congestive heart failure) (HCC) [place]21/2021   Combined systolic and  diastolic heart failure (HCC)    Hyperlipidemia      Allergies  Allergen Reactions   Neosporin [Neomycin-Bacitracin Zn-Polymyx] Rash   Voltaren [Diclofenac Sodium] Other (See Comments)    GI upset     Current Outpatient Medications  Medication Sig Dispense Refill   acetaminophen (TYLENOL) 325 MG tablet Take 2 tablets (650 mg total) by mouth every 6 (six) hours as needed for mild pain, fever or headache (or Fever >/= 101). 12 tablet 0   allopurinol (ZYLOPRIM) 100 MG tablet Take 1 tablet (100 mg total) by mouth daily. 90 tablet 3   aspirin 81 MG chewable tablet Chew 1 tablet (81 mg total) by mouth daily with breakfast. 120 tablet 3   carvedilol (COREG) 12.5 MG tablet Take 1 tablet (12.5 mg total) by mouth 2 (two) times daily. 180 tablet 3   finasteride (PROSCAR) 5 MG tablet Take 1 tablet (5 mg total) by mouth daily. 90 tablet 3   fluticasone (FLONASE) 50 MCG/ACT nasal spray Place 2 sprays into both nostrils daily. 16 g 11   furosemide (LASIX) 40 MG tablet Take 0.5 tablets (20 mg total) by mouth daily. 45 tablet 3   gabapentin (NEURONTIN) 300 MG capsule TAKE ONE CAPSULE TWICE DAILY 180 capsule 0   hydrALAZINE (APRESOLINE) 25  MG tablet Take 0.5 tablets (12.5 mg total) by mouth 2 (two) times daily. 30 tablet 6   hydroquinone 4 % cream APPLY TO AFFECTED AREAS TWICE A DAY 28.35 g 0   isosorbide mononitrate (IMDUR) 30 MG 24 hr tablet Take 0.5 tablets (15 mg total) by mouth daily. 15 tablet 6   loratadine (CLARITIN) 10 MG tablet Take 1 tablet (10 mg total) by mouth daily. 90 tablet 3   mupirocin ointment (BACTROBAN) 2 % Apply 2 (two) times daily. To affected areas 22 g 2   pantoprazole (PROTONIX) 40 MG tablet Take 1 tablet (40 mg total) by mouth daily. TAKE 1 TABLET ONCE DAILY FOR REFLUX 90 tablet 3   pravastatin (PRAVACHOL) 40 MG tablet TAKE ONE TABLET ONCE DAILY 90 tablet 0   sertraline (ZOLOFT) 50 MG tablet TAKE ONE TABLET ONCE DAILY 90 tablet 0   tamsulosin (FLOMAX) 0.4 MG CAPS capsule TAKE  (2) CAPSULES DAILY. 180 capsule 3   No current facility-administered medications for this visit.     Past Surgical History:  Procedure Laterality Date   HERNIA REPAIR     TONSILLECTOMY       Allergies  Allergen Reactions   Neosporin [Neomycin-Bacitracin Zn-Polymyx] Rash   Voltaren [Diclofenac Sodium] Other (See Comments)    GI upset      Family History  Problem Relation Age of Onset   Heart disease Mother    Diabetes Mother    Heart failure Mother    COPD Father    Heart attack Brother 59     Social History Taylor Cross reports that he has never smoked. He has never used smokeless tobacco. Taylor Cross reports no history of alcohol use.   Review of Systems CONSTITUTIONAL: No weight loss, fever, chills, weakness or fatigue.  HEENT: Eyes: No visual loss, blurred vision, double vision or yellow sclerae.No hearing loss, sneezing, congestion, runny nose or sore throat.  SKIN: No rash or itching.  CARDIOVASCULAR: per hpi RESPIRATORY: No shortness of breath, cough or sputum.  GASTROINTESTINAL: No anorexia, nausea, vomiting or diarrhea. No abdominal pain or blood.  GENITOURINARY: No burning on urination, no polyuria NEUROLOGICAL: No headache, dizziness, syncope, paralysis, ataxia, numbness or tingling in the extremities. No change in bowel or bladder control.  MUSCULOSKELETAL: No muscle, back pain, joint pain or stiffness.  LYMPHATICS: No enlarged nodes. No history of splenectomy.  PSYCHIATRIC: No history of depression or anxiety.  ENDOCRINOLOGIC: No reports of sweating, cold or heat intolerance. No polyuria or polydipsia.  Marland Kitchen   Physical Examination Today's Vitals   06/27/22 1314  BP: 108/60  Pulse: (!) 56  SpO2: 97%  Weight: 199 lb 6.4 oz (90.4 kg)  Height: _0  (1.727 m)   Body mass index is 30.32 kg/m.  Gen: resting comfortably, no acute distress HEENT: no scleral icterus, pupils equal round and reactive, no palptable cervical adenopathy,  CV: RRR, no m/r/g  no jvd Resp: Clear to auscultation bilaterally GI: abdomen is soft, non-tender, non-distended, normal bowel sounds, no hepatosplenomegaly MSK: extremities are warm, no edema.  Skin: warm, no rash Neuro:  no focal deficits Psych: appropriate affect   Diagnostic Studies  10/2013 Echo   Left ventricle: The cavity size was mildly dilated. Wall thickness was normal. Systolic function was mildly to moderately reduced. The estimated ejection fraction is 40%. Features are consistent with a pseudonormal left ventricular filling pattern, with concomitant abnormal relaxation and increased filling pressure (grade 2 diastolic dysfunction). Doppler parameters are consistent with high ventricular filling pressure. -  Regional wall motion abnormality: Mild to moderate hypokinesis of the mid inferolateral myocardium. The remaining walls are mildly hypokinetic. - Aortic valve: Trileaflet; mildly thickened, mildly calcified leaflets. There was no stenosis. - Mitral valve: Mildly thickened leaflets . Mild to moderate regurgitation. - Left atrium: The atrium was moderately dilated. - Tricuspid valve: Mild regurgitation. Inadequate TR jet to accurately assess pulmonary pressures. - Pericardium, extracardiac: There was a left pleural effusion.   11/11/13 Lexiscan MPI   Gated imaging reveals an EDV of 223, ESV of 153, TID ratio 0.99, and LVEF of 31% with global hypokinesis, most prominent in the apex and inferolateral walls.  IMPRESSION: Intermediate risk Lexiscan Cardiolite. No diagnostic ST segment changes were noted to indicate ischemia, frequent PVCs were seen without sustained arrhythmia. Perfusion imaging is most consistent with scar affecting the inferior wall and inferolateral wall as outlined, no large ischemic zones however. LVEF is calculated at 31% with global hypokinesis that is most prominent at the apex and inferior wall. EDV indicates moderate to severe chamber dilatation. Findings  are consistent with probable ischemic cardiomyopathy.     08/2014 echo Study Conclusions  - Left ventricle: The cavity size was mildly dilated. Wall   thickness was increased in a pattern of moderate LVH. Systolic   function was moderately reduced. The estimated ejection fraction   was in the range of 35% to 40%. There is hypokinesis to akinesis   of the basal-mid inferolateral and inferior myocardium. Doppler   parameters are consistent with abnormal left ventricular   relaxation (grade 1 diastolic dysfunction). - Aortic valve: Mildly to moderately calcified annulus. Trileaflet. - Mitral valve: Mildly calcified annulus. There was mild   regurgitation. - Left atrium: The atrium was mildly to moderately dilated. - Right atrium: Central venous pressure (est): 3 mm Hg. - Atrial septum: Possible PFO or secundum ASD with apparent left to   right flow noted by color Doppler. Cannot exclude vigorous venous   return adjacent to septum. Agitated saline study could be   considered for further evaluation. - Tricuspid valve: There was trivial regurgitation. - Pulmonary arteries: PA peak pressure: 17 mm Hg (S). - Pericardium, extracardiac: There was no pericardial effusion.  Recommendations:  Moderate LVH with mild LV chamber dilatation and LVEF approximately 35-40%. Hypokinesis to akinesis of the mid to basal inferolateral/inferior walls noted suggestive of ischemic cardiomyopathy. Grade 1 diastolic dysfunction. Compared to prior study in April 2015, wall motion has improved somewhat and ejection fraction looks to be higher, although graded in similar range last time. Mild to moderate left atrial enlargement. Mild MAC with mild mitral regurgitation. Possible PFO or secundum ASD with apparent left to right flow noted by color Doppler. Cannot exclude vigorous venous return adjacent to septum. Agitated saline study could be considered for further evaluation. Trivial tricuspid regurgitation with  PASP normal range.             12/2018 echo 1. The left ventricle has low normal systolic function, with an ejection fraction of 50-55%. The cavity size was normal. There is mildly increased left ventricular wall thickness. Left ventricular diastolic Doppler parameters are consistent with  impaired relaxation. Elevated mean left atrial pressure.  2. The right ventricle has normal systolic function. The cavity was normal. There is no increase in right ventricular wall thickness.  3. Left atrial size was moderately dilated.  4. No evidence of mitral valve stenosis.  5. The aortic valve is tricuspid. No stenosis of the aortic valve.  6. The aortic root is  normal in size and structure.  7. Pulmonary hypertension is indeterminant, inadequate TR jet.     06/2020 echo IMPRESSIONS     1. Left ventricular ejection fraction, by estimation, is 35 to 40%. The  left ventricle has moderately decreased function. The left ventricle  demonstrates regional wall motion abnormalities (see scoring  diagram/findings for description). There is mild  left ventricular hypertrophy. Left ventricular diastolic parameters are  indeterminate.   2. Right ventricular systolic function is normal. The right ventricular  size is normal. Tricuspid regurgitation signal is inadequate for assessing  PA pressure.   3. Left atrial size was mild to moderately dilated.   4. The mitral valve is grossly normal. Mild mitral valve regurgitation.   5. The aortic valve is tricuspid. There is moderate calcification of the  aortic valve. Aortic valve regurgitation is not visualized. Mild aortic  valve stenosis. Aortic valve mean gradient measures 10.0 mmHg.   6. The inferior vena cava is normal in size with greater than 50%  respiratory variability, suggesting right atrial pressure of 3 mmHg.   7. Left pleural effusion is noted.     08/2020 echo IMPRESSIONS     1. Left ventricular ejection fraction, by estimation, is 40 to 45%.  The  left ventricle has mildly decreased function. The left ventricle  demonstrates global hypokinesis. There is mild left ventricular  hypertrophy. Left ventricular diastolic parameters  are consistent with Grade I diastolic dysfunction (impaired relaxation).   2. Right ventricular systolic function is normal. The right ventricular  size is normal.   3. Left atrial size was severely dilated.   4. The mitral valve is normal in structure. Trivial mitral valve  regurgitation. No evidence of mitral stenosis.   5. The aortic valve is tricuspid. There is mild calcification of the  aortic valve. There is mild thickening of the aortic valve. Aortic valve  regurgitation is not visualized. No aortic stenosis is present.   6. The inferior vena cava is normal in size with greater than 50%  respiratory variability, suggesting right atrial pressure of 3 mmHg.        Assessment and Plan  1. Chronic Systolic/diastolic heart failure -Medical therapy has been limited due to orthostatic dizziness, renal dysfunction - history of CKD, prior AKI on ACEI has not been on ACE/ARB/ARNI. AKI on farxiga, was discontinued. Given CKD and very sensitive renal function to meds have not tried aldactone. - no symptoms, euvolemic today. Continue current meds  2. HTN - at goal, continue current meds  3. Hyperlipidemia - at goal, continue pravatatin F/u 6 months        Arnoldo Lenis, M.D.

## 2022-07-02 ENCOUNTER — Other Ambulatory Visit: Payer: Self-pay | Admitting: Family Medicine

## 2022-09-10 ENCOUNTER — Other Ambulatory Visit: Payer: Self-pay | Admitting: Cardiology

## 2022-09-12 ENCOUNTER — Ambulatory Visit (INDEPENDENT_AMBULATORY_CARE_PROVIDER_SITE_OTHER): Payer: Medicare Other

## 2022-09-12 VITALS — Ht 68.0 in | Wt 190.0 lb

## 2022-09-12 DIAGNOSIS — Z0000 Encounter for general adult medical examination without abnormal findings: Secondary | ICD-10-CM

## 2022-09-12 NOTE — Patient Instructions (Signed)
Taylor Cross , Thank you for taking time to come for your Medicare Wellness Visit. I appreciate your ongoing commitment to your health goals. Please review the following plan we discussed and let me know if I can assist you in the future.   These are the goals we discussed:  Goals      DIET - INCREASE WATER INTAKE     Exercise 150 min/wk Moderate Activity        This is a list of the screening recommended for you and due dates:  Health Maintenance  Topic Date Due   Zoster (Shingles) Vaccine (2 of 2) 07/03/2018   COVID-19 Vaccine (5 - 2023-24 season) 03/30/2022   Medicare Annual Wellness Visit  09/13/2023   DTaP/Tdap/Td vaccine (3 - Td or Tdap) 10/31/2027   Pneumonia Vaccine  Completed   Flu Shot  Completed   Hepatitis C Screening: USPSTF Recommendation to screen - Ages 18-79 yo.  Completed   HPV Vaccine  Aged Out    Advanced directives: Advance directive discussed with you today. I have provided a copy for you to complete at home and have notarized. Once this is complete please bring a copy in to our office so we can scan it into your chart.   Conditions/risks identified: Aim for 30 minutes of exercise or brisk walking, 6-8 glasses of water, and 5 servings of fruits and vegetables each day.   Next appointment: Follow up in one year for your annual wellness visit.   Preventive Care 70 Years and Older, Male  Preventive care refers to lifestyle choices and visits with your health care provider that can promote health and wellness. What does preventive care include? A yearly physical exam. This is also called an annual well check. Dental exams once or twice a year. Routine eye exams. Ask your health care provider how often you should have your eyes checked. Personal lifestyle choices, including: Daily care of your teeth and gums. Regular physical activity. Eating a healthy diet. Avoiding tobacco and drug use. Limiting alcohol use. Practicing safe sex. Taking low doses of aspirin  every day. Taking vitamin and mineral supplements as recommended by your health care provider. What happens during an annual well check? The services and screenings done by your health care provider during your annual well check will depend on your age, overall health, lifestyle risk factors, and family history of disease. Counseling  Your health care provider may ask you questions about your: Alcohol use. Tobacco use. Drug use. Emotional well-being. Home and relationship well-being. Sexual activity. Eating habits. History of falls. Memory and ability to understand (cognition). Work and work Statistician. Screening  You may have the following tests or measurements: Height, weight, and BMI. Blood pressure. Lipid and cholesterol levels. These may be checked every 5 years, or more frequently if you are over 31 years old. Skin check. Lung cancer screening. You may have this screening every year starting at age 7 if you have a 30-pack-year history of smoking and currently smoke or have quit within the past 15 years. Fecal occult blood test (FOBT) of the stool. You may have this test every year starting at age 51. Flexible sigmoidoscopy or colonoscopy. You may have a sigmoidoscopy every 5 years or a colonoscopy every 10 years starting at age 63. Prostate cancer screening. Recommendations will vary depending on your family history and other risks. Hepatitis C blood test. Hepatitis B blood test. Sexually transmitted disease (STD) testing. Diabetes screening. This is done by checking your blood sugar (  glucose) after you have not eaten for a while (fasting). You may have this done every 1-3 years. Abdominal aortic aneurysm (AAA) screening. You may need this if you are a current or former smoker. Osteoporosis. You may be screened starting at age 23 if you are at high risk. Talk with your health care provider about your test results, treatment options, and if necessary, the need for more  tests. Vaccines  Your health care provider may recommend certain vaccines, such as: Influenza vaccine. This is recommended every year. Tetanus, diphtheria, and acellular pertussis (Tdap, Td) vaccine. You may need a Td booster every 10 years. Zoster vaccine. You may need this after age 64. Pneumococcal 13-valent conjugate (PCV13) vaccine. One dose is recommended after age 65. Pneumococcal polysaccharide (PPSV23) vaccine. One dose is recommended after age 83. Talk to your health care provider about which screenings and vaccines you need and how often you need them. This information is not intended to replace advice given to you by your health care provider. Make sure you discuss any questions you have with your health care provider. Document Released: 08/12/2015 Document Revised: 04/04/2016 Document Reviewed: 05/17/2015 Elsevier Interactive Patient Education  2017 [place] Prevention in the Home Falls can cause injuries. They can happen to people of all ages. There are many things you can do to make your home safe and to help prevent falls. What can I do on the outside of my home? Regularly fix the edges of walkways and driveways and fix any cracks. Remove anything that might make you trip as you walk through a door, such as a raised step or threshold. Trim any bushes or trees on the path to your home. Use bright outdoor lighting. Clear any walking paths of anything that might make someone trip, such as rocks or tools. Regularly check to see if handrails are loose or broken. Make sure that both sides of any steps have handrails. Any raised decks and porches should have guardrails on the edges. Have any leaves, snow, or ice cleared regularly. Use sand or salt on walking paths during winter. Clean up any spills in your garage right away. This includes oil or grease spills. What can I do in the bathroom? Use night lights. Install grab bars by the toilet and in the tub and shower.  Do not use towel bars as grab bars. Use non-skid mats or decals in the tub or shower. If you need to sit down in the shower, use a plastic, non-slip stool. Keep the floor dry. Clean up any water that spills on the floor as soon as it happens. Remove soap buildup in the tub or shower regularly. Attach bath mats securely with double-sided non-slip rug tape. Do not have throw rugs and other things on the floor that can make you trip. What can I do in the bedroom? Use night lights. Make sure that you have a light by your bed that is easy to reach. Do not use any sheets or blankets that are too big for your bed. They should not hang down onto the floor. Have a firm chair that has side arms. You can use this for support while you get dressed. Do not have throw rugs and other things on the floor that can make you trip. What can I do in the kitchen? Clean up any spills right away. Avoid walking on wet floors. Keep items that you use a lot in easy-to-reach places. If you need to reach something above you, use  a strong step stool that has a grab bar. Keep electrical cords out of the way. Do not use floor polish or wax that makes floors slippery. If you must use wax, use non-skid floor wax. Do not have throw rugs and other things on the floor that can make you trip. What can I do with my stairs? Do not leave any items on the stairs. Make sure that there are handrails on both sides of the stairs and use them. Fix handrails that are broken or loose. Make sure that handrails are as long as the stairways. Check any carpeting to make sure that it is firmly attached to the stairs. Fix any carpet that is loose or worn. Avoid having throw rugs at the top or bottom of the stairs. If you do have throw rugs, attach them to the floor with carpet tape. Make sure that you have a light switch at the top of the stairs and the bottom of the stairs. If you do not have them, ask someone to add them for you. What else  can I do to help prevent falls? Wear shoes that: Do not have high heels. Have rubber bottoms. Are comfortable and fit you well. Are closed at the toe. Do not wear sandals. If you use a stepladder: Make sure that it is fully opened. Do not climb a closed stepladder. Make sure that both sides of the stepladder are locked into place. Ask someone to hold it for you, if possible. Clearly mark and make sure that you can see: Any grab bars or handrails. First and last steps. Where the edge of each step is. Use tools that help you move around (mobility aids) if they are needed. These include: Canes. Walkers. Scooters. Crutches. Turn on the lights when you go into a dark area. Replace any light bulbs as soon as they burn out. Set up your furniture so you have a clear path. Avoid moving your furniture around. If any of your floors are uneven, fix them. If there are any pets around you, be aware of where they are. Review your medicines with your doctor. Some medicines can make you feel dizzy. This can increase your chance of falling. Ask your doctor what other things that you can do to help prevent falls. This information is not intended to replace advice given to you by your health care provider. Make sure you discuss any questions you have with your health care provider. Document Released: 05/12/2009 Document Revised: 12/22/2015 Document Reviewed: 08/20/2014 Elsevier Interactive Patient Education  2017 [employer].

## 2022-09-12 NOTE — Progress Notes (Signed)
Subjective:   Taylor Cross is a 77 y.o. male who presents for Medicare Annual/Subsequent preventive examination. I connected with  Taylor Cross on 09/12/22 by a audio enabled telemedicine application and verified that I am speaking with the correct person using two identifiers.  Patient Location: Home  Provider Location: Home Office  I discussed the limitations of evaluation and management by telemedicine. The patient expressed understanding and agreed to proceed.  Review of Systems     Cardiac Risk Factors include: advanced age (>30mn, >>26women);male gender;hypertension     Objective:    Today's Vitals   09/12/22 1443  Weight: 190 lb (86.2 kg)  Height: 5' 8"$  (1.727 m)   Body mass index is 28.89 kg/m.     09/12/2022    2:49 PM 07/26/2021    2:46 PM 04/09/2021    5:29 PM 07/19/2020   10:10 AM 07/19/2020    7:44 AM 10/17/2017   11:48 AM  Advanced Directives  Does Patient Have a Medical Advance Directive? Yes No No No Unable to assess, patient is non-responsive or altered mental status No  Type of AScientist, forensicPower of [place] will       Copy of [place]? No - copy requested       Would patient like information on creating a medical advance directive?  No - Patient declined No - Patient declined No - Patient declined  No - Patient declined    Current Medications (verified) Outpatient Encounter Medications as of 09/12/2022  Medication Sig   acetaminophen (TYLENOL) 325 MG tablet Take 2 tablets (650 mg total) by mouth every 6 (six) hours as needed for mild pain, fever or headache (or Fever >/= 101).   allopurinol (ZYLOPRIM) 100 MG tablet Take 1 tablet (100 mg total) by mouth daily.   aspirin 81 MG chewable tablet Chew 1 tablet (81 mg total) by mouth daily with breakfast.   carvedilol (COREG) 12.5 MG tablet Take 1 tablet (12.5 mg total) by mouth 2 (two) times daily.   finasteride (PROSCAR) 5 MG tablet Take 1 tablet (5 mg  total) by mouth daily.   fluticasone (FLONASE) 50 MCG/ACT nasal spray Place 2 sprays into both nostrils daily.   furosemide (LASIX) 40 MG tablet Take 0.5 tablets (20 mg total) by mouth daily.   gabapentin (NEURONTIN) 300 MG capsule TAKE ONE CAPSULE TWICE DAILY   hydrALAZINE (APRESOLINE) 25 MG tablet Take 0.5 tablets (12.5 mg total) by mouth 2 (two) times daily.   hydroquinone 4 % cream APPLY TO AFFECTED AREAS TWICE A DAY   isosorbide mononitrate (IMDUR) 30 MG 24 hr tablet TAKE 1/2 TABLET BY MOUTH DAILY   loratadine (CLARITIN) 10 MG tablet Take 1 tablet (10 mg total) by mouth daily.   mupirocin ointment (BACTROBAN) 2 % Apply 2 (two) times daily. To affected areas   pantoprazole (PROTONIX) 40 MG tablet Take 1 tablet (40 mg total) by mouth daily. TAKE 1 TABLET ONCE DAILY FOR REFLUX   pravastatin (PRAVACHOL) 40 MG tablet TAKE ONE TABLET ONCE DAILY   sertraline (ZOLOFT) 50 MG tablet TAKE ONE TABLET ONCE DAILY   tamsulosin (FLOMAX) 0.4 MG CAPS capsule TAKE (2) CAPSULES DAILY.   No facility-administered encounter medications on file as of 09/12/2022.    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[place])    Hyperlipidemia    Past Surgical History:  Procedure Laterality Date   HERNIA REPAIR     TONSILLECTOMY     Family History  Problem Relation Age of Onset   Heart disease Mother    Diabetes Mother    Heart failure Mother    COPD Father    Heart attack Brother 66   Social History   Socioeconomic History   Marital status: Single    Spouse name: Not on file   Number of children: 0   Years of education: some college   Highest education level: Some college, no degree  Occupational History    Occupation: Retired    Comment: Orthoptist  Tobacco Use   Smoking status: Never   Smokeless tobacco: Never  Scientific laboratory technician Use: Never used  Substance and Sexual Activity   Alcohol use: No    Alcohol/week: 0.0 standard drinks of alcohol   Drug use: No   Sexual activity: Not Currently  Other Topics Concern   Not on file  Social History Narrative   Not on file   Social Determinants of Health   Financial Resource Strain: Low Risk  (09/12/2022)   Overall Financial Resource Strain (CARDIA)    Difficulty of Paying Living Expenses: Not hard at all  Food Insecurity: No Food Insecurity (09/12/2022)   Hunger Vital Sign    Worried About Running Out of Food in the Last Year: Never true    [place] in the Last Year: Never true  Transportation Needs: No Transportation Needs (09/12/2022)   PRAPARE - Hydrologist (Medical): No    Lack of Transportation (Non-Medical): No  Physical Activity: Insufficiently Active (09/12/2022)   Exercise Vital Sign    Days of Exercise per Week: 3 days    Minutes of Exercise per Session: 30 min  Stress: No Stress Concern Present (09/12/2022)   [place]    Feeling of Stress : Not at all  Social Connections: Moderately Isolated (09/12/2022)   Social Connection and Isolation Panel [NHANES]    Frequency of Communication with Friends and Family: More than three times a week    Frequency of Social Gatherings with Friends and Family: More than three times a week    Attends Religious Services: More than 4 times per year    Active Member of [employer] or Organizations: No    Attends Music therapist: Never    Marital Status: Never married    Tobacco Counseling Counseling given: Not Answered   Clinical Intake:  Pre-visit preparation completed: Yes  Pain : No/denies pain     Nutritional Risks: None Diabetes: No  How often do you need to  have someone help you when you read instructions, pamphlets, or other written materials from your doctor or pharmacy?: 1 - Never  Diabetic?no   Interpreter Needed?: No  Information entered by :: Jadene Pierini, LPN   Activities of Daily Living    09/12/2022    2:49 PM  In your present state of health, do you have any difficulty performing the following activities:  Hearing? 0  Vision? 0  Difficulty concentrating or making decisions? 0  Walking or climbing stairs? 0  Dressing or bathing? 0  Doing errands, shopping? 0  Preparing Food and eating ? N  Using the Toilet? N  In the past  six months, have you accidently leaked urine? N  Do you have problems with loss of bowel control? N  Managing your Medications? N  Managing your Finances? N  Housekeeping or managing your Housekeeping? N    Patient Care Team: Claretta Fraise, MD as PCP - General (Family Medicine) Harl Bowie Alphonse Guild, MD as PCP - Cardiology (Cardiology)  Indicate any recent Medical Services you may have received from other than [facility] providers in the past year (date may be approximate).     Assessment:   This is a routine wellness examination for Taylor Cross.  Hearing/Vision screen Vision Screening - Comments:: Wears rx glasses - up to date with routine eye exams with  Dr.Shipiro   Dietary issues and exercise activities discussed: Current Exercise Habits: Home exercise routine, Type of exercise: walking, Time (Minutes): 30, Frequency (Times/Week): 3, Weekly Exercise (Minutes/Week): 90, Intensity: Mild, Exercise limited by: None identified   Goals Addressed             This Visit's Progress    DIET - INCREASE WATER INTAKE         Depression Screen    09/12/2022    2:49 PM 04/19/2022   10:17 AM 10/18/2021   10:31 AM 07/26/2021    2:44 PM 04/19/2021   10:15 AM 01/31/2021    4:35 PM 10/19/2020    1:25 PM  PHQ 2/9 Scores  PHQ - 2 Score 0 0 0 1 0 0 0  PHQ- 9 Score       0    Fall Risk    09/12/2022    2:45 PM  04/19/2022   10:17 AM 10/18/2021   10:31 AM 07/26/2021    2:47 PM 04/19/2021   10:26 AM  Fall Risk   Falls in the past year? 0 0 0 0 1  Number falls in past yr: 0   0 0  Injury with Fall? 0   0 0  Risk for fall due to : No Fall Risks   History of fall(s);Impaired balance/gait;Impaired mobility History of fall(s)  Follow up Falls prevention discussed   Falls prevention discussed Falls evaluation completed    FALL RISK PREVENTION PERTAINING TO THE HOME:  Any stairs in or around the home? Yes  If so, are there any without handrails? No  Home free of loose throw rugs in walkways, pet beds, electrical cords, etc? Yes  Adequate lighting in your home to reduce risk of falls? Yes   ASSISTIVE DEVICES UTILIZED TO PREVENT FALLS:  Life alert? No  Use of a cane, walker or w/c? No  Grab bars in the bathroom? Yes  Shower chair or bench in shower? Yes  Elevated toilet seat or a handicapped toilet? Yes       10/17/2017   11:55 AM  MMSE - Mini Mental State Exam  Orientation to time 5  Orientation to Place 5  Registration 3  Attention/ Calculation 5  Recall 3  Language- name 2 objects 1  Language- repeat 1  Language- follow 3 step command 3  Language- read & follow direction 1  Copy design 1        09/12/2022    2:50 PM 07/26/2021    3:10 PM 07/26/2021    2:50 PM  6CIT Screen  What Year? 0 points 0 points 0 points  What month? 0 points 0 points 0 points  What time? 0 points 0 points 0 points  Count back from 20 0 points 0 points 0 points  Months in  reverse 0 points 2 points 2 points  Repeat phrase 0 points 0 points 0 points  Total Score 0 points 2 points 2 points    Immunizations Immunization History  Administered Date(s) Administered   Fluad Quad(high Dose 65+) 04/16/2019, 04/21/2020, 04/19/2021, 04/19/2022   Influenza, Seasonal, Injecte, Preservative Fre 05/08/2018   Influenza,inj,Quad PF,6+ Mos 05/02/2015, 04/29/2017   Influenza,inj,quad, With Preservative 04/24/2016    Influenza-Unspecified 04/29/2013, 05/05/2014, 05/02/2015, 04/24/2016, 05/08/2018   Moderna Sars-Covid-2 Vaccination 09/14/2019, 10/12/2019, 05/31/2020, 02/28/2021   PNEUMOCOCCAL CONJUGATE-20 07/13/2021   Pneumococcal Conjugate-13 04/06/2016   Pneumococcal Polysaccharide-23 04/07/2013   Respiratory Syncytial Virus Vaccine,Recomb Aduvanted(Arexvy) 05/02/2022   Tdap 10/06/2013, 10/30/2017   Zoster Recombinat (Shingrix) 05/08/2018    TDAP status: Up to date  Flu Vaccine status: Up to date  Pneumococcal vaccine status: Up to date  Covid-19 vaccine status: Completed vaccines  Qualifies for Shingles Vaccine? Yes   Zostavax completed Yes   Shingrix Completed?: Yes  Screening Tests Health Maintenance  Topic Date Due   Zoster Vaccines- Shingrix (2 of 2) 07/03/2018   COVID-19 Vaccine (5 - 2023-24 season) 03/30/2022   Medicare Annual Wellness (AWV)  09/13/2023   DTaP/Tdap/Td (3 - Td or Tdap) 10/31/2027   Pneumonia Vaccine 52+ Years old  Completed   INFLUENZA VACCINE  Completed   Hepatitis C Screening  Completed   HPV VACCINES  Aged Out    Health Maintenance  Health Maintenance Due  Topic Date Due   Zoster Vaccines- Shingrix (2 of 2) 07/03/2018   COVID-19 Vaccine (5 - 2023-24 season) 03/30/2022    Colorectal cancer screening: No longer required.   Lung Cancer Screening: (Low Dose CT Chest recommended if Age 67-80 years, 30 pack-year currently smoking OR have quit w/in 15years.) does not qualify.   Lung Cancer Screening Referral: n/a  Additional Screening:  Hepatitis C Screening: does not qualify;   Vision Screening: Recommended annual ophthalmology exams for early detection of glaucoma and other disorders of the eye. Is the patient up to date with their annual eye exam?  Yes  Who is the provider or what is the name of the office in which the patient attends annual eye exams? Dr.Shipiro  If pt is not established with a provider, would they like to be referred to a provider  to establish care? No .   Dental Screening: Recommended annual dental exams for proper oral hygiene  Community Resource Referral / Chronic Care Management: CRR required this visit?  No   CCM required this visit?  No      Plan:     I have personally reviewed and noted the following in the patient's chart:   Medical and social history Use of alcohol, tobacco or illicit drugs  Current medications and supplements including opioid prescriptions. Patient is not currently taking opioid prescriptions. Functional ability and status Nutritional status Physical activity Advanced directives List of other physicians Hospitalizations, surgeries, and ER visits in previous 12 months Vitals Screenings to include cognitive, depression, and falls Referrals and appointments  In addition, I have reviewed and discussed with patient certain preventive protocols, quality metrics, and best practice recommendations. A written personalized care plan for preventive services as well as general preventive health recommendations were provided to patient.     Daphane Shepherd, LPN   [id number]   Nurse Notes: None

## 2022-09-18 ENCOUNTER — Other Ambulatory Visit: Payer: Self-pay | Admitting: Family Medicine

## 2022-09-18 ENCOUNTER — Other Ambulatory Visit: Payer: Self-pay | Admitting: Cardiology

## 2022-09-26 ENCOUNTER — Other Ambulatory Visit: Payer: Self-pay | Admitting: Family Medicine

## 2022-10-17 IMAGING — DX DG CHEST 1V PORT
1 series · 1 of 1 positions shown · non-contrast
Comparison: July 19, 2020.

CLINICAL DATA: Shortness of breath.

EXAM:
PORTABLE CHEST 1 VIEW

[chest ap]
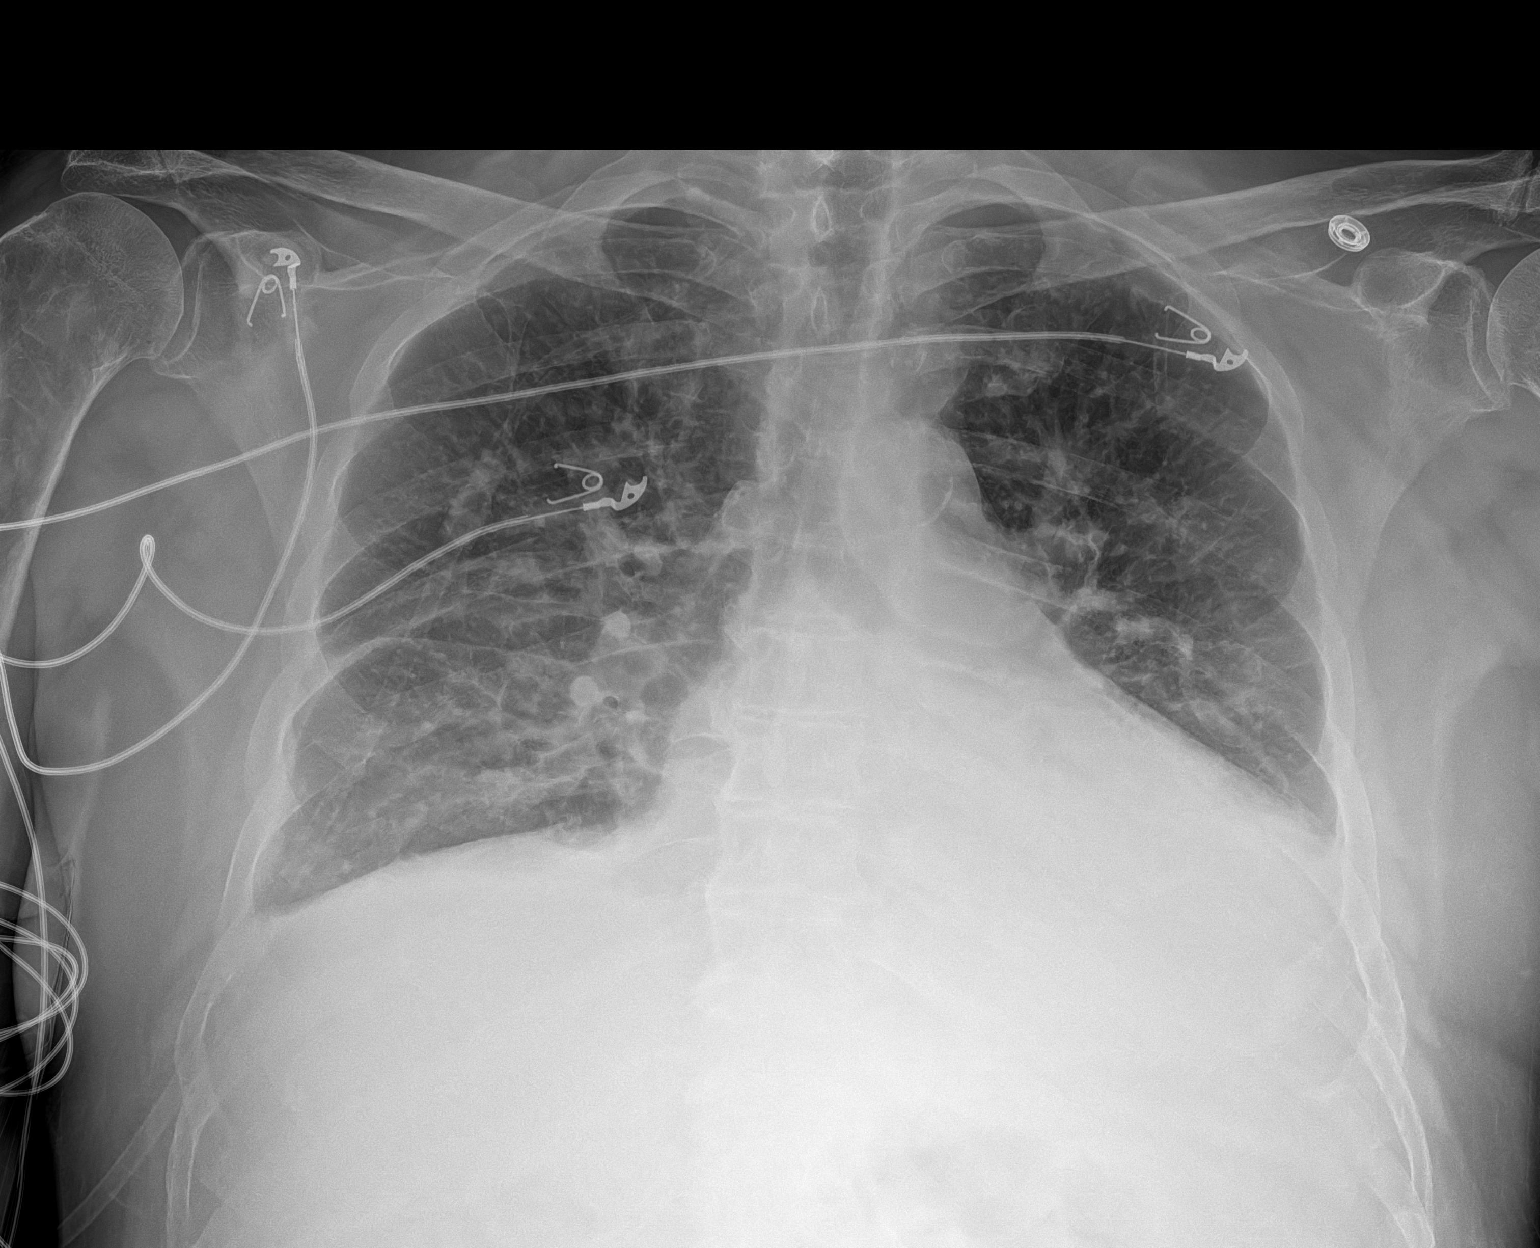

[1 of 1 positions shown; findings below may reference images not displayed]

FINDINGS: Pulmonary vascular congestion. Mildly enlarged cardiac silhouette.
Aortic atherosclerosis. Worsening dense retrocardiac opacity.
Improved interstitial prominence. Suspected small bilateral pleural
effusions. No visible pneumothorax.
IMPRESSION: 1. Improved interstitial prominence, suggesting improved pulmonary
edema.
2. Worsening dense retrocardiac opacity, which could represent
atelectasis, aspiration, and/or pneumonia.
3. Mild cardiomegaly with suspected small bilateral pleural
effusions. Pulmonary vascular congestion.

## 2022-10-18 ENCOUNTER — Encounter: Payer: Self-pay | Admitting: Family Medicine

## 2022-10-18 ENCOUNTER — Ambulatory Visit: Payer: Medicare Other | Admitting: Family Medicine

## 2022-10-22 ENCOUNTER — Other Ambulatory Visit: Payer: Self-pay | Admitting: Family Medicine

## 2022-10-22 DIAGNOSIS — N183 Chronic kidney disease, stage 3 unspecified: Secondary | ICD-10-CM

## 2022-10-30 ENCOUNTER — Ambulatory Visit: Payer: Medicare Other | Admitting: Family Medicine

## 2022-11-05 ENCOUNTER — Other Ambulatory Visit: Payer: Self-pay | Admitting: Family Medicine

## 2022-11-05 DIAGNOSIS — I10 Essential (primary) hypertension: Secondary | ICD-10-CM

## 2022-11-05 DIAGNOSIS — I5189 Other ill-defined heart diseases: Secondary | ICD-10-CM

## 2022-11-07 ENCOUNTER — Encounter: Payer: Self-pay | Admitting: Family Medicine

## 2022-11-07 ENCOUNTER — Ambulatory Visit (INDEPENDENT_AMBULATORY_CARE_PROVIDER_SITE_OTHER): Payer: Medicare Other | Admitting: Family Medicine

## 2022-11-07 VITALS — BP 116/65 | HR 61 | Temp 97.1°F | Ht 68.0 in | Wt 192.4 lb

## 2022-11-07 DIAGNOSIS — N183 Chronic kidney disease, stage 3 unspecified: Secondary | ICD-10-CM | POA: Diagnosis not present

## 2022-11-07 DIAGNOSIS — K2100 Gastro-esophageal reflux disease with esophagitis, without bleeding: Secondary | ICD-10-CM

## 2022-11-07 DIAGNOSIS — I10 Essential (primary) hypertension: Secondary | ICD-10-CM

## 2022-11-07 DIAGNOSIS — R351 Nocturia: Secondary | ICD-10-CM

## 2022-11-07 DIAGNOSIS — N401 Enlarged prostate with lower urinary tract symptoms: Secondary | ICD-10-CM

## 2022-11-07 DIAGNOSIS — E782 Mixed hyperlipidemia: Secondary | ICD-10-CM | POA: Diagnosis not present

## 2022-11-07 DIAGNOSIS — I5189 Other ill-defined heart diseases: Secondary | ICD-10-CM | POA: Diagnosis not present

## 2022-11-07 MED ORDER — CARVEDILOL 12.5 MG PO TABS: 12.5000 mg | ORAL_TABLET | Freq: Two times a day (BID) | ORAL | 3 refills | Status: DC

## 2022-11-07 MED ORDER — GABAPENTIN 300 MG PO CAPS: 300.0000 mg | ORAL_CAPSULE | Freq: Two times a day (BID) | ORAL | 3 refills | Status: DC

## 2022-11-07 MED ORDER — PANTOPRAZOLE SODIUM 40 MG PO TBEC: 40.0000 mg | DELAYED_RELEASE_TABLET | Freq: Every day | ORAL | 3 refills | Status: DC

## 2022-11-07 MED ORDER — PRAVASTATIN SODIUM 40 MG PO TABS: 40.0000 mg | ORAL_TABLET | Freq: Every day | ORAL | 3 refills | Status: DC

## 2022-11-07 MED ORDER — ALLOPURINOL 100 MG PO TABS: 100.0000 mg | ORAL_TABLET | Freq: Every day | ORAL | 3 refills | Status: DC

## 2022-11-07 MED ORDER — SERTRALINE HCL 50 MG PO TABS: 50.0000 mg | ORAL_TABLET | Freq: Every day | ORAL | 3 refills | Status: DC

## 2022-11-07 NOTE — Progress Notes (Signed)
Subjective:  Patient ID: Taylor Cross, male    DOB: 1946-06-14  Age: 77 y.o. MRN: [id number]  CC: Medical Management of Chronic Issues   HPI Taylor Cross presents for  follow-up of hypertension. Patient has no history of headache chest pain or shortness of breath or recent cough. Patient also denies symptoms of TIA such as focal numbness or weakness. Patient denies side effects from medication. States taking it regularly.   in for follow-up of elevated cholesterol. Doing well without complaints on current medication. Denies side effects of statin including myalgia and arthralgia and nausea. Currently no chest pain, shortness of breath or other cardiovascular related symptoms noted.  Patient in for follow-up of GERD. Currently asymptomatic taking  PPI daily. There is no chest pain or heartburn. No hematemesis and no melena. No dysphagia or choking. Onset is remote. Progression is stable. Complicating factors, none.   History Taylor Cross has a past medical history of Acute respiratory failure with hypoxia and hypercapnia (07/19/2020), Acute systolic CHF (congestive heart failure) (07/19/2020), Combined systolic and diastolic heart failure, and Hyperlipidemia.   He has a past surgical history that includes Tonsillectomy and Hernia repair.   His family history includes COPD in his father; Diabetes in his mother; Heart attack (age of onset: 45) in his brother; Heart disease in his mother; Heart failure in his mother.He reports that he has never smoked. He has never used smokeless tobacco. He reports that he does not drink alcohol and does not use drugs.  Current Outpatient Medications on File Prior to Visit  Medication Sig Dispense Refill   acetaminophen (TYLENOL) 325 MG tablet Take 2 tablets (650 mg total) by mouth every 6 (six) hours as needed for mild pain, fever or headache (or Fever >/= 101). 12 tablet 0   aspirin 81 MG chewable tablet Chew 1 tablet (81 mg total) by mouth daily with breakfast.  120 tablet 3   finasteride (PROSCAR) 5 MG tablet Take 1 tablet (5 mg total) by mouth daily. 90 tablet 3   fluticasone (FLONASE) 50 MCG/ACT nasal spray Place 2 sprays into both nostrils daily. (NEEDS TO BE SEEN BEFORE NEXT REFILL) 16 g 0   furosemide (LASIX) 40 MG tablet Take 0.5 tablets (20 mg total) by mouth daily. 45 tablet 3   hydrALAZINE (APRESOLINE) 25 MG tablet TAKE 1/2 TABLET BY MOUTH TWICE DAILY 30 tablet 6   hydroquinone 4 % cream APPLY TO AFFECTED AREAS TWICE A DAY 28.35 g 0   isosorbide mononitrate (IMDUR) 30 MG 24 hr tablet TAKE 1/2 TABLET BY MOUTH DAILY 45 tablet 1   loratadine (CLARITIN) 10 MG tablet Take 1 tablet (10 mg total) by mouth daily. 90 tablet 3   mupirocin ointment (BACTROBAN) 2 % Apply 2 (two) times daily. To affected areas 22 g 2   tamsulosin (FLOMAX) 0.4 MG CAPS capsule TAKE (2) CAPSULES DAILY. 180 capsule 3   No current facility-administered medications on file prior to visit.    ROS Review of Systems  Constitutional:  Negative for fever.  Respiratory:  Negative for shortness of breath.   Cardiovascular:  Negative for chest pain.  Genitourinary:  Positive for frequency (once or twice per night.).  Musculoskeletal:  Negative for arthralgias.  Skin:  Negative for rash.    Objective:  BP 116/65   Pulse 61   Temp (!) 97.1 F (36.2 C)   Ht 5\' 8"  (1.727 m)   Wt 192 lb 6.4 oz (87.3 kg)   SpO2 94%   BMI  29.25 kg/m   BP Readings from Last 3 Encounters:  11/07/22 116/65  06/27/22 108/60  04/19/22 103/63    Wt Readings from Last 3 Encounters:  11/07/22 192 lb 6.4 oz (87.3 kg)  09/12/22 190 lb (86.2 kg)  06/27/22 199 lb 6.4 oz (90.4 kg)     Physical Exam Vitals reviewed.  Constitutional:      Appearance: He is well-developed.  HENT:     Head: Normocephalic and atraumatic.     Right Ear: External ear normal.     Left Ear: External ear normal.     Mouth/Throat:     Pharynx: No oropharyngeal exudate or posterior oropharyngeal erythema.  Eyes:      Pupils: Pupils are equal, round, and reactive to light.  Cardiovascular:     Rate and Rhythm: Normal rate and regular rhythm.     Heart sounds: No murmur heard. Pulmonary:     Effort: No respiratory distress.     Breath sounds: Normal breath sounds.  Musculoskeletal:     Cervical back: Normal range of motion and neck supple.  Neurological:     Mental Status: He is alert and oriented to person, place, and time.       Assessment & Plan:   Taylor Cross was seen today for medical management of chronic issues.  Diagnoses and all orders for this visit:  Essential hypertension -     CBC with Differential/Platelet -     CMP14+EGFR -     carvedilol (COREG) 12.5 MG tablet; Take 1 tablet (12.5 mg total) by mouth 2 (two) times daily.  Mixed hyperlipidemia -     Lipid panel  Chronic renal impairment, stage 3 (moderate), unspecified whether stage 3a or 3b CKD -     allopurinol (ZYLOPRIM) 100 MG tablet; Take 1 tablet (100 mg total) by mouth daily. -     Uric acid  Diastolic dysfunction -     carvedilol (COREG) 12.5 MG tablet; Take 1 tablet (12.5 mg total) by mouth 2 (two) times daily.  Gastroesophageal reflux disease with esophagitis without hemorrhage -     pantoprazole (PROTONIX) 40 MG tablet; Take 1 tablet (40 mg total) by mouth daily. TAKE 1 TABLET ONCE DAILY FOR REFLUX  Benign prostatic hyperplasia with nocturia  Other orders -     gabapentin (NEURONTIN) 300 MG capsule; Take 1 capsule (300 mg total) by mouth 2 (two) times daily. -     pravastatin (PRAVACHOL) 40 MG tablet; Take 1 tablet (40 mg total) by mouth daily. -     sertraline (ZOLOFT) 50 MG tablet; Take 1 tablet (50 mg total) by mouth daily.   Allergies as of 11/07/2022       Reactions   Neosporin [neomycin-bacitracin Zn-polymyx] Rash   Voltaren [diclofenac Sodium] Other (See Comments)   GI upset        Medication List        Accurate as of November 07, 2022  2:26 PM. If you have any questions, ask your nurse or  doctor.          acetaminophen 325 MG tablet Commonly known as: TYLENOL Take 2 tablets (650 mg total) by mouth every 6 (six) hours as needed for mild pain, fever or headache (or Fever >/= 101).   allopurinol 100 MG tablet Commonly known as: ZYLOPRIM Take 1 tablet (100 mg total) by mouth daily.   aspirin 81 MG chewable tablet Chew 1 tablet (81 mg total) by mouth daily with breakfast.   carvedilol 12.5 MG  tablet Commonly known as: COREG Take 1 tablet (12.5 mg total) by mouth 2 (two) times daily. What changed: additional instructions Changed by: [name], MD   finasteride 5 MG tablet Commonly known as: PROSCAR Take 1 tablet (5 mg total) by mouth daily.   fluticasone 50 MCG/ACT nasal spray Commonly known as: FLONASE Place 2 sprays into both nostrils daily. (NEEDS TO BE SEEN BEFORE NEXT REFILL)   furosemide 40 MG tablet Commonly known as: LASIX Take 0.5 tablets (20 mg total) by mouth daily.   gabapentin 300 MG capsule Commonly known as: NEURONTIN Take 1 capsule (300 mg total) by mouth 2 (two) times daily.   hydrALAZINE 25 MG tablet Commonly known as: APRESOLINE TAKE 1/2 TABLET BY MOUTH TWICE DAILY   hydroquinone 4 % cream APPLY TO AFFECTED AREAS TWICE A DAY   isosorbide mononitrate 30 MG 24 hr tablet Commonly known as: IMDUR TAKE 1/2 TABLET BY MOUTH DAILY   loratadine 10 MG tablet Commonly known as: CLARITIN Take 1 tablet (10 mg total) by mouth daily.   mupirocin ointment 2 % Commonly known as: BACTROBAN Apply 2 (two) times daily. To affected areas   pantoprazole 40 MG tablet Commonly known as: PROTONIX Take 1 tablet (40 mg total) by mouth daily. TAKE 1 TABLET ONCE DAILY FOR REFLUX   pravastatin 40 MG tablet Commonly known as: PRAVACHOL Take 1 tablet (40 mg total) by mouth daily.   sertraline 50 MG tablet Commonly known as: ZOLOFT Take 1 tablet (50 mg total) by mouth daily.   tamsulosin 0.4 MG Caps capsule Commonly known as: FLOMAX TAKE (2)  CAPSULES DAILY.        Meds ordered this encounter  Medications   allopurinol (ZYLOPRIM) 100 MG tablet    Sig: Take 1 tablet (100 mg total) by mouth daily.    Dispense:  90 tablet    Refill:  3   carvedilol (COREG) 12.5 MG tablet    Sig: Take 1 tablet (12.5 mg total) by mouth 2 (two) times daily.    Dispense:  180 tablet    Refill:  3   gabapentin (NEURONTIN) 300 MG capsule    Sig: Take 1 capsule (300 mg total) by mouth 2 (two) times daily.    Dispense:  180 capsule    Refill:  3   pantoprazole (PROTONIX) 40 MG tablet    Sig: Take 1 tablet (40 mg total) by mouth daily. TAKE 1 TABLET ONCE DAILY FOR REFLUX    Dispense:  90 tablet    Refill:  3   pravastatin (PRAVACHOL) 40 MG tablet    Sig: Take 1 tablet (40 mg total) by mouth daily.    Dispense:  90 tablet    Refill:  3   sertraline (ZOLOFT) 50 MG tablet    Sig: Take 1 tablet (50 mg total) by mouth daily.    Dispense:  90 tablet    Refill:  3       Follow-up: Return in about 6 months (around 05/09/2023).  [name], M.D.

## 2022-11-08 ENCOUNTER — Other Ambulatory Visit: Payer: Medicare Other

## 2022-11-08 LAB — URIC ACID

## 2022-11-08 LAB — LIPID PANEL

## 2022-11-09 LAB — CMP14+EGFR
ALT: 13 IU/L (ref 0–44)
AST: 13 IU/L (ref 0–40)
Albumin/Globulin Ratio: 1.6 (ref 1.2–2.2)
Albumin: 3.9 g/dL (ref 3.8–4.8)
Alkaline Phosphatase: 88 IU/L (ref 44–121)
BUN/Creatinine Ratio: 12 (ref 10–24)
BUN: 16 mg/dL (ref 8–27)
Bilirubin Total: 0.6 mg/dL (ref 0.0–1.2)
CO2: 22 mmol/L (ref 20–29)
Calcium: 8.8 mg/dL (ref 8.6–10.2)
Chloride: 98 mmol/L (ref 96–106)
Creatinine, Ser: 1.33 mg/dL — ABNORMAL HIGH (ref 0.76–1.27)
Globulin, Total: 2.4 g/dL (ref 1.5–4.5)
Glucose: 97 mg/dL (ref 70–99)
Potassium: 4.4 mmol/L (ref 3.5–5.2)
Sodium: 135 mmol/L (ref 134–144)
Total Protein: 6.3 g/dL (ref 6.0–8.5)
eGFR: 55 mL/min/{1.73_m2} — ABNORMAL LOW (ref >59)

## 2022-11-09 LAB — CBC WITH DIFFERENTIAL/PLATELET
Basophils Absolute: 0 10*3/uL (ref 0.0–0.2)
Basos: 1 %
EOS (ABSOLUTE): 0.2 10*3/uL (ref 0.0–0.4)
Eos: 3 %
Hematocrit: 40.1 % (ref 37.5–51.0)
Hemoglobin: 13.7 g/dL (ref 13.0–17.7)
Immature Grans (Abs): 0 10*3/uL (ref 0.0–0.1)
Immature Granulocytes: 0 %
Lymphocytes Absolute: 1.5 10*3/uL (ref 0.7–3.1)
Lymphs: 26 %
MCH: 33.7 pg — ABNORMAL HIGH (ref 26.6–33.0)
MCHC: 34.2 g/dL (ref 31.5–35.7)
MCV: 99 fL — ABNORMAL HIGH (ref 79–97)
Monocytes Absolute: 0.7 10*3/uL (ref 0.1–0.9)
Monocytes: 12 %
Neutrophils Absolute: 3.4 10*3/uL (ref 1.4–7.0)
Neutrophils: 58 %
Platelets: 167 10*3/uL (ref 150–450)
RBC: 4.07 x10E6/uL — ABNORMAL LOW (ref 4.14–5.80)
RDW: 12.8 % (ref 11.6–15.4)
WBC: 5.9 10*3/uL (ref 3.4–10.8)

## 2022-11-09 LAB — LIPID PANEL
Chol/HDL Ratio: 3.5 ratio (ref 0.0–5.0)
Cholesterol, Total: 175 mg/dL (ref 100–199)
HDL: 50 mg/dL (ref >39)
LDL Chol Calc (NIH): 91 mg/dL (ref 0–99)
Triglycerides: 201 mg/dL — ABNORMAL HIGH (ref 0–149)
VLDL Cholesterol Cal: 34 mg/dL (ref 5–40)

## 2022-11-13 NOTE — Progress Notes (Signed)
Hello Taylor Cross,  Your lab result is normal and/or stable.Some minor variations that are not significant are commonly marked abnormal, but do not represent any medical problem for you.  Best regards, Nechuma Boven, M.D.

## 2022-12-03 ENCOUNTER — Other Ambulatory Visit: Payer: Self-pay | Admitting: Family Medicine

## 2023-01-03 ENCOUNTER — Ambulatory Visit: Payer: Medicare Other | Attending: Cardiology | Admitting: Cardiology

## 2023-01-03 VITALS — BP 114/65 | HR 53 | Ht 68.0 in | Wt 189.0 lb

## 2023-01-03 DIAGNOSIS — E782 Mixed hyperlipidemia: Secondary | ICD-10-CM | POA: Diagnosis present

## 2023-01-03 DIAGNOSIS — I5022 Chronic systolic (congestive) heart failure: Secondary | ICD-10-CM

## 2023-01-03 DIAGNOSIS — I10 Essential (primary) hypertension: Secondary | ICD-10-CM | POA: Diagnosis present

## 2023-01-03 NOTE — Progress Notes (Signed)
Clinical Summary Taylor Cross is a 77 y.o.male seen today for follow up of the following medical problems.   1. Chronic Systolic/Diastolic heart failure   - 10/2013 echo which showed LVEF 40-45%, mid-inferolateral wall hypokinesis, and grade II diastolic dysfunction.   - repeat echo 08/2014 LVEF 35-40%, basal to mid inferolateral and inferior walls, grade I diastolic dysfunction - completed lexiscan which showed moderate sized moderate to severe intensity inferolateral wall defect and small apical to mid inferior wall defect. Both areas were fixed and consistent with scar.      - lisinopril  stopped by other provider due to decreasing renal function.    12/2018 echo: LVEF 50-55%     - admitted 06/2020 with CHF exacerbation and pneumonia - echo from 07/20/2020 shows that EF is down to 35 to 40% from 50 to 55% back in June 2020, - home SBP's 100s.  - some orthostatic symptoms at times - breathing has done well since discharge     08/2020 echo LVEF 40-45%, grade I dd, normal RV   Low bp's and fatigue on coresg 25mg  bid, lowered to 12.5mg  bid 04/09/21 ER visit with SOB. Elevated BNP and CXR with volume overload Started on lasix 40mg  daily. He had not been on lasix at the time - symptoms much improved with diuretic     - elevation in Cr, off farxiga and lasix lowered. Cr was up to 1.9, more recently varying 1.5-1.7 - most recent labs 6/20 Cr 1.42 - chronic SOB mild. No recent edema.  Takes lasix 20mg  daily, occasionaly 40mg        - no recent LE edema, chronic stable SOB.  - compliant with meds       2. CKD III - Cr has improved off ACE-I.  - last Cr 10/2022 had improved to 1.3     3. HL - 09/2021 TC 185 TG 145 HDL 50 LDL 109 03/2022 TC 163 TG 140 HDL 47 LDL 91 10/2022 TC 175 TG 201 HDL 50 LDL 91   4. HTN - compliant with meds  - home bp 120s/60s Past Medical History:  Diagnosis Date   Acute respiratory failure with hypoxia and hypercapnia (HCC) 07/19/2020   Acute  systolic CHF (congestive heart failure) (HCC) 07/19/2020   Combined systolic and diastolic heart failure (HCC)    Hyperlipidemia      Allergies  Allergen Reactions   Neosporin [Neomycin-Bacitracin Zn-Polymyx] Rash   Voltaren [Diclofenac Sodium] Other (See Comments)    GI upset     Current Outpatient Medications  Medication Sig Dispense Refill   acetaminophen (TYLENOL) 325 MG tablet Take 2 tablets (650 mg total) by mouth every 6 (six) hours as needed for mild pain, fever or headache (or Fever >/= 101). 12 tablet 0   allopurinol (ZYLOPRIM) 100 MG tablet Take 1 tablet (100 mg total) by mouth daily. 90 tablet 3   aspirin 81 MG chewable tablet Chew 1 tablet (81 mg total) by mouth daily with breakfast. 120 tablet 3   carvedilol (COREG) 12.5 MG tablet Take 1 tablet (12.5 mg total) by mouth 2 (two) times daily. 180 tablet 3   finasteride (PROSCAR) 5 MG tablet Take 1 tablet (5 mg total) by mouth daily. 90 tablet 3   fluticasone (FLONASE) 50 MCG/ACT nasal spray USE 2 SPRAYS IN EACH NOSTRIL ONCE DAILY 16 g 4   furosemide (LASIX) 40 MG tablet Take 0.5 tablets (20 mg total) by mouth daily. 45 tablet 3   gabapentin (NEURONTIN)  300 MG capsule Take 1 capsule (300 mg total) by mouth 2 (two) times daily. 180 capsule 3   hydrALAZINE (APRESOLINE) 25 MG tablet TAKE 1/2 TABLET BY MOUTH TWICE DAILY 30 tablet 6   hydroquinone 4 % cream APPLY TO AFFECTED AREAS TWICE A DAY 28.35 g 0   isosorbide mononitrate (IMDUR) 30 MG 24 hr tablet TAKE 1/2 TABLET BY MOUTH DAILY 45 tablet 1   loratadine (CLARITIN) 10 MG tablet Take 1 tablet (10 mg total) by mouth daily. 90 tablet 3   mupirocin ointment (BACTROBAN) 2 % Apply 2 (two) times daily. To affected areas 22 g 2   pantoprazole (PROTONIX) 40 MG tablet Take 1 tablet (40 mg total) by mouth daily. TAKE 1 TABLET ONCE DAILY FOR REFLUX 90 tablet 3   pravastatin (PRAVACHOL) 40 MG tablet Take 1 tablet (40 mg total) by mouth daily. 90 tablet 3   sertraline (ZOLOFT) 50 MG tablet  Take 1 tablet (50 mg total) by mouth daily. 90 tablet 3   tamsulosin (FLOMAX) 0.4 MG CAPS capsule TAKE (2) CAPSULES DAILY. 180 capsule 3   No current facility-administered medications for this visit.     Past Surgical History:  Procedure Laterality Date   HERNIA REPAIR     TONSILLECTOMY       Allergies  Allergen Reactions   Neosporin [Neomycin-Bacitracin Zn-Polymyx] Rash   Voltaren [Diclofenac Sodium] Other (See Comments)    GI upset      Family History  Problem Relation Age of Onset   Heart disease Mother    Diabetes Mother    Heart failure Mother    COPD Father    Heart attack Brother 29     Social History Taylor Cross reports that he has never smoked. He has never used smokeless tobacco. Taylor Cross reports no history of alcohol use.   Review of Systems CONSTITUTIONAL: No weight loss, fever, chills, weakness or fatigue.  HEENT: Eyes: No visual loss, blurred vision, double vision or yellow sclerae.No hearing loss, sneezing, congestion, runny nose or sore throat.  SKIN: No rash or itching.  CARDIOVASCULAR: per hpi RESPIRATORY: No shortness of breath, cough or sputum.  GASTROINTESTINAL: No anorexia, nausea, vomiting or diarrhea. No abdominal pain or blood.  GENITOURINARY: No burning on urination, no polyuria NEUROLOGICAL: No headache, dizziness, syncope, paralysis, ataxia, numbness or tingling in the extremities. No change in bowel or bladder control.  MUSCULOSKELETAL: No muscle, back pain, joint pain or stiffness.  LYMPHATICS: No enlarged nodes. No history of splenectomy.  PSYCHIATRIC: No history of depression or anxiety.  ENDOCRINOLOGIC: No reports of sweating, cold or heat intolerance. No polyuria or polydipsia.  Marland Kitchen   Physical Examination Today's Vitals   01/03/23 1537 01/03/23 1605  BP: 134/76 114/65  Pulse: (!) 53   SpO2: 95%   Weight: 189 lb (85.7 kg)   Height: 5\' 8"  (1.727 m)    Body mass index is 28.74 kg/m.  Gen: resting comfortably, no acute  distress HEENT: no scleral icterus, pupils equal round and reactive, no palptable cervical adenopathy,  CV: RRR, no mrg, no jvd Resp: Clear to auscultation bilaterally GI: abdomen is soft, non-tender, non-distended, normal bowel sounds, no hepatosplenomegaly MSK: extremities are warm, no edema.  Skin: warm, no rash Neuro:  no focal deficits Psych: appropriate affect   Diagnostic Studies  10/2013 Echo   Left ventricle: The cavity size was mildly dilated. Wall thickness was normal. Systolic function was mildly to moderately reduced. The estimated ejection fraction is 40%. Features are consistent with  a pseudonormal left ventricular filling pattern, with concomitant abnormal relaxation and increased filling pressure (grade 2 diastolic dysfunction). Doppler parameters are consistent with high ventricular filling pressure. - Regional wall motion abnormality: Mild to moderate hypokinesis of the mid inferolateral myocardium. The remaining walls are mildly hypokinetic. - Aortic valve: Trileaflet; mildly thickened, mildly calcified leaflets. There was no stenosis. - Mitral valve: Mildly thickened leaflets . Mild to moderate regurgitation. - Left atrium: The atrium was moderately dilated. - Tricuspid valve: Mild regurgitation. Inadequate TR jet to accurately assess pulmonary pressures. - Pericardium, extracardiac: There was a left pleural effusion.   11/11/13 Lexiscan MPI   Gated imaging reveals an EDV of 223, ESV of 153, TID ratio 0.99, and LVEF of 31% with global hypokinesis, most prominent in the apex and inferolateral walls.  IMPRESSION: Intermediate risk Lexiscan Cardiolite. No diagnostic ST segment changes were noted to indicate ischemia, frequent PVCs were seen without sustained arrhythmia. Perfusion imaging is most consistent with scar affecting the inferior wall and inferolateral wall as outlined, no large ischemic zones however. LVEF is calculated at 31% with global  hypokinesis that is most prominent at the apex and inferior wall. EDV indicates moderate to severe chamber dilatation. Findings are consistent with probable ischemic cardiomyopathy.     08/2014 echo Study Conclusions  - Left ventricle: The cavity size was mildly dilated. Wall   thickness was increased in a pattern of moderate LVH. Systolic   function was moderately reduced. The estimated ejection fraction   was in the range of 35% to 40%. There is hypokinesis to akinesis   of the basal-mid inferolateral and inferior myocardium. Doppler   parameters are consistent with abnormal left ventricular   relaxation (grade 1 diastolic dysfunction). - Aortic valve: Mildly to moderately calcified annulus. Trileaflet. - Mitral valve: Mildly calcified annulus. There was mild   regurgitation. - Left atrium: The atrium was mildly to moderately dilated. - Right atrium: Central venous pressure (est): 3 mm Hg. - Atrial septum: Possible PFO or secundum ASD with apparent left to   right flow noted by color Doppler. Cannot exclude vigorous venous   return adjacent to septum. Agitated saline study could be   considered for further evaluation. - Tricuspid valve: There was trivial regurgitation. - Pulmonary arteries: PA peak pressure: 17 mm Hg (S). - Pericardium, extracardiac: There was no pericardial effusion.  Recommendations:  Moderate LVH with mild LV chamber dilatation and LVEF approximately 35-40%. Hypokinesis to akinesis of the mid to basal inferolateral/inferior walls noted suggestive of ischemic cardiomyopathy. Grade 1 diastolic dysfunction. Compared to prior study in April 2015, wall motion has improved somewhat and ejection fraction looks to be higher, although graded in similar range last time. Mild to moderate left atrial enlargement. Mild MAC with mild mitral regurgitation. Possible PFO or secundum ASD with apparent left to right flow noted by color Doppler. Cannot exclude vigorous venous  return adjacent to septum. Agitated saline study could be considered for further evaluation. Trivial tricuspid regurgitation with PASP normal range.             12/2018 echo 1. The left ventricle has low normal systolic function, with an ejection fraction of 50-55%. The cavity size was normal. There is mildly increased left ventricular wall thickness. Left ventricular diastolic Doppler parameters are consistent with  impaired relaxation. Elevated mean left atrial pressure.  2. The right ventricle has normal systolic function. The cavity was normal. There is no increase in right ventricular wall thickness.  3. Left atrial size was moderately  dilated.  4. No evidence of mitral valve stenosis.  5. The aortic valve is tricuspid. No stenosis of the aortic valve.  6. The aortic root is normal in size and structure.  7. Pulmonary hypertension is indeterminant, inadequate TR jet.     06/2020 echo IMPRESSIONS     1. Left ventricular ejection fraction, by estimation, is 35 to 40%. The  left ventricle has moderately decreased function. The left ventricle  demonstrates regional wall motion abnormalities (see scoring  diagram/findings for description). There is mild  left ventricular hypertrophy. Left ventricular diastolic parameters are  indeterminate.   2. Right ventricular systolic function is normal. The right ventricular  size is normal. Tricuspid regurgitation signal is inadequate for assessing  PA pressure.   3. Left atrial size was mild to moderately dilated.   4. The mitral valve is grossly normal. Mild mitral valve regurgitation.   5. The aortic valve is tricuspid. There is moderate calcification of the  aortic valve. Aortic valve regurgitation is not visualized. Mild aortic  valve stenosis. Aortic valve mean gradient measures 10.0 mmHg.   6. The inferior vena cava is normal in size with greater than 50%  respiratory variability, suggesting right atrial pressure of 3 mmHg.   7. Left  pleural effusion is noted.     08/2020 echo IMPRESSIONS     1. Left ventricular ejection fraction, by estimation, is 40 to 45%. The  left ventricle has mildly decreased function. The left ventricle  demonstrates global hypokinesis. There is mild left ventricular  hypertrophy. Left ventricular diastolic parameters  are consistent with Grade I diastolic dysfunction (impaired relaxation).   2. Right ventricular systolic function is normal. The right ventricular  size is normal.   3. Left atrial size was severely dilated.   4. The mitral valve is normal in structure. Trivial mitral valve  regurgitation. No evidence of mitral stenosis.   5. The aortic valve is tricuspid. There is mild calcification of the  aortic valve. There is mild thickening of the aortic valve. Aortic valve  regurgitation is not visualized. No aortic stenosis is present.   6. The inferior vena cava is normal in size with greater than 50%  respiratory variability, suggesting right atrial pressure of 3 mmHg.        Assessment and Plan   1. Chronic Systolic/diastolic heart failure -Medical therapy has been limited due to orthostatic dizziness, renal dysfunction - history of CKD, prior AKI on ACEI has not been on ACE/ARB/ARNI. AKI on farxiga, was discontinued. Given CKD and very sensitive renal function to meds have not tried aldactone. -denies any symptoms, continue current meds   2. HTN - at goal, he will continue current meds    3. Hyperlipidemia - LDL at goal, continue current meds  F/u 6 months       Antoine Poche, M.D.

## 2023-01-03 NOTE — Patient Instructions (Addendum)

## 2023-03-11 ENCOUNTER — Other Ambulatory Visit: Payer: Self-pay | Admitting: Cardiology

## 2023-03-30 ENCOUNTER — Emergency Department (HOSPITAL_COMMUNITY)
Admission: EM | Admit: 2023-03-30 | Discharge: 2023-03-30 | Disposition: A | Discharge status: Home / Self Care | Payer: Medicare Other | Attending: Emergency Medicine | Admitting: Emergency Medicine

## 2023-03-30 ENCOUNTER — Encounter (HOSPITAL_COMMUNITY): Payer: Self-pay

## 2023-03-30 ENCOUNTER — Other Ambulatory Visit: Payer: Self-pay

## 2023-03-30 DIAGNOSIS — S4992XA Unspecified injury of left shoulder and upper arm, initial encounter: Secondary | ICD-10-CM | POA: Diagnosis present

## 2023-03-30 DIAGNOSIS — Z7982 Long term (current) use of aspirin: Secondary | ICD-10-CM | POA: Insufficient documentation

## 2023-03-30 DIAGNOSIS — I509 Heart failure, unspecified: Secondary | ICD-10-CM | POA: Diagnosis not present

## 2023-03-30 DIAGNOSIS — S41112A Laceration without foreign body of left upper arm, initial encounter: Secondary | ICD-10-CM | POA: Diagnosis not present

## 2023-03-30 DIAGNOSIS — W268XXA Contact with other sharp object(s), not elsewhere classified, initial encounter: Secondary | ICD-10-CM | POA: Insufficient documentation

## 2023-03-30 MED ORDER — MUPIROCIN 2 % EX OINT: 1.0000 | TOPICAL_OINTMENT | Freq: Two times a day (BID) | CUTANEOUS | 0 refills | Status: DC

## 2023-03-30 NOTE — ED Triage Notes (Signed)
Skin tear to LEFT forearm Happened Wednesday evening at [employer]  Covered with Band-Aid in triage   Taylor Cross in triage, pt stated that is HR is always in the 40-50's

## 2023-03-30 NOTE — Discharge Instructions (Signed)
As discussed, recommend antibiotic ointment over area of skin tear as well as washing area gently with warm soapy water and daily bandage changes.  Look for signs of developing infection as we discussed such as redness, increased pain, fever.  Otherwise, area should heal on its own.  Please do not hesitate to return to emergency department for worrisome signs and symptoms we discussed to become apparent.

## 2023-03-30 NOTE — ED Provider Notes (Signed)
[place] EMERGENCY DEPARTMENT AT [facility] Provider Note   CSN: [id number] Arrival date & time: 03/30/23  1024     History  Chief Complaint  Patient presents with   Abrasion    Taylor Cross is a 77 y.o. male.  HPI   77 year old male presents emergency department with complaints of cut on his left forearm.  Patient states that incident occurred Wednesday.  States that he was checking out at [facility] when the bag rotary struck him on the outside of his right forearm.  Patient states that he had 2 small skin tears.  Has been changing bandages out daily.  Was told by a friend to be seen by provider due to it continuing to ooze blood as well as for evaluation for potential infection.  Patient states he has been applying Neosporin on the wounds but developed some slight skin irritation so he has since stopped.  Denies any fever, chills, weakness or sensory deficits distal to injury on affected side.  Past medical history significant for CHF, hyperlipidemia, emphysema  Home Medications Prior to Admission medications   Medication Sig Start Date End Date Taking? Authorizing Provider  mupirocin ointment (BACTROBAN) 2 % Apply 1 Application topically 2 (two) times daily. 03/30/23  Yes Sherian Maroon A, PA  acetaminophen (TYLENOL) 325 MG tablet Take 2 tablets (650 mg total) by mouth every 6 (six) hours as needed for mild pain, fever or headache (or Fever >/= 101). 07/22/20   Shon Hale, MD  allopurinol (ZYLOPRIM) 100 MG tablet Take 1 tablet (100 mg total) by mouth daily. 11/07/22   [name], MD  aspirin 81 MG chewable tablet Chew 1 tablet (81 mg total) by mouth daily with breakfast. 07/22/20   Shon Hale, MD  carvedilol (COREG) 12.5 MG tablet Take 1 tablet (12.5 mg total) by mouth 2 (two) times daily. 11/07/22   [name], MD  finasteride (PROSCAR) 5 MG tablet Take 1 tablet (5 mg total) by mouth daily. 04/19/22   [name], MD  fluticasone (FLONASE) 50  MCG/ACT nasal spray USE 2 SPRAYS IN EACH NOSTRIL ONCE DAILY 12/03/22   [name], MD  furosemide (LASIX) 40 MG tablet Take 0.5 tablets (20 mg total) by mouth daily. 05/08/22   Antoine Poche, MD  gabapentin (NEURONTIN) 300 MG capsule Take 1 capsule (300 mg total) by mouth 2 (two) times daily. 11/07/22   [name], MD  hydrALAZINE (APRESOLINE) 25 MG tablet TAKE 1/2 TABLET BY MOUTH TWICE DAILY 09/18/22   Antoine Poche, MD  hydroquinone 4 % cream APPLY TO AFFECTED AREAS TWICE A DAY 09/09/18   [name], MD  isosorbide mononitrate (IMDUR) 30 MG 24 hr tablet TAKE 1/2 TABLET BY MOUTH DAILY 03/11/23   Antoine Poche, MD  loratadine (CLARITIN) 10 MG tablet Take 1 tablet (10 mg total) by mouth daily. 11/23/21   [name], MD  pantoprazole (PROTONIX) 40 MG tablet Take 1 tablet (40 mg total) by mouth daily. TAKE 1 TABLET ONCE DAILY FOR REFLUX 11/07/22   [name], MD  pravastatin (PRAVACHOL) 40 MG tablet Take 1 tablet (40 mg total) by mouth daily. 11/07/22   [name], MD  sertraline (ZOLOFT) 50 MG tablet Take 1 tablet (50 mg total) by mouth daily. 11/07/22   [name], MD  tamsulosin (FLOMAX) 0.4 MG CAPS capsule TAKE (2) CAPSULES DAILY. 04/19/22   [name], MD      Allergies    Neosporin [neomycin-bacitracin zn-polymyx] and Voltaren [diclofenac sodium]  Review of Systems   Review of Systems  All other systems reviewed and are negative.   Physical Exam Updated Vital Signs BP (!) 116/57 (BP Location: Right Arm)   Pulse (!) 57   Temp 98.2 F (36.8 C) (Oral)   Resp 16   Ht 5\' 8"  (1.727 m)   Wt 86.2 kg   SpO2 97%   BMI 28.89 kg/m  Physical Exam Vitals and nursing note reviewed.  Constitutional:      General: He is not in acute distress.    Appearance: He is well-developed.  HENT:     Head: Normocephalic and atraumatic.  Eyes:     Conjunctiva/sclera: Conjunctivae normal.  Cardiovascular:     Rate and Rhythm: Normal rate and regular rhythm.   Pulmonary:     Effort: Pulmonary effort is normal. No respiratory distress.  Abdominal:     Palpations: Abdomen is soft.     Tenderness: There is no abdominal tenderness.  Musculoskeletal:        General: No swelling.     Cervical back: Neck supple.     Comments: Patient with full range of motion of left shoulder, elbow, wrist, digits.  Radial pulses 2+ bilaterally.  No sensory deficits along major nerve distributions of upper extremities.  Patient with 2 small skin tears noted on the dorsal aspect of left forearm.  Areas measure 2 to 2.5 cm and are V shaped in appearance.  No surrounding erythema, palpable fluctuance/induration.  Mild oozing of blood from 1 wound more proximal.  Skin:    General: Skin is warm and dry.     Capillary Refill: Capillary refill takes less than 2 seconds.  Neurological:     Mental Status: He is alert.  Psychiatric:        Mood and Affect: Mood normal.     ED Results / Procedures / Treatments   Labs (all labs ordered are listed, but only abnormal results are displayed) Labs Reviewed - No data to display  EKG None  Radiology No results found.  Procedures Procedures    Medications Ordered in ED Medications - No data to display  ED Course/ Medical Decision Making/ A&P                                 Medical Decision Making Risk Prescription drug management.   This patient presents to the ED for concern of skin tear, this involves an extensive number of treatment options, and is a complaint that carries with it a high risk of complications and morbidity.  The differential diagnosis includes fracture, strain/pain, dislocation, ligamentous/tendinous injury, neurovascular compromise, cellulitis, erysipelas, abscess, foreign body retainment   Co morbidities that complicate the patient evaluation  See HPI   Additional history obtained:  Additional history obtained from EMR External records from outside source obtained and reviewed  including hospital records   Lab Tests:  N/a   Imaging Studies ordered:  N/a   Cardiac Monitoring: / EKG:  The patient was maintained on a cardiac monitor.  I personally viewed and interpreted the cardiac monitored which showed an underlying rhythm of: Sinus bradycardia   Consultations Obtained:  N/a   Problem List / ED Course / Critical interventions / Medication management  Skin tear Reevaluation of the patient showed that the patient stayed the same I have reviewed the patients home medicines and have made adjustments as needed   Social Determinants of Health:  Denies  tobacco, licit drug use   Test / Admission - Considered:  Skin tear Vitals signs significant for heart rate in the mid 50s.  Patient with history of bradycardia being managed by cardiology in the outpatient setting.  Seems to be near patient's baseline from prior office visits.. Otherwise within normal range and stable throughout visit. 77 year old male presents emergency department with complaints of skin tear to left forearm.  On exam, 2 small skin tears appreciated without secondary signs of infectious process.  Area cleaned well in the ED with small amount of "quick clot" applied to the more proximal skin tear given oozing of blood with subsequent hemostasis.  Antibiotic ointment was applied to lower wound with Tegaderm over top.  Will recommend continued use of antibiotic ointment, daily bandage change and follow-up with primary care.  Treatment plan discussed at length with patient and he acknowledged understanding was agreeable to said plan.  Patient overall well-appearing, afebrile in no acute distress. Worrisome signs and symptoms were discussed with the patient, and the patient acknowledged understanding to return to the ED if noticed. Patient was stable upon discharge.          Final Clinical Impression(s) / ED Diagnoses Final diagnoses:  Skin tear of left upper arm without complication,  initial encounter    Rx / DC Orders ED Discharge Orders          Ordered    mupirocin ointment (BACTROBAN) 2 %  2 times daily        03/30/23 1122              Peter Garter, [place] 03/30/23 1206    Bethann Berkshire, MD 03/30/23 1706

## 2023-04-02 ENCOUNTER — Other Ambulatory Visit: Payer: Self-pay | Admitting: Cardiology

## 2023-04-08 ENCOUNTER — Other Ambulatory Visit: Payer: Self-pay | Admitting: Cardiology

## 2023-04-15 ENCOUNTER — Telehealth: Payer: Self-pay

## 2023-04-15 NOTE — Telephone Encounter (Signed)
Transition Care Management Follow-up Telephone Call Date of discharge and from where: Taylor Cross 8/31 How have you been since you were released from the hospital? Patient stated his arm is well and healing, no follow ups are needed  Any questions or concerns? No  Items Reviewed: Did the pt receive and understand the discharge instructions provided? Yes  Medications obtained and verified? Yes  Other? No  Any new allergies since your discharge? No  Dietary orders reviewed? No Do you have support at home? Yes     Follow up appointments reviewed:  PCP Hospital f/u appt confirmed? No  Scheduled to see  on  @ . Specialist Hospital f/u appt confirmed? No  Scheduled to see  on  @ . Are transportation arrangements needed? No  If their condition worsens, is the pt aware to call PCP or go to the Emergency Dept.? Yes Was the patient provided with contact information for the PCP's office or ED? Yes Was to pt encouraged to call back with questions or concerns? Yes

## 2023-05-09 ENCOUNTER — Ambulatory Visit (INDEPENDENT_AMBULATORY_CARE_PROVIDER_SITE_OTHER): Payer: Medicare Other | Admitting: Family Medicine

## 2023-05-09 ENCOUNTER — Encounter: Payer: Self-pay | Admitting: Family Medicine

## 2023-05-09 VITALS — BP 117/69 | HR 52 | Temp 97.0°F | Ht 68.0 in | Wt 190.6 lb

## 2023-05-09 DIAGNOSIS — N401 Enlarged prostate with lower urinary tract symptoms: Secondary | ICD-10-CM | POA: Diagnosis not present

## 2023-05-09 DIAGNOSIS — I10 Essential (primary) hypertension: Secondary | ICD-10-CM

## 2023-05-09 DIAGNOSIS — E782 Mixed hyperlipidemia: Secondary | ICD-10-CM

## 2023-05-09 DIAGNOSIS — R351 Nocturia: Secondary | ICD-10-CM

## 2023-05-09 DIAGNOSIS — Z23 Encounter for immunization: Secondary | ICD-10-CM | POA: Diagnosis not present

## 2023-05-09 DIAGNOSIS — J0100 Acute maxillary sinusitis, unspecified: Secondary | ICD-10-CM

## 2023-05-09 MED ORDER — FINASTERIDE 5 MG PO TABS
5.0000 mg | ORAL_TABLET | Freq: Every day | ORAL | 3 refills | Status: DC
Start: 2023-05-09 — End: 2023-11-06

## 2023-05-09 MED ORDER — FEXOFENADINE HCL 180 MG PO TABS: 180.0000 mg | ORAL_TABLET | Freq: Every day | ORAL | 3 refills | Status: DC

## 2023-05-09 MED ORDER — SULFAMETHOXAZOLE-TRIMETHOPRIM 800-160 MG PO TABS: 1.0000 | ORAL_TABLET | Freq: Two times a day (BID) | ORAL | 0 refills | Status: DC

## 2023-05-09 MED ORDER — TAMSULOSIN HCL 0.4 MG PO CAPS
ORAL_CAPSULE | ORAL | 3 refills | Status: DC
Start: 2023-05-09 — End: 2024-05-13

## 2023-05-09 NOTE — Progress Notes (Signed)
Subjective:  Patient ID: Taylor Cross, male    DOB: 19-Oct-1945  Age: 77 y.o. MRN: [id number]  CC: Medical Management of Chronic Issues   HPI Taylor Cross presents for Symptoms include congestion, facial pain, nasal congestion, non productive cough, post nasal drip and sinus pressure. There is no fever, chills, or sweats. Onset of symptoms was several weeks ago, gradually worsening since that time.   Patient in for follow-up of GERD. Currently asymptomatic taking  PPI daily. There is no chest pain or heartburn. No hematemesis and no melena. No dysphagia or choking. Onset is remote. Progression is stable. Complicating factors, none.   in for follow-up of elevated cholesterol. Doing well without complaints on current medication. Denies side effects of statin including myalgia and arthralgia and nausea. Currently no chest pain, shortness of breath or other cardiovascular related symptoms noted.  Urine flow is normal now that he is taking proscar and flomax      05/09/2023   10:16 AM 11/07/2022    1:49 PM 09/12/2022    2:49 PM  Depression screen PHQ 2/9  Decreased Interest 0 0 0  Down, Depressed, Hopeless 0 0 0  PHQ - 2 Score 0 0 0    History Taylor Cross has a past medical history of Acute respiratory failure with hypoxia and hypercapnia (HCC) (07/19/2020), Acute systolic CHF (congestive heart failure) (HCC) (07/19/2020), Combined systolic and diastolic heart failure (HCC), and Hyperlipidemia.   He has a past surgical history that includes Tonsillectomy and Hernia repair.   His family history includes COPD in his father; Diabetes in his mother; Heart attack (age of onset: 62) in his brother; Heart disease in his mother; Heart failure in his mother.He reports that he has never smoked. He has never used smokeless tobacco. He reports that he does not drink alcohol and does not use drugs.    ROS Review of Systems  Objective:  BP 117/69   Pulse (!) 52   Temp (!) 97 F (36.1 C)   Ht 5\' 8"   (1.727 m)   Wt 190 lb 9.6 oz (86.5 kg)   SpO2 94%   BMI 28.98 kg/m   BP Readings from Last 3 Encounters:  05/09/23 117/69  03/30/23 (!) 116/57  01/03/23 114/65    Wt Readings from Last 3 Encounters:  05/09/23 190 lb 9.6 oz (86.5 kg)  03/30/23 190 lb (86.2 kg)  01/03/23 189 lb (85.7 kg)     Physical Exam Constitutional:      Appearance: He is well-developed.  HENT:     Head: Normocephalic and atraumatic.     Right Ear: Tympanic membrane and external ear normal. No decreased hearing noted.     Left Ear: Tympanic membrane and external ear normal. No decreased hearing noted.     Nose: Mucosal edema and congestion present.     Right Sinus: No frontal sinus tenderness.     Left Sinus: No frontal sinus tenderness.     Comments: Max sinus tenderness for percussion bilat.      Mouth/Throat:     Pharynx: No oropharyngeal exudate or posterior oropharyngeal erythema.  Neck:     Meningeal: Brudzinski's sign absent.  Pulmonary:     Effort: No respiratory distress.     Breath sounds: Normal breath sounds.  Lymphadenopathy:     Head:     Right side of head: No preauricular adenopathy.     Left side of head: No preauricular adenopathy.     Cervical:  Right cervical: No superficial cervical adenopathy.    Left cervical: No superficial cervical adenopathy.       Assessment & Plan:   Taylor Cross was seen today for medical management of chronic issues.  Diagnoses and all orders for this visit:  Essential hypertension -     CBC with Differential/Platelet -     CMP14+EGFR  Mixed hyperlipidemia -     Lipid panel  Benign prostatic hyperplasia with nocturia -     finasteride (PROSCAR) 5 MG tablet; Take 1 tablet (5 mg total) by mouth daily. -     PSA, total and free -     tamsulosin (FLOMAX) 0.4 MG CAPS capsule; TAKE (2) CAPSULES DAILY.  Need for influenza vaccination -     Flu Vaccine Trivalent High Dose (Fluad)  Acute maxillary sinusitis, recurrence not specified  Other  orders -     fexofenadine (ALLEGRA) 180 MG tablet; Take 1 tablet (180 mg total) by mouth daily. For allergy symptoms -     sulfamethoxazole-trimethoprim (BACTRIM DS) 800-160 MG tablet; Take 1 tablet by mouth 2 (two) times daily. Until gone, for infection       I have discontinued Tyresse D. Neukam's loratadine. I am also having him start on fexofenadine and sulfamethoxazole-trimethoprim. Additionally, I am having him maintain his hydroquinone, aspirin, acetaminophen, allopurinol, carvedilol, gabapentin, pantoprazole, pravastatin, sertraline, fluticasone, isosorbide mononitrate, mupirocin ointment, furosemide, hydrALAZINE, finasteride, and tamsulosin.  Allergies as of 05/09/2023       Reactions   Neosporin [neomycin-bacitracin Zn-polymyx] Rash   Voltaren [diclofenac Sodium] Other (See Comments)   GI upset        Medication List        Accurate as of May 09, 2023 11:22 AM. If you have any questions, ask your nurse or doctor.          STOP taking these medications    loratadine 10 MG tablet Commonly known as: CLARITIN Stopped by: Amarilys Lyles       TAKE these medications    acetaminophen 325 MG tablet Commonly known as: TYLENOL Take 2 tablets (650 mg total) by mouth every 6 (six) hours as needed for mild pain, fever or headache (or Fever >/= 101).   allopurinol 100 MG tablet Commonly known as: ZYLOPRIM Take 1 tablet (100 mg total) by mouth daily.   aspirin 81 MG chewable tablet Chew 1 tablet (81 mg total) by mouth daily with breakfast.   carvedilol 12.5 MG tablet Commonly known as: COREG Take 1 tablet (12.5 mg total) by mouth 2 (two) times daily.   fexofenadine 180 MG tablet Commonly known as: ALLEGRA Take 1 tablet (180 mg total) by mouth daily. For allergy symptoms Started by: Adarian Bur   finasteride 5 MG tablet Commonly known as: PROSCAR Take 1 tablet (5 mg total) by mouth daily.   fluticasone 50 MCG/ACT nasal spray Commonly known as:  FLONASE USE 2 SPRAYS IN EACH NOSTRIL ONCE DAILY   furosemide 40 MG tablet Commonly known as: LASIX TAKE 1/2 TABLET DAILY   gabapentin 300 MG capsule Commonly known as: NEURONTIN Take 1 capsule (300 mg total) by mouth 2 (two) times daily.   hydrALAZINE 25 MG tablet Commonly known as: APRESOLINE TAKE 1/2 TABLET BY MOUTH TWICE DAILY   hydroquinone 4 % cream APPLY TO AFFECTED AREAS TWICE A DAY   isosorbide mononitrate 30 MG 24 hr tablet Commonly known as: IMDUR TAKE 1/2 TABLET BY MOUTH DAILY   mupirocin ointment 2 % Commonly known as: BACTROBAN Apply 1 Application  topically 2 (two) times daily.   pantoprazole 40 MG tablet Commonly known as: PROTONIX Take 1 tablet (40 mg total) by mouth daily. TAKE 1 TABLET ONCE DAILY FOR REFLUX   pravastatin 40 MG tablet Commonly known as: PRAVACHOL Take 1 tablet (40 mg total) by mouth daily.   sertraline 50 MG tablet Commonly known as: ZOLOFT Take 1 tablet (50 mg total) by mouth daily.   sulfamethoxazole-trimethoprim 800-160 MG tablet Commonly known as: BACTRIM DS Take 1 tablet by mouth 2 (two) times daily. Until gone, for infection Started by: Exodus Kutzer   tamsulosin 0.4 MG Caps capsule Commonly known as: FLOMAX TAKE (2) CAPSULES DAILY.         Follow-up: Return in about 6 months (around 11/07/2023).  [name], M.D.

## 2023-05-10 LAB — CBC WITH DIFFERENTIAL/PLATELET
Basophils Absolute: 0 10*3/uL (ref 0.0–0.2)
Basos: 0 %
EOS (ABSOLUTE): 0.2 10*3/uL (ref 0.0–0.4)
Eos: 3 %
Hematocrit: 40.1 % (ref 37.5–51.0)
Hemoglobin: 13 g/dL (ref 13.0–17.7)
Immature Grans (Abs): 0.1 10*3/uL (ref 0.0–0.1)
Immature Granulocytes: 1 %
Lymphocytes Absolute: 1.1 10*3/uL (ref 0.7–3.1)
Lymphs: 21 %
MCH: 33.9 pg — ABNORMAL HIGH (ref 26.6–33.0)
MCHC: 32.4 g/dL (ref 31.5–35.7)
MCV: 104 fL — ABNORMAL HIGH (ref 79–97)
Monocytes Absolute: 0.7 10*3/uL (ref 0.1–0.9)
Monocytes: 13 %
Neutrophils Absolute: 3.2 10*3/uL (ref 1.4–7.0)
Neutrophils: 62 %
Platelets: 173 10*3/uL (ref 150–450)
RBC: 3.84 x10E6/uL — ABNORMAL LOW (ref 4.14–5.80)
RDW: 13 % (ref 11.6–15.4)
WBC: 5.1 10*3/uL (ref 3.4–10.8)

## 2023-05-10 LAB — LIPID PANEL
Chol/HDL Ratio: 2.9 {ratio} (ref 0.0–5.0)
Cholesterol, Total: 149 mg/dL (ref 100–199)
HDL: 52 mg/dL (ref >39)
LDL Chol Calc (NIH): 78 mg/dL (ref 0–99)
Triglycerides: 102 mg/dL (ref 0–149)
VLDL Cholesterol Cal: 19 mg/dL (ref 5–40)

## 2023-05-10 LAB — CMP14+EGFR
ALT: 12 [IU]/L (ref 0–44)
AST: 13 [IU]/L (ref 0–40)
Albumin: 4.1 g/dL (ref 3.8–4.8)
Alkaline Phosphatase: 96 [IU]/L (ref 44–121)
BUN/Creatinine Ratio: 12 (ref 10–24)
BUN: 17 mg/dL (ref 8–27)
Bilirubin Total: 1.1 mg/dL (ref 0.0–1.2)
CO2: 21 mmol/L (ref 20–29)
Calcium: 9.1 mg/dL (ref 8.6–10.2)
Chloride: 94 mmol/L — ABNORMAL LOW (ref 96–106)
Creatinine, Ser: 1.45 mg/dL — ABNORMAL HIGH (ref 0.76–1.27)
Globulin, Total: 2.6 g/dL (ref 1.5–4.5)
Glucose: 95 mg/dL (ref 70–99)
Potassium: 4 mmol/L (ref 3.5–5.2)
Sodium: 133 mmol/L — ABNORMAL LOW (ref 134–144)
Total Protein: 6.7 g/dL (ref 6.0–8.5)
eGFR: 50 mL/min/{1.73_m2} — ABNORMAL LOW (ref >59)

## 2023-05-10 LAB — PSA, TOTAL AND FREE
PSA, Free Pct: 22 %
PSA, Free: 0.11 ng/mL
Prostate Specific Ag, Serum: 0.5 ng/mL (ref 0.0–4.0)

## 2023-05-16 ENCOUNTER — Encounter (HOSPITAL_COMMUNITY): Payer: Self-pay

## 2023-05-16 ENCOUNTER — Emergency Department (HOSPITAL_COMMUNITY)
Admission: EM | Admit: 2023-05-16 | Discharge: 2023-05-16 | Disposition: A | Discharge status: Home / Self Care | Payer: Medicare Other | Attending: Emergency Medicine | Admitting: Emergency Medicine

## 2023-05-16 ENCOUNTER — Other Ambulatory Visit: Payer: Self-pay

## 2023-05-16 DIAGNOSIS — S61213A Laceration without foreign body of left middle finger without damage to nail, initial encounter: Secondary | ICD-10-CM | POA: Insufficient documentation

## 2023-05-16 DIAGNOSIS — Z7982 Long term (current) use of aspirin: Secondary | ICD-10-CM | POA: Insufficient documentation

## 2023-05-16 DIAGNOSIS — W275XXA Contact with paper-cutter, initial encounter: Secondary | ICD-10-CM | POA: Diagnosis not present

## 2023-05-16 MED ORDER — BACITRACIN ZINC 500 UNIT/GM EX OINT
TOPICAL_OINTMENT | Freq: Two times a day (BID) | CUTANEOUS | Status: DC
  Filled 2023-05-16: qty 1.8

## 2023-05-16 MED ORDER — MUPIROCIN 2 % EX OINT: 1.0000 | TOPICAL_OINTMENT | Freq: Two times a day (BID) | CUTANEOUS | 0 refills | Status: DC

## 2023-05-16 NOTE — Discharge Instructions (Signed)
Keep a topical antibiotic ointment on your wound, sterile dressings, see your doctor in 1 week for recheck, this will heal over the next 2 weeks

## 2023-05-16 NOTE — ED Provider Notes (Signed)
[place] EMERGENCY DEPARTMENT AT [facility] Provider Note   CSN: [id number] Arrival date & time: 05/16/23  2028     History  Chief Complaint  Patient presents with   finger laceration    Taylor Cross is a 77 y.o. male.  HPI   This patient is a 77 year old male, he presents to the hospital with an injury to his left middle finger.  He was using a box cutter when it accidentally cut the tip of his left middle finger.  This occurred just prior to arrival, he was bleeding quite heavily but after applying pressure in a couple of hours of observation it is completely stopped.  On my exam the patient is not actively bleeding.  He is right-hand-dominant and is up-to-date on tetanus  Home Medications Prior to Admission medications   Medication Sig Start Date End Date Taking? Authorizing Provider  mupirocin ointment (BACTROBAN) 2 % Apply 1 Application topically 2 (two) times daily. 05/16/23  Yes Eber Hong, MD  acetaminophen (TYLENOL) 325 MG tablet Take 2 tablets (650 mg total) by mouth every 6 (six) hours as needed for mild pain, fever or headache (or Fever >/= 101). 07/22/20   Shon Hale, MD  allopurinol (ZYLOPRIM) 100 MG tablet Take 1 tablet (100 mg total) by mouth daily. 11/07/22   [name], MD  aspirin 81 MG chewable tablet Chew 1 tablet (81 mg total) by mouth daily with breakfast. 07/22/20   Shon Hale, MD  carvedilol (COREG) 12.5 MG tablet Take 1 tablet (12.5 mg total) by mouth 2 (two) times daily. 11/07/22   [name], MD  fexofenadine (ALLEGRA) 180 MG tablet Take 1 tablet (180 mg total) by mouth daily. For allergy symptoms 05/09/23   [name], MD  finasteride (PROSCAR) 5 MG tablet Take 1 tablet (5 mg total) by mouth daily. 05/09/23   [name], MD  fluticasone [facility]) 50 MCG/ACT nasal spray USE 2 SPRAYS IN EACH NOSTRIL ONCE DAILY 12/03/22   [name], MD  furosemide (LASIX) 40 MG tablet TAKE 1/2 TABLET DAILY 04/02/23   Antoine Poche, MD  gabapentin (NEURONTIN) 300 MG capsule Take 1 capsule (300 mg total) by mouth 2 (two) times daily. 11/07/22   [name], MD  hydrALAZINE (APRESOLINE) 25 MG tablet TAKE 1/2 TABLET BY MOUTH TWICE DAILY 04/08/23   Antoine Poche, MD  hydroquinone 4 % cream APPLY TO AFFECTED AREAS TWICE A DAY 09/09/18   [name], MD  isosorbide mononitrate (IMDUR) 30 MG 24 hr tablet TAKE 1/2 TABLET BY MOUTH DAILY 03/11/23   Antoine Poche, MD  pantoprazole (PROTONIX) 40 MG tablet Take 1 tablet (40 mg total) by mouth daily. TAKE 1 TABLET ONCE DAILY FOR REFLUX 11/07/22   [name], MD  pravastatin (PRAVACHOL) 40 MG tablet Take 1 tablet (40 mg total) by mouth daily. 11/07/22   [name], MD  sertraline (ZOLOFT) 50 MG tablet Take 1 tablet (50 mg total) by mouth daily. 11/07/22   [name], MD  sulfamethoxazole-trimethoprim (BACTRIM DS) 800-160 MG tablet Take 1 tablet by mouth 2 (two) times daily. Until gone, for infection 05/09/23   [name], MD  tamsulosin (FLOMAX) 0.4 MG CAPS capsule TAKE (2) CAPSULES DAILY. 05/09/23   [name], MD      Allergies    Neosporin [neomycin-bacitracin zn-polymyx] and Voltaren [diclofenac sodium]    Review of Systems   Review of Systems  All other systems reviewed and are negative.   Physical Exam Updated Vital Signs  BP 114/72 (BP Location: Right Arm)   Pulse (!) 54   Resp 17   Ht 1.727 m (5\' 8" )   Wt 86.2 kg   SpO2 93%   BMI 28.89 kg/m  Physical Exam Vitals and nursing note reviewed.  Constitutional:      General: He is not in acute distress.    Appearance: He is well-developed.  HENT:     Head: Normocephalic and atraumatic.     Mouth/Throat:     Pharynx: No oropharyngeal exudate.  Eyes:     General: No scleral icterus.       Right eye: No discharge.        Left eye: No discharge.     Conjunctiva/sclera: Conjunctivae normal.     Pupils: Pupils are equal, round, and reactive to light.  Neck:     Thyroid: No  thyromegaly.     Vascular: No JVD.  Cardiovascular:     Rate and Rhythm: Normal rate and regular rhythm.     Heart sounds: Normal heart sounds. No murmur heard.    No friction rub. No gallop.  Pulmonary:     Effort: Pulmonary effort is normal. No respiratory distress.     Breath sounds: Normal breath sounds. No wheezing or rales.  Abdominal:     General: Bowel sounds are normal. There is no distension.     Palpations: Abdomen is soft. There is no mass.     Tenderness: There is no abdominal tenderness.  Musculoskeletal:        General: No tenderness. Normal range of motion.     Cervical back: Normal range of motion and neck supple.     Comments: Totally normal range of motion of his left middle finger without any difficulty, totally normal flexor and extensor tendon function, nailbed is intact, no nail injury  Lymphadenopathy:     Cervical: No cervical adenopathy.  Skin:    General: Skin is warm and dry.     Findings: No erythema or rash.     Comments: Completely missing tip of that left third finger, approximately 1 mm deep, there is no skin flap present, there is no bone exposed, this is very superficial  Neurological:     Mental Status: He is alert.     Coordination: Coordination normal.  Psychiatric:        Behavior: Behavior normal.     ED Results / Procedures / Treatments   Labs (all labs ordered are listed, but only abnormal results are displayed) Labs Reviewed - No data to display  EKG None  Radiology No results found.  Procedures Procedures    Medications Ordered in ED Medications  bacitracin ointment (has no administration in time range)    ED Course/ Medical Decision Making/ A&P                                 Medical Decision Making  Not actively bleeding, blood pressure normal, heart rate normal, not anticoagulated, well-appearing, wound care given, irrigated copiously and placed on bacitracin and a sterile dressing, patient stable for discharge,  no bleeding        Final Clinical Impression(s) / ED Diagnoses Final diagnoses:  Laceration of left middle finger without foreign body without damage to nail, initial encounter    Rx / DC Orders ED Discharge Orders          Ordered    mupirocin ointment (BACTROBAN) 2 %  2 times daily        05/16/23 2209              Eber Hong, MD 05/16/23 2209

## 2023-05-16 NOTE — ED Triage Notes (Signed)
Pt lacerated left middle finger with a box cutter.

## 2023-05-22 LAB — SPECIMEN STATUS REPORT

## 2023-05-24 LAB — VITAMIN B12: Vitamin B-12: 519 pg/mL (ref 232–1245)

## 2023-05-24 LAB — FOLATE: Folate: 11.1 ng/mL (ref >3.0)

## 2023-05-24 LAB — SPECIMEN STATUS REPORT

## 2023-05-26 NOTE — Progress Notes (Signed)
Hello Miraj,  Your lab result is normal and/or stable.Some minor variations that are not significant are commonly marked abnormal, but do not represent any medical problem for you.  Best regards, Minta Fair, M.D.

## 2023-06-04 ENCOUNTER — Telehealth: Payer: Self-pay | Admitting: Cardiology

## 2023-06-04 NOTE — Telephone Encounter (Signed)
Pt c/o medication issue:  1. Name of Medication: furosemide (LASIX) 40 MG tablet   2. How are you currently taking this medication (dosage and times per day)? 1/2 tablet  3. Are you having a reaction (difficulty breathing--STAT)?   4. What is your medication issue? Patient states he is about to run out of this medication.  He states he has been taking a full tablet lately instead of just 1/2 a tablet, and when he tired to refill it the pharmacy told him he couldn't refill it till the 19th of this month.  He said he will run out before then.

## 2023-06-07 NOTE — Telephone Encounter (Signed)
If full tablet is working well for him then that's fine to chagne prescription  Dominga Ferry MD

## 2023-06-10 MED ORDER — FUROSEMIDE 40 MG PO TABS: 40.0000 mg | ORAL_TABLET | Freq: Every day | ORAL | 3 refills | Status: DC

## 2023-06-10 NOTE — Telephone Encounter (Signed)
Dose increase made new rx sent to pharmacy

## 2023-07-08 ENCOUNTER — Ambulatory Visit: Payer: Medicare Other | Admitting: Cardiology

## 2023-07-22 ENCOUNTER — Other Ambulatory Visit: Payer: Self-pay | Admitting: Family Medicine

## 2023-08-07 ENCOUNTER — Other Ambulatory Visit: Payer: Self-pay

## 2023-08-07 ENCOUNTER — Encounter (HOSPITAL_COMMUNITY): Payer: Self-pay

## 2023-08-07 ENCOUNTER — Emergency Department (HOSPITAL_COMMUNITY): Payer: Medicare Other

## 2023-08-07 ENCOUNTER — Inpatient Hospital Stay (HOSPITAL_COMMUNITY)
Admission: EM | Admit: 2023-08-07 | Discharge: 2023-08-09 | DRG: 291 | Disposition: A | Discharge status: Home / Self Care | Payer: Medicare Other | Attending: Internal Medicine | Admitting: Internal Medicine

## 2023-08-07 DIAGNOSIS — R9431 Abnormal electrocardiogram [ECG] [EKG]: Secondary | ICD-10-CM | POA: Insufficient documentation

## 2023-08-07 DIAGNOSIS — Z8249 Family history of ischemic heart disease and other diseases of the circulatory system: Secondary | ICD-10-CM

## 2023-08-07 DIAGNOSIS — Z833 Family history of diabetes mellitus: Secondary | ICD-10-CM

## 2023-08-07 DIAGNOSIS — Z7982 Long term (current) use of aspirin: Secondary | ICD-10-CM

## 2023-08-07 DIAGNOSIS — N400 Enlarged prostate without lower urinary tract symptoms: Secondary | ICD-10-CM | POA: Diagnosis present

## 2023-08-07 DIAGNOSIS — I5043 Acute on chronic combined systolic (congestive) and diastolic (congestive) heart failure: Secondary | ICD-10-CM | POA: Diagnosis present

## 2023-08-07 DIAGNOSIS — J9811 Atelectasis: Secondary | ICD-10-CM | POA: Diagnosis present

## 2023-08-07 DIAGNOSIS — K2100 Gastro-esophageal reflux disease with esophagitis, without bleeding: Secondary | ICD-10-CM | POA: Diagnosis present

## 2023-08-07 DIAGNOSIS — Z1152 Encounter for screening for COVID-19: Secondary | ICD-10-CM

## 2023-08-07 DIAGNOSIS — I110 Hypertensive heart disease with heart failure: Principal | ICD-10-CM | POA: Diagnosis present

## 2023-08-07 DIAGNOSIS — R7989 Other specified abnormal findings of blood chemistry: Secondary | ICD-10-CM | POA: Insufficient documentation

## 2023-08-07 DIAGNOSIS — Z79899 Other long term (current) drug therapy: Secondary | ICD-10-CM

## 2023-08-07 DIAGNOSIS — I480 Paroxysmal atrial fibrillation: Secondary | ICD-10-CM | POA: Insufficient documentation

## 2023-08-07 DIAGNOSIS — Z825 Family history of asthma and other chronic lower respiratory diseases: Secondary | ICD-10-CM

## 2023-08-07 DIAGNOSIS — J449 Chronic obstructive pulmonary disease, unspecified: Secondary | ICD-10-CM | POA: Diagnosis present

## 2023-08-07 DIAGNOSIS — I5023 Acute on chronic systolic (congestive) heart failure: Secondary | ICD-10-CM | POA: Diagnosis not present

## 2023-08-07 DIAGNOSIS — J9601 Acute respiratory failure with hypoxia: Secondary | ICD-10-CM | POA: Diagnosis present

## 2023-08-07 DIAGNOSIS — I509 Heart failure, unspecified: Principal | ICD-10-CM

## 2023-08-07 DIAGNOSIS — R1310 Dysphagia, unspecified: Secondary | ICD-10-CM | POA: Diagnosis present

## 2023-08-07 DIAGNOSIS — E785 Hyperlipidemia, unspecified: Secondary | ICD-10-CM | POA: Diagnosis present

## 2023-08-07 DIAGNOSIS — J439 Emphysema, unspecified: Secondary | ICD-10-CM | POA: Diagnosis present

## 2023-08-07 DIAGNOSIS — Z888 Allergy status to other drugs, medicaments and biological substances status: Secondary | ICD-10-CM

## 2023-08-07 DIAGNOSIS — M7989 Other specified soft tissue disorders: Secondary | ICD-10-CM | POA: Diagnosis present

## 2023-08-07 HISTORY — DX: Unspecified osteoarthritis, unspecified site: M19.90

## 2023-08-07 HISTORY — DX: Gastro-esophageal reflux disease without esophagitis: K21.9

## 2023-08-07 HISTORY — DX: Pneumonia, unspecified organism: J18.9

## 2023-08-07 LAB — RESP PANEL BY RT-PCR (RSV, FLU A&B, COVID)  RVPGX2
Influenza A by PCR: NEGATIVE
Influenza B by PCR: NEGATIVE
Resp Syncytial Virus by PCR: NEGATIVE
SARS Coronavirus 2 by RT PCR: NEGATIVE

## 2023-08-07 MED ORDER — ALBUTEROL SULFATE HFA 108 (90 BASE) MCG/ACT IN AERS: 2.0000 | INHALATION_SPRAY | RESPIRATORY_TRACT | Status: DC | PRN

## 2023-08-07 NOTE — ED Triage Notes (Signed)
 Pt c/o having difficulty swallowing and SOB for a while now but feels it has gotten worse of the last few days. Feet swelling has been going on for the last couple of weeks.

## 2023-08-07 NOTE — ED Notes (Signed)
 Patient denied needing wheelchair.  was 85% after ambulating to tx room.  Upon rest patient was 88% on room air Placed on 2l Victor.  Now 94%.   Primary RN notified

## 2023-08-07 NOTE — ED Provider Notes (Signed)
 [place] EMERGENCY DEPARTMENT AT [facility] Provider Note   CSN: [id number] Arrival date & time: 08/07/23  1740     History  Chief Complaint  Patient presents with   Shortness of Breath   Dysphagia        Foot Swelling    Taylor Cross is a 79 y.o. male.  Patient is a 78 year old male with past medical history of congestive heart failure, hypertension, hyperlipidemia, emphysema.  Patient presenting today for evaluation of shortness of breath.  This has been worsening over the past several days.  He describes dyspnea on exertion and orthopnea.  No fevers or chills or cough.  He does report increased leg swelling as well.  He is on Lasix  and reports being compliant with this medication.  The history is provided by the patient.       Home Medications Prior to Admission medications   Medication Sig Start Date End Date Taking? Authorizing Provider  acetaminophen  (TYLENOL ) 325 MG tablet Take 2 tablets (650 mg total) by mouth every 6 (six) hours as needed for mild pain, fever or headache (or Fever >/= 101). 07/22/20   Pearlean Manus, MD  allopurinol  (ZYLOPRIM ) 100 MG tablet Take 1 tablet (100 mg total) by mouth daily. 11/07/22   Zollie Lowers, MD  aspirin  81 MG chewable tablet Chew 1 tablet (81 mg total) by mouth daily with breakfast. 07/22/20   Pearlean Manus, MD  carvedilol  (COREG ) 12.5 MG tablet Take 1 tablet (12.5 mg total) by mouth 2 (two) times daily. 11/07/22   Zollie Lowers, MD  fexofenadine  (ALLEGRA ) 180 MG tablet Take 1 tablet (180 mg total) by mouth daily. For allergy symptoms 05/09/23   Zollie Lowers, MD  finasteride  (PROSCAR ) 5 MG tablet Take 1 tablet (5 mg total) by mouth daily. 05/09/23   Zollie Lowers, MD  fluticasone  (FLONASE ) 50 MCG/ACT nasal spray USE 2 SPRAYS IN EACH NOSTRIL ONCE DAILY 07/25/23   Zollie Lowers, MD  furosemide  (LASIX ) 40 MG tablet Take 1 tablet (40 mg total) by mouth daily. 06/10/23   Alvan Dorn FALCON, MD  gabapentin  (NEURONTIN )  300 MG capsule Take 1 capsule (300 mg total) by mouth 2 (two) times daily. 11/07/22   Zollie Lowers, MD  hydrALAZINE  (APRESOLINE ) 25 MG tablet TAKE 1/2 TABLET BY MOUTH TWICE DAILY 04/08/23   Alvan Dorn FALCON, MD  hydroquinone  4 % cream APPLY TO AFFECTED AREAS TWICE A DAY 09/09/18   Zollie Lowers, MD  isosorbide  mononitrate (IMDUR ) 30 MG 24 hr tablet TAKE 1/2 TABLET BY MOUTH DAILY 03/11/23   Alvan Dorn FALCON, MD  mupirocin  ointment (BACTROBAN ) 2 % Apply 1 Application topically 2 (two) times daily. 05/16/23   Cleotilde Rogue, MD  pantoprazole  (PROTONIX ) 40 MG tablet Take 1 tablet (40 mg total) by mouth daily. TAKE 1 TABLET ONCE DAILY FOR REFLUX 11/07/22   Zollie Lowers, MD  pravastatin  (PRAVACHOL ) 40 MG tablet Take 1 tablet (40 mg total) by mouth daily. 11/07/22   Zollie Lowers, MD  sertraline  (ZOLOFT ) 50 MG tablet Take 1 tablet (50 mg total) by mouth daily. 11/07/22   Zollie Lowers, MD  sulfamethoxazole -trimethoprim  (BACTRIM  DS) 800-160 MG tablet Take 1 tablet by mouth 2 (two) times daily. Until gone, for infection 05/09/23   Zollie Lowers, MD  tamsulosin  (FLOMAX ) 0.4 MG CAPS capsule TAKE (2) CAPSULES DAILY. 05/09/23   Zollie Lowers, MD      Allergies    Neosporin [neomycin-bacitracin  zn-polymyx] and Voltaren  [diclofenac  sodium]    Review of Systems   Review  of Systems  All other systems reviewed and are negative.   Physical Exam Updated Vital Signs BP (!) 130/92   Pulse 70   Temp 98.2 F (36.8 C) (Oral)   Resp 17   Ht 5' 8 (1.727 m)   Wt 83.5 kg   SpO2 97%   BMI 27.98 kg/m  Physical Exam Vitals and nursing note reviewed.  Constitutional:      General: He is not in acute distress.    Appearance: He is well-developed. He is not diaphoretic.  HENT:     Head: Normocephalic and atraumatic.  Cardiovascular:     Rate and Rhythm: Normal rate and regular rhythm.     Heart sounds: No murmur heard.    No friction rub.  Pulmonary:     Effort: Pulmonary effort is normal. No  respiratory distress.     Breath sounds: Normal breath sounds. No wheezing or rales.  Abdominal:     General: Bowel sounds are normal. There is no distension.     Palpations: Abdomen is soft.     Tenderness: There is no abdominal tenderness.  Musculoskeletal:        General: Normal range of motion.     Cervical back: Normal range of motion and neck supple.     Right lower leg: Edema present.     Left lower leg: Edema present.     Comments: There is 2+ pitting edema both lower extremities.  Skin:    General: Skin is warm and dry.  Neurological:     Mental Status: He is alert and oriented to person, place, and time.     Coordination: Coordination normal.     ED Results / Procedures / Treatments   Labs (all labs ordered are listed, but only abnormal results are displayed) Labs Reviewed  RESP PANEL BY RT-PCR (RSV, FLU A&B, COVID)  [id number]  BASIC METABOLIC PANEL  CBC WITH DIFFERENTIAL/PLATELET  BRAIN NATRIURETIC PEPTIDE  TROPONIN I (HIGH SENSITIVITY)    EKG EKG Interpretation Date/Time:  Wednesday August 07 2023 22:32:09 EST Ventricular Rate:  77 PR Interval:    QRS Duration:  123 QT Interval:  467 QTC Calculation: 501 R Axis:   111  Text Interpretation: Atrial fibrillation Paired ventricular premature complexes Nonspecific intraventricular conduction delay Anterolateral infarct, old No significant change since 04/09/2021 Confirmed by Geroldine Berg ([id number]) on 08/07/2023 11:47:23 PM  Radiology DG Chest 2 View Result Date: 08/07/2023 CLINICAL DATA:  Shortness of breath EXAM: CHEST - 2 VIEW COMPARISON:  04/09/2021 FINDINGS: Mild cardiomegaly. Mediastinal contours within normal limits. Aortic atherosclerosis. Small left pleural effusion with left lower lobe atelectasis or infiltrate. No confluent opacity on the right. No acute bony abnormality. IMPRESSION: Left lower lobe atelectasis or infiltrate with small left effusion. Mild cardiomegaly. Electronically Signed   By: Franky Crease  M.D.   On: 08/07/2023 23:10    Procedures Procedures    Medications Ordered in ED Medications  albuterol  (VENTOLIN  HFA) 108 (90 Base) MCG/ACT inhaler 2 puff (has no administration in time range)    ED Course/ Medical Decision Making/ A&P  Patient is a 78 year old male with history of CHF presenting with leg swelling and shortness of breath.  This is been ongoing for several weeks, but much worse over the past 3 days.  Patient arrives with stable vital signs and is afebrile.  He is somewhat hypoxic with oxygen saturations in the upper 80s and has a 2 L oxygen requirement to maintain saturations in the 90s.  Workup initiated  including CBC, metabolic panel, troponin, and BNP.  Studies basically unremarkable with the exception of a BNP of 881.  Respiratory panel is negative.  Chest x-ray shows left lower lobe atelectasis versus infiltrate with a small left effusion.  Patient has been given 40 mg of IV Lasix .  I suspect symptoms are related to an exacerbation of CHF.  He describes dyspnea on exertion, orthopnea, and increased leg swelling and BNP is elevated.  As patient has an oxygen requirement, I will speak with the hospitalist regarding admission.  Final Clinical Impression(s) / ED Diagnoses Final diagnoses:  None    Rx / DC Orders ED Discharge Orders     None         Geroldine Berg, MD 08/08/23 [phone]

## 2023-08-08 ENCOUNTER — Encounter (HOSPITAL_COMMUNITY): Payer: Self-pay | Admitting: Internal Medicine

## 2023-08-08 ENCOUNTER — Inpatient Hospital Stay (HOSPITAL_COMMUNITY): Payer: Medicare Other

## 2023-08-08 DIAGNOSIS — R1310 Dysphagia, unspecified: Secondary | ICD-10-CM | POA: Diagnosis present

## 2023-08-08 DIAGNOSIS — J9601 Acute respiratory failure with hypoxia: Secondary | ICD-10-CM | POA: Diagnosis present

## 2023-08-08 DIAGNOSIS — Z888 Allergy status to other drugs, medicaments and biological substances status: Secondary | ICD-10-CM | POA: Diagnosis not present

## 2023-08-08 DIAGNOSIS — N400 Enlarged prostate without lower urinary tract symptoms: Secondary | ICD-10-CM | POA: Diagnosis present

## 2023-08-08 DIAGNOSIS — R7989 Other specified abnormal findings of blood chemistry: Secondary | ICD-10-CM | POA: Insufficient documentation

## 2023-08-08 DIAGNOSIS — E785 Hyperlipidemia, unspecified: Secondary | ICD-10-CM | POA: Diagnosis present

## 2023-08-08 DIAGNOSIS — Z1152 Encounter for screening for COVID-19: Secondary | ICD-10-CM | POA: Diagnosis not present

## 2023-08-08 DIAGNOSIS — I480 Paroxysmal atrial fibrillation: Secondary | ICD-10-CM | POA: Insufficient documentation

## 2023-08-08 DIAGNOSIS — Z79899 Other long term (current) drug therapy: Secondary | ICD-10-CM | POA: Diagnosis not present

## 2023-08-08 DIAGNOSIS — I5043 Acute on chronic combined systolic (congestive) and diastolic (congestive) heart failure: Secondary | ICD-10-CM

## 2023-08-08 DIAGNOSIS — Z8249 Family history of ischemic heart disease and other diseases of the circulatory system: Secondary | ICD-10-CM | POA: Diagnosis not present

## 2023-08-08 DIAGNOSIS — J449 Chronic obstructive pulmonary disease, unspecified: Secondary | ICD-10-CM

## 2023-08-08 DIAGNOSIS — R718 Other abnormality of red blood cells: Secondary | ICD-10-CM | POA: Diagnosis not present

## 2023-08-08 DIAGNOSIS — R9431 Abnormal electrocardiogram [ECG] [EKG]: Secondary | ICD-10-CM | POA: Diagnosis not present

## 2023-08-08 DIAGNOSIS — I110 Hypertensive heart disease with heart failure: Secondary | ICD-10-CM | POA: Diagnosis present

## 2023-08-08 DIAGNOSIS — I5023 Acute on chronic systolic (congestive) heart failure: Secondary | ICD-10-CM | POA: Diagnosis present

## 2023-08-08 DIAGNOSIS — Z833 Family history of diabetes mellitus: Secondary | ICD-10-CM | POA: Diagnosis not present

## 2023-08-08 DIAGNOSIS — J439 Emphysema, unspecified: Secondary | ICD-10-CM | POA: Diagnosis present

## 2023-08-08 DIAGNOSIS — J9811 Atelectasis: Secondary | ICD-10-CM | POA: Diagnosis present

## 2023-08-08 DIAGNOSIS — Z825 Family history of asthma and other chronic lower respiratory diseases: Secondary | ICD-10-CM | POA: Diagnosis not present

## 2023-08-08 DIAGNOSIS — M7989 Other specified soft tissue disorders: Secondary | ICD-10-CM | POA: Diagnosis present

## 2023-08-08 DIAGNOSIS — K2100 Gastro-esophageal reflux disease with esophagitis, without bleeding: Secondary | ICD-10-CM | POA: Diagnosis present

## 2023-08-08 DIAGNOSIS — Z7982 Long term (current) use of aspirin: Secondary | ICD-10-CM | POA: Diagnosis not present

## 2023-08-08 LAB — COMPREHENSIVE METABOLIC PANEL
ALT: 12 U/L (ref 0–44)
AST: 15 U/L (ref 15–41)
Albumin: 3.6 g/dL (ref 3.5–5.0)
Alkaline Phosphatase: 85 U/L (ref 38–126)
Anion gap: 9 (ref 5–15)
BUN: 18 mg/dL (ref 8–23)
CO2: 28 mmol/L (ref 22–32)
Calcium: 9.1 mg/dL (ref 8.9–10.3)
Chloride: 98 mmol/L (ref 98–111)
Creatinine, Ser: 1.45 mg/dL — ABNORMAL HIGH (ref 0.61–1.24)
GFR, Estimated: 50 mL/min — ABNORMAL LOW (ref >60)
Glucose, Bld: 107 mg/dL — ABNORMAL HIGH (ref 70–99)
Potassium: 3.7 mmol/L (ref 3.5–5.1)
Sodium: 135 mmol/L (ref 135–145)
Total Bilirubin: 1.5 mg/dL — ABNORMAL HIGH (ref 0.0–1.2)
Total Protein: 7.2 g/dL (ref 6.5–8.1)

## 2023-08-08 LAB — CBC WITH DIFFERENTIAL/PLATELET
Abs Immature Granulocytes: 0.02 10*3/uL (ref 0.00–0.07)
Basophils Absolute: 0 10*3/uL (ref 0.0–0.1)
Basophils Relative: 0 %
Eosinophils Absolute: 0.3 10*3/uL (ref 0.0–0.5)
Eosinophils Relative: 5 %
HCT: 41.8 % (ref 39.0–52.0)
Hemoglobin: 13.7 g/dL (ref 13.0–17.0)
Immature Granulocytes: 0 %
Lymphocytes Relative: 23 %
Lymphs Abs: 1.4 10*3/uL (ref 0.7–4.0)
MCH: 33.2 pg (ref 26.0–34.0)
MCHC: 32.8 g/dL (ref 30.0–36.0)
MCV: 101.2 fL — ABNORMAL HIGH (ref 80.0–100.0)
Monocytes Absolute: 0.7 10*3/uL (ref 0.1–1.0)
Monocytes Relative: 12 %
Neutro Abs: 3.8 10*3/uL (ref 1.7–7.7)
Neutrophils Relative %: 60 %
Platelets: 188 10*3/uL (ref 150–400)
RBC: 4.13 MIL/uL — ABNORMAL LOW (ref 4.22–5.81)
RDW: 14.7 % (ref 11.5–15.5)
WBC: 6.2 10*3/uL (ref 4.0–10.5)
nRBC: 0 % (ref 0.0–0.2)

## 2023-08-08 LAB — BASIC METABOLIC PANEL
Anion gap: 9 (ref 5–15)
BUN: 19 mg/dL (ref 8–23)
CO2: 26 mmol/L (ref 22–32)
Calcium: 9 mg/dL (ref 8.9–10.3)
Chloride: 98 mmol/L (ref 98–111)
Creatinine, Ser: 1.56 mg/dL — ABNORMAL HIGH (ref 0.61–1.24)
GFR, Estimated: 45 mL/min — ABNORMAL LOW (ref >60)
Glucose, Bld: 104 mg/dL — ABNORMAL HIGH (ref 70–99)
Potassium: 3.9 mmol/L (ref 3.5–5.1)
Sodium: 133 mmol/L — ABNORMAL LOW (ref 135–145)

## 2023-08-08 LAB — CBC
HCT: 39.3 % (ref 39.0–52.0)
Hemoglobin: 13.2 g/dL (ref 13.0–17.0)
MCH: 34.3 pg — ABNORMAL HIGH (ref 26.0–34.0)
MCHC: 33.6 g/dL (ref 30.0–36.0)
MCV: 102.1 fL — ABNORMAL HIGH (ref 80.0–100.0)
Platelets: 188 10*3/uL (ref 150–400)
RBC: 3.85 MIL/uL — ABNORMAL LOW (ref 4.22–5.81)
RDW: 14.8 % (ref 11.5–15.5)
WBC: 5.7 10*3/uL (ref 4.0–10.5)
nRBC: 0 % (ref 0.0–0.2)

## 2023-08-08 LAB — ECHOCARDIOGRAM COMPLETE
AR max vel: 1.45 cm2
AV Area VTI: 1.62 cm2
AV Area mean vel: 1.37 cm2
AV Mean grad: 10 mm[Hg]
AV Peak grad: 15.8 mm[Hg]
Ao pk vel: 1.99 m/s
Area-P 1/2: 3.37 cm2
Height: 68 in
S' Lateral: 4.4 cm
Weight: 2952.4 [oz_av]

## 2023-08-08 LAB — FOLATE: Folate: 17.4 ng/mL (ref >5.9)

## 2023-08-08 LAB — MAGNESIUM: Magnesium: 2.1 mg/dL (ref 1.7–2.4)

## 2023-08-08 LAB — TROPONIN I (HIGH SENSITIVITY)
Troponin I (High Sensitivity): 17 ng/L (ref <18)
Troponin I (High Sensitivity): 17 ng/L (ref <18)

## 2023-08-08 LAB — VITAMIN B12: Vitamin B-12: 432 pg/mL (ref 180–914)

## 2023-08-08 LAB — PHOSPHORUS: Phosphorus: 4.1 mg/dL (ref 2.5–4.6)

## 2023-08-08 LAB — BRAIN NATRIURETIC PEPTIDE: B Natriuretic Peptide: 881 pg/mL — ABNORMAL HIGH (ref 0.0–100.0)

## 2023-08-08 MED ORDER — TAMSULOSIN HCL 0.4 MG PO CAPS
0.4000 mg | ORAL_CAPSULE | Freq: Every day | ORAL | Status: DC
  Administered 2023-08-08: 0.4 mg via ORAL
  Filled 2023-08-08: qty 1

## 2023-08-08 MED ORDER — FINASTERIDE 5 MG PO TABS
5.0000 mg | ORAL_TABLET | Freq: Every day | ORAL | Status: DC
  Administered 2023-08-08 – 2023-08-09 (×2): 5 mg via ORAL
  Filled 2023-08-08 (×2): qty 1

## 2023-08-08 MED ORDER — PRAVASTATIN SODIUM 40 MG PO TABS
40.0000 mg | ORAL_TABLET | Freq: Every day | ORAL | Status: DC
  Administered 2023-08-08 – 2023-08-09 (×2): 40 mg via ORAL
  Filled 2023-08-08 (×2): qty 1

## 2023-08-08 MED ORDER — PANTOPRAZOLE SODIUM 40 MG PO TBEC
40.0000 mg | DELAYED_RELEASE_TABLET | Freq: Every day | ORAL | Status: DC
  Administered 2023-08-08 – 2023-08-09 (×2): 40 mg via ORAL
  Filled 2023-08-08 (×2): qty 1

## 2023-08-08 MED ORDER — PROCHLORPERAZINE EDISYLATE 10 MG/2ML IJ SOLN: 10.0000 mg | Freq: Four times a day (QID) | INTRAMUSCULAR | Status: DC | PRN

## 2023-08-08 MED ORDER — ENOXAPARIN SODIUM 80 MG/0.8ML IJ SOSY
80.0000 mg | PREFILLED_SYRINGE | Freq: Two times a day (BID) | INTRAMUSCULAR | Status: DC
  Administered 2023-08-08 – 2023-08-09 (×3): 80 mg via SUBCUTANEOUS
  Filled 2023-08-08 (×3): qty 0.8

## 2023-08-08 MED ORDER — CARVEDILOL 12.5 MG PO TABS
12.5000 mg | ORAL_TABLET | Freq: Two times a day (BID) | ORAL | Status: DC
  Administered 2023-08-08 – 2023-08-09 (×3): 12.5 mg via ORAL
  Filled 2023-08-08 (×3): qty 1

## 2023-08-08 MED ORDER — ACETAMINOPHEN 650 MG RE SUPP: 650.0000 mg | Freq: Four times a day (QID) | RECTAL | Status: DC | PRN

## 2023-08-08 MED ORDER — ACETAMINOPHEN 325 MG PO TABS: 650.0000 mg | ORAL_TABLET | Freq: Four times a day (QID) | ORAL | Status: DC | PRN

## 2023-08-08 MED ORDER — FUROSEMIDE 10 MG/ML IJ SOLN
40.0000 mg | Freq: Two times a day (BID) | INTRAMUSCULAR | Status: DC
  Administered 2023-08-08 (×2): 40 mg via INTRAVENOUS
  Filled 2023-08-08 (×2): qty 4

## 2023-08-08 MED ORDER — FUROSEMIDE 10 MG/ML IJ SOLN
40.0000 mg | Freq: Once | INTRAMUSCULAR | Status: AC
  Administered 2023-08-08: 40 mg via INTRAVENOUS
  Filled 2023-08-08: qty 4

## 2023-08-08 MED ORDER — ASPIRIN 81 MG PO CHEW
81.0000 mg | CHEWABLE_TABLET | Freq: Every day | ORAL | Status: DC
  Administered 2023-08-08 – 2023-08-09 (×2): 81 mg via ORAL
  Filled 2023-08-08 (×2): qty 1

## 2023-08-08 MED ORDER — ENSURE ENLIVE PO LIQD
237.0000 mL | Freq: Two times a day (BID) | ORAL | Status: DC
  Administered 2023-08-09: 237 mL via ORAL

## 2023-08-08 NOTE — TOC CM/SW Note (Signed)
 Transition of Care [facility]) - Inpatient Brief Assessment   Patient Details  Name: Taylor Cross MRN: [id number] Date of Birth: March 18, 1946  Transition of Care [facility] CM/SW Contact:    Lucie Lunger, LCSWA Phone Number: 08/08/2023, 11:21 AM   Clinical Narrative: Transition of Care Department [facility]) has reviewed patient and no TOC needs have been identified at this time. We will continue to monitor patient advancement through interdisciplinary progression rounds. If new patient transition needs arise, please place a TOC consult.  Transition of Care Asessment: Insurance and Status: Insurance coverage has been reviewed Patient has primary care physician: Yes Home environment has been reviewed: From home Prior level of function:: Independent Prior/Current Home Services: No current home services Social Drivers of Health Review: SDOH reviewed no interventions necessary Readmission risk has been reviewed: Yes Transition of care needs: no transition of care needs at this time

## 2023-08-08 NOTE — Plan of Care (Signed)

## 2023-08-08 NOTE — Progress Notes (Signed)
 PHARMACY - ANTICOAGULATION CONSULT NOTE  Pharmacy Consult for Lovenox  Indication: atrial fibrillation  Allergies  Allergen Reactions   Neosporin [Neomycin-Bacitracin  Zn-Polymyx] Rash   Voltaren  [Diclofenac  Sodium] Other (See Comments)    GI upset    Patient Measurements: Height: 5' 8 (172.7 cm) Weight: 83.5 kg (184 lb) IBW/kg (Calculated) : 68.4   Vital Signs: Temp: 98.2 F (36.8 C) (01/08 1858) Temp Source: Oral (01/08 1858) BP: 143/67 (01/09 0230) Pulse Rate: 70 (01/09 0230)  Labs: Recent Labs    08/08/23 0002 08/08/23 0140  HGB 13.7  --   HCT 41.8  --   PLT 188  --   CREATININE 1.56*  --   TROPONINIHS 17 17    Estimated Creatinine Clearance: 41.7 mL/min (A) (by C-G formula based on SCr of 1.56 mg/dL (H)).   Medical History: Past Medical History:  Diagnosis Date   Acute respiratory failure with hypoxia and hypercapnia (HCC) 07/19/2020   Acute systolic CHF (congestive heart failure) (HCC) 07/19/2020   Combined systolic and diastolic heart failure (HCC)    Hyperlipidemia     Medications:  No current facility-administered medications on file prior to encounter.   Current Outpatient Medications on File Prior to Encounter  Medication Sig Dispense Refill   acetaminophen  (TYLENOL ) 325 MG tablet Take 2 tablets (650 mg total) by mouth every 6 (six) hours as needed for mild pain, fever or headache (or Fever >/= 101). 12 tablet 0   allopurinol  (ZYLOPRIM ) 100 MG tablet Take 1 tablet (100 mg total) by mouth daily. 90 tablet 3   aspirin  81 MG chewable tablet Chew 1 tablet (81 mg total) by mouth daily with breakfast. 120 tablet 3   carvedilol  (COREG ) 12.5 MG tablet Take 1 tablet (12.5 mg total) by mouth 2 (two) times daily. 180 tablet 3   fexofenadine  (ALLEGRA ) 180 MG tablet Take 1 tablet (180 mg total) by mouth daily. For allergy symptoms 90 tablet 3   finasteride  (PROSCAR ) 5 MG tablet Take 1 tablet (5 mg total) by mouth daily. 90 tablet 3   fluticasone  (FLONASE ) 50  MCG/ACT nasal spray USE 2 SPRAYS IN EACH NOSTRIL ONCE DAILY 16 g 4   furosemide  (LASIX ) 40 MG tablet Take 1 tablet (40 mg total) by mouth daily. 90 tablet 3   gabapentin  (NEURONTIN ) 300 MG capsule Take 1 capsule (300 mg total) by mouth 2 (two) times daily. 180 capsule 3   hydrALAZINE  (APRESOLINE ) 25 MG tablet TAKE 1/2 TABLET BY MOUTH TWICE DAILY 30 tablet 6   hydroquinone  4 % cream APPLY TO AFFECTED AREAS TWICE A DAY 28.35 g 0   isosorbide  mononitrate (IMDUR ) 30 MG 24 hr tablet TAKE 1/2 TABLET BY MOUTH DAILY 45 tablet 2   mupirocin  ointment (BACTROBAN ) 2 % Apply 1 Application topically 2 (two) times daily. 22 g 0   pantoprazole  (PROTONIX ) 40 MG tablet Take 1 tablet (40 mg total) by mouth daily. TAKE 1 TABLET ONCE DAILY FOR REFLUX 90 tablet 3   pravastatin  (PRAVACHOL ) 40 MG tablet Take 1 tablet (40 mg total) by mouth daily. 90 tablet 3   sertraline  (ZOLOFT ) 50 MG tablet Take 1 tablet (50 mg total) by mouth daily. 90 tablet 3   sulfamethoxazole -trimethoprim  (BACTRIM  DS) 800-160 MG tablet Take 1 tablet by mouth 2 (two) times daily. Until gone, for infection 20 tablet 0   tamsulosin  (FLOMAX ) 0.4 MG CAPS capsule TAKE (2) CAPSULES DAILY. 180 capsule 3     Assessment: 78 y.o. male admitted with CHF and Afib for Lovenox  Goal  of Therapy:  Anti-Xa level 0.6-1 units/ml 4hrs after LMWH dose given Monitor platelets by anticoagulation protocol: Yes   Plan:  Lovenox  80 mg SQ q12h  Dail Cordella Misty 08/08/2023,2:45 AM

## 2023-08-08 NOTE — Progress Notes (Signed)
 Patient brought up to the floor from the E.D. via stretcher. Patient able to ambulate from stretcher to bed. Alert & oriented. Pleasant and cooperative.Marland Kitchen Room and unit orientation given. Call bell within reach. Bed in low position.

## 2023-08-08 NOTE — Progress Notes (Signed)
 Taylor Cross is a 78 y.o. male with medical history significant of  chronic systolic heart failure, BPH, COPD who presents to the emergency department due to a several weeks of shortness of breath that rapidly progressed within the last 2 to 3 days with shortness of breath on exertion including after taking a few steps.  He endorsed leg swelling, but denies orthopnea, chest pain, fever, nausea, vomiting.  Patient states that he has been compliant with his Lasix .   Patient was admitted with acute on chronic systolic/diastolic CHF exacerbation.  He has been started on IV Lasix  for diuresis and appears to be progressing.  Repeat 2D echocardiogram ordered and pending.  Patient has been seen and evaluated bedside and has been admitted after midnight.  Total care time: 30 minutes.

## 2023-08-08 NOTE — Progress Notes (Signed)
*  PRELIMINARY RESULTS* Echocardiogram 2D Echocardiogram has been performed.  Taylor Cross 08/08/2023, 2:43 PM

## 2023-08-08 NOTE — H&P (Signed)
 History and Physical    Patient: Taylor Cross FMW:[id number] DOB: October 19, 1945 DOA: 08/07/2023 DOS: the patient was seen and examined on 08/08/2023 PCP: Zollie Lowers, MD  Patient coming from: Home  Chief Complaint:  Chief Complaint  Patient presents with   Shortness of Breath   Dysphagia        Foot Swelling   HPI: Taylor Cross is a 78 y.o. male with medical history significant of  chronic systolic heart failure, BPH, COPD who presents to the emergency department due to a several weeks of shortness of breath that rapidly progressed within the last 2 to 3 days with shortness of breath on exertion including after taking a few steps.  He endorsed leg swelling, but denies orthopnea, chest pain, fever, nausea, vomiting.  Patient states that he has been compliant with his Lasix .  ED Course:  In the emergency department, BP was 140/78, other vital signs were within normal range.  Workup in the ED showed normal CBC except for MCV of 101.2, BMP was normal except for sodium of 133, blood glucose 104, creatinine 1.56 (creatinine is within baseline range), BNP 881 (chronically elevated with last BNP being 850 about 2 years ago).  Troponin x 1 - 17.  Influenza A, B, SARS coronavirus 2, RSV was negative. Chest x-ray showed left lower lobe atelectasis or infiltrate with small left effusion.  Mild cardiomegaly He was treated with albuterol  and IV Lasix  40 mg x 1. Hospitalist was asked to admit patient for further evaluation and management.  Review of Systems: Review of systems as noted in the HPI. All other systems reviewed and are negative.   Past Medical History:  Diagnosis Date   Acute respiratory failure with hypoxia and hypercapnia (HCC) 07/19/2020   Acute systolic CHF (congestive heart failure) (HCC) 07/19/2020   Combined systolic and diastolic heart failure (HCC)    Hyperlipidemia    Past Surgical History:  Procedure Laterality Date   HERNIA REPAIR     TONSILLECTOMY      Social  History:  reports that he has never smoked. He has never used smokeless tobacco. He reports that he does not drink alcohol  and does not use drugs.   Allergies  Allergen Reactions   Neosporin [Neomycin-Bacitracin  Zn-Polymyx] Rash   Voltaren  [Diclofenac  Sodium] Other (See Comments)    GI upset    Family History  Problem Relation Age of Onset   Heart disease Mother    Diabetes Mother    Heart failure Mother    COPD Father    Heart attack Brother 63     Prior to Admission medications   Medication Sig Start Date End Date Taking? Authorizing Provider  acetaminophen  (TYLENOL ) 325 MG tablet Take 2 tablets (650 mg total) by mouth every 6 (six) hours as needed for mild pain, fever or headache (or Fever >/= 101). 07/22/20   Pearlean Manus, MD  allopurinol  (ZYLOPRIM ) 100 MG tablet Take 1 tablet (100 mg total) by mouth daily. 11/07/22   Zollie Lowers, MD  aspirin  81 MG chewable tablet Chew 1 tablet (81 mg total) by mouth daily with breakfast. 07/22/20   Pearlean Manus, MD  carvedilol  (COREG ) 12.5 MG tablet Take 1 tablet (12.5 mg total) by mouth 2 (two) times daily. 11/07/22   Zollie Lowers, MD  fexofenadine  (ALLEGRA ) 180 MG tablet Take 1 tablet (180 mg total) by mouth daily. For allergy symptoms 05/09/23   Zollie Lowers, MD  finasteride  (PROSCAR ) 5 MG tablet Take 1 tablet (5 mg total) by mouth  daily. 05/09/23   Zollie Lowers, MD  fluticasone  (FLONASE ) 50 MCG/ACT nasal spray USE 2 SPRAYS IN EACH NOSTRIL ONCE DAILY 07/25/23   Zollie Lowers, MD  furosemide  (LASIX ) 40 MG tablet Take 1 tablet (40 mg total) by mouth daily. 06/10/23   Alvan Dorn FALCON, MD  gabapentin  (NEURONTIN ) 300 MG capsule Take 1 capsule (300 mg total) by mouth 2 (two) times daily. 11/07/22   Zollie Lowers, MD  hydrALAZINE  (APRESOLINE ) 25 MG tablet TAKE 1/2 TABLET BY MOUTH TWICE DAILY 04/08/23   Alvan Dorn FALCON, MD  hydroquinone  4 % cream APPLY TO AFFECTED AREAS TWICE A DAY 09/09/18   Zollie Lowers, MD  isosorbide   mononitrate (IMDUR ) 30 MG 24 hr tablet TAKE 1/2 TABLET BY MOUTH DAILY 03/11/23   Alvan Dorn FALCON, MD  mupirocin  ointment (BACTROBAN ) 2 % Apply 1 Application topically 2 (two) times daily. 05/16/23   Cleotilde Rogue, MD  pantoprazole  (PROTONIX ) 40 MG tablet Take 1 tablet (40 mg total) by mouth daily. TAKE 1 TABLET ONCE DAILY FOR REFLUX 11/07/22   Zollie Lowers, MD  pravastatin  (PRAVACHOL ) 40 MG tablet Take 1 tablet (40 mg total) by mouth daily. 11/07/22   Zollie Lowers, MD  sertraline  (ZOLOFT ) 50 MG tablet Take 1 tablet (50 mg total) by mouth daily. 11/07/22   Zollie Lowers, MD  sulfamethoxazole -trimethoprim  (BACTRIM  DS) 800-160 MG tablet Take 1 tablet by mouth 2 (two) times daily. Until gone, for infection 05/09/23   Zollie Lowers, MD  tamsulosin  (FLOMAX ) 0.4 MG CAPS capsule TAKE (2) CAPSULES DAILY. 05/09/23   Zollie Lowers, MD    Physical Exam: BP (!) 143/67   Pulse 70   Temp 98.2 F (36.8 C) (Oral)   Resp 18   Ht 5' 8 (1.727 m)   Wt 83.5 kg   SpO2 97%   BMI 27.98 kg/m   General: 78 y.o. year-old male well developed well nourished in no acute distress.  Alert and oriented x3. HEENT: NCAT, EOMI Neck: Supple, trachea medial Cardiovascular: Regular rate and rhythm with no rubs or gallops.  No thyromegaly or JVD noted.  +2 No lower extremity edema bilaterally. 2/4 pulses in all 4 extremities. Respiratory: Bilateral Rales in lower lobes on auscultation with no wheezes.  Abdomen: Soft, nontender nondistended with normal bowel sounds x4 quadrants. Muskuloskeletal: No cyanosis, clubbing or edema noted bilaterally Neuro: CN II-XII intact, strength 5/5 x 4, sensation, reflexes intact Skin: No ulcerative lesions noted or rashes Psychiatry: Judgement and insight appear normal. Mood is appropriate for condition and setting          Labs on Admission:  Basic Metabolic Panel: Recent Labs  Lab 08/08/23 0002  NA 133*  K 3.9  CL 98  CO2 26  GLUCOSE 104*  BUN 19  CREATININE 1.56*   CALCIUM 9.0   Liver Function Tests: No results for input(s): AST, ALT, ALKPHOS, BILITOT, PROT, ALBUMIN in the last 168 hours. No results for input(s): LIPASE, AMYLASE in the last 168 hours. No results for input(s): AMMONIA in the last 168 hours. CBC: Recent Labs  Lab 08/08/23 0002  WBC 6.2  NEUTROABS 3.8  HGB 13.7  HCT 41.8  MCV 101.2*  PLT 188   Cardiac Enzymes: No results for input(s): CKTOTAL, CKMB, CKMBINDEX, TROPONINI in the last 168 hours.  BNP (last 3 results) Recent Labs    08/08/23 0002  BNP 881.0*    ProBNP (last 3 results) No results for input(s): PROBNP in the last 8760 hours.  CBG: No results for input(s): GLUCAP in  the last 168 hours.  Radiological Exams on Admission: DG Chest 2 View Result Date: 08/07/2023 CLINICAL DATA:  Shortness of breath EXAM: CHEST - 2 VIEW COMPARISON:  04/09/2021 FINDINGS: Mild cardiomegaly. Mediastinal contours within normal limits. Aortic atherosclerosis. Small left pleural effusion with left lower lobe atelectasis or infiltrate. No confluent opacity on the right. No acute bony abnormality. IMPRESSION: Left lower lobe atelectasis or infiltrate with small left effusion. Mild cardiomegaly. Electronically Signed   By: Franky Crease M.D.   On: 08/07/2023 23:10    EKG: I independently viewed the EKG done and my findings are as followed: Atrial fibrillation with rate control and paired PVCs with QTc of 501 ms  Assessment/Plan Present on Admission:  Acute on chronic combined systolic and diastolic CHF (congestive heart failure) (HCC)  Acute respiratory failure with hypoxia (HCC)  Benign prostatic hyperplasia  Gastroesophageal reflux disease with esophagitis  COPD (chronic obstructive pulmonary disease) (HCC)  Principal Problem:   Acute on chronic combined systolic and diastolic CHF (congestive heart failure) (HCC) Active Problems:   COPD (chronic obstructive pulmonary disease) (HCC)   Gastroesophageal  reflux disease with esophagitis   Acute respiratory failure with hypoxia (HCC)   Benign prostatic hyperplasia   Elevated brain natriuretic peptide (BNP) level   Paroxysmal atrial fibrillation (HCC)   Prolonged QT interval  Acute on chronic systolic and diastolic CHF Elevated BNP BNP is chronically elevated, currently at 881 (this was 850 about 2 years ago) Continue total input/output, daily weights and fluid restriction Continue IV Lasix  40 mg twice daily Continue aspirin , Coreg  Continue heart healthy diet  Echocardiogram done in February 2022 showed LVEF of 40 to 45%, LV with global hypokinesis, G1 DD.  Echocardiogram in the morning Consider cardiology consult based on echo findings  Acute respiratory failure with hypoxia in the setting of above Continue supplemental oxygen via [place] to maintain O2 sats > 92% with plan to wean patient off this as tolerated.  Of note, patient does not use supplemental oxygen at baseline  Elevated MCV MCV 101.2, folate and vitamin B12 levels will be checked  Prolonged QT interval QTc 501 ms Avoid QT prolonging drugs Magnesium level will be checked Repeat EKG in the morning  Paroxysmal atrial fibrillation Continue Coreg  Patient was not on any anticoagulant Continue Lovenox  with plan to transition to oral DOAC if patient continues to have A-fib  COPD (not in acute exacerbation) Continue bronchodilators  BPH Continue finasteride  and Flomax    GERD Continue Protonix    DVT prophylaxis: Lovenox   Code Status: Full code  Family Communication: None at bedside  Consults: None  Severity of Illness: The appropriate patient status for this patient is INPATIENT. Inpatient status is judged to be reasonable and necessary in order to provide the required intensity of service to ensure the patient's safety. The patient's presenting symptoms, physical exam findings, and initial radiographic and laboratory data in the context of their chronic  comorbidities is felt to place them at high risk for further clinical deterioration. Furthermore, it is not anticipated that the patient will be medically stable for discharge from the hospital within 2 midnights of admission.   * I certify that at the point of admission it is my clinical judgment that the patient will require inpatient hospital care spanning beyond 2 midnights from the point of admission due to high intensity of service, high risk for further deterioration and high frequency of surveillance required.*  Author: Que Meneely, DO 08/08/2023 3:05 AM  For on call review [email or web address].

## 2023-08-09 DIAGNOSIS — I5043 Acute on chronic combined systolic (congestive) and diastolic (congestive) heart failure: Secondary | ICD-10-CM | POA: Diagnosis not present

## 2023-08-09 LAB — BASIC METABOLIC PANEL
Anion gap: 9 (ref 5–15)
BUN: 20 mg/dL (ref 8–23)
CO2: 25 mmol/L (ref 22–32)
Calcium: 8.4 mg/dL — ABNORMAL LOW (ref 8.9–10.3)
Chloride: 98 mmol/L (ref 98–111)
Creatinine, Ser: 1.52 mg/dL — ABNORMAL HIGH (ref 0.61–1.24)
GFR, Estimated: 47 mL/min — ABNORMAL LOW (ref >60)
Glucose, Bld: 95 mg/dL (ref 70–99)
Potassium: 3 mmol/L — ABNORMAL LOW (ref 3.5–5.1)
Sodium: 132 mmol/L — ABNORMAL LOW (ref 135–145)

## 2023-08-09 LAB — MAGNESIUM: Magnesium: 1.9 mg/dL (ref 1.7–2.4)

## 2023-08-09 MED ORDER — POTASSIUM CHLORIDE CRYS ER 20 MEQ PO TBCR
40.0000 meq | EXTENDED_RELEASE_TABLET | Freq: Once | ORAL | Status: AC
  Administered 2023-08-09: 40 meq via ORAL
  Filled 2023-08-09: qty 2

## 2023-08-09 MED ORDER — POTASSIUM CHLORIDE CRYS ER 20 MEQ PO TBCR: 20.0000 meq | EXTENDED_RELEASE_TABLET | Freq: Every day | ORAL | 2 refills | Status: DC

## 2023-08-09 MED ORDER — NAPHAZOLINE-PHENIRAMINE 0.025-0.3 % OP SOLN
1.0000 [drp] | Freq: Four times a day (QID) | OPHTHALMIC | Status: DC | PRN
  Filled 2023-08-09: qty 5

## 2023-08-09 MED ORDER — FUROSEMIDE 20 MG PO TABS: 60.0000 mg | ORAL_TABLET | Freq: Every day | ORAL | 11 refills | Status: DC

## 2023-08-09 MED ORDER — ENSURE ENLIVE PO LIQD: 237.0000 mL | Freq: Two times a day (BID) | ORAL | 12 refills | Status: AC

## 2023-08-09 NOTE — Discharge Summary (Signed)
 Physician Discharge Summary  JEREK MEULEMANS FMW:[id number] DOB: [birth year] DOA: 08/07/2023  PCP: Zollie Lowers, MD  Admit date: 08/07/2023  Discharge date: 08/09/2023  Admitted From:Home  Disposition:  Home  Recommendations for Outpatient Follow-up:  Follow up with PCP in 1-2 weeks Follow-up with cardiology Dr. Alvan for outpatient visit which will be scheduled soon Continue now on Lasix  60 mg daily as prescribed Continue other home medications as prior  Home Health: None  Equipment/Devices: None  Discharge Condition:Stable  CODE STATUS: Full  Diet recommendation: Heart Healthy  Brief/Interim Summary: Taylor Cross is a 78 y.o. male with medical history significant of  chronic systolic heart failure, BPH, COPD who presents to the emergency department due to a several weeks of shortness of breath that rapidly progressed within the last 2 to 3 days with shortness of breath on exertion including after taking a few steps.  He endorsed leg swelling, but denies orthopnea, chest pain, fever, nausea, vomiting.  Patient states that he has been compliant with his Lasix .    Patient was admitted with acute on chronic systolic/diastolic CHF exacerbation.  He has been started on IV Lasix  for diuresis and now appears euvolemic.  Repeat 2D echocardiogram appears stable.  Discussion had with cardiologist Dr. Alvan who recommends increasing Lasix  to 60 mg daily and will follow-up outpatient.  He appears to be in stable condition otherwise for discharge.  No other acute events or concerns noted.  Discharge Diagnoses:  Principal Problem:   Acute on chronic combined systolic and diastolic CHF (congestive heart failure) (HCC) Active Problems:   COPD (chronic obstructive pulmonary disease) (HCC)   Gastroesophageal reflux disease with esophagitis   Acute respiratory failure with hypoxia (HCC)   Benign prostatic hyperplasia   Elevated brain natriuretic peptide (BNP) level   Paroxysmal atrial  fibrillation (HCC)   Prolonged QT interval  Principal discharge diagnosis: Acute on chronic combined systolic and diastolic CHF exacerbation.  Discharge Instructions  Discharge Instructions     Diet - low sodium heart healthy   Complete by: As directed    Increase activity slowly   Complete by: As directed       Allergies as of 08/09/2023       Reactions   Neosporin [neomycin-bacitracin  Zn-polymyx] Rash   Voltaren  [diclofenac  Sodium] Other (See Comments)   GI upset        Medication List     TAKE these medications    acetaminophen  325 MG tablet Commonly known as: TYLENOL  Take 2 tablets (650 mg total) by mouth every 6 (six) hours as needed for mild pain, fever or headache (or Fever >/= 101).   allopurinol  100 MG tablet Commonly known as: ZYLOPRIM  Take 1 tablet (100 mg total) by mouth daily.   aspirin  81 MG chewable tablet Chew 1 tablet (81 mg total) by mouth daily with breakfast.   carvedilol  12.5 MG tablet Commonly known as: COREG  Take 1 tablet (12.5 mg total) by mouth 2 (two) times daily.   feeding supplement Liqd Take 237 mLs by mouth 2 (two) times daily between meals.   fexofenadine  180 MG tablet Commonly known as: ALLEGRA  Take 1 tablet (180 mg total) by mouth daily. For allergy symptoms   finasteride  5 MG tablet Commonly known as: PROSCAR  Take 1 tablet (5 mg total) by mouth daily.   fluticasone  50 MCG/ACT nasal spray Commonly known as: FLONASE  USE 2 SPRAYS IN EACH NOSTRIL ONCE DAILY   furosemide  20 MG tablet Commonly known as: Lasix  Take 3 tablets (60 mg  total) by mouth daily. What changed:  medication strength how much to take   gabapentin  300 MG capsule Commonly known as: NEURONTIN  Take 1 capsule (300 mg total) by mouth 2 (two) times daily.   hydrALAZINE  25 MG tablet Commonly known as: APRESOLINE  TAKE 1/2 TABLET BY MOUTH TWICE DAILY   hydroquinone  4 % cream APPLY TO AFFECTED AREAS TWICE A DAY   isosorbide  mononitrate 30 MG 24 hr  tablet Commonly known as: IMDUR  TAKE 1/2 TABLET BY MOUTH DAILY   mupirocin  ointment 2 % Commonly known as: BACTROBAN  Apply 1 Application topically 2 (two) times daily.   pantoprazole  40 MG tablet Commonly known as: PROTONIX  Take 1 tablet (40 mg total) by mouth daily. TAKE 1 TABLET ONCE DAILY FOR REFLUX   potassium chloride  SA 20 MEQ tablet Commonly known as: KLOR-CON  M Take 1 tablet (20 mEq total) by mouth daily.   pravastatin  40 MG tablet Commonly known as: PRAVACHOL  Take 1 tablet (40 mg total) by mouth daily.   sertraline  50 MG tablet Commonly known as: ZOLOFT  Take 1 tablet (50 mg total) by mouth daily.   sulfamethoxazole -trimethoprim  800-160 MG tablet Commonly known as: BACTRIM  DS Take 1 tablet by mouth 2 (two) times daily. Until gone, for infection   tamsulosin  0.4 MG Caps capsule Commonly known as: FLOMAX  TAKE (2) CAPSULES DAILY.        Follow-up Information     [name], Butler, MD. Schedule an appointment as soon as possible for a visit in 1 week(s).   Specialty: Family Medicine Contact information: [address] [place] [address] [phone]         Alvan Dorn FALCON, MD. Go to.   Specialty: Cardiology Contact information: [address] [place] [address] [phone]                Allergies  Allergen Reactions   Neosporin [Neomycin-Bacitracin  Zn-Polymyx] Rash   Voltaren  [Diclofenac  Sodium] Other (See Comments)    GI upset    Consultations: None   Procedures/Studies: ECHOCARDIOGRAM COMPLETE Result Date: 08/08/2023    ECHOCARDIOGRAM REPORT   Patient Name:   Taylor Cross Date of Exam: 08/08/2023 Medical Rec #:  [id number]     Height:       68.0 in Accession #:    [id number]    Weight:       184.5 lb Date of Birth:  Aug 30, 1945     BSA:          1.975 m Patient Age:    77 years      BP:           130/80 mmHg Patient Gender: M             HR:           60 bpm. Exam Location:  [facility] Procedure: 2D Echo, 3D Echo, Cardiac Doppler  and Color Doppler Indications:    CHF  History:        Patient has prior history of Echocardiogram examinations, most                 recent 09/07/2020. CHF, COPD; Risk Factors:Dyslipidemia,                 Hypertension and Non-Smoker.  Sonographer:    Tillman Nora RVT RCS Referring Phys: [id number] OLADAPO ADEFESO IMPRESSIONS  1. Left ventricular ejection fraction, by estimation, is 40 to 45%. The left ventricle has mildly decreased function. The left ventricle demonstrates global hypokinesis. There is  mild left ventricular hypertrophy. Left ventricular diastolic parameters are indeterminate.  2. Right ventricular systolic function is normal. The right ventricular size is normal. Tricuspid regurgitation signal is inadequate for assessing PA pressure.  3. Left atrial size was moderately dilated.  4. Moderate pleural effusion in the left lateral region.  5. The mitral valve is abnormal. Mild mitral valve regurgitation. No evidence of mitral stenosis.  6. The aortic valve has an indeterminant number of cusps. There is mild calcification of the aortic valve. There is mild thickening of the aortic valve. Aortic valve regurgitation is not visualized. Mild aortic valve stenosis. Aortic valve area, by VTI measures 1.62 cm. Aortic valve mean gradient measures 10.0 mmHg. Aortic valve Vmax measures 1.99 m/s. FINDINGS  Left Ventricle: Left ventricular ejection fraction, by estimation, is 40 to 45%. The left ventricle has mildly decreased function. The left ventricle demonstrates global hypokinesis. The left ventricular internal cavity size was normal in size. There is  mild left ventricular hypertrophy. Left ventricular diastolic parameters are indeterminate. Right Ventricle: The right ventricular size is normal. Right vetricular wall thickness was not well visualized. Right ventricular systolic function is normal. Tricuspid regurgitation signal is inadequate for assessing PA pressure. Left Atrium: Left atrial size was moderately  dilated. Right Atrium: Right atrial size was normal in size. Pericardium: There is no evidence of pericardial effusion. Mitral Valve: The mitral valve is abnormal. Mild mitral valve regurgitation. No evidence of mitral valve stenosis. Tricuspid Valve: The tricuspid valve is normal in structure. Tricuspid valve regurgitation is trivial. No evidence of tricuspid stenosis. Aortic Valve: The aortic valve has an indeterminant number of cusps. There is mild calcification of the aortic valve. There is mild thickening of the aortic valve. There is mild aortic valve annular calcification. Aortic valve regurgitation is not visualized. Mild aortic stenosis is present. Aortic valve mean gradient measures 10.0 mmHg. Aortic valve peak gradient measures 15.8 mmHg. Aortic valve area, by VTI measures 1.62 cm. Pulmonic Valve: The pulmonic valve was not well visualized. Pulmonic valve regurgitation is not visualized. No evidence of pulmonic stenosis. Aorta: The aortic root is normal in size and structure. Venous: The inferior vena cava was not well visualized. IAS/Shunts: The interatrial septum was not well visualized. Additional Comments: There is a moderate pleural effusion in the left lateral region.  LEFT VENTRICLE PLAX 2D LVIDd:         5.50 cm LVIDs:         4.40 cm LV PW:         1.10 cm LV IVS:        1.10 cm LVOT diam:     2.00 cm   3D Volume EF: LV SV:         71        3D EF:        33 % LV SV Index:   36        LV EDV:       122 ml LVOT Area:     3.14 cm  LV ESV:       82 ml                          LV SV:        40 ml RIGHT VENTRICLE RV Basal diam:  4.10 cm RV S prime:     8.93 cm/s TAPSE (M-mode): 1.4 cm LEFT ATRIUM  Index        RIGHT ATRIUM           Index LA diam:        4.30 cm 2.18 cm/m   RA Area:     18.80 cm LA Vol (A2C):   83.9 ml 42.48 ml/m  RA Volume:   56.30 ml  28.51 ml/m LA Vol (A4C):   98.3 ml 49.77 ml/m LA Biplane Vol: 91.8 ml 46.48 ml/m  AORTIC VALVE                     PULMONIC VALVE  AV Area (Vmax):    1.45 cm      PV Vmax:       0.90 m/s AV Area (Vmean):   1.37 cm      PV Peak grad:  3.3 mmHg AV Area (VTI):     1.62 cm AV Vmax:           198.50 cm/s AV Vmean:          135.750 cm/s AV VTI:            0.437 m AV Peak Grad:      15.8 mmHg AV Mean Grad:      10.0 mmHg LVOT Vmax:         91.50 cm/s LVOT Vmean:        59.075 cm/s LVOT VTI:          0.225 m LVOT/AV VTI ratio: 0.52  AORTA Ao Root diam: 2.90 cm Ao Asc diam:  3.10 cm MITRAL VALVE MV Area (PHT): 3.37 cm     SHUNTS MV Decel Time: 225 msec     Systemic VTI:  0.23 m MV E velocity: 106.00 cm/s  Systemic Diam: 2.00 cm MV A velocity: 32.45 cm/s MV E/A ratio:  3.27 Dorn Ross MD Electronically signed by Dorn Ross MD Signature Date/Time: 08/08/2023/4:02:14 PM    Final    DG Chest 2 View Result Date: 08/07/2023 CLINICAL DATA:  Shortness of breath EXAM: CHEST - 2 VIEW COMPARISON:  04/09/2021 FINDINGS: Mild cardiomegaly. Mediastinal contours within normal limits. Aortic atherosclerosis. Small left pleural effusion with left lower lobe atelectasis or infiltrate. No confluent opacity on the right. No acute bony abnormality. IMPRESSION: Left lower lobe atelectasis or infiltrate with small left effusion. Mild cardiomegaly. Electronically Signed   By: Franky Crease M.D.   On: 08/07/2023 23:10     Discharge Exam: Vitals:   08/09/23 0508 08/09/23 0806  BP: 113/79 125/70  Pulse: (!) 54   Resp: 18 15  Temp: 99.2 F (37.3 C) 98.5 F (36.9 C)  SpO2: 93% 93%   Vitals:   08/09/23 0247 08/09/23 0500 08/09/23 0508 08/09/23 0806  BP: (!) 120/58  113/79 125/70  Pulse: (!) 58  (!) 54   Resp:   18 15  Temp:   99.2 F (37.3 C) 98.5 F (36.9 C)  TempSrc:   Oral Oral  SpO2:   93% 93%  Weight:  81.9 kg    Height:        General: Pt is alert, awake, not in acute distress Cardiovascular: RRR, S1/S2 +, no rubs, no gallops Respiratory: CTA bilaterally, no wheezing, no rhonchi Abdominal: Soft, NT, ND, bowel sounds + Extremities:  no edema, no cyanosis    The results of significant diagnostics from this hospitalization (including imaging, microbiology, ancillary and laboratory) are listed below for reference.     Microbiology: Recent Results (from the past 240 hours)  Resp panel by RT-PCR (RSV, Flu A&B, Covid) Anterior Nasal Swab     Status: None   Collection Time: 08/07/23 10:06 PM   Specimen: Anterior Nasal Swab  Result Value Ref Range Status   SARS Coronavirus 2 by RT PCR NEGATIVE NEGATIVE Final    Comment: (NOTE) SARS-CoV-2 target nucleic acids are NOT DETECTED.  The SARS-CoV-2 RNA is generally detectable in upper respiratory specimens during the acute phase of infection. The lowest concentration of SARS-CoV-2 viral copies this assay can detect is 138 copies/mL. A negative result does not preclude SARS-Cov-2 infection and should not be used as the sole basis for treatment or other patient management decisions. A negative result may occur with  improper specimen collection/handling, submission of specimen other than nasopharyngeal swab, presence of viral mutation(s) within the areas targeted by this assay, and inadequate number of viral copies(<138 copies/mL). A negative result must be combined with clinical observations, patient history, and epidemiological information. The expected result is Negative.  Fact Sheet for Patients:  bloggercourse.com  Fact Sheet for Healthcare Providers:  seriousbroker.it  This test is no t yet approved or cleared by the [place]  FDA and  has been authorized for detection and/or diagnosis of SARS-CoV-2 by FDA under an Emergency Use Authorization (EUA). This EUA will remain  in effect (meaning this test can be used) for the duration of the COVID-19 declaration under Section 564(b)(1) of the Act, 21 U.S.C.section 360bbb-3(b)(1), unless the authorization is terminated  or revoked sooner.       Influenza A by PCR  NEGATIVE NEGATIVE Final   Influenza B by PCR NEGATIVE NEGATIVE Final    Comment: (NOTE) The Xpert Xpress SARS-CoV-2/FLU/RSV plus assay is intended as an aid in the diagnosis of influenza from Nasopharyngeal swab specimens and should not be used as a sole basis for treatment. Nasal washings and aspirates are unacceptable for Xpert Xpress SARS-CoV-2/FLU/RSV testing.  Fact Sheet for Patients: bloggercourse.com  Fact Sheet for Healthcare Providers: seriousbroker.it  This test is not yet approved or cleared by the [place]  FDA and has been authorized for detection and/or diagnosis of SARS-CoV-2 by FDA under an Emergency Use Authorization (EUA). This EUA will remain in effect (meaning this test can be used) for the duration of the COVID-19 declaration under Section 564(b)(1) of the Act, 21 U.S.C. section 360bbb-3(b)(1), unless the authorization is terminated or revoked.     Resp Syncytial Virus by PCR NEGATIVE NEGATIVE Final    Comment: (NOTE) Fact Sheet for Patients: bloggercourse.com  Fact Sheet for Healthcare Providers: seriousbroker.it  This test is not yet approved or cleared by the [place]  FDA and has been authorized for detection and/or diagnosis of SARS-CoV-2 by FDA under an Emergency Use Authorization (EUA). This EUA will remain in effect (meaning this test can be used) for the duration of the COVID-19 declaration under Section 564(b)(1) of the Act, 21 U.S.C. section 360bbb-3(b)(1), unless the authorization is terminated or revoked.  Performed at [facility], [address], [place], [place] [address]      Labs: BNP (last 3 results) Recent Labs    08/08/23 0002  BNP 881.0*   Basic Metabolic Panel: Recent Labs  Lab 08/08/23 0002 08/08/23 0443 08/09/23 0414  NA 133* 135 132*  K 3.9 3.7 3.0*  CL 98 98 98  CO2 26 28 25   GLUCOSE 104* 107* 95  BUN 19  18 20   CREATININE 1.56* 1.45* 1.52*  CALCIUM 9.0 9.1 8.4*  MG  --  2.1 1.9  PHOS  --  4.1  --    Liver Function Tests: Recent Labs  Lab 08/08/23 0443  AST 15  ALT 12  ALKPHOS 85  BILITOT 1.5*  PROT 7.2  ALBUMIN 3.6   No results for input(s): LIPASE, AMYLASE in the last 168 hours. No results for input(s): AMMONIA in the last 168 hours. CBC: Recent Labs  Lab 08/08/23 0002 08/08/23 0443  WBC 6.2 5.7  NEUTROABS 3.8  --   HGB 13.7 13.2  HCT 41.8 39.3  MCV 101.2* 102.1*  PLT 188 188   Cardiac Enzymes: No results for input(s): CKTOTAL, CKMB, CKMBINDEX, TROPONINI in the last 168 hours. BNP: Invalid input(s): POCBNP CBG: No results for input(s): GLUCAP in the last 168 hours. D-Dimer No results for input(s): DDIMER in the last 72 hours. Hgb A1c No results for input(s): HGBA1C in the last 72 hours. Lipid Profile No results for input(s): CHOL, HDL, LDLCALC, TRIG, CHOLHDL, LDLDIRECT in the last 72 hours. Thyroid  function studies No results for input(s): TSH, T4TOTAL, T3FREE, THYROIDAB in the last 72 hours.  Invalid input(s): FREET3 Anemia work up Recent Labs    08/08/23 0140  VITAMINB12 432  FOLATE 17.4   Urinalysis    Component Value Date/Time   APPEARANCEUR Clear 10/20/2019 1210   GLUCOSEU Negative 10/20/2019 1210   BILIRUBINUR Negative 10/20/2019 1210   PROTEINUR Negative 10/20/2019 1210   NITRITE Negative 10/20/2019 1210   LEUKOCYTESUR Negative 10/20/2019 1210   Sepsis Labs Recent Labs  Lab 08/08/23 0002 08/08/23 0443  WBC 6.2 5.7   Microbiology Recent Results (from the past 240 hours)  Resp panel by RT-PCR (RSV, Flu A&B, Covid) Anterior Nasal Swab     Status: None   Collection Time: 08/07/23 10:06 PM   Specimen: Anterior Nasal Swab  Result Value Ref Range Status   SARS Coronavirus 2 by RT PCR NEGATIVE NEGATIVE Final    Comment: (NOTE) SARS-CoV-2 target nucleic acids are NOT DETECTED.  The SARS-CoV-2  RNA is generally detectable in upper respiratory specimens during the acute phase of infection. The lowest concentration of SARS-CoV-2 viral copies this assay can detect is 138 copies/mL. A negative result does not preclude SARS-Cov-2 infection and should not be used as the sole basis for treatment or other patient management decisions. A negative result may occur with  improper specimen collection/handling, submission of specimen other than nasopharyngeal swab, presence of viral mutation(s) within the areas targeted by this assay, and inadequate number of viral copies(<138 copies/mL). A negative result must be combined with clinical observations, patient history, and epidemiological information. The expected result is Negative.  Fact Sheet for Patients:  bloggercourse.com  Fact Sheet for Healthcare Providers:  seriousbroker.it  This test is no t yet approved or cleared by the [place]  FDA and  has been authorized for detection and/or diagnosis of SARS-CoV-2 by FDA under an Emergency Use Authorization (EUA). This EUA will remain  in effect (meaning this test can be used) for the duration of the COVID-19 declaration under Section 564(b)(1) of the Act, 21 U.S.C.section 360bbb-3(b)(1), unless the authorization is terminated  or revoked sooner.       Influenza A by PCR NEGATIVE NEGATIVE Final   Influenza B by PCR NEGATIVE NEGATIVE Final    Comment: (NOTE) The Xpert Xpress SARS-CoV-2/FLU/RSV plus assay is intended as an aid in the diagnosis of influenza from Nasopharyngeal swab specimens and should not be used as a sole basis for treatment. Nasal washings and aspirates are unacceptable for Xpert Xpress SARS-CoV-2/FLU/RSV testing.  Fact Sheet for  Patients: bloggercourse.com  Fact Sheet for Healthcare Providers: seriousbroker.it  This test is not yet approved or cleared by the  [place]  FDA and has been authorized for detection and/or diagnosis of SARS-CoV-2 by FDA under an Emergency Use Authorization (EUA). This EUA will remain in effect (meaning this test can be used) for the duration of the COVID-19 declaration under Section 564(b)(1) of the Act, 21 U.S.C. section 360bbb-3(b)(1), unless the authorization is terminated or revoked.     Resp Syncytial Virus by PCR NEGATIVE NEGATIVE Final    Comment: (NOTE) Fact Sheet for Patients: bloggercourse.com  Fact Sheet for Healthcare Providers: seriousbroker.it  This test is not yet approved or cleared by the [place]  FDA and has been authorized for detection and/or diagnosis of SARS-CoV-2 by FDA under an Emergency Use Authorization (EUA). This EUA will remain in effect (meaning this test can be used) for the duration of the COVID-19 declaration under Section 564(b)(1) of the Act, 21 U.S.C. section 360bbb-3(b)(1), unless the authorization is terminated or revoked.  Performed at [facility], [address] [place], [place] [address]      Time coordinating discharge: 35 minutes  SIGNED:   Adron JONETTA Fairly, DO [facility] 08/09/2023, 9:10 AM  If 7PM-7AM, please contact night-coverage [email or web address]

## 2023-08-09 NOTE — Progress Notes (Signed)
 Received call from telemetry stating patient has had a couple 2 second pauses. Longest being 2.1 seconds. Patient asymptomatic. BP 120/58. Dr. Thomes Dinning notified. No new orders received. Will continue to monitor.

## 2023-08-12 ENCOUNTER — Telehealth: Payer: Self-pay | Admitting: *Deleted

## 2023-08-12 NOTE — Transitions of Care (Post Inpatient/ED Visit) (Signed)
 08/12/2023  Name: Taylor Cross MRN: [id number] DOB: [birth year]  Today's TOC FU Call Status: Today's TOC FU Call Status:: Successful TOC FU Call Completed TOC FU Call Complete Date: 08/12/23 Patient's Name and Date of Birth confirmed.  Transition Care Management Follow-up Telephone Call Date of Discharge: 08/09/23 [facility] Type of Discharge: Inpatient Admission Primary Inpatient Discharge Diagnosis:: acute on chronic congestive heart failure How have you been since you were released from the hospital?:  (pt states  I feel great, weighing daily wt 179 lbs, eating, drinking well, bowel/ bladder wnl, states  no swelling, no shortness of breath) Any questions or concerns?: No  Items Reviewed: Did you receive and understand the discharge instructions provided?: Yes Medications obtained,verified, and reconciled?: Yes (Medications Reviewed) Any new allergies since your discharge?: No Dietary orders reviewed?: Yes Type of Diet Ordered:: low sodium    heart healthy Do you have support at home?: Yes People in Home: alone Name of Support/Comfort Primary Source: can call on 2 neighbors that live right by pt Reviewed HF action plan, importance of continuing daily weights Reviewed heart healthy, low sodium diet (pt eats frozen meals, canned foods), RN reviewed these are high in sodium, choose fresh or frozen vegetables, lean meats, pt states he may try strawberry Ensure. Patient declines enrollment in 30 day program, states  I don't need or want anything  Medications Reviewed Today: Medications Reviewed Today     Reviewed by Aura Mliss LABOR, RN (Registered Nurse) on 08/12/23 at 1306  Med List Status: <None>   Medication Order Taking? Sig Documenting Provider Last Dose Status Informant  acetaminophen  (TYLENOL ) 325 MG tablet [id number] Yes Take 2 tablets (650 mg total) by mouth every 6 (six) hours as needed for mild pain, fever or headache (or Fever >/= 101).  Pearlean Manus, MD Taking Active Pharmacy Records, Self  allopurinol  (ZYLOPRIM ) 100 MG tablet [id number] Yes Take 1 tablet (100 mg total) by mouth daily. Zollie Lowers, MD Taking Active Pharmacy Records, Self  aspirin  81 MG chewable tablet [id number] Yes Chew 1 tablet (81 mg total) by mouth daily with breakfast. Pearlean Manus, MD Taking Active Pharmacy Records, Self  carvedilol  (COREG ) 12.5 MG tablet [id number] Yes Take 1 tablet (12.5 mg total) by mouth 2 (two) times daily. Zollie Lowers, MD Taking Active Pharmacy Records, Self  feeding supplement (ENSURE ENLIVE / ENSURE PLUS) LIQD [id number] No Take 237 mLs by mouth 2 (two) times daily between meals.  Patient not taking: Reported on 08/12/2023   Maree Bracken D, DO Not Taking Active   fexofenadine  (ALLEGRA ) 180 MG tablet [id number]  Take 1 tablet (180 mg total) by mouth daily. For allergy symptoms Zollie Lowers, MD  Active Pharmacy Records, Self  finasteride  (PROSCAR ) 5 MG tablet [id number] Yes Take 1 tablet (5 mg total) by mouth daily. Zollie Lowers, MD Taking Active Pharmacy Records, Self  fluticasone  (FLONASE ) 50 MCG/ACT nasal spray [id number] Yes USE 2 SPRAYS IN EACH NOSTRIL ONCE DAILY Zollie Lowers, MD Taking Active Pharmacy Records, Self  furosemide  (LASIX ) 20 MG tablet [id number] Yes Take 3 tablets (60 mg total) by mouth daily. Maree, Pratik D, DO Taking Active   gabapentin  (NEURONTIN ) 300 MG capsule [id number] Yes Take 1 capsule (300 mg total) by mouth 2 (two) times daily. Zollie Lowers, MD Taking Active Pharmacy Records, Self  hydrALAZINE  (APRESOLINE ) 25 MG tablet [id number] Yes TAKE 1/2 TABLET BY MOUTH TWICE DAILY Branch, Dorn FALCON, MD Taking Active Pharmacy Records, Self  hydroquinone  4 % cream [id number] No APPLY  TO AFFECTED AREAS TWICE A DAY  Patient not taking: No sig reported   Zollie Lowers, MD Not Taking Active Pharmacy Records, Self  isosorbide  mononitrate (IMDUR ) 30 MG 24 hr tablet [id number] Yes TAKE 1/2 TABLET BY MOUTH DAILY  Branch, Dorn FALCON, MD Taking Active Pharmacy Records, Self  mupirocin  ointment (BACTROBAN ) 2 % [id number] No Apply 1 Application topically 2 (two) times daily.  Patient not taking: Reported on 08/08/2023   Cleotilde Rogue, MD Not Taking Active Pharmacy Records, Self  pantoprazole  (PROTONIX ) 40 MG tablet [id number] Yes Take 1 tablet (40 mg total) by mouth daily. TAKE 1 TABLET ONCE DAILY FOR REFLUX [name], Lowers, MD Taking Active Pharmacy Records, Self  potassium chloride  SA (KLOR-CON  M) 20 MEQ tablet [id number] Yes Take 1 tablet (20 mEq total) by mouth daily. Maree, Pratik D, DO Taking Active   pravastatin  (PRAVACHOL ) 40 MG tablet [id number] Yes Take 1 tablet (40 mg total) by mouth daily. Zollie Lowers, MD Taking Active Pharmacy Records, Self  sertraline  (ZOLOFT ) 50 MG tablet [id number] Yes Take 1 tablet (50 mg total) by mouth daily. Zollie Lowers, MD Taking Active Pharmacy Records, Self  sulfamethoxazole -trimethoprim  (BACTRIM  DS) 800-160 MG tablet [id number] No Take 1 tablet by mouth 2 (two) times daily. Until gone, for infection  Patient not taking: Reported on 08/08/2023   Zollie Lowers, MD Not Taking Active Pharmacy Records, Self  tamsulosin  (FLOMAX ) 0.4 MG CAPS capsule [id number] Yes TAKE (2) CAPSULES DAILY. Zollie Lowers, MD Taking Active Pharmacy Records, Self            Home Care and Equipment/Supplies: Were Home Health Services Ordered?: No Any new equipment or medical supplies ordered?: No  Functional Questionnaire: Do you need assistance with bathing/showering or dressing?: No Do you need assistance with meal preparation?: No Do you need assistance with eating?: No Do you have difficulty maintaining continence: No Do you need assistance with getting out of bed/getting out of a chair/moving?: No Do you have difficulty managing or taking your medications?: No  Follow up appointments reviewed: PCP Follow-up appointment confirmed?: No MD Provider Line Number:[phone] Given: No (pt  states he called PCP office and told them he is not coming in for appt, refuses and doesn't feel it's necessary, states will see cardiologist next month) Specialist Hospital Follow-up appointment confirmed?: Yes Date of Specialist follow-up appointment?: 09/26/23 Follow-Up Specialty Provider:: DR. Alvan  2/27 @ 4 pm Do you need transportation to your follow-up appointment?: No Do you understand care options if your condition(s) worsen?: Yes-patient verbalized understanding    Mliss Creed [facility], BSN RN Care Manager/ Transition of Care [place] Population [facility] [phone]

## 2023-09-19 ENCOUNTER — Ambulatory Visit: Payer: Medicare Other

## 2023-09-19 VITALS — Ht 68.0 in | Wt 180.0 lb

## 2023-09-19 DIAGNOSIS — Z0000 Encounter for general adult medical examination without abnormal findings: Secondary | ICD-10-CM | POA: Diagnosis not present

## 2023-09-19 NOTE — Progress Notes (Signed)
Subjective:   Taylor Cross is a 78 y.o. who presents for a Medicare Wellness preventive visit.  Visit Complete: Virtual I connected with  Taylor Cross on 09/19/23 by a audio enabled telemedicine application and verified that I am speaking with the correct person using two identifiers.  Patient Location: Home  Provider Location: Home Office  I discussed the limitations of evaluation and management by telemedicine. The patient expressed understanding and agreed to proceed.  Vital Signs: Because this visit was a virtual/telehealth visit, some criteria may be missing or patient reported. Any vitals not documented were not able to be obtained and vitals that have been documented are patient reported.  VideoDeclined- This patient declined Librarian, academic. Therefore the visit was completed with audio only.  AWV Questionnaire: No: Patient Medicare AWV questionnaire was not completed prior to this visit.  Cardiac Risk Factors include: advanced age (>7men, >59 women);hypertension;male gender     Objective:    Today's Vitals   09/19/23 1547  Weight: 180 lb (81.6 kg)  Height: 5\' 8"  (1.727 m)   Body mass index is 27.37 kg/m.     09/19/2023    3:51 PM 08/07/2023    6:57 PM 05/16/2023    9:40 PM 03/30/2023   11:02 AM 09/12/2022    2:49 PM 07/26/2021    2:46 PM 04/09/2021    5:29 PM  Advanced Directives  Does Patient Have a Medical Advance Directive? No No No No Yes No No  Type of Agricultural consultant;Living will    Copy of Healthcare Power of Attorney in Chart?     No - copy requested    Would patient like information on creating a medical advance directive? Yes (MAU/Ambulatory/Procedural Areas - Information given) No - Patient declined    No - Patient declined No - Patient declined    Current Medications (verified) Outpatient Encounter Medications as of 09/19/2023  Medication Sig   acetaminophen (TYLENOL) 325 MG tablet  Take 2 tablets (650 mg total) by mouth every 6 (six) hours as needed for mild pain, fever or headache (or Fever >/= 101).   allopurinol (ZYLOPRIM) 100 MG tablet Take 1 tablet (100 mg total) by mouth daily.   aspirin 81 MG chewable tablet Chew 1 tablet (81 mg total) by mouth daily with breakfast.   carvedilol (COREG) 12.5 MG tablet Take 1 tablet (12.5 mg total) by mouth 2 (two) times daily.   feeding supplement (ENSURE ENLIVE / ENSURE PLUS) LIQD Take 237 mLs by mouth 2 (two) times daily between meals. (Patient not taking: Reported on 09/19/2023)   fexofenadine (ALLEGRA) 180 MG tablet Take 1 tablet (180 mg total) by mouth daily. For allergy symptoms   finasteride (PROSCAR) 5 MG tablet Take 1 tablet (5 mg total) by mouth daily.   fluticasone (FLONASE) 50 MCG/ACT nasal spray USE 2 SPRAYS IN EACH NOSTRIL ONCE DAILY   furosemide (LASIX) 20 MG tablet Take 3 tablets (60 mg total) by mouth daily.   gabapentin (NEURONTIN) 300 MG capsule Take 1 capsule (300 mg total) by mouth 2 (two) times daily.   hydrALAZINE (APRESOLINE) 25 MG tablet TAKE 1/2 TABLET BY MOUTH TWICE DAILY   hydroquinone 4 % cream APPLY TO AFFECTED AREAS TWICE A DAY (Patient not taking: No sig reported)   isosorbide mononitrate (IMDUR) 30 MG 24 hr tablet TAKE 1/2 TABLET BY MOUTH DAILY   mupirocin ointment (BACTROBAN) 2 % Apply 1 Application topically 2 (two) times  daily. (Patient not taking: Reported on 08/08/2023)   pantoprazole (PROTONIX) 40 MG tablet Take 1 tablet (40 mg total) by mouth daily. TAKE 1 TABLET ONCE DAILY FOR REFLUX   potassium chloride SA (KLOR-CON M) 20 MEQ tablet Take 1 tablet (20 mEq total) by mouth daily.   pravastatin (PRAVACHOL) 40 MG tablet Take 1 tablet (40 mg total) by mouth daily.   sertraline (ZOLOFT) 50 MG tablet Take 1 tablet (50 mg total) by mouth daily.   tamsulosin (FLOMAX) 0.4 MG CAPS capsule TAKE (2) CAPSULES DAILY.   [DISCONTINUED] sulfamethoxazole-trimethoprim (BACTRIM DS) 800-160 MG tablet Take 1 tablet by  mouth 2 (two) times daily. Until gone, for infection (Patient not taking: Reported on 08/08/2023)   No facility-administered encounter medications on file as of 09/19/2023.    Allergies (verified) Neosporin [neomycin-bacitracin zn-polymyx] and Voltaren [diclofenac sodium]   History: Past Medical History:  Diagnosis Date   Acute respiratory failure with hypoxia and hypercapnia (HCC) 07/19/2020   Acute systolic CHF (congestive heart failure) (HCC) 07/19/2020   Arthritis    Combined systolic and diastolic heart failure (HCC)    GERD (gastroesophageal reflux disease)    Hyperlipidemia    Pneumonia    Past Surgical History:  Procedure Laterality Date   HERNIA REPAIR     TONSILLECTOMY     Family History  Problem Relation Age of Onset   Heart disease Mother    Diabetes Mother    Heart failure Mother    COPD Father    Heart attack Brother 57   Social History   Socioeconomic History   Marital status: Single    Spouse name: Not on file   Number of children: 0   Years of education: some college   Highest education level: Some college, no degree  Occupational History   Occupation: Retired    Comment: Probation officer  Tobacco Use   Smoking status: Never   Smokeless tobacco: Never  Vaping Use   Vaping status: Never Used  Substance and Sexual Activity   Alcohol use: No    Alcohol/week: 0.0 standard drinks of alcohol   Drug use: No   Sexual activity: Not Currently  Other Topics Concern   Not on file  Social History Narrative   Not on file   Social Drivers of Health   Financial Resource Strain: Low Risk  (09/19/2023)   Overall Financial Resource Strain (CARDIA)    Difficulty of Paying Living Expenses: Not hard at all  Food Insecurity: No Food Insecurity (09/19/2023)   Hunger Vital Sign    Worried About Running Out of Food in the Last Year: Never true    Ran Out of Food in the Last Year: Never true  Transportation Needs: No Transportation Needs (09/19/2023)   PRAPARE -  Administrator, Civil Service (Medical): No    Lack of Transportation (Non-Medical): No  Physical Activity: Insufficiently Active (09/19/2023)   Exercise Vital Sign    Days of Exercise per Week: 3 days    Minutes of Exercise per Session: 30 min  Stress: No Stress Concern Present (09/19/2023)   [employer] of Occupational Health - Occupational Stress Questionnaire    Feeling of Stress : Not at all  Social Connections: Socially Isolated (09/19/2023)   Social Connection and Isolation Panel [NHANES]    Frequency of Communication with Friends and Family: Once a week    Frequency of Social Gatherings with Friends and Family: Once a week    Attends Religious Services: More than 4  times per year    Active Member of Clubs or Organizations: No    Attends Banker Meetings: Never    Marital Status: Never married    Tobacco Counseling Counseling given: Not Answered    Clinical Intake:  Pre-visit preparation completed: Yes  Pain : No/denies pain     Diabetes: No  How often do you need to have someone help you when you read instructions, pamphlets, or other written materials from your doctor or pharmacy?: 1 - Never  Interpreter Needed?: No  Information entered by :: Kandis Fantasia LPN   Activities of Daily Living     09/19/2023    3:47 PM 08/08/2023    4:06 AM  In your present state of health, do you have any difficulty performing the following activities:  Hearing? 0   Vision? 0   Difficulty concentrating or making decisions? 0   Walking or climbing stairs? 0   Dressing or bathing? 0   Doing errands, shopping? 0 1  Preparing Food and eating ? N   Using the Toilet? N   In the past six months, have you accidently leaked urine? N   Do you have problems with loss of bowel control? N   Managing your Medications? N   Managing your Finances? N   Housekeeping or managing your Housekeeping? N     Patient Care Team: [name], MD as PCP - General  (Family Medicine) Wyline Mood Dorothe Pea, MD as PCP - Cardiology (Cardiology)  Indicate any recent Medical Services you may have received from other than [facility] providers in the past year (date may be approximate).     Assessment:   This is a routine wellness examination for Taylor Cross.  Hearing/Vision screen Hearing Screening - Comments:: Denies hearing difficulties   Vision Screening - Comments:: No vision problems; will schedule routine eye exam soon     Goals Addressed   None    Depression Screen     09/19/2023    3:49 PM 05/09/2023   10:16 AM 11/07/2022    1:49 PM 09/12/2022    2:49 PM 04/19/2022   10:17 AM 10/18/2021   10:31 AM 07/26/2021    2:44 PM  PHQ 2/9 Scores  PHQ - 2 Score 0 0 0 0 0 0 1    Fall Risk     09/19/2023    3:51 PM 05/09/2023   10:16 AM 11/07/2022    1:49 PM 09/12/2022    2:45 PM 04/19/2022   10:17 AM  Fall Risk   Falls in the past year? 1 0 0 0 0  Number falls in past yr: 0   0   Injury with Fall? 0   0   Risk for fall due to : History of fall(s)   No Fall Risks   Follow up Falls prevention discussed;Education provided;Falls evaluation completed   Falls prevention discussed     MEDICARE RISK AT HOME:  Medicare Risk at Home Any stairs in or around the home?: No If so, are there any without handrails?: No Home free of loose throw rugs in walkways, pet beds, electrical cords, etc?: Yes Adequate lighting in your home to reduce risk of falls?: Yes Life alert?: No Use of a cane, walker or w/c?: No Grab bars in the bathroom?: Yes Shower chair or bench in shower?: No Elevated toilet seat or a handicapped toilet?: Yes  TIMED UP AND GO:  Was the test performed?  No  Cognitive Function: 6CIT completed  10/17/2017   11:55 AM  MMSE - Mini Mental State Exam  Orientation to time 5  Orientation to Place 5  Registration 3  Attention/ Calculation 5  Recall 3  Language- name 2 objects 1  Language- repeat 1  Language- follow 3 step command 3  Language-  read & follow direction 1  Copy design 1        09/19/2023    3:52 PM 09/12/2022    2:50 PM 07/26/2021    3:10 PM 07/26/2021    2:50 PM  6CIT Screen  What Year? 0 points 0 points 0 points 0 points  What month? 0 points 0 points 0 points 0 points  What time? 0 points 0 points 0 points 0 points  Count back from 20 0 points 0 points 0 points 0 points  Months in reverse 2 points 0 points 2 points 2 points  Repeat phrase 0 points 0 points 0 points 0 points  Total Score 2 points 0 points 2 points 2 points    Immunizations Immunization History  Administered Date(s) Administered   Fluad Quad(high Dose 65+) 04/16/2019, 04/21/2020, 04/19/2021, 04/19/2022   Fluad Trivalent(High Dose 65+) 05/09/2023   Influenza, Seasonal, Injecte, Preservative Fre 05/08/2018   Influenza,inj,Quad PF,6+ Mos 05/02/2015, 04/29/2017   Influenza,inj,quad, With Preservative 04/24/2016   Influenza-Unspecified 04/29/2013, 05/05/2014, 05/02/2015, 04/24/2016, 05/08/2018   Moderna Sars-Covid-2 Vaccination 09/14/2019, 10/12/2019, 05/31/2020, 02/28/2021   PNEUMOCOCCAL CONJUGATE-20 07/13/2021   Pneumococcal Conjugate-13 04/06/2016   Pneumococcal Polysaccharide-23 04/07/2013   Respiratory Syncytial Virus Vaccine,Recomb Aduvanted(Arexvy) 05/02/2022   Tdap 10/06/2013, 10/30/2017   Zoster Recombinant(Shingrix) 05/08/2018    Screening Tests Health Maintenance  Topic Date Due   Zoster Vaccines- Shingrix (2 of 2) 07/03/2018   COVID-19 Vaccine (5 - 2024-25 season) 03/31/2023   Medicare Annual Wellness (AWV)  09/18/2024   DTaP/Tdap/Td (3 - Td or Tdap) 10/31/2027   Pneumonia Vaccine 1+ Years old  Completed   INFLUENZA VACCINE  Completed   Hepatitis C Screening  Completed   HPV VACCINES  Aged Out    Health Maintenance  Health Maintenance Due  Topic Date Due   Zoster Vaccines- Shingrix (2 of 2) 07/03/2018   COVID-19 Vaccine (5 - 2024-25 season) 03/31/2023   Additional Screening:  Vision Screening: Recommended  annual ophthalmology exams for early detection of glaucoma and other disorders of the eye.  Dental Screening: Recommended annual dental exams for proper oral hygiene  Community Resource Referral / Chronic Care Management: CRR required this visit?  No   CCM required this visit?  No     Plan:     I have personally reviewed and noted the following in the patient's chart:   Medical and social history Use of alcohol, tobacco or illicit drugs  Current medications and supplements including opioid prescriptions. Patient is not currently taking opioid prescriptions. Functional ability and status Nutritional status Physical activity Advanced directives List of other physicians Hospitalizations, surgeries, and ER visits in previous 12 months Vitals Screenings to include cognitive, depression, and falls Referrals and appointments  In addition, I have reviewed and discussed with patient certain preventive protocols, quality metrics, and best practice recommendations. A written personalized care plan for preventive services as well as general preventive health recommendations were provided to patient.     Kandis Fantasia [place], [place]   5/40/9811   After Visit Summary: (MyChart) Due to this being a telephonic visit, the after visit summary with patients personalized plan was offered to patient via MyChart   Notes: Nothing significant to  report at this time.

## 2023-09-19 NOTE — Patient Instructions (Signed)
Taylor Cross , Thank you for taking time to come for your Medicare Wellness Visit. I appreciate your ongoing commitment to your health goals. Please review the following plan we discussed and let me know if I can assist you in the future.   Referrals/Orders/Follow-Ups/Clinician Recommendations: Aim for 30 minutes of exercise or brisk walking, 6-8 glasses of water, and 5 servings of fruits and vegetables each day.  This is a list of the screening recommended for you and due dates:  Health Maintenance  Topic Date Due   Zoster (Shingles) Vaccine (2 of 2) 07/03/2018   COVID-19 Vaccine (5 - 2024-25 season) 03/31/2023   Medicare Annual Wellness Visit  09/18/2024   DTaP/Tdap/Td vaccine (3 - Td or Tdap) 10/31/2027   Pneumonia Vaccine  Completed   Flu Shot  Completed   Hepatitis C Screening  Completed   HPV Vaccine  Aged Out    Advanced directives: (ACP Link)Information on Advanced Care Planning can be found at [facility] Advance Health Care Directives Advance Health Care Directives ([email or web address]   Next Medicare Annual Wellness Visit scheduled for next year: Yes

## 2023-09-24 ENCOUNTER — Encounter: Payer: Self-pay | Admitting: *Deleted

## 2023-09-26 ENCOUNTER — Encounter: Payer: Self-pay | Admitting: Cardiology

## 2023-09-26 ENCOUNTER — Ambulatory Visit: Payer: Medicare Other | Attending: Cardiology | Admitting: Cardiology

## 2023-09-26 VITALS — BP 124/56 | HR 48 | Ht 68.0 in | Wt 183.4 lb

## 2023-09-26 DIAGNOSIS — I4891 Unspecified atrial fibrillation: Secondary | ICD-10-CM | POA: Diagnosis present

## 2023-09-26 DIAGNOSIS — I10 Essential (primary) hypertension: Secondary | ICD-10-CM

## 2023-09-26 DIAGNOSIS — Z79899 Other long term (current) drug therapy: Secondary | ICD-10-CM | POA: Diagnosis present

## 2023-09-26 DIAGNOSIS — I5022 Chronic systolic (congestive) heart failure: Secondary | ICD-10-CM

## 2023-09-26 NOTE — Patient Instructions (Addendum)
 Medication Instructions:   Continue all current medications.   Labwork:  BMET, Mg - orders given Office will contact with results via phone, letter or mychart.     Testing/Procedures:  none  Follow-Up:  6 months   Any Other Special Instructions Will Be Listed Below (If Applicable).   If you need a refill on your cardiac medications before your next appointment, please call your pharmacy.

## 2023-09-26 NOTE — Progress Notes (Signed)
 Clinical Summary Taylor Cross is a 78 y.o.male seen today for follow up of the following medical problems.   1. Chronic Systolic/Diastolic heart failure   - 10/2013 echo which showed LVEF 40-45%, mid-inferolateral wall hypokinesis, and grade II diastolic dysfunction.   - repeat echo 08/2014 LVEF 35-40%, basal to mid inferolateral and inferior walls, grade I diastolic dysfunction - completed lexiscan which showed moderate sized moderate to severe intensity inferolateral wall defect and small apical to mid inferior wall defect. Both areas were fixed and consistent with scar.    - lisinopril  stopped by other provider due to decreasing renal function.    12/2018 echo: LVEF 50-55%  - admitted 06/2020 with CHF exacerbation and pneumonia - echo from 07/20/2020 shows that EF is down to 35 to 40% from 50 to 55% back in June 2020, 08/2020 echo LVEF 40-45%, grade I dd, normal RV  Low bp's and fatigue on coresg 25mg  bid, lowered to 12.5mg  bid   - elevation in Cr, off farxiga and lasix lowered. Cr was up to 1.9, more recently varying 1.5-1.7 - admit Jan 2025 with SOB, fluid overload - Jan 2025 echo:  - was diuresed, his home lasix was increased to 60mg  daily - Jan 2025 echo: LVEF 40-45%, indet diastolic, normal RV - since discharge no recurrent issues with edema, breathing is doing well.  - compliant with meds   2. CKD III - Cr has improved off ACE-I.       3. HL - 09/2021 TC 185 TG 145 HDL 50 LDL 109 03/2022 TC 163 TG 140 HDL 47 LDL 91 10/2022 TC 175 TG 201 HDL 50 LDL 91 04/2023 TC 149 TG 102 HDL 52 LDL 78   4. HTN - compliant with meds     5. Mild aortic stenosis - Jan 2025 echo: LVEF 40-45%, AVA VTI 1.6, mean grad 10.    Past Medical History:  Diagnosis Date   Acute respiratory failure with hypoxia and hypercapnia (HCC) 07/19/2020   Acute systolic CHF (congestive heart failure) (HCC) 07/19/2020   Arthritis    Combined systolic and diastolic heart failure (HCC)    GERD  (gastroesophageal reflux disease)    Hyperlipidemia    Pneumonia      Allergies  Allergen Reactions   Neosporin [Neomycin-Bacitracin Zn-Polymyx] Rash   Voltaren [Diclofenac Sodium] Other (See Comments)    GI upset     Current Outpatient Medications  Medication Sig Dispense Refill   acetaminophen (TYLENOL) 325 MG tablet Take 2 tablets (650 mg total) by mouth every 6 (six) hours as needed for mild pain, fever or headache (or Fever >/= 101). 12 tablet 0   allopurinol (ZYLOPRIM) 100 MG tablet Take 1 tablet (100 mg total) by mouth daily. 90 tablet 3   aspirin 81 MG chewable tablet Chew 1 tablet (81 mg total) by mouth daily with breakfast. 120 tablet 3   carvedilol (COREG) 12.5 MG tablet Take 1 tablet (12.5 mg total) by mouth 2 (two) times daily. 180 tablet 3   feeding supplement (ENSURE ENLIVE / ENSURE PLUS) LIQD Take 237 mLs by mouth 2 (two) times daily between meals. (Patient not taking: Reported on 09/19/2023) 237 mL 12   fexofenadine (ALLEGRA) 180 MG tablet Take 1 tablet (180 mg total) by mouth daily. For allergy symptoms 90 tablet 3   finasteride (PROSCAR) 5 MG tablet Take 1 tablet (5 mg total) by mouth daily. 90 tablet 3   fluticasone (FLONASE) 50 MCG/ACT nasal spray USE 2  SPRAYS IN EACH NOSTRIL ONCE DAILY 16 g 4   furosemide (LASIX) 20 MG tablet Take 3 tablets (60 mg total) by mouth daily. 90 tablet 11   gabapentin (NEURONTIN) 300 MG capsule Take 1 capsule (300 mg total) by mouth 2 (two) times daily. 180 capsule 3   hydrALAZINE (APRESOLINE) 25 MG tablet TAKE 1/2 TABLET BY MOUTH TWICE DAILY 30 tablet 6   hydroquinone 4 % cream APPLY TO AFFECTED AREAS TWICE A DAY (Patient not taking: No sig reported) 28.35 g 0   isosorbide mononitrate (IMDUR) 30 MG 24 hr tablet TAKE 1/2 TABLET BY MOUTH DAILY 45 tablet 2   mupirocin ointment (BACTROBAN) 2 % Apply 1 Application topically 2 (two) times daily. (Patient not taking: Reported on 08/08/2023) 22 g 0   pantoprazole (PROTONIX) 40 MG tablet Take 1  tablet (40 mg total) by mouth daily. TAKE 1 TABLET ONCE DAILY FOR REFLUX 90 tablet 3   potassium chloride SA (KLOR-CON M) 20 MEQ tablet Take 1 tablet (20 mEq total) by mouth daily. 30 tablet 2   pravastatin (PRAVACHOL) 40 MG tablet Take 1 tablet (40 mg total) by mouth daily. 90 tablet 3   sertraline (ZOLOFT) 50 MG tablet Take 1 tablet (50 mg total) by mouth daily. 90 tablet 3   tamsulosin (FLOMAX) 0.4 MG CAPS capsule TAKE (2) CAPSULES DAILY. 180 capsule 3   No current facility-administered medications for this visit.     Past Surgical History:  Procedure Laterality Date   HERNIA REPAIR     TONSILLECTOMY       Allergies  Allergen Reactions   Neosporin [Neomycin-Bacitracin Zn-Polymyx] Rash   Voltaren [Diclofenac Sodium] Other (See Comments)    GI upset      Family History  Problem Relation Age of Onset   Heart disease Mother    Diabetes Mother    Heart failure Mother    COPD Father    Heart attack Brother 32     Social History Taylor Cross reports that he has never smoked. He has never used smokeless tobacco. Taylor Cross reports no history of alcohol use.    Physical Examination Today's Vitals   09/26/23 1603  BP: (!) 124/56  Pulse: (!) 48  SpO2: 95%  Weight: 183 lb 6.4 oz (83.2 kg)  Height: 5\' 8"  (1.727 m)   Body mass index is 27.89 kg/m.   Gen: resting comfortably, no acute distress HEENT: no scleral icterus, pupils equal round and reactive, no palptable cervical adenopathy,  CV: RRR, no mrg, no jvd Resp: Clear to auscultation bilaterally GI: abdomen is soft, non-tender, non-distended, normal bowel sounds, no hepatosplenomegaly MSK: extremities are warm, no edema.  Skin: warm, no rash Neuro:  no focal deficits Psych: appropriate affect   Diagnostic Studies  10/2013 Echo   Left ventricle: The cavity size was mildly dilated. Wall thickness was normal. Systolic function was mildly to moderately reduced. The estimated ejection fraction is 40%. Features  are consistent with a pseudonormal left ventricular filling pattern, with concomitant abnormal relaxation and increased filling pressure (grade 2 diastolic dysfunction). Doppler parameters are consistent with high ventricular filling pressure. - Regional wall motion abnormality: Mild to moderate hypokinesis of the mid inferolateral myocardium. The remaining walls are mildly hypokinetic. - Aortic valve: Trileaflet; mildly thickened, mildly calcified leaflets. There was no stenosis. - Mitral valve: Mildly thickened leaflets . Mild to moderate regurgitation. - Left atrium: The atrium was moderately dilated. - Tricuspid valve: Mild regurgitation. Inadequate TR jet to accurately assess pulmonary pressures. -  Pericardium, extracardiac: There was a left pleural effusion.   11/11/13 Lexiscan MPI   Gated imaging reveals an EDV of 223, ESV of 153, TID ratio 0.99, and LVEF of 31% with global hypokinesis, most prominent in the apex and inferolateral walls.  IMPRESSION: Intermediate risk Lexiscan Cardiolite. No diagnostic ST segment changes were noted to indicate ischemia, frequent PVCs were seen without sustained arrhythmia. Perfusion imaging is most consistent with scar affecting the inferior wall and inferolateral wall as outlined, no large ischemic zones however. LVEF is calculated at 31% with global hypokinesis that is most prominent at the apex and inferior wall. EDV indicates moderate to severe chamber dilatation. Findings are consistent with probable ischemic cardiomyopathy.     08/2014 echo Study Conclusions  - Left ventricle: The cavity size was mildly dilated. Wall   thickness was increased in a pattern of moderate LVH. Systolic   function was moderately reduced. The estimated ejection fraction   was in the range of 35% to 40%. There is hypokinesis to akinesis   of the basal-mid inferolateral and inferior myocardium. Doppler   parameters are consistent with abnormal left  ventricular   relaxation (grade 1 diastolic dysfunction). - Aortic valve: Mildly to moderately calcified annulus. Trileaflet. - Mitral valve: Mildly calcified annulus. There was mild   regurgitation. - Left atrium: The atrium was mildly to moderately dilated. - Right atrium: Central venous pressure (est): 3 mm Hg. - Atrial septum: Possible PFO or secundum ASD with apparent left to   right flow noted by color Doppler. Cannot exclude vigorous venous   return adjacent to septum. Agitated saline study could be   considered for further evaluation. - Tricuspid valve: There was trivial regurgitation. - Pulmonary arteries: PA peak pressure: 17 mm Hg (S). - Pericardium, extracardiac: There was no pericardial effusion.  Recommendations:  Moderate LVH with mild LV chamber dilatation and LVEF approximately 35-40%. Hypokinesis to akinesis of the mid to basal inferolateral/inferior walls noted suggestive of ischemic cardiomyopathy. Grade 1 diastolic dysfunction. Compared to prior study in April 2015, wall motion has improved somewhat and ejection fraction looks to be higher, although graded in similar range last time. Mild to moderate left atrial enlargement. Mild MAC with mild mitral regurgitation. Possible PFO or secundum ASD with apparent left to right flow noted by color Doppler. Cannot exclude vigorous venous return adjacent to septum. Agitated saline study could be considered for further evaluation. Trivial tricuspid regurgitation with PASP normal range.             12/2018 echo 1. The left ventricle has low normal systolic function, with an ejection fraction of 50-55%. The cavity size was normal. There is mildly increased left ventricular wall thickness. Left ventricular diastolic Doppler parameters are consistent with  impaired relaxation. Elevated mean left atrial pressure.  2. The right ventricle has normal systolic function. The cavity was normal. There is no increase in right  ventricular wall thickness.  3. Left atrial size was moderately dilated.  4. No evidence of mitral valve stenosis.  5. The aortic valve is tricuspid. No stenosis of the aortic valve.  6. The aortic root is normal in size and structure.  7. Pulmonary hypertension is indeterminant, inadequate TR jet.     06/2020 echo IMPRESSIONS     1. Left ventricular ejection fraction, by estimation, is 35 to 40%. The  left ventricle has moderately decreased function. The left ventricle  demonstrates regional wall motion abnormalities (see scoring  diagram/findings for description). There is mild  left ventricular  hypertrophy. Left ventricular diastolic parameters are  indeterminate.   2. Right ventricular systolic function is normal. The right ventricular  size is normal. Tricuspid regurgitation signal is inadequate for assessing  PA pressure.   3. Left atrial size was mild to moderately dilated.   4. The mitral valve is grossly normal. Mild mitral valve regurgitation.   5. The aortic valve is tricuspid. There is moderate calcification of the  aortic valve. Aortic valve regurgitation is not visualized. Mild aortic  valve stenosis. Aortic valve mean gradient measures 10.0 mmHg.   6. The inferior vena cava is normal in size with greater than 50%  respiratory variability, suggesting right atrial pressure of 3 mmHg.   7. Left pleural effusion is noted.     08/2020 echo IMPRESSIONS     1. Left ventricular ejection fraction, by estimation, is 40 to 45%. The  left ventricle has mildly decreased function. The left ventricle  demonstrates global hypokinesis. There is mild left ventricular  hypertrophy. Left ventricular diastolic parameters  are consistent with Grade I diastolic dysfunction (impaired relaxation).   2. Right ventricular systolic function is normal. The right ventricular  size is normal.   3. Left atrial size was severely dilated.   4. The mitral valve is normal in structure. Trivial  mitral valve  regurgitation. No evidence of mitral stenosis.   5. The aortic valve is tricuspid. There is mild calcification of the  aortic valve. There is mild thickening of the aortic valve. Aortic valve  regurgitation is not visualized. No aortic stenosis is present.   6. The inferior vena cava is normal in size with greater than 50%  respiratory variability, suggesting right atrial pressure of 3 mmHg.      Jan 2025 echo 1. Left ventricular ejection fraction, by estimation, is 40 to 45%. The  left ventricle has mildly decreased function. The left ventricle  demonstrates global hypokinesis. There is mild left ventricular  hypertrophy. Left ventricular diastolic parameters  are indeterminate.   2. Right ventricular systolic function is normal. The right ventricular  size is normal. Tricuspid regurgitation signal is inadequate for assessing  PA pressure.   3. Left atrial size was moderately dilated.   4. Moderate pleural effusion in the left lateral region.   5. The mitral valve is abnormal. Mild mitral valve regurgitation. No  evidence of mitral stenosis.   6. The aortic valve has an indeterminant number of cusps. There is mild  calcification of the aortic valve. There is mild thickening of the aortic  valve. Aortic valve regurgitation is not visualized. Mild aortic valve  stenosis. Aortic valve area, by VTI  measures 1.62 cm. Aortic valve mean gradient measures 10.0 mmHg. Aortic  valve Vmax measures 1.99 m/s.     Assessment and Plan  1. Chronic Systolic/diastolic heart failure -Medical therapy has been limited due to orthostatic dizziness, renal dysfunction - history of CKD, prior AKI on ACEI has not been on ACE/ARB/ARNI. AKI on farxiga, was discontinued. Given CKD and very sensitive renal function to meds have not tried aldactone. -euvolemic today, will continue current meds. Recheck bmet/mg with recent increase in lasix dosing.    2. HTN - he is at goal, continue current  meds     3. Hyperlipidemia - he is at goal, continue current meds   4. Afib - this was not mentioned in the discharge summary from recent admission. From review after our clinic visit EKG and also tele strips were reviewed that showed afib.  - will  have patient come for nursing visit for EKG to see if still in afib, consider anticoagulation for him at that nursing visit         Antoine Poche, M.D.

## 2023-10-01 ENCOUNTER — Ambulatory Visit: Payer: Self-pay | Admitting: Family Medicine

## 2023-10-01 NOTE — Telephone Encounter (Signed)
 Copied from CRM [id number]. Topic: Clinical - Red Word Triage >> Oct 01, 2023 12:31 PM Kristie Cowman wrote: Red Word that prompted transfer to Nurse Triage: The patient has had a toothache, called dentist, sent in antibiotic (Amoxicillin) 500 mg and is also taking Tylenol and had a few Tramadol leftover and took those.    Thinks medication is effecting his bowel movements.  Patient constipated for the last few days.   Chief Complaint: no BM in 3-4 days  Symptoms: has the urge but nothing comes out Frequency: 4 days Pertinent Negatives: Patient denies abd pain, blood Disposition: [] ED /[] Urgent Care (no appt availability in office) / [] Appointment(In office/virtual)/ []  [facility]/ [x] Home Care/ [] Refused Recommended Disposition /[] [facility]/ []  Follow-up with PCP Additional Notes: pt drinking Ensure which and cause constipation in some people  Reason for Disposition  MILD constipation  Answer Assessment - Initial Assessment Questions 1. STOOL PATTERN OR FREQUENCY: "How often do you have a bowel movement (BM)?"  (Normal range: 3 times a day to every 3 days)  "When was your last BM?"       Last BM 4 days  2. STRAINING: "Do you have to strain to have a BM?"      yes 3. RECTAL PAIN: "Does your rectum hurt when the stool comes out?" If Yes, ask: "Do you have hemorrhoids? How bad is the pain?"  (Scale 1-10; or mild, moderate, severe)     no 4. STOOL COMPOSITION: "Are the stools hard?"      N/A  5. BLOOD ON STOOLS: "Has there been any blood on the toilet tissue or on the surface of the BM?" If Yes, ask: "When was the last time?"     no 6. CHRONIC CONSTIPATION: "Is this a new problem for you?"  If No, ask: "How long have you had this problem?" (days, weeks, months)      No/yes 7. CHANGES IN DIET OR HYDRATION: "Have there been any recent changes in your diet?" "How much fluids are you drinking on a daily basis?"  "How much have you had to drink today?"     Drinking Ensure   8. MEDICINES: "Have you been taking any new medicines?" "Are you taking any narcotic pain medicines?" (e.g., Dilaudid, morphine, Percocet, Vicodin)     Tramadol 9. LAXATIVES: "Have you been using any stool softeners, laxatives, or enemas?"  If Yes, ask "What, how often, and when was the last time?"     Tried mineral oil 10. ACTIVITY:  "How much walking do you do every day?"  "Has your activity level decreased in the past week?"        *No Answer* 11. CAUSE: "What do you think is causing the constipation?"        tramadol 12. OTHER SYMPTOMS: "Do you have any other symptoms?" (e.g., abdomen pain, bloating, fever, vomiting)       no  Protocols used: Constipation-A-AH

## 2023-10-09 ENCOUNTER — Other Ambulatory Visit

## 2023-10-09 DIAGNOSIS — E782 Mixed hyperlipidemia: Secondary | ICD-10-CM

## 2023-10-09 DIAGNOSIS — I10 Essential (primary) hypertension: Secondary | ICD-10-CM

## 2023-10-10 LAB — BASIC METABOLIC PANEL
BUN/Creatinine Ratio: 20 (ref 10–24)
BUN: 32 mg/dL — ABNORMAL HIGH (ref 8–27)
CO2: 21 mmol/L (ref 20–29)
Calcium: 9.1 mg/dL (ref 8.6–10.2)
Chloride: 97 mmol/L (ref 96–106)
Creatinine, Ser: 1.61 mg/dL — ABNORMAL HIGH (ref 0.76–1.27)
Glucose: 106 mg/dL — ABNORMAL HIGH (ref 70–99)
Potassium: 4.8 mmol/L (ref 3.5–5.2)
Sodium: 137 mmol/L (ref 134–144)
eGFR: 44 mL/min/{1.73_m2} — ABNORMAL LOW (ref >59)

## 2023-10-10 LAB — MAGNESIUM: Magnesium: 2.2 mg/dL (ref 1.6–2.3)

## 2023-10-16 ENCOUNTER — Ambulatory Visit: Payer: Medicare Other | Attending: Cardiology

## 2023-10-16 ENCOUNTER — Telehealth: Payer: Self-pay | Admitting: Cardiology

## 2023-10-16 VITALS — HR 51

## 2023-10-16 DIAGNOSIS — I4891 Unspecified atrial fibrillation: Secondary | ICD-10-CM | POA: Diagnosis not present

## 2023-10-16 MED ORDER — APIXABAN 5 MG PO TABS: 5.0000 mg | ORAL_TABLET | Freq: Two times a day (BID) | ORAL | 11 refills | Status: AC

## 2023-10-16 NOTE — Telephone Encounter (Signed)
 Patient presents for nursing visit. Noted to be in afib after reviewing EKGs from last admission, repeat EKG today shows remains in afib, rates in 50s. Has not been symptomatic. Already on beta blocker for his history of HFrEF, looks to be controlling his afib rates. CHADS2Vasc score is 4 (CHF, HTN, age x 2), we will stop his home ASA and start eliquis 5mg  bid.   Dina Rich MD

## 2023-10-16 NOTE — Patient Instructions (Signed)
 Medication Instructions:  Your physician has recommended you make the following change in your medication:   -Stop Aspirin  -Start Eliquis 5 mg once daily   *If you need a refill on your cardiac medications before your next appointment, please call your pharmacy*   Lab Work: None If you have labs (blood work) drawn today and your tests are completely normal, you will receive your results only by: MyChart Message (if you have MyChart) OR A paper copy in the mail If you have any lab test that is abnormal or we need to change your treatment, we will call you to review the results.   Testing/Procedures: None   Follow-Up: At [facility], you and your health needs are our priority.  As part of our continuing mission to provide you with exceptional heart care, we have created designated Provider Care Teams.  These Care Teams include your primary Cardiologist (physician) and Advanced Practice Providers (APPs -  Physician Assistants and Nurse Practitioners) who all work together to provide you with the care you need, when you need it.  We recommend signing up for the patient portal called "MyChart".  Sign up information is provided on this After Visit Summary.  MyChart is used to connect with patients for Virtual Visits (Telemedicine).  Patients are able to view lab/test results, encounter notes, upcoming appointments, etc.  Non-urgent messages can be sent to your provider as well.   To learn more about what you can do with MyChart, go to [email or web address].    Your next appointment:    Keep scheduled follow up

## 2023-10-16 NOTE — Progress Notes (Signed)
 Patient presents today for nurse visit- EKG. Pt stated that he has not felt any different than he normally does.    HR in office today: 51 bpm.  Will route to provider to review.

## 2023-10-16 NOTE — Addendum Note (Signed)
 Addended by: Roseanne Reno on: 10/16/2023 09:22 AM   Modules accepted: Orders

## 2023-11-07 ENCOUNTER — Other Ambulatory Visit: Payer: Self-pay | Admitting: Family Medicine

## 2023-11-07 ENCOUNTER — Encounter: Payer: Self-pay | Admitting: Family Medicine

## 2023-11-07 ENCOUNTER — Ambulatory Visit (INDEPENDENT_AMBULATORY_CARE_PROVIDER_SITE_OTHER): Payer: Medicare Other | Admitting: Family Medicine

## 2023-11-07 ENCOUNTER — Encounter: Payer: Self-pay | Admitting: *Deleted

## 2023-11-07 VITALS — BP 127/74 | HR 63 | Temp 97.9°F | Ht 68.0 in | Wt 192.0 lb

## 2023-11-07 DIAGNOSIS — I10 Essential (primary) hypertension: Secondary | ICD-10-CM | POA: Diagnosis not present

## 2023-11-07 DIAGNOSIS — N401 Enlarged prostate with lower urinary tract symptoms: Secondary | ICD-10-CM

## 2023-11-07 DIAGNOSIS — N183 Chronic kidney disease, stage 3 unspecified: Secondary | ICD-10-CM | POA: Diagnosis not present

## 2023-11-07 DIAGNOSIS — K2100 Gastro-esophageal reflux disease with esophagitis, without bleeding: Secondary | ICD-10-CM | POA: Diagnosis not present

## 2023-11-07 DIAGNOSIS — E782 Mixed hyperlipidemia: Secondary | ICD-10-CM

## 2023-11-07 DIAGNOSIS — E559 Vitamin D deficiency, unspecified: Secondary | ICD-10-CM

## 2023-11-07 DIAGNOSIS — R351 Nocturia: Secondary | ICD-10-CM

## 2023-11-07 DIAGNOSIS — N400 Enlarged prostate without lower urinary tract symptoms: Secondary | ICD-10-CM

## 2023-11-07 DIAGNOSIS — I480 Paroxysmal atrial fibrillation: Secondary | ICD-10-CM

## 2023-11-07 LAB — LIPID PANEL

## 2023-11-07 MED ORDER — GABAPENTIN 300 MG PO CAPS: 300.0000 mg | ORAL_CAPSULE | Freq: Two times a day (BID) | ORAL | 3 refills | Status: AC

## 2023-11-07 MED ORDER — POTASSIUM CHLORIDE CRYS ER 10 MEQ PO TBCR: 20.0000 meq | EXTENDED_RELEASE_TABLET | Freq: Every day | ORAL | 3 refills | Status: AC

## 2023-11-07 MED ORDER — PANTOPRAZOLE SODIUM 40 MG PO TBEC: 40.0000 mg | DELAYED_RELEASE_TABLET | Freq: Every day | ORAL | 3 refills | Status: AC

## 2023-11-07 MED ORDER — ALLOPURINOL 100 MG PO TABS: 100.0000 mg | ORAL_TABLET | Freq: Every day | ORAL | 3 refills | Status: AC

## 2023-11-07 MED ORDER — SERTRALINE HCL 50 MG PO TABS: 50.0000 mg | ORAL_TABLET | Freq: Every day | ORAL | 3 refills | Status: AC

## 2023-11-07 MED ORDER — FINASTERIDE 5 MG PO TABS
5.0000 mg | ORAL_TABLET | Freq: Every day | ORAL | 3 refills | Status: AC
Start: 2023-11-07 — End: ?

## 2023-11-07 NOTE — Progress Notes (Signed)
 Subjective:  Patient ID: Taylor Cross, male    DOB: 1945-09-03  Age: 78 y.o. MRN: [id number]  CC: Medical Management of Chronic Issues (Refills pended) and Choking (Trouble sometimes when eating. Has to cut pills in half. )   HPI Farris D Goethe presents for  follow-up of hypertension. Patient has no history of headache chest pain or shortness of breath or recent cough. Patient also denies symptoms of TIA such as focal numbness or weakness. Patient denies side effects from medication. States taking it regularly.  Patient in for follow-up of GERD. Currently asymptomatic taking  PPI daily. There is no chest pain or heartburn. No hematemesis and no melena. No dysphagia or choking. Onset is remote. Progression is stable. Complicating factors, none.  Gout - no outbreak recently.   BPH- no frequency or nocturia - maybe 1-2 times   Atrial fibrillation follow up. Pt. is treated with rate control and anticoagulation. Pt.  denies palpitations, rapid rate, chest pain, dyspnea and edema. There has been no bleeding from nose or gums. Pt. has not noticed blood with urine or stool.  Although there is routine bruising easily, it is not excessive.     History Syrus has a past medical history of Acute respiratory failure with hypoxia and hypercapnia (HCC) (07/19/2020), Acute systolic CHF (congestive heart failure) (HCC) (07/19/2020), Arthritis, Combined systolic and diastolic heart failure (HCC), GERD (gastroesophageal reflux disease), Hyperlipidemia, and Pneumonia.   He has a past surgical history that includes Tonsillectomy and Hernia repair.   His family history includes COPD in his father; Diabetes in his mother; Heart attack (age of onset: 22) in his brother; Heart disease in his mother; Heart failure in his mother.He reports that he has never smoked. He has never used smokeless tobacco. He reports that he does not drink alcohol and does not use drugs.  Current Outpatient Medications on File Prior to  Visit  Medication Sig Dispense Refill   acetaminophen (TYLENOL) 325 MG tablet Take 2 tablets (650 mg total) by mouth every 6 (six) hours as needed for mild pain, fever or headache (or Fever >/= 101). 12 tablet 0   apixaban (ELIQUIS) 5 MG TABS tablet Take 1 tablet (5 mg total) by mouth 2 (two) times daily. 60 tablet 11   carvedilol (COREG) 12.5 MG tablet Take 1 tablet (12.5 mg total) by mouth 2 (two) times daily. 180 tablet 3   feeding supplement (ENSURE ENLIVE / ENSURE PLUS) LIQD Take 237 mLs by mouth 2 (two) times daily between meals. 237 mL 12   fluticasone (FLONASE) 50 MCG/ACT nasal spray USE 2 SPRAYS IN EACH NOSTRIL ONCE DAILY 16 g 4   furosemide (LASIX) 20 MG tablet Take 3 tablets (60 mg total) by mouth daily. 90 tablet 11   hydrALAZINE (APRESOLINE) 25 MG tablet TAKE 1/2 TABLET BY MOUTH TWICE DAILY 30 tablet 6   isosorbide mononitrate (IMDUR) 30 MG 24 hr tablet TAKE 1/2 TABLET BY MOUTH DAILY 45 tablet 2   pravastatin (PRAVACHOL) 40 MG tablet Take 1 tablet (40 mg total) by mouth daily. 90 tablet 3   tamsulosin (FLOMAX) 0.4 MG CAPS capsule TAKE (2) CAPSULES DAILY. 180 capsule 3   No current facility-administered medications on file prior to visit.    ROS Review of Systems  Objective:  BP 127/74   Pulse 63   Temp 97.9 F (36.6 C)   Ht 5\' 8"  (1.727 m)   Wt 192 lb (87.1 kg)   SpO2 96%   BMI 29.19 kg/m  BP Readings from Last 3 Encounters:  11/07/23 127/74  09/26/23 (!) 124/56  08/09/23 125/70    Wt Readings from Last 3 Encounters:  11/07/23 192 lb (87.1 kg)  09/26/23 183 lb 6.4 oz (83.2 kg)  09/19/23 180 lb (81.6 kg)     Physical Exam Vitals reviewed.  Constitutional:      Appearance: He is well-developed.  HENT:     Head: Normocephalic and atraumatic.     Right Ear: External ear normal.     Left Ear: External ear normal.     Mouth/Throat:     Pharynx: No oropharyngeal exudate or posterior oropharyngeal erythema.  Eyes:     Pupils: Pupils are equal, round, and  reactive to light.  Cardiovascular:     Rate and Rhythm: Normal rate and regular rhythm.     Heart sounds: No murmur heard. Pulmonary:     Effort: No respiratory distress.     Breath sounds: Normal breath sounds.  Musculoskeletal:     Cervical back: Normal range of motion and neck supple.  Neurological:     Mental Status: He is alert and oriented to person, place, and time.       Assessment & Plan:  Essential hypertension -     CBC with Differential/Platelet -     CMP14+EGFR  Benign prostatic hyperplasia with nocturia -     Finasteride; Take 1 tablet (5 mg total) by mouth daily.  Dispense: 90 tablet; Refill: 3 -     CBC with Differential/Platelet -     CMP14+EGFR -     Uric acid  Gastroesophageal reflux disease with esophagitis without hemorrhage -     Pantoprazole Sodium; Take 1 tablet (40 mg total) by mouth daily. TAKE 1 TABLET ONCE DAILY FOR REFLUX  Dispense: 90 tablet; Refill: 3 -     CBC with Differential/Platelet -     CMP14+EGFR -     Lipid panel -     PSA, total and free -     Uric acid -     VITAMIN D 25 Hydroxy (Vit-D Deficiency, Fractures)  Chronic renal impairment, stage 3 (moderate), unspecified whether stage 3a or 3b CKD (HCC) -     Allopurinol; Take 1 tablet (100 mg total) by mouth daily.  Dispense: 90 tablet; Refill: 3 -     CBC with Differential/Platelet -     CMP14+EGFR -     Lipid panel -     PSA, total and free -     Uric acid -     VITAMIN D 25 Hydroxy (Vit-D Deficiency, Fractures)  Paroxysmal atrial fibrillation (HCC) -     TSH  Benign prostatic hyperplasia without lower urinary tract symptoms -     PSA, total and free  Mixed hyperlipidemia -     CBC with Differential/Platelet -     CMP14+EGFR -     Lipid panel  Vitamin D deficiency -     VITAMIN D 25 Hydroxy (Vit-D Deficiency, Fractures)  Other orders -     Sertraline HCl; Take 1 tablet (50 mg total) by mouth daily.  Dispense: 90 tablet; Refill: 3 -     Gabapentin; Take 1 capsule  (300 mg total) by mouth 2 (two) times daily.  Dispense: 180 capsule; Refill: 3 -     Potassium Chloride Crys ER; Take 2 tablets (20 mEq total) by mouth daily.  Dispense: 180 tablet; Refill: 3    Allergies as of 11/07/2023  Reactions   Neosporin [neomycin-bacitracin Zn-polymyx] Rash   Voltaren [diclofenac Sodium] Other (See Comments)   GI upset        Medication List        Accurate as of November 07, 2023 11:04 AM. If you have any questions, ask your nurse or doctor.          acetaminophen 325 MG tablet Commonly known as: TYLENOL Take 2 tablets (650 mg total) by mouth every 6 (six) hours as needed for mild pain, fever or headache (or Fever >/= 101).   allopurinol 100 MG tablet Commonly known as: ZYLOPRIM Take 1 tablet (100 mg total) by mouth daily.   apixaban 5 MG Tabs tablet Commonly known as: ELIQUIS Take 1 tablet (5 mg total) by mouth 2 (two) times daily.   carvedilol 12.5 MG tablet Commonly known as: COREG Take 1 tablet (12.5 mg total) by mouth 2 (two) times daily.   feeding supplement Liqd Take 237 mLs by mouth 2 (two) times daily between meals.   finasteride 5 MG tablet Commonly known as: PROSCAR Take 1 tablet (5 mg total) by mouth daily.   fluticasone 50 MCG/ACT nasal spray Commonly known as: FLONASE USE 2 SPRAYS IN EACH NOSTRIL ONCE DAILY   furosemide 20 MG tablet Commonly known as: Lasix Take 3 tablets (60 mg total) by mouth daily.   gabapentin 300 MG capsule Commonly known as: NEURONTIN Take 1 capsule (300 mg total) by mouth 2 (two) times daily.   hydrALAZINE 25 MG tablet Commonly known as: APRESOLINE TAKE 1/2 TABLET BY MOUTH TWICE DAILY   isosorbide mononitrate 30 MG 24 hr tablet Commonly known as: IMDUR TAKE 1/2 TABLET BY MOUTH DAILY   pantoprazole 40 MG tablet Commonly known as: PROTONIX Take 1 tablet (40 mg total) by mouth daily. TAKE 1 TABLET ONCE DAILY FOR REFLUX   potassium chloride 10 MEQ tablet Commonly known as: KLOR-CON  M Take 2 tablets (20 mEq total) by mouth daily. What changed: medication strength Changed by: Carah Barrientes   pravastatin 40 MG tablet Commonly known as: PRAVACHOL Take 1 tablet (40 mg total) by mouth daily.   sertraline 50 MG tablet Commonly known as: ZOLOFT Take 1 tablet (50 mg total) by mouth daily.   tamsulosin 0.4 MG Caps capsule Commonly known as: FLOMAX TAKE (2) CAPSULES DAILY.         Follow-up: Return in about 6 months (around 05/08/2024) for [facility]ete physical.  [name], M.D.

## 2023-11-08 LAB — CBC WITH DIFFERENTIAL/PLATELET
Basophils Absolute: 0 10*3/uL (ref 0.0–0.2)
Basos: 1 %
EOS (ABSOLUTE): 0.2 10*3/uL (ref 0.0–0.4)
Eos: 3 %
Hematocrit: 40.9 % (ref 37.5–51.0)
Hemoglobin: 13.4 g/dL (ref 13.0–17.7)
Immature Grans (Abs): 0 10*3/uL (ref 0.0–0.1)
Immature Granulocytes: 1 %
Lymphocytes Absolute: 1.4 10*3/uL (ref 0.7–3.1)
Lymphs: 23 %
MCH: 32.8 pg (ref 26.6–33.0)
MCHC: 32.8 g/dL (ref 31.5–35.7)
MCV: 100 fL — ABNORMAL HIGH (ref 79–97)
Monocytes Absolute: 0.7 10*3/uL (ref 0.1–0.9)
Monocytes: 11 %
Neutrophils Absolute: 3.9 10*3/uL (ref 1.4–7.0)
Neutrophils: 61 %
Platelets: 205 10*3/uL (ref 150–450)
RBC: 4.08 x10E6/uL — ABNORMAL LOW (ref 4.14–5.80)
RDW: 13.9 % (ref 11.6–15.4)
WBC: 6.3 10*3/uL (ref 3.4–10.8)

## 2023-11-08 LAB — LIPID PANEL
Cholesterol, Total: 147 mg/dL (ref 100–199)
HDL: 47 mg/dL (ref >39)
LDL CALC COMMENT:: 3.1 ratio (ref 0.0–5.0)
LDL Chol Calc (NIH): 80 mg/dL (ref 0–99)
Triglycerides: 109 mg/dL (ref 0–149)
VLDL Cholesterol Cal: 20 mg/dL (ref 5–40)

## 2023-11-08 LAB — CMP14+EGFR
ALT: 16 IU/L (ref 0–44)
AST: 19 IU/L (ref 0–40)
Albumin: 4.1 g/dL (ref 3.8–4.8)
Alkaline Phosphatase: 104 IU/L (ref 44–121)
BUN/Creatinine Ratio: 16 (ref 10–24)
BUN: 25 mg/dL (ref 8–27)
Bilirubin Total: 0.7 mg/dL (ref 0.0–1.2)
CO2: 22 mmol/L (ref 20–29)
Calcium: 9.2 mg/dL (ref 8.6–10.2)
Chloride: 98 mmol/L (ref 96–106)
Creatinine, Ser: 1.54 mg/dL — ABNORMAL HIGH (ref 0.76–1.27)
Globulin, Total: 2.6 g/dL (ref 1.5–4.5)
Glucose: 101 mg/dL — ABNORMAL HIGH (ref 70–99)
Potassium: 4.7 mmol/L (ref 3.5–5.2)
Sodium: 137 mmol/L (ref 134–144)
Total Protein: 6.7 g/dL (ref 6.0–8.5)
eGFR: 46 mL/min/{1.73_m2} — ABNORMAL LOW (ref >59)

## 2023-11-08 LAB — TSH: TSH: 2.68 u[IU]/mL (ref 0.450–4.500)

## 2023-11-08 LAB — PSA, TOTAL AND FREE
PSA, Free Pct: 21.4 %
PSA, Free: 0.15 ng/mL
Prostate Specific Ag, Serum: 0.7 ng/mL (ref 0.0–4.0)

## 2023-11-08 LAB — VITAMIN D 25 HYDROXY (VIT D DEFICIENCY, FRACTURES): Vit D, 25-Hydroxy: 46.8 ng/mL (ref 30.0–100.0)

## 2023-11-08 LAB — URIC ACID: Uric Acid: 6.8 mg/dL (ref 3.8–8.4)

## 2023-11-10 ENCOUNTER — Encounter: Payer: Self-pay | Admitting: Family Medicine

## 2023-11-10 NOTE — Progress Notes (Signed)
Hello Miraj,  Your lab result is normal and/or stable.Some minor variations that are not significant are commonly marked abnormal, but do not represent any medical problem for you.  Best regards, Minta Fair, M.D.

## 2023-11-11 ENCOUNTER — Other Ambulatory Visit: Payer: Self-pay | Admitting: Cardiology

## 2023-12-02 ENCOUNTER — Other Ambulatory Visit: Payer: Self-pay | Admitting: Family Medicine

## 2023-12-02 DIAGNOSIS — I5189 Other ill-defined heart diseases: Secondary | ICD-10-CM

## 2023-12-02 DIAGNOSIS — I10 Essential (primary) hypertension: Secondary | ICD-10-CM

## 2023-12-09 ENCOUNTER — Other Ambulatory Visit: Payer: Self-pay | Admitting: Cardiology

## 2023-12-24 ENCOUNTER — Other Ambulatory Visit: Payer: Self-pay | Admitting: Family Medicine

## 2024-02-10 ENCOUNTER — Other Ambulatory Visit: Payer: Self-pay | Admitting: Family Medicine

## 2024-03-02 ENCOUNTER — Other Ambulatory Visit: Payer: Self-pay | Admitting: Family Medicine

## 2024-03-02 DIAGNOSIS — I10 Essential (primary) hypertension: Secondary | ICD-10-CM

## 2024-03-02 DIAGNOSIS — I5189 Other ill-defined heart diseases: Secondary | ICD-10-CM

## 2024-04-08 ENCOUNTER — Encounter: Payer: Self-pay | Admitting: Family Medicine

## 2024-04-08 ENCOUNTER — Ambulatory Visit (INDEPENDENT_AMBULATORY_CARE_PROVIDER_SITE_OTHER): Admitting: Family Medicine

## 2024-04-08 VITALS — BP 136/69 | HR 57 | Temp 98.0°F | Ht 68.0 in | Wt 200.0 lb

## 2024-04-08 DIAGNOSIS — J432 Centrilobular emphysema: Secondary | ICD-10-CM

## 2024-04-08 DIAGNOSIS — Z23 Encounter for immunization: Secondary | ICD-10-CM

## 2024-04-08 DIAGNOSIS — N401 Enlarged prostate with lower urinary tract symptoms: Secondary | ICD-10-CM

## 2024-04-08 DIAGNOSIS — I480 Paroxysmal atrial fibrillation: Secondary | ICD-10-CM

## 2024-04-08 DIAGNOSIS — I10 Essential (primary) hypertension: Secondary | ICD-10-CM

## 2024-04-08 DIAGNOSIS — K2100 Gastro-esophageal reflux disease with esophagitis, without bleeding: Secondary | ICD-10-CM

## 2024-04-08 DIAGNOSIS — E782 Mixed hyperlipidemia: Secondary | ICD-10-CM

## 2024-04-08 DIAGNOSIS — R3911 Hesitancy of micturition: Secondary | ICD-10-CM

## 2024-04-08 LAB — LIPID PANEL

## 2024-04-08 NOTE — Progress Notes (Signed)
 "  Subjective:  Patient ID: Taylor Cross, male    DOB: Apr 09, 1946  Age: 78 y.o. MRN: [id number]  CC: Medical Management of Chronic Issues (/)   HPI  Discussed the use of AI scribe software for clinical note transcription with the patient, who gave verbal consent to proceed.  History of Present Illness Taylor Cross is a 78 year old male who presents for prostate cancer screening and COVID vaccination.  He is concerned about prostate cancer due to a family history, as two of his first cousins' husbands have been diagnosed with prostate cancer in the last eight months. He denies nocturia, stating he gets up once a night to urinate. He notes increased daytime frequency, difficulty initiating urination, and a weaker urinary stream compared to the past.  He is seeking a prescription for a COVID-19 vaccination due to his heart condition and age. He has been receiving the COVID vaccine annually without issues but now requires a prescription due to new regulations.  He has a history of a heart condition and is currently taking apixaban  twice daily, carvedilol  twice daily, and furosemide , sometimes taking two or three tablets daily as needed. He did not take apixaban  today due to blood work. No heart palpitations or chest pain are reported, and he notes his heart rate is slow, sometimes as low as 48 beats per minute.  He experiences joint pain, particularly in the mornings, which improves throughout the day. He has been taken off arthritis medications due to kidney concerns. He reports difficulty swallowing pills, often cutting them into smaller pieces. No heartburn but has trouble swallowing pills, particularly larger ones.          04/08/2024   11:32 AM 11/07/2023   10:31 AM 09/19/2023    3:49 PM  Depression screen PHQ 2/9  Decreased Interest 0 0 0  Down, Depressed, Hopeless 0 0 0  PHQ - 2 Score 0 0 0  Altered sleeping 0 0   Tired, decreased energy 0 0   Change in appetite 0 0   Feeling  bad or failure about yourself  0 0   Trouble concentrating 0 0   Moving slowly or fidgety/restless 0 0   Suicidal thoughts 0 0   PHQ-9 Score 0 0   Difficult doing work/chores Not difficult at all      History Taylor Cross has a past medical history of Acute respiratory failure with hypoxia and hypercapnia (HCC) (07/19/2020), Acute systolic CHF (congestive heart failure) (HCC) (07/19/2020), Arthritis, Combined systolic and diastolic heart failure (HCC), GERD (gastroesophageal reflux disease), Hyperlipidemia, and Pneumonia.   He has a past surgical history that includes Tonsillectomy and Hernia repair.   His family history includes COPD in his father; Diabetes in his mother; Heart attack (age of onset: 59) in his brother; Heart disease in his mother; Heart failure in his mother.He reports that he has never smoked. He has never used smokeless tobacco. He reports that he does not drink alcohol  and does not use drugs.    ROS Review of Systems  Objective:  BP 136/69   Pulse (!) 57   Temp 98 F (36.7 C)   Ht 5' 8 (1.727 m)   Wt 200 lb (90.7 kg)   SpO2 96%   BMI 30.41 kg/m   BP Readings from Last 3 Encounters:  04/08/24 136/69  11/07/23 127/74  09/26/23 (!) 124/56    Wt Readings from Last 3 Encounters:  04/08/24 200 lb (90.7 kg)  11/07/23 192 lb (87.1  kg)  09/26/23 183 lb 6.4 oz (83.2 kg)     Physical Exam   Assessment & Plan:  Paroxysmal atrial fibrillation (HCC) -     CBC with Differential/Platelet -     CMP14+EGFR  Encounter for immunization -     Flu vaccine HIGH DOSE PF(Fluzone Trivalent)  Benign prostatic hyperplasia with urinary hesitancy -     PSA, total and free -     CBC with Differential/Platelet -     CMP14+EGFR  Centrilobular emphysema (HCC) -     CBC with Differential/Platelet -     CMP14+EGFR  Essential hypertension -     CBC with Differential/Platelet -     CMP14+EGFR  Mixed hyperlipidemia -     CBC with Differential/Platelet -     CMP14+EGFR -      Lipid panel  Gastroesophageal reflux disease with esophagitis without hemorrhage -     CBC with Differential/Platelet -     CMP14+EGFR    Assessment and Plan Assessment & Plan Benign prostatic hyperplasia with lower urinary tract symptoms   He reports nocturia once per night, increased daytime frequency, hesitancy, and decreased stream strength, consistent with benign prostatic hyperplasia. Order prostate blood test as part of blood work.  Heart disease with chronic kidney disease   He is on apixaban , carvedilol , and furosemide  for heart disease management. He reports no current heart palpitations or chest pain. Heart rate is well-controlled with carvedilol , occasionally dropping to 48 bpm. He is aware of the need to monitor for dizziness or weakness. He adjusts furosemide  dose between two and three tablets daily based on needs. Chronic kidney disease is a concern, so NSAIDs are avoided to protect kidney function. Continue apixaban , carvedilol , and furosemide  as prescribed. Monitor heart rate and symptoms of dizziness or weakness. Order blood work to check kidney function.  Arthralgia   He experiences joint pain, particularly in the mornings, which improves throughout the day. Advised against NSAIDs due to kidney concerns. Recommend Osteobiflex, a supplement containing chondroitin, glucosamine, and MSM, for joint pain management as it is less likely to affect kidney function.  Dysphagia   He reports difficulty swallowing pills, often needing to cut them into smaller pieces. Advise him to consult with a pharmacist about which medications can be safely split.       Follow-up: Return in about 6 months (around 10/06/2024) for hypertension.  Taylor Cross, M.D. "

## 2024-04-09 LAB — LIPID PANEL
Cholesterol, Total: 146 mg/dL (ref 100–199)
HDL: 53 mg/dL (ref >39)
LDL CALC COMMENT:: 2.8 ratio (ref 0.0–5.0)
LDL Chol Calc (NIH): 77 mg/dL (ref 0–99)
Triglycerides: 82 mg/dL (ref 0–149)
VLDL Cholesterol Cal: 16 mg/dL (ref 5–40)

## 2024-04-09 LAB — CBC WITH DIFFERENTIAL/PLATELET
Basophils Absolute: 0 x10E3/uL (ref 0.0–0.2)
Basos: 0 %
EOS (ABSOLUTE): 0.2 x10E3/uL (ref 0.0–0.4)
Eos: 3 %
Hematocrit: 42.4 % (ref 37.5–51.0)
Hemoglobin: 14.3 g/dL (ref 13.0–17.7)
Immature Grans (Abs): 0 x10E3/uL (ref 0.0–0.1)
Immature Granulocytes: 0 %
Lymphocytes Absolute: 1.2 x10E3/uL (ref 0.7–3.1)
Lymphs: 22 %
MCH: 34.5 pg — ABNORMAL HIGH (ref 26.6–33.0)
MCHC: 33.7 g/dL (ref 31.5–35.7)
MCV: 102 fL — ABNORMAL HIGH (ref 79–97)
Monocytes Absolute: 0.7 x10E3/uL (ref 0.1–0.9)
Monocytes: 12 %
Neutrophils Absolute: 3.5 x10E3/uL (ref 1.4–7.0)
Neutrophils: 63 %
Platelets: 179 x10E3/uL (ref 150–450)
RBC: 4.14 x10E6/uL (ref 4.14–5.80)
RDW: 13.2 % (ref 11.6–15.4)
WBC: 5.6 x10E3/uL (ref 3.4–10.8)

## 2024-04-09 LAB — CMP14+EGFR
ALT: 11 IU/L (ref 0–44)
AST: 12 IU/L (ref 0–40)
Albumin: 4.1 g/dL (ref 3.8–4.8)
Alkaline Phosphatase: 103 IU/L (ref 44–121)
BUN/Creatinine Ratio: 11 (ref 10–24)
BUN: 17 mg/dL (ref 8–27)
Bilirubin Total: 1 mg/dL (ref 0.0–1.2)
CO2: 25 mmol/L (ref 20–29)
Calcium: 9 mg/dL (ref 8.6–10.2)
Chloride: 99 mmol/L (ref 96–106)
Creatinine, Ser: 1.59 mg/dL — AB (ref 0.76–1.27)
Globulin, Total: 2.7 g/dL (ref 1.5–4.5)
Glucose: 102 mg/dL — AB (ref 70–99)
Potassium: 4.8 mmol/L (ref 3.5–5.2)
Sodium: 136 mmol/L (ref 134–144)
Total Protein: 6.8 g/dL (ref 6.0–8.5)
eGFR: 44 mL/min/1.73 — AB (ref >59)

## 2024-04-09 LAB — PSA, TOTAL AND FREE
PSA, Free Pct: 23.3
PSA, Free: 0.14 ng/mL
Prostate Specific Ag, Serum: 0.6 ng/mL (ref 0.0–4.0)

## 2024-04-10 ENCOUNTER — Encounter: Payer: Self-pay | Admitting: Family Medicine

## 2024-04-12 ENCOUNTER — Ambulatory Visit: Payer: Self-pay | Admitting: Family Medicine

## 2024-05-12 ENCOUNTER — Other Ambulatory Visit: Payer: Self-pay | Admitting: Family Medicine

## 2024-05-12 DIAGNOSIS — N401 Enlarged prostate with lower urinary tract symptoms: Secondary | ICD-10-CM

## 2024-05-18 ENCOUNTER — Ambulatory Visit: Admitting: Cardiology

## 2024-05-20 ENCOUNTER — Ambulatory Visit: Attending: Cardiology | Admitting: Cardiology

## 2024-05-20 ENCOUNTER — Encounter: Payer: Self-pay | Admitting: Cardiology

## 2024-05-20 VITALS — BP 110/62 | HR 46 | Ht 68.0 in | Wt 206.8 lb

## 2024-05-20 DIAGNOSIS — I4891 Unspecified atrial fibrillation: Secondary | ICD-10-CM | POA: Diagnosis not present

## 2024-05-20 DIAGNOSIS — I10 Essential (primary) hypertension: Secondary | ICD-10-CM | POA: Diagnosis present

## 2024-05-20 DIAGNOSIS — I5022 Chronic systolic (congestive) heart failure: Secondary | ICD-10-CM | POA: Insufficient documentation

## 2024-05-20 DIAGNOSIS — D6869 Other thrombophilia: Secondary | ICD-10-CM | POA: Insufficient documentation

## 2024-05-20 DIAGNOSIS — R1310 Dysphagia, unspecified: Secondary | ICD-10-CM | POA: Insufficient documentation

## 2024-05-20 MED ORDER — CARVEDILOL 6.25 MG PO TABS: 6.2500 mg | ORAL_TABLET | Freq: Two times a day (BID) | ORAL | 3 refills | Status: AC

## 2024-05-20 NOTE — Progress Notes (Signed)
 Clinical Summary Taylor Cross is a 78 y.o.male seen today for follow up of the following medical problems.   1. Chronic Systolic/Diastolic heart failure   - 10/2013 echo which showed LVEF 40-45%, mid-inferolateral wall hypokinesis, and grade II diastolic dysfunction.   - repeat echo 08/2014 LVEF 35-40%, basal to mid inferolateral and inferior walls, grade I diastolic dysfunction - completed lexiscan  which showed moderate sized moderate to severe intensity inferolateral wall defect and small apical to mid inferior wall defect. Both areas were fixed and consistent with scar.    - lisinopril   stopped by other provider due to decreasing renal function.    12/2018 echo: LVEF 50-55%  - admitted 06/2020 with CHF exacerbation and pneumonia - echo from 07/20/2020 shows that EF is down to 35 to 40% from 50 to 55% back in June 2020, 08/2020 echo LVEF 40-45%, grade I dd, normal RV  Low bp's and fatigue on coresg 25mg  bid, lowered to 12.5mg  bid    - elevation in Cr, off farxiga  and lasix  lowered. Cr was up to 1.9, more recently varying 1.5-1.7 - admit Jan 2025 with SOB, fluid overload - was diuresed, his home lasix  was increased to 60mg  daily - Jan 2025 echo: LVEF 40-45%, indet diastolic, normal RV   - no recent edema. Some SOB he relates with allergies and congestion.  - compliant with meds     2. CKD III - Cr has improved off ACE-I.        3. HL - 09/2021 TC 185 TG 145 HDL 50 LDL 109 03/2022 TC 163 TG 140 HDL 47 LDL 91 10/2022 TC 175 TG 201 HDL 50 LDL 91 04/2023 TC 149 TG 102 HDL 52 LDL 78 - 03/2024 TC 853 TG 82 HDL 53 LDL 77   4. HTN - he is compliant with meds       5. Mild aortic stenosis - Jan 2025 echo: LVEF 40-45%, AVA VTI 1.6, mean grad 10.   6. Afib - new diagnosis during Jan 2025 admission though not mentioned in discharge summary, noted at our hospital f/u upon review of hospital records.  - started on eliquis   EKG today shows afib rate 46 - no palpitatoins. Mild  dizziness at times. No bleeding on eliquis   7. Dysphagia - reports pill dysphagia, also sometime with food and liquid    Past Medical History:  Diagnosis Date   Acute respiratory failure with hypoxia and hypercapnia (HCC) 07/19/2020   Acute systolic CHF (congestive heart failure) (HCC) 07/19/2020   Arthritis    Combined systolic and diastolic heart failure (HCC)    GERD (gastroesophageal reflux disease)    Hyperlipidemia    Pneumonia      Allergies  Allergen Reactions   Neosporin [Neomycin-Bacitracin  Zn-Polymyx] Rash   Voltaren  [Diclofenac  Sodium] Other (See Comments)    GI upset     Current Outpatient Medications  Medication Sig Dispense Refill   acetaminophen  (TYLENOL ) 325 MG tablet Take 2 tablets (650 mg total) by mouth every 6 (six) hours as needed for mild pain, fever or headache (or Fever >/= 101). 12 tablet 0   allopurinol  (ZYLOPRIM ) 100 MG tablet Take 1 tablet (100 mg total) by mouth daily. 90 tablet 3   apixaban  (ELIQUIS ) 5 MG TABS tablet Take 1 tablet (5 mg total) by mouth 2 (two) times daily. 60 tablet 11   carvedilol  (COREG ) 12.5 MG tablet TAKE ONE TABLET TWICE DAILY 180 tablet 0   feeding supplement (ENSURE ENLIVE / ENSURE  PLUS) LIQD Take 237 mLs by mouth 2 (two) times daily between meals. 237 mL 12   finasteride  (PROSCAR ) 5 MG tablet Take 1 tablet (5 mg total) by mouth daily. 90 tablet 3   fluticasone  (FLONASE ) 50 MCG/ACT nasal spray USE 2 SPRAYS IN EACH NOSTRIL ONCE DAILY 16 g 4   furosemide  (LASIX ) 20 MG tablet Take 3 tablets (60 mg total) by mouth daily. 90 tablet 11   gabapentin  (NEURONTIN ) 300 MG capsule Take 1 capsule (300 mg total) by mouth 2 (two) times daily. 180 capsule 3   hydrALAZINE  (APRESOLINE ) 25 MG tablet TAKE 1/2 TABLET BY MOUTH TWICE DAILY 30 tablet 6   isosorbide  mononitrate (IMDUR ) 30 MG 24 hr tablet TAKE 1/2 TABLET BY MOUTH DAILY 45 tablet 2   pantoprazole  (PROTONIX ) 40 MG tablet Take 1 tablet (40 mg total) by mouth daily. TAKE 1 TABLET ONCE  DAILY FOR REFLUX 90 tablet 3   potassium chloride  SA (KLOR-CON  M) 10 MEQ tablet Take 2 tablets (20 mEq total) by mouth daily. 180 tablet 3   pravastatin  (PRAVACHOL ) 40 MG tablet TAKE ONE TABLET DAILY 90 tablet 1   sertraline  (ZOLOFT ) 50 MG tablet Take 1 tablet (50 mg total) by mouth daily. 90 tablet 3   tamsulosin  (FLOMAX ) 0.4 MG CAPS capsule TAKE 2 CAPSULES DAILY 180 capsule 1   No current facility-administered medications for this visit.     Past Surgical History:  Procedure Laterality Date   HERNIA REPAIR     TONSILLECTOMY       Allergies  Allergen Reactions   Neosporin [Neomycin-Bacitracin  Zn-Polymyx] Rash   Voltaren  [Diclofenac  Sodium] Other (See Comments)    GI upset      Family History  Problem Relation Age of Onset   Heart disease Mother    Diabetes Mother    Heart failure Mother    COPD Father    Heart attack Brother 66     Social History Taylor Cross reports that he has never smoked. He has never used smokeless tobacco. Taylor Cross reports no history of alcohol  use.    Physical Examination Today's Vitals   05/20/24 1442  BP: 110/62  Pulse: (!) 46  SpO2: 94%  Weight: 206 lb 12.8 oz (93.8 kg)  Height: 5' 8 (1.727 m)   Body mass index is 31.44 kg/m.  Gen: resting comfortably, no acute distress HEENT: no scleral icterus, pupils equal round and reactive, no palptable cervical adenopathy,  CV: irreg, brady Resp: Clear to auscultation bilaterally GI: abdomen is soft, non-tender, non-distended, normal bowel sounds, no hepatosplenomegaly MSK: extremities are warm, no edema.  Skin: warm, no rash Neuro:  no focal deficits Psych: appropriate affect   Diagnostic Studies 10/2013 Echo   Left ventricle: The cavity size was mildly dilated. Wall thickness was normal. Systolic function was mildly to moderately reduced. The estimated ejection fraction is 40%. Features are consistent with a pseudonormal left ventricular filling pattern, with concomitant  abnormal relaxation and increased filling pressure (grade 2 diastolic dysfunction). Doppler parameters are consistent with high ventricular filling pressure. - Regional wall motion abnormality: Mild to moderate hypokinesis of the mid inferolateral myocardium. The remaining walls are mildly hypokinetic. - Aortic valve: Trileaflet; mildly thickened, mildly calcified leaflets. There was no stenosis. - Mitral valve: Mildly thickened leaflets . Mild to moderate regurgitation. - Left atrium: The atrium was moderately dilated. - Tricuspid valve: Mild regurgitation. Inadequate TR jet to accurately assess pulmonary pressures. - Pericardium, extracardiac: There was a left pleural effusion.   11/11/13 Lexiscan   MPI   Gated imaging reveals an EDV of 223, ESV of 153, TID ratio 0.99, and LVEF of 31% with global hypokinesis, most prominent in the apex and inferolateral walls.  IMPRESSION: Intermediate risk Lexiscan  Cardiolite . No diagnostic ST segment changes were noted to indicate ischemia, frequent PVCs were seen without sustained arrhythmia. Perfusion imaging is most consistent with scar affecting the inferior wall and inferolateral wall as outlined, no large ischemic zones however. LVEF is calculated at 31% with global hypokinesis that is most prominent at the apex and inferior wall. EDV indicates moderate to severe chamber dilatation. Findings are consistent with probable ischemic cardiomyopathy.     08/2014 echo Study Conclusions  - Left ventricle: The cavity size was mildly dilated. Wall   thickness was increased in a pattern of moderate LVH. Systolic   function was moderately reduced. The estimated ejection fraction   was in the range of 35% to 40%. There is hypokinesis to akinesis   of the basal-mid inferolateral and inferior myocardium. Doppler   parameters are consistent with abnormal left ventricular   relaxation (grade 1 diastolic dysfunction). - Aortic valve: Mildly to  moderately calcified annulus. Trileaflet. - Mitral valve: Mildly calcified annulus. There was mild   regurgitation. - Left atrium: The atrium was mildly to moderately dilated. - Right atrium: Central venous pressure (est): 3 mm Hg. - Atrial septum: Possible PFO or secundum ASD with apparent left to   right flow noted by color Doppler. Cannot exclude vigorous venous   return adjacent to septum. Agitated saline study could be   considered for further evaluation. - Tricuspid valve: There was trivial regurgitation. - Pulmonary arteries: PA peak pressure: 17 mm Hg (S). - Pericardium, extracardiac: There was no pericardial effusion.  Recommendations:  Moderate LVH with mild LV chamber dilatation and LVEF approximately 35-40%. Hypokinesis to akinesis of the mid to basal inferolateral/inferior walls noted suggestive of ischemic cardiomyopathy. Grade 1 diastolic dysfunction. Compared to prior study in April 2015, wall motion has improved somewhat and ejection fraction looks to be higher, although graded in similar range last time. Mild to moderate left atrial enlargement. Mild MAC with mild mitral regurgitation. Possible PFO or secundum ASD with apparent left to right flow noted by color Doppler. Cannot exclude vigorous venous return adjacent to septum. Agitated saline study could be considered for further evaluation. Trivial tricuspid regurgitation with PASP normal range.             12/2018 echo 1. The left ventricle has low normal systolic function, with an ejection fraction of 50-55%. The cavity size was normal. There is mildly increased left ventricular wall thickness. Left ventricular diastolic Doppler parameters are consistent with  impaired relaxation. Elevated mean left atrial pressure.  2. The right ventricle has normal systolic function. The cavity was normal. There is no increase in right ventricular wall thickness.  3. Left atrial size was moderately dilated.  4. No evidence of  mitral valve stenosis.  5. The aortic valve is tricuspid. No stenosis of the aortic valve.  6. The aortic root is normal in size and structure.  7. Pulmonary hypertension is indeterminant, inadequate TR jet.     06/2020 echo IMPRESSIONS     1. Left ventricular ejection fraction, by estimation, is 35 to 40%. The  left ventricle has moderately decreased function. The left ventricle  demonstrates regional wall motion abnormalities (see scoring  diagram/findings for description). There is mild  left ventricular hypertrophy. Left ventricular diastolic parameters are  indeterminate.   2. Right  ventricular systolic function is normal. The right ventricular  size is normal. Tricuspid regurgitation signal is inadequate for assessing  PA pressure.   3. Left atrial size was mild to moderately dilated.   4. The mitral valve is grossly normal. Mild mitral valve regurgitation.   5. The aortic valve is tricuspid. There is moderate calcification of the  aortic valve. Aortic valve regurgitation is not visualized. Mild aortic  valve stenosis. Aortic valve mean gradient measures 10.0 mmHg.   6. The inferior vena cava is normal in size with greater than 50%  respiratory variability, suggesting right atrial pressure of 3 mmHg.   7. Left pleural effusion is noted.     08/2020 echo IMPRESSIONS     1. Left ventricular ejection fraction, by estimation, is 40 to 45%. The  left ventricle has mildly decreased function. The left ventricle  demonstrates global hypokinesis. There is mild left ventricular  hypertrophy. Left ventricular diastolic parameters  are consistent with Grade I diastolic dysfunction (impaired relaxation).   2. Right ventricular systolic function is normal. The right ventricular  size is normal.   3. Left atrial size was severely dilated.   4. The mitral valve is normal in structure. Trivial mitral valve  regurgitation. No evidence of mitral stenosis.   5. The aortic valve is  tricuspid. There is mild calcification of the  aortic valve. There is mild thickening of the aortic valve. Aortic valve  regurgitation is not visualized. No aortic stenosis is present.   6. The inferior vena cava is normal in size with greater than 50%  respiratory variability, suggesting right atrial pressure of 3 mmHg.      Jan 2025 echo 1. Left ventricular ejection fraction, by estimation, is 40 to 45%. The  left ventricle has mildly decreased function. The left ventricle  demonstrates global hypokinesis. There is mild left ventricular  hypertrophy. Left ventricular diastolic parameters  are indeterminate.   2. Right ventricular systolic function is normal. The right ventricular  size is normal. Tricuspid regurgitation signal is inadequate for assessing  PA pressure.   3. Left atrial size was moderately dilated.   4. Moderate pleural effusion in the left lateral region.   5. The mitral valve is abnormal. Mild mitral valve regurgitation. No  evidence of mitral stenosis.   6. The aortic valve has an indeterminant number of cusps. There is mild  calcification of the aortic valve. There is mild thickening of the aortic  valve. Aortic valve regurgitation is not visualized. Mild aortic valve  stenosis. Aortic valve area, by VTI  measures 1.62 cm. Aortic valve mean gradient measures 10.0 mmHg. Aortic  valve Vmax measures 1.99 m/s.     Assessment and Plan  1. Chronic Systolic/diastolic heart failure -Medical therapy has been limited due to orthostatic dizziness, renal dysfunction - history of CKD, prior AKI on ACEI has not been on ACE/ARB/ARNI. AKI on farxiga , was discontinued. Given CKD and very sensitive renal function to meds have not tried aldactone.  - some fluid overload. He reports most takes he takes 40mg  of lasix  as opposed to 60mg , will start taking the 60mg  more regularly - continue current meds   2. HTN -bp is at goal, continue current meds     3. Hyperlipidemia - at  goal, continue current meds     4.Persistent Afib/acquire thrombophilia - EKG today shows afib rate 46. Mild dizziness at times, no palpitations - lower coreg  to 6.25mg  bid - continue eliquis  for stroke prevention  5. Dysphagia -  reports primarily pill dysphagia, but also can happen with liquid and solid food - refer to GI      Dorn PHEBE Ross, M.D.,

## 2024-05-20 NOTE — Patient Instructions (Addendum)
 Medication Instructions:  Your physician has recommended you make the following change in your medication:  Decrease carvedilol  to 6.25 mg twice daily Continue all other medications as prescribed  Labwork: none  Testing/Procedures: none  Follow-Up: Your physician recommends that you schedule a follow-up appointment in: 4 months  Any Other Special Instructions Will Be Listed Below (If Applicable). You have been referred to a Gastroenterologist.  If you need a refill on your cardiac medications before your next appointment, please call your pharmacy.

## 2024-05-22 ENCOUNTER — Encounter (INDEPENDENT_AMBULATORY_CARE_PROVIDER_SITE_OTHER): Payer: Self-pay | Admitting: *Deleted

## 2024-06-08 ENCOUNTER — Other Ambulatory Visit: Payer: Self-pay | Admitting: Cardiology

## 2024-06-09 ENCOUNTER — Other Ambulatory Visit: Payer: Self-pay | Admitting: Family Medicine

## 2024-06-09 DIAGNOSIS — I10 Essential (primary) hypertension: Secondary | ICD-10-CM

## 2024-06-09 DIAGNOSIS — I5189 Other ill-defined heart diseases: Secondary | ICD-10-CM

## 2024-06-15 ENCOUNTER — Other Ambulatory Visit: Payer: Self-pay | Admitting: *Deleted

## 2024-06-16 ENCOUNTER — Encounter (INDEPENDENT_AMBULATORY_CARE_PROVIDER_SITE_OTHER): Payer: Self-pay | Admitting: Gastroenterology

## 2024-06-16 ENCOUNTER — Telehealth (INDEPENDENT_AMBULATORY_CARE_PROVIDER_SITE_OTHER): Payer: Self-pay | Admitting: *Deleted

## 2024-06-16 ENCOUNTER — Ambulatory Visit (INDEPENDENT_AMBULATORY_CARE_PROVIDER_SITE_OTHER): Admitting: Gastroenterology

## 2024-06-16 VITALS — BP 118/65 | HR 54 | Temp 98.0°F | Ht 68.0 in | Wt 198.2 lb

## 2024-06-16 DIAGNOSIS — R1319 Other dysphagia: Secondary | ICD-10-CM | POA: Insufficient documentation

## 2024-06-16 NOTE — Telephone Encounter (Signed)
  What type of surgery is being performed? EGD/DILATION  When is surgery scheduled? TBD  What type of clearance is required (medical or pharmacy to hold medication or both? MEDICATION  Are there any medications that need to be held prior to surgery and how long? ELIQUIS  X 2 DAYS PRIOR  Name of physician performing surgery?  Dr. CINDERELLA Rouse [facility] at [facility] Phone: [phone] Fax: [phone]  Anethesia type (none, local, MAC, general)? MAC

## 2024-06-16 NOTE — Progress Notes (Signed)
 Taylor Cross , M.D. Gastroenterology & Hepatology [facility] [address] [place], [place] [address] Primary Care Physician: Taylor Lowers, MD [address] [place] [address]  Chief Complaint: Solid food dysphagia  History of Present Illness: Taylor Cross is a 78 y.o. male with with history of CHF CKD, mild aortic stenosis, A-fib on Eliquis  who presents for evaluation of Solid food dysphagia  Patient reports that he has difficulty swallowing for past 1 year which have recently progressed and now he is having difficulty swallowing pills and some of his food.  Patient will get choked on foods such as meat potatoes and beans.  No history of food impactions The patient denies having any nausea, vomiting, fever, chills, hematochezia, melena, hematemesis, abdominal distention, abdominal pain, diarrhea, jaundice, pruritus or weight loss.  Last ZHI:wnwz  Last Colonoscopy: Years ago at OSH, and was told no further colonoscopies  FHx: neg for any gastrointestinal/liver disease, no malignancies Social: neg smoking, alcohol  or illicit drug use Surgical: Hernia repair  Labs from 03/2024 with normal liver enzymes creatinine 1.59 hemoglobin 14.3  Past Medical History: Past Medical History:  Diagnosis Date   Acute respiratory failure with hypoxia and hypercapnia (HCC) 07/19/2020   Acute systolic CHF (congestive heart failure) (HCC) 07/19/2020   Arthritis    Combined systolic and diastolic heart failure (HCC)    GERD (gastroesophageal reflux disease)    Hyperlipidemia    Pneumonia     Past Surgical History: Past Surgical History:  Procedure Laterality Date   HERNIA REPAIR     TONSILLECTOMY      Family History: Family History  Problem Relation Age of Onset   Heart disease Mother    Diabetes Mother    Heart failure Mother    COPD Father    Heart attack Brother 51    Social History: Social History   Tobacco Use  Smoking  Status Never  Smokeless Tobacco Never   Social History   Substance and Sexual Activity  Alcohol  Use No   Alcohol /week: 0.0 standard drinks of alcohol    Social History   Substance and Sexual Activity  Drug Use No    Allergies: Allergies  Allergen Reactions   Neosporin [Neomycin-Bacitracin  Zn-Polymyx] Rash   Voltaren  [Diclofenac  Sodium] Other (See Comments)    GI upset    Medications: Current Outpatient Medications  Medication Sig Dispense Refill   acetaminophen  (TYLENOL ) 325 MG tablet Take 2 tablets (650 mg total) by mouth every 6 (six) hours as needed for mild pain, fever or headache (or Fever >/= 101). 12 tablet 0   allopurinol  (ZYLOPRIM ) 100 MG tablet Take 1 tablet (100 mg total) by mouth daily. 90 tablet 3   apixaban  (ELIQUIS ) 5 MG TABS tablet Take 1 tablet (5 mg total) by mouth 2 (two) times daily. 60 tablet 11   carvedilol  (COREG ) 6.25 MG tablet Take 1 tablet (6.25 mg total) by mouth 2 (two) times daily. 180 tablet 3   feeding supplement (ENSURE ENLIVE / ENSURE PLUS) LIQD Take 237 mLs by mouth 2 (two) times daily between meals. 237 mL 12   finasteride  (PROSCAR ) 5 MG tablet Take 1 tablet (5 mg total) by mouth daily. 90 tablet 3   fluticasone  (FLONASE ) 50 MCG/ACT nasal spray USE 2 SPRAYS IN EACH NOSTRIL ONCE DAILY 16 g 4   furosemide  (LASIX ) 20 MG tablet Take 3 tablets (60 mg total) by mouth daily. 90 tablet 11   gabapentin  (NEURONTIN ) 300 MG capsule Take 1 capsule (  300 mg total) by mouth 2 (two) times daily. 180 capsule 3   hydrALAZINE  (APRESOLINE ) 25 MG tablet TAKE 1/2 TABLET BY MOUTH TWICE DAILY 30 tablet 11   isosorbide  mononitrate (IMDUR ) 30 MG 24 hr tablet TAKE 1/2 TABLET BY MOUTH DAILY 45 tablet 2   pantoprazole  (PROTONIX ) 40 MG tablet Take 1 tablet (40 mg total) by mouth daily. TAKE 1 TABLET ONCE DAILY FOR REFLUX 90 tablet 3   potassium chloride  SA (KLOR-CON  M) 10 MEQ tablet Take 2 tablets (20 mEq total) by mouth daily. 180 tablet 3   pravastatin  (PRAVACHOL ) 40 MG  tablet TAKE ONE TABLET DAILY 90 tablet 1   sertraline  (ZOLOFT ) 50 MG tablet Take 1 tablet (50 mg total) by mouth daily. 90 tablet 3   tamsulosin  (FLOMAX ) 0.4 MG CAPS capsule TAKE 2 CAPSULES DAILY 180 capsule 1   No current facility-administered medications for this visit.    Review of Systems: GENERAL: negative for malaise, night sweats HEENT: No changes in hearing or vision, no nose bleeds or other nasal problems. NECK: Negative for lumps, goiter, pain and significant neck swelling RESPIRATORY: Negative for cough, wheezing CARDIOVASCULAR: Negative for chest pain, leg swelling, palpitations, orthopnea GI: SEE HPI MUSCULOSKELETAL: Negative for joint pain or swelling, back pain, and muscle pain. SKIN: Negative for lesions, rash HEMATOLOGY Negative for prolonged bleeding, bruising easily, and swollen nodes. ENDOCRINE: Negative for cold or heat intolerance, polyuria, polydipsia and goiter. NEURO: negative for tremor, gait imbalance, syncope and seizures. The remainder of the review of systems is noncontributory.   Physical Exam: BP 118/65   Pulse (!) 54   Temp 98 F (36.7 C)   Ht 5' 8 (1.727 m)   Wt 198 lb 3.2 oz (89.9 kg)   BMI 30.14 kg/m  GENERAL: The patient is AO x3, in no acute distress. HEENT: Head is normocephalic and atraumatic. EOMI are intact. Mouth is well hydrated and without lesions. NECK: Supple. No masses LUNGS: Clear to auscultation. No presence of rhonchi/wheezing/rales. Adequate chest expansion HEART: RRR, normal s1 and s2. ABDOMEN: Soft, nontender, no guarding, no peritoneal signs, and nondistended. BS +. No masses.  Imaging/Labs: as above     Latest Ref Rng & Units 04/08/2024   12:20 PM 11/07/2023   11:16 AM 08/08/2023    4:43 AM  CBC  WBC 3.4 - 10.8 x10E3/uL 5.6  6.3  5.7   Hemoglobin 13.0 - 17.7 g/dL 85.6  86.5  86.7   Hematocrit 37.5 - 51.0 % 42.4  40.9  39.3   Platelets 150 - 450 x10E3/uL 179  205  188    Lab Results  Component Value Date   IRON  45 10/18/2021   TIBC 448 10/18/2021   FERRITIN 18 (L) 10/18/2021    I personally reviewed and interpreted the available labs, imaging and endoscopic files.  Impression and Plan:  Taylor Cross is a 78 y.o. male with with history of CHF CKD, mild aortic stenosis, A-fib on Eliquis  who presents for evaluation of Solid food dysphagia  #Progressive solid food dysphagia   Patient has progressive solid food dysphagia with pills and food this could be esophageal web ring or stricture but cannot rule out malignancy without upper endoscopy  Recommend upper endoscopy with dilation.  Will request cardiology clearance to hold Eliquis  for 2 days agrees prior to the procedure.  I recommend taking Protonix  40mg  , 30 min before breakfast   - Cut food in small pieces and chew food thoroughly to avoid regurgitation episodes.  -  Discussed avoidance of eating within 3 hours of lying down to sleep and benefit of blocks to elevate head of bed. Also, will benefit from avoiding carbonated drinks/sodas or food that has tomatoes, spicy or greasy food.  I thoroughly discussed with the patient the procedure, including the risks involved. Patient understands what the procedure involves including the benefits and any risks. Patient understands alternatives to the proposed procedure. Risks including (but not limited to) bleeding, tearing of the lining (perforation), rupture of adjacent organs, problems with heart and lung function, infection, and medication reactions. A small percentage of complications may require surgery, hospitalization, repeat endoscopic procedure, and/or transfusion.  Patient understood and agreed.    #Colon cancer screening  Patient is not interested in further elective colonoscopies.  Does not have any family history of colon cancer or personal history of any advanced polyp as per the patient  As per [place]   Multi-Society Task Force on Colorectal Cancer recommendation; For individuals ages 58 to 58,  the decision to start or continue screening should be individualized. Important considerations include prior screening history, life expectancy, CRC risk, and personal preference, prompting the need for shared decision-making with providers to weigh the risks and benefits of screening. CRC screening is not recommended after age 35.   All questions were answered.      Eliceo Gladu Faizan Jaritza Duignan, MD Gastroenterology and Hepatology [facility]   This chart has been completed using [facility] Dictation software, and while attempts have been made to ensure accuracy , certain words and phrases may not be transcribed as intended

## 2024-06-23 NOTE — Telephone Encounter (Signed)
 Patient with diagnosis of afib on Eliquis  for anticoagulation.    Procedure: EGD/DILATION  Date of procedure: TBD   CHA2DS2-VASc Score = 4   This indicates a 4.8% annual risk of stroke. The patient's score is based upon: CHF History: 1 HTN History: 1 Diabetes History: 0 Stroke History: 0 Vascular Disease History: 0 Age Score: 2 Gender Score: 0      CrCl 41.7 ml/min Platelet count 179  Patient has not had an Afib/aflutter ablation in the last 3 months, DCCV within the last 4 weeks or a watchman implanted in the last 45 days   Per office protocol, patient can hold Eliquis  for 2 days prior to procedure.    **This guidance is not considered finalized until pre-operative APP has relayed final recommendations.**

## 2024-06-23 NOTE — Telephone Encounter (Signed)
   Patient Name: Taylor Cross  DOB: 30-Apr-1946 MRN: [id number] Primary Cardiologist: Alvan Carrier, MD  Clinical pharmacists have reviewed the patient's past medical history, labs, and current medications as part of preoperative protocol coverage. The following recommendations have been made:  EGD/Dilation, date TBD   Patient has not had an Afib/aflutter ablation in the last 3 months, DCCV within the last 4 weeks or a watchman implanted in the last 45 days    Per office protocol, patient can hold Eliquis  for 2 days prior to procedure.  Please resume as soon as safe to do so from a bleeding standpoint.  I will route this recommendation to the requesting party via Epic fax function and remove from pre-op pool.  Please call with questions.  Asif Muchow D Rylie Knierim, NP 06/23/2024, 11:27 AM

## 2024-06-23 NOTE — Telephone Encounter (Signed)
 Called pt to schedule for EGD/ED with Dr. Cinderella, any room, but he did not want to schedule around the holiday's and Monday's were not a good date for him. Advised will call once we get January schedule to see if any dates then are better for him

## 2024-07-13 NOTE — Telephone Encounter (Signed)
 Spoke with patient, scheduled EGD/DIL for 07/31/2024 at 10:00am. Instructions sent to [facility] and mailed.

## 2024-07-13 NOTE — Telephone Encounter (Signed)
 LMOVM to call back to schedule with Dr. Cinderella EGD/ED, any room

## 2024-07-13 NOTE — Telephone Encounter (Signed)
 PA not required.

## 2024-07-31 ENCOUNTER — Ambulatory Visit (HOSPITAL_COMMUNITY)
Admission: RE | Admit: 2024-07-31 | Discharge: 2024-07-31 | Disposition: A | Discharge status: Home / Self Care | Attending: Gastroenterology | Admitting: Gastroenterology

## 2024-07-31 ENCOUNTER — Ambulatory Visit (HOSPITAL_COMMUNITY)

## 2024-07-31 ENCOUNTER — Encounter (HOSPITAL_COMMUNITY): Payer: Self-pay | Admitting: Gastroenterology

## 2024-07-31 ENCOUNTER — Other Ambulatory Visit: Payer: Self-pay

## 2024-07-31 ENCOUNTER — Encounter (HOSPITAL_COMMUNITY)
Admission: RE | Disposition: A | Discharge status: Home / Self Care | Payer: Self-pay | Source: Home / Self Care | Attending: Gastroenterology

## 2024-07-31 DIAGNOSIS — Q394 Esophageal web: Secondary | ICD-10-CM | POA: Diagnosis not present

## 2024-07-31 DIAGNOSIS — K3189 Other diseases of stomach and duodenum: Secondary | ICD-10-CM | POA: Insufficient documentation

## 2024-07-31 DIAGNOSIS — R1310 Dysphagia, unspecified: Secondary | ICD-10-CM | POA: Diagnosis present

## 2024-07-31 DIAGNOSIS — I110 Hypertensive heart disease with heart failure: Secondary | ICD-10-CM | POA: Diagnosis not present

## 2024-07-31 DIAGNOSIS — K2950 Unspecified chronic gastritis without bleeding: Secondary | ICD-10-CM | POA: Diagnosis not present

## 2024-07-31 DIAGNOSIS — I509 Heart failure, unspecified: Secondary | ICD-10-CM | POA: Diagnosis not present

## 2024-07-31 DIAGNOSIS — M1990 Unspecified osteoarthritis, unspecified site: Secondary | ICD-10-CM | POA: Insufficient documentation

## 2024-07-31 DIAGNOSIS — K2970 Gastritis, unspecified, without bleeding: Secondary | ICD-10-CM

## 2024-07-31 DIAGNOSIS — K222 Esophageal obstruction: Secondary | ICD-10-CM | POA: Insufficient documentation

## 2024-07-31 DIAGNOSIS — J449 Chronic obstructive pulmonary disease, unspecified: Secondary | ICD-10-CM | POA: Insufficient documentation

## 2024-07-31 DIAGNOSIS — K219 Gastro-esophageal reflux disease without esophagitis: Secondary | ICD-10-CM | POA: Diagnosis not present

## 2024-07-31 DIAGNOSIS — K449 Diaphragmatic hernia without obstruction or gangrene: Secondary | ICD-10-CM | POA: Diagnosis not present

## 2024-07-31 HISTORY — PX: HEMOSTASIS CLIP PLACEMENT: SHX6857

## 2024-07-31 HISTORY — PX: ESOPHAGEAL DILATION: SHX303

## 2024-07-31 HISTORY — PX: ESOPHAGOGASTRODUODENOSCOPY: SHX5428

## 2024-07-31 SURGERY — EGD (ESOPHAGOGASTRODUODENOSCOPY)
Anesthesia: General

## 2024-07-31 MED ORDER — PROPOFOL 500 MG/50ML IV EMUL
INTRAVENOUS | Status: DC | PRN
  Administered 2024-07-31: 100 ug/kg/min via INTRAVENOUS

## 2024-07-31 MED ORDER — LIDOCAINE HCL (CARDIAC) PF 100 MG/5ML IV SOSY
PREFILLED_SYRINGE | INTRAVENOUS | Status: DC | PRN
  Administered 2024-07-31: 80 mg via INTRAVENOUS

## 2024-07-31 MED ORDER — LACTATED RINGERS IV SOLN: INTRAVENOUS | Status: DC

## 2024-07-31 MED ORDER — LACTATED RINGERS IV SOLN: INTRAVENOUS | Status: DC | PRN

## 2024-07-31 MED ORDER — PROPOFOL 10 MG/ML IV BOLUS
INTRAVENOUS | Status: DC | PRN
  Administered 2024-07-31: 20 mg via INTRAVENOUS
  Administered 2024-07-31: 50 mg via INTRAVENOUS
  Administered 2024-07-31: 20 mg via INTRAVENOUS

## 2024-07-31 NOTE — Transfer of Care (Signed)
 Immediate Anesthesia Transfer of Care Note  Patient: Taylor Cross  Procedure(s) Performed: EGD (ESOPHAGOGASTRODUODENOSCOPY) DILATION, ESOPHAGUS CONTROL OF HEMORRHAGE, GI TRACT, ENDOSCOPIC, BY CLIPPING OR OVERSEWING  Patient Location: Endoscopy Unit  Anesthesia Type:General  Level of Consciousness: awake, alert , oriented, and patient cooperative  Airway & Oxygen Therapy: Patient Spontanous Breathing  Post-op Assessment: Report given to RN, Post -op Vital signs reviewed and stable, and Patient moving all extremities X 4  Post vital signs: Reviewed and stable  Last Vitals:  Vitals Value Taken Time  BP 89/48 07/31/24 10:10  Temp 36.7 C 07/31/24 10:10  Pulse 61 07/31/24 10:10  Resp 20 07/31/24 10:10  SpO2 92 % 07/31/24 10:10    Last Pain:  Vitals:   07/31/24 1010  TempSrc: Oral  PainSc:       Patients Stated Pain Goal: 5 (07/31/24 0839)  Complications: No notable events documented.

## 2024-07-31 NOTE — H&P (Signed)
 Primary Care Physician:  Zollie Lowers, MD Primary Gastroenterologist:  Dr. Cinderella  Pre-Procedure History & Physical: HPI:  Taylor Cross is a 79 y.o. male with with history of CHF CKD, mild aortic stenosis, A-fib on Eliquis  who presents for evaluation of Solid food dysphagia   Patient has progressive solid food dysphagia with pills and food this could be esophageal web ring or stricture but cannot rule out malignancy without upper endoscopy  Past Medical History:  Diagnosis Date   Acute respiratory failure with hypoxia and hypercapnia (HCC) 07/19/2020   Acute systolic CHF (congestive heart failure) (HCC) 07/19/2020   Arthritis    Combined systolic and diastolic heart failure (HCC)    GERD (gastroesophageal reflux disease)    Hyperlipidemia    Pneumonia     Past Surgical History:  Procedure Laterality Date   COLONOSCOPY     HERNIA REPAIR     TONSILLECTOMY      Prior to Admission medications  Medication Sig Start Date End Date Taking? Authorizing Provider  allopurinol  (ZYLOPRIM ) 100 MG tablet Take 1 tablet (100 mg total) by mouth daily. 11/07/23  Yes Zollie Lowers, MD  carvedilol  (COREG ) 6.25 MG tablet Take 1 tablet (6.25 mg total) by mouth 2 (two) times daily. 05/20/24  Yes BranchDorn FALCON, MD  finasteride  (PROSCAR ) 5 MG tablet Take 1 tablet (5 mg total) by mouth daily. 11/07/23  Yes Zollie Lowers, MD  fluticasone  (FLONASE ) 50 MCG/ACT nasal spray USE 2 SPRAYS IN EACH NOSTRIL ONCE DAILY 07/25/23  Yes Zollie Lowers, MD  furosemide  (LASIX ) 20 MG tablet Take 3 tablets (60 mg total) by mouth daily. 08/09/23 08/08/24 Yes Shah, Pratik D, DO  gabapentin  (NEURONTIN ) 300 MG capsule Take 1 capsule (300 mg total) by mouth 2 (two) times daily. 11/07/23  Yes [name], Lowers, MD  hydrALAZINE  (APRESOLINE ) 25 MG tablet TAKE 1/2 TABLET BY MOUTH TWICE DAILY 06/09/24  Yes Branch, Dorn FALCON, MD  isosorbide  mononitrate (IMDUR ) 30 MG 24 hr tablet TAKE 1/2 TABLET BY MOUTH DAILY 12/09/23  Yes Branch,  Dorn FALCON, MD  pantoprazole  (PROTONIX ) 40 MG tablet Take 1 tablet (40 mg total) by mouth daily. TAKE 1 TABLET ONCE DAILY FOR REFLUX 11/07/23  Yes [name], Lowers, MD  potassium chloride  SA (KLOR-CON  M) 10 MEQ tablet Take 2 tablets (20 mEq total) by mouth daily. 11/07/23  Yes [name], Lowers, MD  pravastatin  (PRAVACHOL ) 40 MG tablet TAKE ONE TABLET DAILY 06/15/24  Yes Zollie Lowers, MD  sertraline  (ZOLOFT ) 50 MG tablet Take 1 tablet (50 mg total) by mouth daily. 11/07/23  Yes Zollie Lowers, MD  tamsulosin  (FLOMAX ) 0.4 MG CAPS capsule TAKE 2 CAPSULES DAILY 05/13/24  Yes Zollie Lowers, MD  acetaminophen  (TYLENOL ) 325 MG tablet Take 2 tablets (650 mg total) by mouth every 6 (six) hours as needed for mild pain, fever or headache (or Fever >/= 101). 07/22/20   Pearlean Manus, MD  apixaban  (ELIQUIS ) 5 MG TABS tablet Take 1 tablet (5 mg total) by mouth 2 (two) times daily. 10/16/23   Alvan Dorn FALCON, MD  feeding supplement (ENSURE ENLIVE / ENSURE PLUS) LIQD Take 237 mLs by mouth 2 (two) times daily between meals. 08/09/23   Maree Bracken D, DO    Allergies as of 07/13/2024 - Review Complete 06/16/2024  Allergen Reaction Noted   Neosporin [neomycin-bacitracin  zn-polymyx] Rash 04/07/2013   Voltaren  [diclofenac  sodium] Other (See Comments) 10/06/2014    Family History  Problem Relation Age of Onset   Heart disease Mother    Diabetes Mother  Heart failure Mother    COPD Father    Heart attack Brother 79    Social History   Socioeconomic History   Marital status: Single    Spouse name: Not on file   Number of children: 0   Years of education: some college   Highest education level: Some college, no degree  Occupational History   Occupation: Retired    Comment: Probation Officer  Tobacco Use   Smoking status: Never   Smokeless tobacco: Never  Vaping Use   Vaping status: Never Used  Substance and Sexual Activity   Alcohol  use: No    Alcohol /week: 0.0 standard drinks of alcohol    Drug  use: No   Sexual activity: Not Currently  Other Topics Concern   Not on file  Social History Narrative   Not on file   Social Drivers of Health   Tobacco Use: Low Risk (07/31/2024)   Patient History    Smoking Tobacco Use: Never    Smokeless Tobacco Use: Never    Passive Exposure: Not on file  Financial Resource Strain: Low Risk (09/19/2023)   Overall Financial Resource Strain (CARDIA)    Difficulty of Paying Living Expenses: Not hard at all  Food Insecurity: No Food Insecurity (09/19/2023)   Hunger Vital Sign    Worried About Running Out of Food in the Last Year: Never true    Ran Out of Food in the Last Year: Never true  Transportation Needs: No Transportation Needs (09/19/2023)   PRAPARE - Administrator, Civil Service (Medical): No    Lack of Transportation (Non-Medical): No  Physical Activity: Insufficiently Active (09/19/2023)   Exercise Vital Sign    Days of Exercise per Week: 3 days    Minutes of Exercise per Session: 30 min  Stress: No Stress Concern Present (09/19/2023)   [employer] of Occupational Health - Occupational Stress Questionnaire    Feeling of Stress : Not at all  Social Connections: Socially Isolated (09/19/2023)   Social Connection and Isolation Panel    Frequency of Communication with Friends and Family: Once a week    Frequency of Social Gatherings with Friends and Family: Once a week    Attends Religious Services: More than 4 times per year    Active Member of [employer] or Organizations: No    Attends Banker Meetings: Never    Marital Status: Never married  Intimate Partner Violence: Not At Risk (09/19/2023)   Humiliation, Afraid, Rape, and Kick questionnaire    Fear of Current or Ex-Partner: No    Emotionally Abused: No    Physically Abused: No    Sexually Abused: No  Depression (PHQ2-9): Low Risk (04/08/2024)   Depression (PHQ2-9)    PHQ-2 Score: 0  Alcohol  Screen: Low Risk (09/19/2023)   Alcohol  Screen    Last Alcohol   Screening Score (AUDIT): 0  Housing: Low Risk (09/19/2023)   Housing Stability Vital Sign    Unable to Pay for Housing in the Last Year: No    Number of Times Moved in the Last Year: 0    Homeless in the Last Year: No  Utilities: Not At Risk (09/19/2023)   AHC Utilities    Threatened with loss of utilities: No  Health Literacy: Adequate Health Literacy (09/19/2023)   B1300 Health Literacy    Frequency of need for help with medical instructions: Never    Review of Systems: See HPI, otherwise negative ROS  Physical Exam: Vital signs in last 24 hours:  Temp:  [98.1 F (36.7 C)] 98.1 F (36.7 C) (01/02 0859) Pulse Rate:  [60] 60 (01/02 0859) Resp:  [15] 15 (01/02 0859) BP: (148)/(68) 148/68 (01/02 0859) SpO2:  [94 %] 94 % (01/02 0859) Weight:  [86.2 kg] 86.2 kg (01/02 0839)   General:   Alert,  Well-developed, well-nourished, pleasant and cooperative in NAD Head:  Normocephalic and atraumatic. Eyes:  Sclera clear, no icterus.   Conjunctiva pink. Ears:  Normal auditory acuity. Nose:  No deformity, discharge,  or lesions. Msk:  Symmetrical without gross deformities. Normal posture. Extremities:  Without clubbing or edema. Neurologic:  Alert and  oriented x4;  grossly normal neurologically. Skin:  Intact without significant lesions or rashes. Psych:  Alert and cooperative. Normal mood and affect.  Impression/Plan: ERON STAAT is a 79 y.o. male with with history of CHF CKD, mild aortic stenosis, A-fib on Eliquis  who presents for evaluation of Solid food dysphagia   Proceed with EGD with biopsies/dilation   The risks of the procedure including infection, bleed, or perforation as well as benefits, limitations, alternatives and imponderables have been reviewed with the patient. Questions have been answered. All parties agreeable.

## 2024-07-31 NOTE — Discharge Instructions (Addendum)
" °  Discharge instructions Please read the instructions outlined below and refer to this sheet in the next few weeks. These discharge instructions provide you with general information on caring for yourself after you leave the hospital. Your doctor may also give you specific instructions. While your treatment has been planned according to the most current medical practices available, unavoidable complications occasionally occur. If you have any problems or questions after discharge, please call your doctor. ACTIVITY You may resume your regular activity but move at a slower pace for the next 24 hours.  Take frequent rest periods for the next 24 hours.  Walking will help expel (get rid of) the air and reduce the bloated feeling in your abdomen.  No driving for 24 hours (because of the anesthesia (medicine) used during the test).  You may shower.  Do not sign any important legal documents or operate any machinery for 24 hours (because of the anesthesia used during the test).  NUTRITION Drink plenty of fluids.  You may resume your normal diet.  Begin with a light meal and progress to your normal diet.  Avoid alcoholic beverages for 24 hours or as instructed by your caregiver.  MEDICATIONS You may resume your normal medications unless your caregiver tells you otherwise.  WHAT YOU CAN EXPECT TODAY You may experience abdominal discomfort such as a feeling of fullness or gas pains.  FOLLOW-UP Your doctor will discuss the results of your test with you.  SEEK IMMEDIATE MEDICAL ATTENTION IF ANY OF THE FOLLOWING OCCUR: Excessive nausea (feeling sick to your stomach) and/or vomiting.  Severe abdominal pain and distention (swelling).  Trouble swallowing.  Temperature over 101 F (37.8 C).  Rectal bleeding or vomiting of blood.   Start your blood-thinner 08/01/2024 at 1030 am. Eat a soft diet today.      I hope you have a great rest of your week!   Muhammad Faizan Ahmed , M.D.. Gastroenterology and  Hepatology [facility]    "

## 2024-07-31 NOTE — Op Note (Signed)
 [facility] Patient Name: Taylor Cross Procedure Date: 07/31/2024 9:42 AM MRN: [id number] Date of Birth: 1946-02-21 Attending MD: Deatrice Dine , MD, [id number] CSN: [id number] Age: 79 Admit Type: Outpatient Procedure:                Upper GI endoscopy Indications:              Dysphagia Providers:                Deatrice Dine, MD, Rosina Sprague, Daphne Mulch                            Technician, Technician Referring MD:              Medicines:                Monitored Anesthesia Care Complications:            No immediate complications. Estimated blood loss:                            None. Estimated Blood Loss:     Estimated blood loss was minimal. Procedure:                Pre-Anesthesia Assessment:                           - Prior to the procedure, a History and Physical                            was performed, and patient medications and                            allergies were reviewed. The patient's tolerance of                            previous anesthesia was also reviewed. The risks                            and benefits of the procedure and the sedation                            options and risks were discussed with the patient.                            All questions were answered, and informed consent                            was obtained. Prior Anticoagulants: The patient has                            taken Eliquis  (apixaban ), last dose was 2 days                            prior to procedure. ASA Grade Assessment: III - A  patient with severe systemic disease. After                            reviewing the risks and benefits, the patient was                            deemed in satisfactory condition to undergo the                            procedure.                           After obtaining informed consent, the endoscope was                            passed under direct vision. Throughout the                             procedure, the patient's blood pressure, pulse, and                            oxygen saturations were monitored continuously. The                            Endoscope was introduced through the mouth, and                            advanced to the second part of duodenum. The upper                            GI endoscopy was accomplished without difficulty.                            The patient tolerated the procedure well. 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 A TTS dilator was       passed through the scope. Dilation with a 15-16.5-18 mm balloon dilator       was performed to 15 mm. The dilation site was examined following       endoscope reinsertion and showed moderate mucosal disruption.      One benign-appearing, intrinsic moderate (circumferential scarring or       stenosis; an endoscope may pass) stenosis was found at the       gastroesophageal junction. This stenosis measured 1.4 cm (inner       diameter) x less than one cm (in length). The stenosis was traversed. A       TTS dilator was passed through the scope. Dilation with a 15-16.5-18 mm       balloon dilator was performed to 15 mm. The dilation site was examined       following endoscope reinsertion and showed moderate mucosal disruption       and complete resolution of luminal narrowing. For hemostasis, one       hemostatic clip was successfully placed (MR conditional). Clip  manufacturer: [employer]. There was no bleeding at the end of the       procedure. Biopsies were obtained from the proximal and distal esophagus       with cold forceps for histology of suspected eosinophilic esophagitis.      A 2 cm hiatal hernia was present.      Mildly erythematous mucosa without bleeding was found in the gastric       antrum. Biopsies were taken with a cold forceps for histology.      The  duodenal bulb and second portion of the duodenum were normal. Impression:               - Web in the upper third of the esophagus. Dilated.                           - Benign-appearing esophageal stenosis. Dilated.                            Clip (MR conditional) was placed to stop bleeding.                            Clip manufacturer: [employer]. No perforation                           - 2 cm hiatal hernia.                           - Erythematous mucosa in the antrum. Biopsied.                           - Normal duodenal bulb and second portion of the                            duodenum.                           - Biopsies were taken with a cold forceps for                            evaluation of eosinophilic esophagitis. Moderate Sedation:      Per Anesthesia Care Recommendation:           - Patient has a contact number available for                            emergencies. The signs and symptoms of potential                            delayed complications were discussed with the                            patient. Return to normal activities tomorrow.                            Written discharge instructions were provided to the  patient.                           - Resume previous diet.                           - Continue present medications.                           - Await pathology results.                           - Repeat upper endoscopy PRN.                           - Return to GI clinic as previously scheduled.                           -PPI daily Procedure Code(s):        --- Professional ---                           43255, 59, Esophagogastroduodenoscopy, flexible,                            transoral; with control of bleeding, any method                           43249, Esophagogastroduodenoscopy, flexible,                            transoral; with transendoscopic balloon dilation of                            esophagus (less than  30 mm diameter)                           43239, 59, Esophagogastroduodenoscopy, flexible,                            transoral; with biopsy, single or multiple Diagnosis Code(s):        --- Professional ---                           Q39.4, Esophageal web                           K22.2, Esophageal obstruction                           K44.9, Diaphragmatic hernia without obstruction or                            gangrene                           K31.89, Other diseases of stomach and duodenum  R13.10, Dysphagia, unspecified CPT copyright 2022 American Medical Association. All rights reserved. The codes documented in this report are preliminary and upon coder review may  be revised to meet current compliance requirements. Deatrice Dine, MD Deatrice Dine, MD 07/31/2024 10:16:37 AM This report has been signed electronically. Number of Addenda: 0

## 2024-07-31 NOTE — Anesthesia Preprocedure Evaluation (Addendum)
"                                    Anesthesia Evaluation  Patient identified by MRN, date of birth, ID band Patient awake    Reviewed: Allergy & Precautions, H&P , NPO status , Patient's Chart, lab work & pertinent test results  Airway Mallampati: II  TM Distance: >3 FB Neck ROM: Full    Dental no notable dental hx.    Pulmonary pneumonia, resolved, COPD   Pulmonary exam normal breath sounds clear to auscultation       Cardiovascular hypertension, +CHF  Normal cardiovascular exam Rhythm:Irregular Rate:Normal  EF 40-45%   Neuro/Psych negative neurological ROS  negative psych ROS   GI/Hepatic Neg liver ROS,GERD  ,,  Endo/Other  negative endocrine ROS    Renal/GU negative Renal ROS  negative genitourinary   Musculoskeletal  (+) Arthritis ,    Abdominal   Peds negative pediatric ROS (+)  Hematology negative hematology ROS (+)   Anesthesia Other Findings   Reproductive/Obstetrics negative OB ROS                              Anesthesia Physical Anesthesia Plan  ASA: 4  Anesthesia Plan: General   Post-op Pain Management:    Induction: Intravenous  PONV Risk Score and Plan:   Airway Management Planned: Nasal Cannula  Additional Equipment:   Intra-op Plan:   Post-operative Plan:   Informed Consent: I have reviewed the patients History and Physical, chart, labs and discussed the procedure including the risks, benefits and alternatives for the proposed anesthesia with the patient or authorized representative who has indicated his/her understanding and acceptance.     Dental advisory given  Plan Discussed with: CRNA  Anesthesia Plan Comments: (Low EF 40-45%)         Anesthesia Quick Evaluation  "

## 2024-07-31 NOTE — Anesthesia Postprocedure Evaluation (Signed)
"   Anesthesia Post Note  Patient: Taylor Cross  Procedure(s) Performed: EGD (ESOPHAGOGASTRODUODENOSCOPY) DILATION, ESOPHAGUS CONTROL OF HEMORRHAGE, GI TRACT, ENDOSCOPIC, BY CLIPPING OR OVERSEWING  Patient location during evaluation: PACU Anesthesia Type: General Level of consciousness: awake and alert Pain management: pain level controlled Vital Signs Assessment: post-procedure vital signs reviewed and stable Respiratory status: spontaneous breathing, nonlabored ventilation, respiratory function stable and patient connected to nasal cannula oxygen Cardiovascular status: blood pressure returned to baseline and stable Postop Assessment: no apparent nausea or vomiting Anesthetic complications: no   No notable events documented.   Last Vitals:  Vitals:   07/31/24 0859 07/31/24 1010  BP: (!) 148/68 (!) 89/48  Pulse: 60 61  Resp: 15 20  Temp: 36.7 C 36.7 C  SpO2: 94% 92%    Last Pain:  Vitals:   07/31/24 1010  TempSrc: Oral  PainSc:                  Andrea Limes      "

## 2024-08-03 ENCOUNTER — Ambulatory Visit (INDEPENDENT_AMBULATORY_CARE_PROVIDER_SITE_OTHER): Payer: Self-pay | Admitting: Gastroenterology

## 2024-08-03 ENCOUNTER — Encounter (HOSPITAL_COMMUNITY): Payer: Self-pay | Admitting: Gastroenterology

## 2024-08-03 LAB — SURGICAL PATHOLOGY

## 2024-08-04 NOTE — Progress Notes (Signed)
 Patient result letter mailed procedure note and pathology result faxed to PCP

## 2024-08-28 ENCOUNTER — Other Ambulatory Visit: Payer: Self-pay | Admitting: Cardiology

## 2024-09-21 ENCOUNTER — Ambulatory Visit

## 2024-09-22 ENCOUNTER — Ambulatory Visit: Payer: Self-pay

## 2024-09-24 ENCOUNTER — Ambulatory Visit: Admitting: Cardiology

## 2024-10-06 ENCOUNTER — Ambulatory Visit: Payer: Self-pay | Admitting: Family Medicine
